# Patient Record
Sex: Female | Born: 1940 | Race: White | Hispanic: No
Health system: Southern US, Community
[De-identification: ages and names within clinical notes are randomized; demographics above are authoritative.]

## PROBLEM LIST (undated history)

## (undated) DIAGNOSIS — F329 Major depressive disorder, single episode, unspecified: Secondary | ICD-10-CM

## (undated) DIAGNOSIS — G2401 Drug induced subacute dyskinesia: Secondary | ICD-10-CM

## (undated) DIAGNOSIS — F603 Borderline personality disorder: Secondary | ICD-10-CM

## (undated) DIAGNOSIS — I2510 Atherosclerotic heart disease of native coronary artery without angina pectoris: Secondary | ICD-10-CM

## (undated) DIAGNOSIS — I2699 Other pulmonary embolism without acute cor pulmonale: Secondary | ICD-10-CM

## (undated) DIAGNOSIS — I82409 Acute embolism and thrombosis of unspecified deep veins of unspecified lower extremity: Secondary | ICD-10-CM

## (undated) DIAGNOSIS — R0602 Shortness of breath: Secondary | ICD-10-CM

## (undated) DIAGNOSIS — F419 Anxiety disorder, unspecified: Secondary | ICD-10-CM

## (undated) DIAGNOSIS — G629 Polyneuropathy, unspecified: Secondary | ICD-10-CM

## (undated) DIAGNOSIS — Z87448 Personal history of other diseases of urinary system: Secondary | ICD-10-CM

## (undated) DIAGNOSIS — D649 Anemia, unspecified: Secondary | ICD-10-CM

## (undated) DIAGNOSIS — M1990 Unspecified osteoarthritis, unspecified site: Secondary | ICD-10-CM

## (undated) DIAGNOSIS — I10 Essential (primary) hypertension: Secondary | ICD-10-CM

## (undated) DIAGNOSIS — J449 Chronic obstructive pulmonary disease, unspecified: Secondary | ICD-10-CM

## (undated) DIAGNOSIS — F32A Depression, unspecified: Secondary | ICD-10-CM

## (undated) DIAGNOSIS — K219 Gastro-esophageal reflux disease without esophagitis: Secondary | ICD-10-CM

## (undated) DIAGNOSIS — E669 Obesity, unspecified: Secondary | ICD-10-CM

## (undated) HISTORY — PX: TONSILLECTOMY: SUR1361

## (undated) HISTORY — PX: APPENDECTOMY: SHX54

## (undated) HISTORY — DX: Polyneuropathy, unspecified: G62.9

## (undated) HISTORY — DX: Other pulmonary embolism without acute cor pulmonale: I26.99

## (undated) HISTORY — DX: Acute embolism and thrombosis of unspecified deep veins of unspecified lower extremity: I82.409

## (undated) HISTORY — PX: ORIF ANKLE FRACTURE: SUR919

## (undated) HISTORY — PX: CERVICAL DISCECTOMY: SHX98

---

## 1998-01-18 ENCOUNTER — Encounter: Admission: RE | Admit: 1998-01-18 | Discharge: 1998-01-18 | Payer: Self-pay | Admitting: Family Medicine

## 1998-01-31 ENCOUNTER — Encounter: Admission: RE | Admit: 1998-01-31 | Discharge: 1998-01-31 | Payer: Self-pay | Admitting: Family Medicine

## 1998-02-09 ENCOUNTER — Encounter: Admission: RE | Admit: 1998-02-09 | Discharge: 1998-02-09 | Payer: Self-pay | Admitting: Sports Medicine

## 1998-02-28 ENCOUNTER — Encounter: Admission: RE | Admit: 1998-02-28 | Discharge: 1998-02-28 | Payer: Self-pay | Admitting: Family Medicine

## 1998-03-02 ENCOUNTER — Encounter: Admission: RE | Admit: 1998-03-02 | Discharge: 1998-03-02 | Payer: Self-pay | Admitting: Family Medicine

## 1998-03-27 ENCOUNTER — Encounter: Admission: RE | Admit: 1998-03-27 | Discharge: 1998-03-27 | Payer: Self-pay | Admitting: Family Medicine

## 1998-04-03 ENCOUNTER — Encounter: Admission: RE | Admit: 1998-04-03 | Discharge: 1998-04-03 | Payer: Self-pay | Admitting: Family Medicine

## 1998-04-24 ENCOUNTER — Encounter: Admission: RE | Admit: 1998-04-24 | Discharge: 1998-04-24 | Payer: Self-pay | Admitting: Family Medicine

## 1998-05-01 ENCOUNTER — Encounter: Admission: RE | Admit: 1998-05-01 | Discharge: 1998-05-01 | Payer: Self-pay | Admitting: Family Medicine

## 1998-05-22 ENCOUNTER — Encounter: Admission: RE | Admit: 1998-05-22 | Discharge: 1998-05-22 | Payer: Self-pay | Admitting: Family Medicine

## 1998-05-22 ENCOUNTER — Ambulatory Visit (HOSPITAL_COMMUNITY): Admission: RE | Admit: 1998-05-22 | Discharge: 1998-05-22 | Payer: Self-pay | Admitting: *Deleted

## 1998-05-26 ENCOUNTER — Encounter: Admission: RE | Admit: 1998-05-26 | Discharge: 1998-05-26 | Payer: Self-pay | Admitting: Family Medicine

## 1998-05-29 ENCOUNTER — Encounter: Admission: RE | Admit: 1998-05-29 | Discharge: 1998-05-29 | Payer: Self-pay | Admitting: Sports Medicine

## 1998-05-29 ENCOUNTER — Emergency Department (HOSPITAL_COMMUNITY): Admission: EM | Admit: 1998-05-29 | Discharge: 1998-05-29 | Payer: Self-pay | Admitting: *Deleted

## 1998-05-29 ENCOUNTER — Encounter: Payer: Self-pay | Admitting: *Deleted

## 1998-05-29 ENCOUNTER — Emergency Department (HOSPITAL_COMMUNITY): Admission: EM | Admit: 1998-05-29 | Discharge: 1998-05-29 | Payer: Self-pay | Admitting: Emergency Medicine

## 1998-05-30 ENCOUNTER — Inpatient Hospital Stay (HOSPITAL_COMMUNITY): Admission: EM | Admit: 1998-05-30 | Discharge: 1998-06-01 | Payer: Self-pay | Admitting: Emergency Medicine

## 1998-06-01 ENCOUNTER — Inpatient Hospital Stay (HOSPITAL_COMMUNITY): Admission: AD | Admit: 1998-06-01 | Discharge: 1998-06-09 | Payer: Self-pay | Admitting: *Deleted

## 1998-06-15 ENCOUNTER — Encounter: Admission: RE | Admit: 1998-06-15 | Discharge: 1998-06-15 | Payer: Self-pay | Admitting: Family Medicine

## 1998-08-03 ENCOUNTER — Encounter: Admission: RE | Admit: 1998-08-03 | Discharge: 1998-08-03 | Payer: Self-pay | Admitting: Family Medicine

## 1998-08-15 ENCOUNTER — Encounter: Admission: RE | Admit: 1998-08-15 | Discharge: 1998-08-15 | Payer: Self-pay | Admitting: Sports Medicine

## 1998-08-30 ENCOUNTER — Encounter: Admission: RE | Admit: 1998-08-30 | Discharge: 1998-08-30 | Payer: Self-pay | Admitting: Family Medicine

## 1998-09-21 ENCOUNTER — Encounter: Admission: RE | Admit: 1998-09-21 | Discharge: 1998-09-21 | Payer: Self-pay | Admitting: Family Medicine

## 1998-10-10 ENCOUNTER — Encounter: Admission: RE | Admit: 1998-10-10 | Discharge: 1998-10-10 | Payer: Self-pay | Admitting: Family Medicine

## 1998-11-01 ENCOUNTER — Encounter: Admission: RE | Admit: 1998-11-01 | Discharge: 1998-11-01 | Payer: Self-pay | Admitting: Family Medicine

## 1998-11-27 ENCOUNTER — Encounter: Admission: RE | Admit: 1998-11-27 | Discharge: 1998-11-27 | Payer: Self-pay | Admitting: Family Medicine

## 1998-12-11 ENCOUNTER — Encounter: Admission: RE | Admit: 1998-12-11 | Discharge: 1998-12-11 | Payer: Self-pay | Admitting: Family Medicine

## 1999-01-01 ENCOUNTER — Encounter: Admission: RE | Admit: 1999-01-01 | Discharge: 1999-01-01 | Payer: Self-pay | Admitting: Sports Medicine

## 1999-01-15 ENCOUNTER — Encounter: Admission: RE | Admit: 1999-01-15 | Discharge: 1999-01-15 | Payer: Self-pay | Admitting: Family Medicine

## 1999-02-06 ENCOUNTER — Encounter: Admission: RE | Admit: 1999-02-06 | Discharge: 1999-02-06 | Payer: Self-pay | Admitting: Family Medicine

## 1999-02-28 ENCOUNTER — Encounter: Admission: RE | Admit: 1999-02-28 | Discharge: 1999-02-28 | Payer: Self-pay | Admitting: Family Medicine

## 1999-03-23 ENCOUNTER — Encounter: Admission: RE | Admit: 1999-03-23 | Discharge: 1999-03-23 | Payer: Self-pay | Admitting: Family Medicine

## 1999-04-09 ENCOUNTER — Encounter: Admission: RE | Admit: 1999-04-09 | Discharge: 1999-04-09 | Payer: Self-pay | Admitting: Family Medicine

## 1999-04-30 ENCOUNTER — Encounter: Admission: RE | Admit: 1999-04-30 | Discharge: 1999-04-30 | Payer: Self-pay | Admitting: Family Medicine

## 1999-05-21 ENCOUNTER — Encounter: Admission: RE | Admit: 1999-05-21 | Discharge: 1999-05-21 | Payer: Self-pay | Admitting: Family Medicine

## 1999-06-11 ENCOUNTER — Encounter: Admission: RE | Admit: 1999-06-11 | Discharge: 1999-06-11 | Payer: Self-pay | Admitting: Family Medicine

## 1999-06-27 ENCOUNTER — Encounter: Admission: RE | Admit: 1999-06-27 | Discharge: 1999-06-27 | Payer: Self-pay | Admitting: Family Medicine

## 1999-07-11 ENCOUNTER — Encounter: Admission: RE | Admit: 1999-07-11 | Discharge: 1999-07-11 | Payer: Self-pay | Admitting: Family Medicine

## 1999-08-01 ENCOUNTER — Encounter: Admission: RE | Admit: 1999-08-01 | Discharge: 1999-08-01 | Payer: Self-pay | Admitting: Family Medicine

## 1999-08-24 ENCOUNTER — Encounter: Admission: RE | Admit: 1999-08-24 | Discharge: 1999-08-24 | Payer: Self-pay | Admitting: Family Medicine

## 1999-09-11 ENCOUNTER — Encounter: Admission: RE | Admit: 1999-09-11 | Discharge: 1999-09-11 | Payer: Self-pay | Admitting: Family Medicine

## 1999-09-19 ENCOUNTER — Encounter: Admission: RE | Admit: 1999-09-19 | Discharge: 1999-09-19 | Payer: Self-pay | Admitting: Family Medicine

## 1999-10-10 ENCOUNTER — Encounter: Admission: RE | Admit: 1999-10-10 | Discharge: 1999-10-10 | Payer: Self-pay | Admitting: Sports Medicine

## 1999-10-10 ENCOUNTER — Encounter: Payer: Self-pay | Admitting: Sports Medicine

## 1999-10-17 ENCOUNTER — Encounter: Admission: RE | Admit: 1999-10-17 | Discharge: 1999-10-17 | Payer: Self-pay | Admitting: Family Medicine

## 1999-10-23 ENCOUNTER — Encounter: Admission: RE | Admit: 1999-10-23 | Discharge: 1999-10-23 | Payer: Self-pay | Admitting: Sports Medicine

## 1999-10-29 ENCOUNTER — Encounter: Admission: RE | Admit: 1999-10-29 | Discharge: 1999-10-29 | Payer: Self-pay | Admitting: Family Medicine

## 1999-11-06 ENCOUNTER — Encounter: Admission: RE | Admit: 1999-11-06 | Discharge: 1999-11-06 | Payer: Self-pay | Admitting: Sports Medicine

## 1999-11-21 ENCOUNTER — Encounter: Admission: RE | Admit: 1999-11-21 | Discharge: 1999-11-21 | Payer: Self-pay | Admitting: Family Medicine

## 1999-11-27 ENCOUNTER — Encounter: Admission: RE | Admit: 1999-11-27 | Discharge: 1999-11-27 | Payer: Self-pay | Admitting: Sports Medicine

## 2000-01-17 ENCOUNTER — Encounter: Admission: RE | Admit: 2000-01-17 | Discharge: 2000-01-17 | Payer: Self-pay | Admitting: Family Medicine

## 2000-02-01 ENCOUNTER — Encounter: Admission: RE | Admit: 2000-02-01 | Discharge: 2000-02-01 | Payer: Self-pay | Admitting: Family Medicine

## 2000-02-21 ENCOUNTER — Encounter: Admission: RE | Admit: 2000-02-21 | Discharge: 2000-02-21 | Payer: Self-pay | Admitting: Family Medicine

## 2000-02-21 ENCOUNTER — Encounter: Admission: RE | Admit: 2000-02-21 | Discharge: 2000-02-21 | Payer: Self-pay | Admitting: Sports Medicine

## 2000-02-21 ENCOUNTER — Encounter: Payer: Self-pay | Admitting: Sports Medicine

## 2000-02-22 ENCOUNTER — Encounter: Admission: RE | Admit: 2000-02-22 | Discharge: 2000-02-22 | Payer: Self-pay | Admitting: Family Medicine

## 2000-03-14 ENCOUNTER — Encounter: Admission: RE | Admit: 2000-03-14 | Discharge: 2000-03-14 | Payer: Self-pay | Admitting: Family Medicine

## 2000-04-03 ENCOUNTER — Encounter: Admission: RE | Admit: 2000-04-03 | Discharge: 2000-04-03 | Payer: Self-pay | Admitting: Family Medicine

## 2000-04-24 ENCOUNTER — Encounter: Admission: RE | Admit: 2000-04-24 | Discharge: 2000-04-24 | Payer: Self-pay | Admitting: Family Medicine

## 2000-05-19 ENCOUNTER — Encounter: Admission: RE | Admit: 2000-05-19 | Discharge: 2000-05-19 | Payer: Self-pay | Admitting: Family Medicine

## 2000-06-09 ENCOUNTER — Encounter: Admission: RE | Admit: 2000-06-09 | Discharge: 2000-06-09 | Payer: Self-pay | Admitting: Sports Medicine

## 2000-06-14 ENCOUNTER — Encounter (INDEPENDENT_AMBULATORY_CARE_PROVIDER_SITE_OTHER): Payer: Self-pay | Admitting: *Deleted

## 2000-06-14 LAB — CONVERTED CEMR LAB

## 2000-06-24 ENCOUNTER — Encounter: Admission: RE | Admit: 2000-06-24 | Discharge: 2000-06-24 | Payer: Self-pay | Admitting: Family Medicine

## 2000-07-15 ENCOUNTER — Encounter: Admission: RE | Admit: 2000-07-15 | Discharge: 2000-07-15 | Payer: Self-pay | Admitting: *Deleted

## 2000-07-15 ENCOUNTER — Encounter: Payer: Self-pay | Admitting: *Deleted

## 2000-07-15 ENCOUNTER — Encounter: Admission: RE | Admit: 2000-07-15 | Discharge: 2000-07-15 | Payer: Self-pay | Admitting: Family Medicine

## 2000-07-22 ENCOUNTER — Encounter: Admission: RE | Admit: 2000-07-22 | Discharge: 2000-07-22 | Payer: Self-pay | Admitting: Family Medicine

## 2000-08-01 ENCOUNTER — Encounter: Admission: RE | Admit: 2000-08-01 | Discharge: 2000-08-01 | Payer: Self-pay | Admitting: Family Medicine

## 2000-08-14 ENCOUNTER — Encounter: Admission: RE | Admit: 2000-08-14 | Discharge: 2000-08-14 | Payer: Self-pay | Admitting: Family Medicine

## 2000-08-28 ENCOUNTER — Encounter: Admission: RE | Admit: 2000-08-28 | Discharge: 2000-08-28 | Payer: Self-pay | Admitting: Family Medicine

## 2000-09-11 ENCOUNTER — Encounter: Admission: RE | Admit: 2000-09-11 | Discharge: 2000-09-11 | Payer: Self-pay | Admitting: Family Medicine

## 2000-09-25 ENCOUNTER — Encounter: Admission: RE | Admit: 2000-09-25 | Discharge: 2000-09-25 | Payer: Self-pay | Admitting: Family Medicine

## 2000-10-10 ENCOUNTER — Encounter: Admission: RE | Admit: 2000-10-10 | Discharge: 2000-10-10 | Payer: Self-pay | Admitting: Family Medicine

## 2000-10-13 ENCOUNTER — Encounter: Admission: RE | Admit: 2000-10-13 | Discharge: 2000-10-13 | Payer: Self-pay | Admitting: *Deleted

## 2000-10-31 ENCOUNTER — Encounter: Admission: RE | Admit: 2000-10-31 | Discharge: 2000-10-31 | Payer: Self-pay | Admitting: Family Medicine

## 2000-10-31 ENCOUNTER — Ambulatory Visit (HOSPITAL_COMMUNITY): Admission: RE | Admit: 2000-10-31 | Discharge: 2000-10-31 | Payer: Self-pay | Admitting: Family Medicine

## 2000-11-12 ENCOUNTER — Encounter: Admission: RE | Admit: 2000-11-12 | Discharge: 2000-11-12 | Payer: Self-pay | Admitting: Family Medicine

## 2000-11-27 ENCOUNTER — Encounter: Admission: RE | Admit: 2000-11-27 | Discharge: 2000-11-27 | Payer: Self-pay | Admitting: Family Medicine

## 2000-12-08 ENCOUNTER — Encounter: Admission: RE | Admit: 2000-12-08 | Discharge: 2000-12-08 | Payer: Self-pay | Admitting: *Deleted

## 2000-12-08 ENCOUNTER — Encounter: Admission: RE | Admit: 2000-12-08 | Discharge: 2000-12-08 | Payer: Self-pay | Admitting: Family Medicine

## 2000-12-10 ENCOUNTER — Encounter: Admission: RE | Admit: 2000-12-10 | Discharge: 2000-12-10 | Payer: Self-pay | Admitting: Family Medicine

## 2000-12-19 ENCOUNTER — Encounter: Admission: RE | Admit: 2000-12-19 | Discharge: 2000-12-19 | Payer: Self-pay | Admitting: Family Medicine

## 2001-01-08 ENCOUNTER — Encounter: Admission: RE | Admit: 2001-01-08 | Discharge: 2001-01-08 | Payer: Self-pay | Admitting: Family Medicine

## 2001-01-13 ENCOUNTER — Encounter: Admission: RE | Admit: 2001-01-13 | Discharge: 2001-01-13 | Payer: Self-pay | Admitting: Family Medicine

## 2001-01-21 ENCOUNTER — Encounter: Admission: RE | Admit: 2001-01-21 | Discharge: 2001-01-21 | Payer: Self-pay | Admitting: Family Medicine

## 2001-02-03 ENCOUNTER — Encounter: Admission: RE | Admit: 2001-02-03 | Discharge: 2001-02-03 | Payer: Self-pay | Admitting: Family Medicine

## 2001-02-05 ENCOUNTER — Encounter: Admission: RE | Admit: 2001-02-05 | Discharge: 2001-02-05 | Payer: Self-pay | Admitting: Family Medicine

## 2001-02-24 ENCOUNTER — Encounter: Admission: RE | Admit: 2001-02-24 | Discharge: 2001-02-24 | Payer: Self-pay | Admitting: Family Medicine

## 2001-03-17 ENCOUNTER — Encounter: Admission: RE | Admit: 2001-03-17 | Discharge: 2001-03-17 | Payer: Self-pay | Admitting: Family Medicine

## 2001-03-18 ENCOUNTER — Encounter: Admission: RE | Admit: 2001-03-18 | Discharge: 2001-03-18 | Payer: Self-pay | Admitting: Family Medicine

## 2001-03-30 ENCOUNTER — Encounter: Admission: RE | Admit: 2001-03-30 | Discharge: 2001-03-30 | Payer: Self-pay | Admitting: Family Medicine

## 2001-04-01 ENCOUNTER — Encounter: Admission: RE | Admit: 2001-04-01 | Discharge: 2001-04-01 | Payer: Self-pay | Admitting: Family Medicine

## 2001-04-06 ENCOUNTER — Encounter: Admission: RE | Admit: 2001-04-06 | Discharge: 2001-04-06 | Payer: Self-pay | Admitting: Family Medicine

## 2001-04-07 ENCOUNTER — Encounter: Admission: RE | Admit: 2001-04-07 | Discharge: 2001-04-07 | Payer: Self-pay | Admitting: Family Medicine

## 2001-04-22 ENCOUNTER — Encounter: Admission: RE | Admit: 2001-04-22 | Discharge: 2001-04-22 | Payer: Self-pay | Admitting: Family Medicine

## 2001-04-23 ENCOUNTER — Encounter: Admission: RE | Admit: 2001-04-23 | Discharge: 2001-04-23 | Payer: Self-pay | Admitting: Family Medicine

## 2001-05-07 ENCOUNTER — Encounter: Admission: RE | Admit: 2001-05-07 | Discharge: 2001-05-07 | Payer: Self-pay | Admitting: Family Medicine

## 2001-05-12 ENCOUNTER — Encounter: Admission: RE | Admit: 2001-05-12 | Discharge: 2001-05-12 | Payer: Self-pay | Admitting: Sports Medicine

## 2001-05-26 ENCOUNTER — Encounter: Admission: RE | Admit: 2001-05-26 | Discharge: 2001-05-26 | Payer: Self-pay | Admitting: Family Medicine

## 2001-05-28 ENCOUNTER — Encounter: Admission: RE | Admit: 2001-05-28 | Discharge: 2001-05-28 | Payer: Self-pay | Admitting: Family Medicine

## 2001-06-09 ENCOUNTER — Encounter: Admission: RE | Admit: 2001-06-09 | Discharge: 2001-06-09 | Payer: Self-pay | Admitting: Family Medicine

## 2001-06-10 ENCOUNTER — Encounter: Admission: RE | Admit: 2001-06-10 | Discharge: 2001-06-10 | Payer: Self-pay | Admitting: Family Medicine

## 2001-06-25 ENCOUNTER — Encounter: Admission: RE | Admit: 2001-06-25 | Discharge: 2001-06-25 | Payer: Self-pay | Admitting: Family Medicine

## 2001-07-07 ENCOUNTER — Encounter: Admission: RE | Admit: 2001-07-07 | Discharge: 2001-07-07 | Payer: Self-pay | Admitting: Sports Medicine

## 2001-07-07 ENCOUNTER — Ambulatory Visit (HOSPITAL_COMMUNITY): Admission: RE | Admit: 2001-07-07 | Discharge: 2001-07-07 | Payer: Self-pay | Admitting: Gastroenterology

## 2001-07-14 ENCOUNTER — Encounter: Admission: RE | Admit: 2001-07-14 | Discharge: 2001-07-14 | Payer: Self-pay | Admitting: Family Medicine

## 2001-07-23 ENCOUNTER — Encounter: Admission: RE | Admit: 2001-07-23 | Discharge: 2001-07-23 | Payer: Self-pay | Admitting: Family Medicine

## 2001-07-30 ENCOUNTER — Encounter: Admission: RE | Admit: 2001-07-30 | Discharge: 2001-07-30 | Payer: Self-pay | Admitting: Family Medicine

## 2001-08-06 ENCOUNTER — Encounter: Admission: RE | Admit: 2001-08-06 | Discharge: 2001-08-06 | Payer: Self-pay | Admitting: Family Medicine

## 2001-08-11 ENCOUNTER — Encounter: Admission: RE | Admit: 2001-08-11 | Discharge: 2001-08-11 | Payer: Self-pay | Admitting: Family Medicine

## 2001-08-20 ENCOUNTER — Encounter: Admission: RE | Admit: 2001-08-20 | Discharge: 2001-08-20 | Payer: Self-pay | Admitting: Family Medicine

## 2001-08-27 ENCOUNTER — Encounter: Admission: RE | Admit: 2001-08-27 | Discharge: 2001-08-27 | Payer: Self-pay | Admitting: Family Medicine

## 2001-09-02 ENCOUNTER — Encounter: Admission: RE | Admit: 2001-09-02 | Discharge: 2001-09-02 | Payer: Self-pay | Admitting: Family Medicine

## 2001-09-08 ENCOUNTER — Encounter: Admission: RE | Admit: 2001-09-08 | Discharge: 2001-09-08 | Payer: Self-pay | Admitting: Sports Medicine

## 2001-09-24 ENCOUNTER — Encounter: Admission: RE | Admit: 2001-09-24 | Discharge: 2001-09-24 | Payer: Self-pay | Admitting: Family Medicine

## 2001-09-29 ENCOUNTER — Encounter: Admission: RE | Admit: 2001-09-29 | Discharge: 2001-09-29 | Payer: Self-pay | Admitting: Family Medicine

## 2001-10-08 ENCOUNTER — Encounter: Admission: RE | Admit: 2001-10-08 | Discharge: 2001-10-08 | Payer: Self-pay | Admitting: Family Medicine

## 2001-10-20 ENCOUNTER — Encounter: Admission: RE | Admit: 2001-10-20 | Discharge: 2001-10-20 | Payer: Self-pay | Admitting: Family Medicine

## 2001-10-22 ENCOUNTER — Encounter: Admission: RE | Admit: 2001-10-22 | Discharge: 2001-10-22 | Payer: Self-pay | Admitting: Family Medicine

## 2001-10-27 ENCOUNTER — Encounter: Admission: RE | Admit: 2001-10-27 | Discharge: 2001-10-27 | Payer: Self-pay | Admitting: Family Medicine

## 2001-11-04 ENCOUNTER — Encounter: Admission: RE | Admit: 2001-11-04 | Discharge: 2001-11-04 | Payer: Self-pay | Admitting: Family Medicine

## 2001-11-12 ENCOUNTER — Encounter: Admission: RE | Admit: 2001-11-12 | Discharge: 2001-11-12 | Payer: Self-pay | Admitting: Family Medicine

## 2001-11-19 ENCOUNTER — Encounter: Admission: RE | Admit: 2001-11-19 | Discharge: 2001-11-19 | Payer: Self-pay | Admitting: Family Medicine

## 2001-11-24 ENCOUNTER — Encounter: Admission: RE | Admit: 2001-11-24 | Discharge: 2001-11-24 | Payer: Self-pay | Admitting: Family Medicine

## 2001-12-08 ENCOUNTER — Encounter: Admission: RE | Admit: 2001-12-08 | Discharge: 2001-12-08 | Payer: Self-pay | Admitting: Family Medicine

## 2001-12-22 ENCOUNTER — Encounter: Admission: RE | Admit: 2001-12-22 | Discharge: 2001-12-22 | Payer: Self-pay | Admitting: Family Medicine

## 2001-12-23 ENCOUNTER — Encounter: Admission: RE | Admit: 2001-12-23 | Discharge: 2001-12-23 | Payer: Self-pay | Admitting: *Deleted

## 2001-12-23 ENCOUNTER — Encounter: Payer: Self-pay | Admitting: *Deleted

## 2001-12-24 ENCOUNTER — Encounter: Admission: RE | Admit: 2001-12-24 | Discharge: 2001-12-24 | Payer: Self-pay | Admitting: Family Medicine

## 2002-01-01 ENCOUNTER — Encounter: Admission: RE | Admit: 2002-01-01 | Discharge: 2002-01-01 | Payer: Self-pay | Admitting: Family Medicine

## 2002-01-05 ENCOUNTER — Encounter: Admission: RE | Admit: 2002-01-05 | Discharge: 2002-01-05 | Payer: Self-pay | Admitting: Family Medicine

## 2002-01-19 ENCOUNTER — Encounter: Admission: RE | Admit: 2002-01-19 | Discharge: 2002-01-19 | Payer: Self-pay | Admitting: Family Medicine

## 2002-02-01 ENCOUNTER — Encounter: Admission: RE | Admit: 2002-02-01 | Discharge: 2002-02-01 | Payer: Self-pay | Admitting: Family Medicine

## 2002-02-03 ENCOUNTER — Encounter: Admission: RE | Admit: 2002-02-03 | Discharge: 2002-02-03 | Payer: Self-pay | Admitting: Family Medicine

## 2002-02-04 ENCOUNTER — Encounter: Admission: RE | Admit: 2002-02-04 | Discharge: 2002-02-04 | Payer: Self-pay | Admitting: Sports Medicine

## 2002-02-10 ENCOUNTER — Encounter: Admission: RE | Admit: 2002-02-10 | Discharge: 2002-02-10 | Payer: Self-pay | Admitting: Family Medicine

## 2002-02-11 ENCOUNTER — Encounter: Payer: Self-pay | Admitting: Sports Medicine

## 2002-02-11 ENCOUNTER — Encounter: Admission: RE | Admit: 2002-02-11 | Discharge: 2002-02-11 | Payer: Self-pay | Admitting: Sports Medicine

## 2002-02-19 ENCOUNTER — Emergency Department (HOSPITAL_COMMUNITY): Admission: EM | Admit: 2002-02-19 | Discharge: 2002-02-19 | Payer: Self-pay | Admitting: Emergency Medicine

## 2002-02-19 ENCOUNTER — Encounter: Payer: Self-pay | Admitting: Emergency Medicine

## 2002-02-21 ENCOUNTER — Emergency Department (HOSPITAL_COMMUNITY): Admission: EM | Admit: 2002-02-21 | Discharge: 2002-02-21 | Payer: Self-pay | Admitting: Emergency Medicine

## 2002-03-04 ENCOUNTER — Encounter: Admission: RE | Admit: 2002-03-04 | Discharge: 2002-03-04 | Payer: Self-pay | Admitting: Family Medicine

## 2002-03-06 ENCOUNTER — Encounter: Payer: Self-pay | Admitting: Emergency Medicine

## 2002-03-06 ENCOUNTER — Inpatient Hospital Stay (HOSPITAL_COMMUNITY): Admission: EM | Admit: 2002-03-06 | Discharge: 2002-03-17 | Payer: Self-pay | Admitting: Emergency Medicine

## 2002-03-12 ENCOUNTER — Encounter: Payer: Self-pay | Admitting: Family Medicine

## 2002-03-17 ENCOUNTER — Inpatient Hospital Stay: Admission: AD | Admit: 2002-03-17 | Discharge: 2002-03-24 | Payer: Self-pay | Admitting: Family Medicine

## 2002-04-01 ENCOUNTER — Encounter: Admission: RE | Admit: 2002-04-01 | Discharge: 2002-04-01 | Payer: Self-pay | Admitting: Family Medicine

## 2002-04-15 ENCOUNTER — Encounter: Admission: RE | Admit: 2002-04-15 | Discharge: 2002-04-15 | Payer: Self-pay | Admitting: Family Medicine

## 2002-04-30 ENCOUNTER — Encounter: Admission: RE | Admit: 2002-04-30 | Discharge: 2002-04-30 | Payer: Self-pay | Admitting: Family Medicine

## 2002-05-03 ENCOUNTER — Encounter: Admission: RE | Admit: 2002-05-03 | Discharge: 2002-05-03 | Payer: Self-pay | Admitting: Family Medicine

## 2002-05-11 ENCOUNTER — Encounter: Admission: RE | Admit: 2002-05-11 | Discharge: 2002-05-11 | Payer: Self-pay | Admitting: Family Medicine

## 2002-05-20 ENCOUNTER — Encounter: Admission: RE | Admit: 2002-05-20 | Discharge: 2002-05-20 | Payer: Self-pay | Admitting: Family Medicine

## 2002-05-24 ENCOUNTER — Encounter: Admission: RE | Admit: 2002-05-24 | Discharge: 2002-05-24 | Payer: Self-pay | Admitting: Family Medicine

## 2002-06-02 ENCOUNTER — Encounter: Admission: RE | Admit: 2002-06-02 | Discharge: 2002-06-02 | Payer: Self-pay | Admitting: Family Medicine

## 2002-06-08 ENCOUNTER — Encounter: Admission: RE | Admit: 2002-06-08 | Discharge: 2002-06-08 | Payer: Self-pay | Admitting: Family Medicine

## 2002-06-23 ENCOUNTER — Encounter: Admission: RE | Admit: 2002-06-23 | Discharge: 2002-06-23 | Payer: Self-pay | Admitting: Family Medicine

## 2002-07-14 ENCOUNTER — Encounter: Admission: RE | Admit: 2002-07-14 | Discharge: 2002-07-14 | Payer: Self-pay | Admitting: Family Medicine

## 2002-07-22 ENCOUNTER — Encounter: Admission: RE | Admit: 2002-07-22 | Discharge: 2002-07-22 | Payer: Self-pay | Admitting: Family Medicine

## 2002-07-23 ENCOUNTER — Encounter: Admission: RE | Admit: 2002-07-23 | Discharge: 2002-07-23 | Payer: Self-pay | Admitting: Family Medicine

## 2002-08-02 ENCOUNTER — Encounter: Admission: RE | Admit: 2002-08-02 | Discharge: 2002-08-02 | Payer: Self-pay | Admitting: Family Medicine

## 2002-08-05 ENCOUNTER — Encounter: Admission: RE | Admit: 2002-08-05 | Discharge: 2002-08-05 | Payer: Self-pay | Admitting: Family Medicine

## 2002-08-19 ENCOUNTER — Encounter: Admission: RE | Admit: 2002-08-19 | Discharge: 2002-08-19 | Payer: Self-pay | Admitting: Family Medicine

## 2002-08-23 ENCOUNTER — Encounter: Admission: RE | Admit: 2002-08-23 | Discharge: 2002-08-23 | Payer: Self-pay | Admitting: Family Medicine

## 2002-09-02 ENCOUNTER — Encounter: Admission: RE | Admit: 2002-09-02 | Discharge: 2002-09-02 | Payer: Self-pay | Admitting: Family Medicine

## 2002-09-13 ENCOUNTER — Encounter: Admission: RE | Admit: 2002-09-13 | Discharge: 2002-09-13 | Payer: Self-pay | Admitting: Family Medicine

## 2002-09-16 ENCOUNTER — Encounter: Admission: RE | Admit: 2002-09-16 | Discharge: 2002-09-16 | Payer: Self-pay | Admitting: Family Medicine

## 2002-09-28 ENCOUNTER — Encounter: Admission: RE | Admit: 2002-09-28 | Discharge: 2002-09-28 | Payer: Self-pay | Admitting: Family Medicine

## 2002-09-30 ENCOUNTER — Encounter: Admission: RE | Admit: 2002-09-30 | Discharge: 2002-09-30 | Payer: Self-pay | Admitting: Sports Medicine

## 2002-10-04 ENCOUNTER — Encounter: Admission: RE | Admit: 2002-10-04 | Discharge: 2002-10-04 | Payer: Self-pay | Admitting: Family Medicine

## 2002-10-21 ENCOUNTER — Encounter: Admission: RE | Admit: 2002-10-21 | Discharge: 2002-10-21 | Payer: Self-pay | Admitting: Sports Medicine

## 2002-10-29 ENCOUNTER — Encounter: Admission: RE | Admit: 2002-10-29 | Discharge: 2002-10-29 | Payer: Self-pay | Admitting: Family Medicine

## 2002-11-04 ENCOUNTER — Encounter: Admission: RE | Admit: 2002-11-04 | Discharge: 2002-11-04 | Payer: Self-pay | Admitting: Sports Medicine

## 2002-11-24 ENCOUNTER — Encounter: Admission: RE | Admit: 2002-11-24 | Discharge: 2002-11-24 | Payer: Self-pay | Admitting: Sports Medicine

## 2002-12-15 ENCOUNTER — Encounter: Admission: RE | Admit: 2002-12-15 | Discharge: 2002-12-15 | Payer: Self-pay | Admitting: Sports Medicine

## 2002-12-16 ENCOUNTER — Encounter: Admission: RE | Admit: 2002-12-16 | Discharge: 2002-12-16 | Payer: Self-pay | Admitting: Family Medicine

## 2002-12-22 ENCOUNTER — Encounter: Admission: RE | Admit: 2002-12-22 | Discharge: 2002-12-22 | Payer: Self-pay | Admitting: Family Medicine

## 2003-01-05 ENCOUNTER — Encounter: Admission: RE | Admit: 2003-01-05 | Discharge: 2003-01-05 | Payer: Self-pay | Admitting: Sports Medicine

## 2003-01-12 ENCOUNTER — Encounter: Admission: RE | Admit: 2003-01-12 | Discharge: 2003-01-12 | Payer: Self-pay | Admitting: Family Medicine

## 2003-01-19 ENCOUNTER — Encounter: Admission: RE | Admit: 2003-01-19 | Discharge: 2003-01-19 | Payer: Self-pay | Admitting: Family Medicine

## 2003-01-20 ENCOUNTER — Encounter: Admission: RE | Admit: 2003-01-20 | Discharge: 2003-01-20 | Payer: Self-pay | Admitting: Sports Medicine

## 2003-01-25 ENCOUNTER — Encounter: Admission: RE | Admit: 2003-01-25 | Discharge: 2003-01-25 | Payer: Self-pay | Admitting: Sports Medicine

## 2003-02-07 ENCOUNTER — Encounter: Admission: RE | Admit: 2003-02-07 | Discharge: 2003-02-07 | Payer: Self-pay | Admitting: Family Medicine

## 2003-02-08 ENCOUNTER — Encounter: Admission: RE | Admit: 2003-02-08 | Discharge: 2003-02-08 | Payer: Self-pay | Admitting: Sports Medicine

## 2003-02-10 ENCOUNTER — Encounter: Admission: RE | Admit: 2003-02-10 | Discharge: 2003-02-10 | Payer: Self-pay | Admitting: Family Medicine

## 2003-02-16 ENCOUNTER — Encounter: Admission: RE | Admit: 2003-02-16 | Discharge: 2003-02-16 | Payer: Self-pay | Admitting: Sports Medicine

## 2003-02-24 ENCOUNTER — Encounter: Admission: RE | Admit: 2003-02-24 | Discharge: 2003-02-24 | Payer: Self-pay | Admitting: Neurology

## 2003-02-24 ENCOUNTER — Encounter: Payer: Self-pay | Admitting: Neurology

## 2003-02-28 ENCOUNTER — Encounter: Admission: RE | Admit: 2003-02-28 | Discharge: 2003-02-28 | Payer: Self-pay | Admitting: Family Medicine

## 2003-03-02 ENCOUNTER — Encounter: Admission: RE | Admit: 2003-03-02 | Discharge: 2003-03-02 | Payer: Self-pay | Admitting: Sports Medicine

## 2003-03-03 ENCOUNTER — Encounter: Admission: RE | Admit: 2003-03-03 | Discharge: 2003-03-03 | Payer: Self-pay | Admitting: Neurology

## 2003-03-03 ENCOUNTER — Encounter: Payer: Self-pay | Admitting: Neurology

## 2003-03-16 ENCOUNTER — Encounter: Admission: RE | Admit: 2003-03-16 | Discharge: 2003-03-16 | Payer: Self-pay | Admitting: Sports Medicine

## 2003-03-17 ENCOUNTER — Encounter: Admission: RE | Admit: 2003-03-17 | Discharge: 2003-03-17 | Payer: Self-pay | Admitting: Sports Medicine

## 2003-03-31 ENCOUNTER — Encounter: Admission: RE | Admit: 2003-03-31 | Discharge: 2003-03-31 | Payer: Self-pay | Admitting: Family Medicine

## 2003-04-04 ENCOUNTER — Encounter: Admission: RE | Admit: 2003-04-04 | Discharge: 2003-04-04 | Payer: Self-pay | Admitting: Family Medicine

## 2003-04-07 ENCOUNTER — Encounter: Payer: Self-pay | Admitting: Emergency Medicine

## 2003-04-07 ENCOUNTER — Encounter: Admission: RE | Admit: 2003-04-07 | Discharge: 2003-04-07 | Payer: Self-pay | Admitting: Family Medicine

## 2003-04-07 ENCOUNTER — Emergency Department (HOSPITAL_COMMUNITY): Admission: EM | Admit: 2003-04-07 | Discharge: 2003-04-07 | Payer: Self-pay | Admitting: Emergency Medicine

## 2003-04-12 ENCOUNTER — Encounter: Admission: RE | Admit: 2003-04-12 | Discharge: 2003-04-12 | Payer: Self-pay | Admitting: Sports Medicine

## 2003-04-21 ENCOUNTER — Encounter: Admission: RE | Admit: 2003-04-21 | Discharge: 2003-04-21 | Payer: Self-pay | Admitting: Family Medicine

## 2003-04-25 ENCOUNTER — Encounter: Payer: Self-pay | Admitting: Emergency Medicine

## 2003-04-25 ENCOUNTER — Inpatient Hospital Stay (HOSPITAL_COMMUNITY): Admission: EM | Admit: 2003-04-25 | Discharge: 2003-04-29 | Payer: Self-pay | Admitting: Emergency Medicine

## 2003-04-25 ENCOUNTER — Encounter: Payer: Self-pay | Admitting: Orthopedic Surgery

## 2003-04-26 ENCOUNTER — Encounter: Payer: Self-pay | Admitting: Orthopedic Surgery

## 2003-05-05 LAB — CONVERTED CEMR LAB: Pap Smear: NORMAL

## 2003-07-22 ENCOUNTER — Encounter: Payer: Self-pay | Admitting: Orthopedic Surgery

## 2003-07-26 ENCOUNTER — Ambulatory Visit (HOSPITAL_COMMUNITY): Admission: RE | Admit: 2003-07-26 | Discharge: 2003-07-26 | Payer: Self-pay | Admitting: Orthopedic Surgery

## 2003-08-12 ENCOUNTER — Encounter: Admission: RE | Admit: 2003-08-12 | Discharge: 2003-08-12 | Payer: Self-pay | Admitting: Family Medicine

## 2003-10-05 ENCOUNTER — Observation Stay (HOSPITAL_COMMUNITY): Admission: EM | Admit: 2003-10-05 | Discharge: 2003-10-06 | Payer: Self-pay | Admitting: Emergency Medicine

## 2003-11-27 ENCOUNTER — Emergency Department (HOSPITAL_COMMUNITY): Admission: EM | Admit: 2003-11-27 | Discharge: 2003-11-27 | Payer: Self-pay | Admitting: Emergency Medicine

## 2003-12-06 ENCOUNTER — Encounter: Admission: RE | Admit: 2003-12-06 | Discharge: 2003-12-06 | Payer: Self-pay | Admitting: Sports Medicine

## 2003-12-13 ENCOUNTER — Encounter: Admission: RE | Admit: 2003-12-13 | Discharge: 2003-12-13 | Payer: Self-pay | Admitting: Family Medicine

## 2004-01-03 ENCOUNTER — Encounter: Admission: RE | Admit: 2004-01-03 | Discharge: 2004-01-03 | Payer: Self-pay | Admitting: Family Medicine

## 2004-01-24 ENCOUNTER — Encounter: Admission: RE | Admit: 2004-01-24 | Discharge: 2004-01-24 | Payer: Self-pay | Admitting: Sports Medicine

## 2004-02-01 ENCOUNTER — Encounter: Admission: RE | Admit: 2004-02-01 | Discharge: 2004-02-01 | Payer: Self-pay | Admitting: Sports Medicine

## 2004-02-08 ENCOUNTER — Encounter: Admission: RE | Admit: 2004-02-08 | Discharge: 2004-02-08 | Payer: Self-pay | Admitting: Family Medicine

## 2004-03-15 ENCOUNTER — Ambulatory Visit (HOSPITAL_COMMUNITY): Admission: RE | Admit: 2004-03-15 | Discharge: 2004-03-15 | Payer: Self-pay | Admitting: Internal Medicine

## 2004-03-25 ENCOUNTER — Inpatient Hospital Stay (HOSPITAL_COMMUNITY): Admission: EM | Admit: 2004-03-25 | Discharge: 2004-03-28 | Payer: Self-pay

## 2004-03-26 ENCOUNTER — Encounter (INDEPENDENT_AMBULATORY_CARE_PROVIDER_SITE_OTHER): Payer: Self-pay | Admitting: Cardiology

## 2004-07-24 ENCOUNTER — Ambulatory Visit: Payer: Self-pay | Admitting: Sports Medicine

## 2004-07-30 ENCOUNTER — Ambulatory Visit: Payer: Self-pay | Admitting: Family Medicine

## 2004-08-07 ENCOUNTER — Ambulatory Visit: Payer: Self-pay | Admitting: Sports Medicine

## 2004-08-21 ENCOUNTER — Ambulatory Visit: Payer: Self-pay | Admitting: Sports Medicine

## 2004-08-29 ENCOUNTER — Ambulatory Visit: Payer: Self-pay | Admitting: Family Medicine

## 2004-09-04 ENCOUNTER — Ambulatory Visit: Payer: Self-pay | Admitting: Sports Medicine

## 2004-09-11 ENCOUNTER — Ambulatory Visit: Payer: Self-pay | Admitting: Sports Medicine

## 2004-09-18 ENCOUNTER — Ambulatory Visit: Payer: Self-pay | Admitting: Sports Medicine

## 2004-09-25 ENCOUNTER — Ambulatory Visit: Payer: Self-pay | Admitting: Sports Medicine

## 2004-10-02 ENCOUNTER — Ambulatory Visit: Payer: Self-pay | Admitting: Sports Medicine

## 2004-10-16 ENCOUNTER — Ambulatory Visit: Payer: Self-pay | Admitting: Sports Medicine

## 2004-10-24 ENCOUNTER — Ambulatory Visit: Payer: Self-pay | Admitting: Family Medicine

## 2004-10-31 ENCOUNTER — Emergency Department (HOSPITAL_COMMUNITY): Admission: EM | Admit: 2004-10-31 | Discharge: 2004-10-31 | Payer: Self-pay | Admitting: Emergency Medicine

## 2004-11-06 ENCOUNTER — Ambulatory Visit: Payer: Self-pay | Admitting: Sports Medicine

## 2004-11-13 ENCOUNTER — Ambulatory Visit: Payer: Self-pay | Admitting: Family Medicine

## 2004-11-20 ENCOUNTER — Ambulatory Visit: Payer: Self-pay | Admitting: Sports Medicine

## 2004-11-28 ENCOUNTER — Ambulatory Visit: Payer: Self-pay | Admitting: Family Medicine

## 2004-12-04 ENCOUNTER — Ambulatory Visit: Payer: Self-pay | Admitting: Sports Medicine

## 2004-12-11 ENCOUNTER — Ambulatory Visit: Payer: Self-pay | Admitting: Family Medicine

## 2004-12-18 ENCOUNTER — Ambulatory Visit: Payer: Self-pay | Admitting: Sports Medicine

## 2004-12-25 ENCOUNTER — Ambulatory Visit: Payer: Self-pay | Admitting: Family Medicine

## 2005-01-01 ENCOUNTER — Ambulatory Visit: Payer: Self-pay | Admitting: Sports Medicine

## 2005-01-07 ENCOUNTER — Ambulatory Visit (HOSPITAL_COMMUNITY): Admission: RE | Admit: 2005-01-07 | Discharge: 2005-01-07 | Payer: Self-pay | Admitting: Ophthalmology

## 2005-01-10 ENCOUNTER — Ambulatory Visit: Payer: Self-pay | Admitting: Family Medicine

## 2005-01-22 ENCOUNTER — Ambulatory Visit: Payer: Self-pay | Admitting: Sports Medicine

## 2005-02-04 ENCOUNTER — Ambulatory Visit (HOSPITAL_COMMUNITY): Admission: RE | Admit: 2005-02-04 | Discharge: 2005-02-05 | Payer: Self-pay | Admitting: Ophthalmology

## 2005-02-06 ENCOUNTER — Ambulatory Visit: Payer: Self-pay | Admitting: Sports Medicine

## 2005-02-15 ENCOUNTER — Ambulatory Visit: Payer: Self-pay | Admitting: Family Medicine

## 2005-02-20 ENCOUNTER — Ambulatory Visit: Payer: Self-pay | Admitting: Sports Medicine

## 2005-02-26 ENCOUNTER — Ambulatory Visit: Payer: Self-pay | Admitting: Family Medicine

## 2005-03-06 ENCOUNTER — Ambulatory Visit: Payer: Self-pay | Admitting: Sports Medicine

## 2005-03-14 ENCOUNTER — Ambulatory Visit: Payer: Self-pay | Admitting: Family Medicine

## 2005-03-19 ENCOUNTER — Ambulatory Visit: Payer: Self-pay | Admitting: Sports Medicine

## 2005-03-26 ENCOUNTER — Ambulatory Visit: Payer: Self-pay | Admitting: Sports Medicine

## 2005-04-02 ENCOUNTER — Ambulatory Visit: Payer: Self-pay | Admitting: Sports Medicine

## 2005-04-09 ENCOUNTER — Ambulatory Visit: Payer: Self-pay | Admitting: Family Medicine

## 2005-04-17 ENCOUNTER — Ambulatory Visit: Payer: Self-pay | Admitting: Sports Medicine

## 2005-04-23 ENCOUNTER — Ambulatory Visit: Payer: Self-pay | Admitting: Family Medicine

## 2005-04-24 ENCOUNTER — Ambulatory Visit (HOSPITAL_COMMUNITY): Admission: RE | Admit: 2005-04-24 | Discharge: 2005-04-24 | Payer: Self-pay | Admitting: Family Medicine

## 2005-05-01 ENCOUNTER — Ambulatory Visit: Payer: Self-pay | Admitting: Family Medicine

## 2005-05-06 ENCOUNTER — Ambulatory Visit: Payer: Self-pay | Admitting: Family Medicine

## 2005-05-14 ENCOUNTER — Ambulatory Visit: Payer: Self-pay | Admitting: Sports Medicine

## 2005-05-21 ENCOUNTER — Ambulatory Visit: Payer: Self-pay | Admitting: Family Medicine

## 2005-06-03 ENCOUNTER — Ambulatory Visit: Payer: Self-pay | Admitting: Family Medicine

## 2005-06-04 ENCOUNTER — Ambulatory Visit: Payer: Self-pay | Admitting: Sports Medicine

## 2005-06-18 ENCOUNTER — Ambulatory Visit: Payer: Self-pay | Admitting: Family Medicine

## 2005-07-02 ENCOUNTER — Ambulatory Visit: Payer: Self-pay | Admitting: Sports Medicine

## 2005-07-08 ENCOUNTER — Ambulatory Visit: Payer: Self-pay | Admitting: Family Medicine

## 2005-07-16 ENCOUNTER — Ambulatory Visit: Payer: Self-pay | Admitting: Sports Medicine

## 2005-07-23 ENCOUNTER — Encounter (INDEPENDENT_AMBULATORY_CARE_PROVIDER_SITE_OTHER): Payer: Self-pay | Admitting: Cardiology

## 2005-07-23 ENCOUNTER — Ambulatory Visit (HOSPITAL_COMMUNITY): Admission: RE | Admit: 2005-07-23 | Discharge: 2005-07-23 | Payer: Self-pay | Admitting: Sports Medicine

## 2005-07-23 ENCOUNTER — Ambulatory Visit: Payer: Self-pay | Admitting: Sports Medicine

## 2005-07-29 ENCOUNTER — Ambulatory Visit: Payer: Self-pay | Admitting: Family Medicine

## 2005-08-02 ENCOUNTER — Ambulatory Visit: Payer: Self-pay | Admitting: Sports Medicine

## 2005-08-05 ENCOUNTER — Ambulatory Visit: Payer: Self-pay | Admitting: Family Medicine

## 2005-08-13 ENCOUNTER — Ambulatory Visit: Payer: Self-pay | Admitting: Sports Medicine

## 2005-08-22 ENCOUNTER — Ambulatory Visit: Payer: Self-pay | Admitting: Family Medicine

## 2005-09-03 ENCOUNTER — Ambulatory Visit: Payer: Self-pay | Admitting: Sports Medicine

## 2005-09-09 ENCOUNTER — Ambulatory Visit: Payer: Self-pay | Admitting: Family Medicine

## 2005-09-10 ENCOUNTER — Ambulatory Visit: Payer: Self-pay | Admitting: Sports Medicine

## 2005-09-24 ENCOUNTER — Ambulatory Visit: Payer: Self-pay | Admitting: Family Medicine

## 2005-09-30 ENCOUNTER — Ambulatory Visit: Payer: Self-pay | Admitting: Family Medicine

## 2005-10-01 ENCOUNTER — Ambulatory Visit: Payer: Self-pay | Admitting: Sports Medicine

## 2005-10-09 ENCOUNTER — Ambulatory Visit: Payer: Self-pay | Admitting: Family Medicine

## 2005-10-15 ENCOUNTER — Ambulatory Visit: Payer: Self-pay | Admitting: Sports Medicine

## 2005-10-16 ENCOUNTER — Ambulatory Visit (HOSPITAL_COMMUNITY): Admission: RE | Admit: 2005-10-16 | Discharge: 2005-10-16 | Payer: Self-pay | Admitting: Sports Medicine

## 2005-10-17 ENCOUNTER — Ambulatory Visit: Payer: Self-pay | Admitting: Family Medicine

## 2005-10-22 ENCOUNTER — Ambulatory Visit: Payer: Self-pay | Admitting: Sports Medicine

## 2005-10-29 ENCOUNTER — Ambulatory Visit: Payer: Self-pay | Admitting: Family Medicine

## 2005-11-05 ENCOUNTER — Ambulatory Visit: Payer: Self-pay | Admitting: Sports Medicine

## 2005-11-05 ENCOUNTER — Ambulatory Visit (HOSPITAL_COMMUNITY): Admission: RE | Admit: 2005-11-05 | Discharge: 2005-11-05 | Payer: Self-pay | Admitting: Sports Medicine

## 2005-11-11 ENCOUNTER — Ambulatory Visit: Payer: Self-pay | Admitting: Family Medicine

## 2005-11-13 ENCOUNTER — Encounter: Admission: RE | Admit: 2005-11-13 | Discharge: 2005-11-13 | Payer: Self-pay | Admitting: Sports Medicine

## 2005-11-19 ENCOUNTER — Ambulatory Visit: Payer: Self-pay | Admitting: Sports Medicine

## 2005-11-25 ENCOUNTER — Ambulatory Visit: Payer: Self-pay | Admitting: Family Medicine

## 2005-12-03 ENCOUNTER — Ambulatory Visit: Payer: Self-pay | Admitting: Sports Medicine

## 2005-12-09 ENCOUNTER — Ambulatory Visit: Payer: Self-pay | Admitting: Family Medicine

## 2005-12-10 ENCOUNTER — Ambulatory Visit: Payer: Self-pay | Admitting: Sports Medicine

## 2005-12-23 ENCOUNTER — Ambulatory Visit: Payer: Self-pay | Admitting: Family Medicine

## 2005-12-27 ENCOUNTER — Ambulatory Visit: Payer: Self-pay | Admitting: Sports Medicine

## 2005-12-31 ENCOUNTER — Ambulatory Visit: Payer: Self-pay | Admitting: Sports Medicine

## 2006-01-06 ENCOUNTER — Ambulatory Visit: Payer: Self-pay | Admitting: Family Medicine

## 2006-01-07 ENCOUNTER — Ambulatory Visit: Payer: Self-pay | Admitting: Sports Medicine

## 2006-01-20 ENCOUNTER — Ambulatory Visit: Payer: Self-pay | Admitting: Family Medicine

## 2006-01-21 ENCOUNTER — Ambulatory Visit: Payer: Self-pay | Admitting: Sports Medicine

## 2006-01-24 ENCOUNTER — Emergency Department (HOSPITAL_COMMUNITY): Admission: EM | Admit: 2006-01-24 | Discharge: 2006-01-24 | Payer: Self-pay | Admitting: Emergency Medicine

## 2006-02-06 ENCOUNTER — Emergency Department (HOSPITAL_COMMUNITY): Admission: EM | Admit: 2006-02-06 | Discharge: 2006-02-06 | Payer: Self-pay | Admitting: Emergency Medicine

## 2006-03-11 ENCOUNTER — Ambulatory Visit: Payer: Self-pay | Admitting: Sports Medicine

## 2006-03-12 ENCOUNTER — Inpatient Hospital Stay (HOSPITAL_COMMUNITY): Admission: AD | Admit: 2006-03-12 | Discharge: 2006-03-20 | Payer: Self-pay | Admitting: Sports Medicine

## 2006-03-12 ENCOUNTER — Ambulatory Visit: Payer: Self-pay | Admitting: Sports Medicine

## 2006-04-04 ENCOUNTER — Ambulatory Visit: Payer: Self-pay | Admitting: Family Medicine

## 2006-04-04 ENCOUNTER — Inpatient Hospital Stay (HOSPITAL_COMMUNITY): Admission: EM | Admit: 2006-04-04 | Discharge: 2006-04-16 | Payer: Self-pay | Admitting: *Deleted

## 2006-04-14 ENCOUNTER — Encounter: Payer: Self-pay | Admitting: Vascular Surgery

## 2006-04-18 ENCOUNTER — Inpatient Hospital Stay (HOSPITAL_COMMUNITY): Admission: EM | Admit: 2006-04-18 | Discharge: 2006-05-02 | Payer: Self-pay | Admitting: Emergency Medicine

## 2006-04-18 ENCOUNTER — Ambulatory Visit: Payer: Self-pay | Admitting: Family Medicine

## 2006-04-25 ENCOUNTER — Ambulatory Visit: Payer: Self-pay | Admitting: Gastroenterology

## 2006-09-02 ENCOUNTER — Ambulatory Visit: Payer: Self-pay | Admitting: Family Medicine

## 2006-09-09 ENCOUNTER — Ambulatory Visit: Payer: Self-pay | Admitting: Sports Medicine

## 2006-09-23 ENCOUNTER — Ambulatory Visit: Payer: Self-pay | Admitting: Sports Medicine

## 2006-09-29 ENCOUNTER — Ambulatory Visit: Payer: Self-pay | Admitting: Family Medicine

## 2006-10-14 HISTORY — PX: US ECHOCARDIOGRAPHY: HXRAD669

## 2006-10-23 ENCOUNTER — Ambulatory Visit: Payer: Self-pay | Admitting: Family Medicine

## 2006-11-06 ENCOUNTER — Ambulatory Visit: Payer: Self-pay | Admitting: Family Medicine

## 2006-11-19 ENCOUNTER — Encounter: Admission: RE | Admit: 2006-11-19 | Discharge: 2006-12-30 | Payer: Self-pay | Admitting: Sports Medicine

## 2006-11-21 ENCOUNTER — Ambulatory Visit: Payer: Self-pay | Admitting: Family Medicine

## 2006-12-07 ENCOUNTER — Ambulatory Visit: Payer: Self-pay | Admitting: Family Medicine

## 2006-12-07 ENCOUNTER — Inpatient Hospital Stay (HOSPITAL_COMMUNITY): Admission: EM | Admit: 2006-12-07 | Discharge: 2006-12-12 | Payer: Self-pay | Admitting: *Deleted

## 2006-12-09 ENCOUNTER — Encounter (INDEPENDENT_AMBULATORY_CARE_PROVIDER_SITE_OTHER): Payer: Self-pay | Admitting: Cardiovascular Disease

## 2006-12-09 ENCOUNTER — Ambulatory Visit: Payer: Self-pay | Admitting: Vascular Surgery

## 2006-12-09 ENCOUNTER — Encounter: Payer: Self-pay | Admitting: Family Medicine

## 2006-12-11 DIAGNOSIS — G609 Hereditary and idiopathic neuropathy, unspecified: Secondary | ICD-10-CM | POA: Insufficient documentation

## 2006-12-11 DIAGNOSIS — E7800 Pure hypercholesterolemia, unspecified: Secondary | ICD-10-CM

## 2006-12-11 DIAGNOSIS — F329 Major depressive disorder, single episode, unspecified: Secondary | ICD-10-CM

## 2006-12-11 DIAGNOSIS — I80299 Phlebitis and thrombophlebitis of other deep vessels of unspecified lower extremity: Secondary | ICD-10-CM | POA: Insufficient documentation

## 2006-12-11 DIAGNOSIS — I10 Essential (primary) hypertension: Secondary | ICD-10-CM

## 2006-12-11 DIAGNOSIS — F603 Borderline personality disorder: Secondary | ICD-10-CM

## 2006-12-11 DIAGNOSIS — E1165 Type 2 diabetes mellitus with hyperglycemia: Secondary | ICD-10-CM | POA: Insufficient documentation

## 2006-12-11 DIAGNOSIS — E118 Type 2 diabetes mellitus with unspecified complications: Secondary | ICD-10-CM

## 2006-12-11 DIAGNOSIS — M959 Acquired deformity of musculoskeletal system, unspecified: Secondary | ICD-10-CM | POA: Insufficient documentation

## 2006-12-11 DIAGNOSIS — F3289 Other specified depressive episodes: Secondary | ICD-10-CM | POA: Insufficient documentation

## 2006-12-11 DIAGNOSIS — IMO0002 Reserved for concepts with insufficient information to code with codable children: Secondary | ICD-10-CM | POA: Insufficient documentation

## 2006-12-12 ENCOUNTER — Encounter (INDEPENDENT_AMBULATORY_CARE_PROVIDER_SITE_OTHER): Payer: Self-pay | Admitting: *Deleted

## 2006-12-16 ENCOUNTER — Telehealth: Payer: Self-pay | Admitting: *Deleted

## 2006-12-17 ENCOUNTER — Ambulatory Visit: Payer: Self-pay | Admitting: Family Medicine

## 2006-12-17 ENCOUNTER — Encounter (INDEPENDENT_AMBULATORY_CARE_PROVIDER_SITE_OTHER): Payer: Self-pay | Admitting: Sports Medicine

## 2006-12-17 LAB — CONVERTED CEMR LAB
CO2: 36 meq/L — ABNORMAL HIGH (ref 19–32)
Glucose, Bld: 214 mg/dL — ABNORMAL HIGH (ref 70–99)
Potassium: 3.6 meq/L (ref 3.5–5.3)
Sodium: 141 meq/L (ref 135–145)

## 2006-12-18 ENCOUNTER — Encounter (INDEPENDENT_AMBULATORY_CARE_PROVIDER_SITE_OTHER): Payer: Self-pay | Admitting: Sports Medicine

## 2006-12-18 ENCOUNTER — Telehealth (INDEPENDENT_AMBULATORY_CARE_PROVIDER_SITE_OTHER): Payer: Self-pay | Admitting: Sports Medicine

## 2006-12-19 ENCOUNTER — Telehealth: Payer: Self-pay | Admitting: *Deleted

## 2006-12-23 ENCOUNTER — Ambulatory Visit: Payer: Self-pay | Admitting: Sports Medicine

## 2006-12-23 ENCOUNTER — Encounter: Payer: Self-pay | Admitting: *Deleted

## 2006-12-26 ENCOUNTER — Telehealth (INDEPENDENT_AMBULATORY_CARE_PROVIDER_SITE_OTHER): Payer: Self-pay | Admitting: Sports Medicine

## 2006-12-29 ENCOUNTER — Encounter: Payer: Self-pay | Admitting: *Deleted

## 2007-01-02 ENCOUNTER — Telehealth: Payer: Self-pay | Admitting: *Deleted

## 2007-01-06 ENCOUNTER — Ambulatory Visit: Payer: Self-pay | Admitting: Sports Medicine

## 2007-01-06 LAB — CONVERTED CEMR LAB
AST: 19 units/L (ref 0–37)
Albumin: 3.3 g/dL — ABNORMAL LOW (ref 3.5–5.2)
Alkaline Phosphatase: 54 units/L (ref 39–117)
BUN: 15 mg/dL (ref 6–23)
Glucose, Bld: 196 mg/dL — ABNORMAL HIGH (ref 70–99)
HCT: 39.4 %
MCV: 82.4 fL
Potassium: 4.1 meq/L (ref 3.5–5.3)
RBC: 4.79 M/uL
Sodium: 138 meq/L (ref 135–145)
Total Bilirubin: 0.4 mg/dL (ref 0.3–1.2)
WBC: 9.6 10*3/uL

## 2007-01-09 ENCOUNTER — Telehealth: Payer: Self-pay | Admitting: *Deleted

## 2007-01-16 ENCOUNTER — Encounter: Admission: RE | Admit: 2007-01-16 | Discharge: 2007-01-16 | Payer: Self-pay | Admitting: Sports Medicine

## 2007-01-20 ENCOUNTER — Ambulatory Visit: Payer: Self-pay | Admitting: Sports Medicine

## 2007-02-03 ENCOUNTER — Ambulatory Visit: Payer: Self-pay | Admitting: Family Medicine

## 2007-02-03 LAB — CONVERTED CEMR LAB: Hgb A1c MFr Bld: 6.1 %

## 2007-02-10 ENCOUNTER — Telehealth: Payer: Self-pay | Admitting: *Deleted

## 2007-02-11 ENCOUNTER — Telehealth: Payer: Self-pay | Admitting: *Deleted

## 2007-02-12 ENCOUNTER — Telehealth: Payer: Self-pay | Admitting: *Deleted

## 2007-02-18 ENCOUNTER — Ambulatory Visit: Payer: Self-pay | Admitting: Family Medicine

## 2007-02-19 ENCOUNTER — Telehealth (INDEPENDENT_AMBULATORY_CARE_PROVIDER_SITE_OTHER): Payer: Self-pay | Admitting: *Deleted

## 2007-02-24 ENCOUNTER — Telehealth (INDEPENDENT_AMBULATORY_CARE_PROVIDER_SITE_OTHER): Payer: Self-pay | Admitting: *Deleted

## 2007-02-26 ENCOUNTER — Telehealth: Payer: Self-pay | Admitting: *Deleted

## 2007-03-02 ENCOUNTER — Ambulatory Visit: Payer: Self-pay | Admitting: Family Medicine

## 2007-03-03 ENCOUNTER — Ambulatory Visit: Payer: Self-pay | Admitting: Sports Medicine

## 2007-03-17 ENCOUNTER — Ambulatory Visit: Payer: Self-pay | Admitting: Sports Medicine

## 2007-03-23 ENCOUNTER — Telehealth (INDEPENDENT_AMBULATORY_CARE_PROVIDER_SITE_OTHER): Payer: Self-pay | Admitting: Sports Medicine

## 2007-03-24 ENCOUNTER — Encounter: Admission: RE | Admit: 2007-03-24 | Discharge: 2007-05-20 | Payer: Self-pay | Admitting: Sports Medicine

## 2007-03-24 ENCOUNTER — Encounter: Payer: Self-pay | Admitting: Family Medicine

## 2007-03-30 ENCOUNTER — Ambulatory Visit: Payer: Self-pay | Admitting: Family Medicine

## 2007-03-31 ENCOUNTER — Ambulatory Visit: Payer: Self-pay | Admitting: Sports Medicine

## 2007-03-31 LAB — CONVERTED CEMR LAB
ALT: 10 units/L (ref 0–35)
AST: 15 units/L (ref 0–37)
Albumin: 3.4 g/dL — ABNORMAL LOW (ref 3.5–5.2)
Alkaline Phosphatase: 67 units/L (ref 39–117)
BUN: 17 mg/dL (ref 6–23)
Basophils Absolute: 0.2 10*3/uL
CO2: 29 meq/L (ref 19–32)
Calcium: 8.9 mg/dL (ref 8.4–10.5)
Chloride: 98 meq/L (ref 96–112)
Creatinine, Ser: 0.95 mg/dL (ref 0.40–1.20)
Eosinophils Absolute: 0.7 10*3/uL
Glucose, Bld: 194 mg/dL — ABNORMAL HIGH (ref 70–99)
Granulocyte count absolute: 6 10*3/uL
HCT: 34.6 %
Hemoglobin: 11.5 g/dL
MCV: 84.9 fL
Monocytes Absolute: 0.2 10*3/uL
Monocytes Relative: 2.4 %
Potassium: 4.1 meq/L (ref 3.5–5.3)
RBC: 4.07 M/uL
Sodium: 138 meq/L (ref 135–145)
Total Bilirubin: 0.4 mg/dL (ref 0.3–1.2)
Total Protein: 6.9 g/dL (ref 6.0–8.3)
WBC: 8.3 10*3/uL

## 2007-04-01 ENCOUNTER — Telehealth (INDEPENDENT_AMBULATORY_CARE_PROVIDER_SITE_OTHER): Payer: Self-pay | Admitting: Sports Medicine

## 2007-04-01 ENCOUNTER — Encounter (INDEPENDENT_AMBULATORY_CARE_PROVIDER_SITE_OTHER): Payer: Self-pay | Admitting: Sports Medicine

## 2007-04-08 ENCOUNTER — Telehealth: Payer: Self-pay | Admitting: *Deleted

## 2007-04-21 ENCOUNTER — Telehealth: Payer: Self-pay | Admitting: *Deleted

## 2007-04-22 ENCOUNTER — Ambulatory Visit: Payer: Self-pay | Admitting: Family Medicine

## 2007-04-24 ENCOUNTER — Ambulatory Visit: Payer: Self-pay | Admitting: Family Medicine

## 2007-04-27 ENCOUNTER — Ambulatory Visit: Payer: Self-pay | Admitting: Family Medicine

## 2007-05-11 ENCOUNTER — Telehealth: Payer: Self-pay | Admitting: *Deleted

## 2007-05-12 ENCOUNTER — Telehealth: Payer: Self-pay | Admitting: *Deleted

## 2007-05-15 ENCOUNTER — Ambulatory Visit: Payer: Self-pay | Admitting: Family Medicine

## 2007-05-20 ENCOUNTER — Encounter: Payer: Self-pay | Admitting: Family Medicine

## 2007-05-22 ENCOUNTER — Encounter: Payer: Self-pay | Admitting: Family Medicine

## 2007-05-27 ENCOUNTER — Ambulatory Visit: Payer: Self-pay | Admitting: Family Medicine

## 2007-06-03 ENCOUNTER — Ambulatory Visit: Payer: Self-pay | Admitting: Family Medicine

## 2007-06-03 DIAGNOSIS — M549 Dorsalgia, unspecified: Secondary | ICD-10-CM | POA: Insufficient documentation

## 2007-06-03 LAB — CONVERTED CEMR LAB: Hgb A1c MFr Bld: 6.5 %

## 2007-06-08 ENCOUNTER — Encounter: Admission: RE | Admit: 2007-06-08 | Discharge: 2007-06-08 | Payer: Self-pay | Admitting: Sports Medicine

## 2007-06-11 ENCOUNTER — Encounter: Payer: Self-pay | Admitting: Family Medicine

## 2007-06-22 ENCOUNTER — Ambulatory Visit: Payer: Self-pay | Admitting: Family Medicine

## 2007-06-22 LAB — CONVERTED CEMR LAB
Ketones, urine, test strip: NEGATIVE
Nitrite: POSITIVE
Protein, U semiquant: NEGATIVE
Specific Gravity, Urine: 1.01

## 2007-06-23 ENCOUNTER — Ambulatory Visit: Payer: Self-pay | Admitting: Family Medicine

## 2007-07-06 ENCOUNTER — Ambulatory Visit: Payer: Self-pay | Admitting: Family Medicine

## 2007-07-13 ENCOUNTER — Telehealth: Payer: Self-pay | Admitting: *Deleted

## 2007-07-15 ENCOUNTER — Encounter: Payer: Self-pay | Admitting: Family Medicine

## 2007-07-20 ENCOUNTER — Ambulatory Visit: Payer: Self-pay | Admitting: Sports Medicine

## 2007-07-29 ENCOUNTER — Ambulatory Visit: Payer: Self-pay | Admitting: Family Medicine

## 2007-08-04 ENCOUNTER — Telehealth: Payer: Self-pay | Admitting: *Deleted

## 2007-08-17 ENCOUNTER — Ambulatory Visit: Payer: Self-pay | Admitting: Family Medicine

## 2007-08-19 ENCOUNTER — Ambulatory Visit: Payer: Self-pay | Admitting: Family Medicine

## 2007-08-21 ENCOUNTER — Encounter: Payer: Self-pay | Admitting: Family Medicine

## 2007-09-02 ENCOUNTER — Ambulatory Visit: Payer: Self-pay | Admitting: Family Medicine

## 2007-09-16 ENCOUNTER — Ambulatory Visit: Payer: Self-pay | Admitting: Family Medicine

## 2007-09-21 ENCOUNTER — Ambulatory Visit: Payer: Self-pay | Admitting: Family Medicine

## 2007-09-30 ENCOUNTER — Ambulatory Visit: Payer: Self-pay | Admitting: Family Medicine

## 2007-10-01 ENCOUNTER — Telehealth (INDEPENDENT_AMBULATORY_CARE_PROVIDER_SITE_OTHER): Payer: Self-pay | Admitting: *Deleted

## 2007-10-05 ENCOUNTER — Encounter: Payer: Self-pay | Admitting: Family Medicine

## 2007-10-26 ENCOUNTER — Ambulatory Visit: Payer: Self-pay | Admitting: Family Medicine

## 2007-10-28 ENCOUNTER — Ambulatory Visit: Payer: Self-pay | Admitting: Family Medicine

## 2007-11-18 ENCOUNTER — Ambulatory Visit: Payer: Self-pay | Admitting: Family Medicine

## 2007-11-23 ENCOUNTER — Ambulatory Visit: Payer: Self-pay | Admitting: Family Medicine

## 2007-11-27 ENCOUNTER — Telehealth: Payer: Self-pay | Admitting: Family Medicine

## 2007-12-09 ENCOUNTER — Ambulatory Visit: Payer: Self-pay | Admitting: Family Medicine

## 2007-12-21 ENCOUNTER — Ambulatory Visit: Payer: Self-pay | Admitting: Sports Medicine

## 2007-12-23 ENCOUNTER — Ambulatory Visit: Payer: Self-pay | Admitting: Family Medicine

## 2007-12-23 LAB — CONVERTED CEMR LAB
ALT: 30 units/L (ref 0–35)
Alkaline Phosphatase: 87 units/L (ref 39–117)
CO2: 32 meq/L (ref 19–32)
Creatinine, Ser: 0.95 mg/dL (ref 0.40–1.20)
Direct LDL: 64 mg/dL
Glucose, Bld: 188 mg/dL — ABNORMAL HIGH (ref 70–99)
Hgb A1c MFr Bld: 6.7 %
Total Bilirubin: 0.4 mg/dL (ref 0.3–1.2)

## 2007-12-24 ENCOUNTER — Encounter: Payer: Self-pay | Admitting: Family Medicine

## 2007-12-24 ENCOUNTER — Telehealth: Payer: Self-pay | Admitting: Family Medicine

## 2008-01-06 ENCOUNTER — Ambulatory Visit: Payer: Self-pay | Admitting: Family Medicine

## 2008-01-14 ENCOUNTER — Encounter: Payer: Self-pay | Admitting: Family Medicine

## 2008-01-18 ENCOUNTER — Ambulatory Visit: Payer: Self-pay | Admitting: Family Medicine

## 2008-01-20 ENCOUNTER — Ambulatory Visit: Payer: Self-pay | Admitting: Family Medicine

## 2008-01-20 LAB — CONVERTED CEMR LAB

## 2008-02-02 ENCOUNTER — Encounter: Payer: Self-pay | Admitting: Family Medicine

## 2008-02-02 DIAGNOSIS — K219 Gastro-esophageal reflux disease without esophagitis: Secondary | ICD-10-CM

## 2008-02-08 ENCOUNTER — Encounter: Payer: Self-pay | Admitting: Family Medicine

## 2008-02-10 ENCOUNTER — Ambulatory Visit: Payer: Self-pay | Admitting: Family Medicine

## 2008-02-12 ENCOUNTER — Encounter: Payer: Self-pay | Admitting: Family Medicine

## 2008-02-18 ENCOUNTER — Ambulatory Visit: Payer: Self-pay | Admitting: Family Medicine

## 2008-03-09 ENCOUNTER — Ambulatory Visit: Payer: Self-pay | Admitting: Family Medicine

## 2008-03-09 DIAGNOSIS — R5381 Other malaise: Secondary | ICD-10-CM | POA: Insufficient documentation

## 2008-03-09 DIAGNOSIS — R5383 Other fatigue: Secondary | ICD-10-CM

## 2008-03-09 LAB — CONVERTED CEMR LAB
ALT: 23 units/L (ref 0–35)
AST: 14 units/L (ref 0–37)
Albumin: 3.5 g/dL (ref 3.5–5.2)
CO2: 32 meq/L (ref 19–32)
Calcium: 8.8 mg/dL (ref 8.4–10.5)
Chloride: 107 meq/L (ref 96–112)
Potassium: 4.4 meq/L (ref 3.5–5.3)

## 2008-03-10 ENCOUNTER — Encounter: Payer: Self-pay | Admitting: Family Medicine

## 2008-03-11 ENCOUNTER — Telehealth: Payer: Self-pay | Admitting: *Deleted

## 2008-03-11 ENCOUNTER — Encounter: Admission: RE | Admit: 2008-03-11 | Discharge: 2008-03-11 | Payer: Self-pay | Admitting: Family Medicine

## 2008-03-18 ENCOUNTER — Emergency Department (HOSPITAL_COMMUNITY): Admission: EM | Admit: 2008-03-18 | Discharge: 2008-03-18 | Payer: Self-pay | Admitting: Emergency Medicine

## 2008-03-18 ENCOUNTER — Telehealth (INDEPENDENT_AMBULATORY_CARE_PROVIDER_SITE_OTHER): Payer: Self-pay | Admitting: *Deleted

## 2008-03-21 ENCOUNTER — Ambulatory Visit: Payer: Self-pay | Admitting: Family Medicine

## 2008-03-23 ENCOUNTER — Ambulatory Visit: Payer: Self-pay | Admitting: Family Medicine

## 2008-03-25 ENCOUNTER — Ambulatory Visit: Payer: Self-pay | Admitting: Family Medicine

## 2008-03-30 ENCOUNTER — Ambulatory Visit: Payer: Self-pay | Admitting: Family Medicine

## 2008-04-13 ENCOUNTER — Ambulatory Visit: Payer: Self-pay | Admitting: Family Medicine

## 2008-04-25 ENCOUNTER — Ambulatory Visit: Payer: Self-pay | Admitting: Sports Medicine

## 2008-04-27 ENCOUNTER — Ambulatory Visit: Payer: Self-pay | Admitting: Family Medicine

## 2008-04-29 ENCOUNTER — Telehealth: Payer: Self-pay | Admitting: Family Medicine

## 2008-05-16 ENCOUNTER — Encounter: Payer: Self-pay | Admitting: *Deleted

## 2008-05-18 ENCOUNTER — Ambulatory Visit: Payer: Self-pay | Admitting: Family Medicine

## 2008-05-23 ENCOUNTER — Ambulatory Visit: Payer: Self-pay | Admitting: Family Medicine

## 2008-05-26 ENCOUNTER — Encounter: Payer: Self-pay | Admitting: Family Medicine

## 2008-06-01 ENCOUNTER — Ambulatory Visit: Payer: Self-pay | Admitting: Family Medicine

## 2008-06-03 ENCOUNTER — Encounter: Payer: Self-pay | Admitting: *Deleted

## 2008-06-15 ENCOUNTER — Ambulatory Visit: Payer: Self-pay | Admitting: Family Medicine

## 2008-06-21 ENCOUNTER — Ambulatory Visit: Payer: Self-pay | Admitting: Family Medicine

## 2008-06-22 ENCOUNTER — Telehealth: Payer: Self-pay | Admitting: *Deleted

## 2008-07-06 ENCOUNTER — Ambulatory Visit: Payer: Self-pay | Admitting: Family Medicine

## 2008-07-18 ENCOUNTER — Telehealth: Payer: Self-pay | Admitting: Family Medicine

## 2008-07-20 ENCOUNTER — Ambulatory Visit: Payer: Self-pay | Admitting: Family Medicine

## 2008-07-26 ENCOUNTER — Ambulatory Visit: Payer: Self-pay | Admitting: Family Medicine

## 2008-08-17 ENCOUNTER — Ambulatory Visit: Payer: Self-pay | Admitting: Family Medicine

## 2008-08-17 LAB — CONVERTED CEMR LAB
Albumin: 3.5 g/dL (ref 3.5–5.2)
Alkaline Phosphatase: 67 units/L (ref 39–117)
CO2: 31 meq/L (ref 19–32)
Calcium: 9.1 mg/dL (ref 8.4–10.5)
Chloride: 97 meq/L (ref 96–112)
Glucose, Bld: 268 mg/dL — ABNORMAL HIGH (ref 70–99)
Potassium: 4.9 meq/L (ref 3.5–5.3)
Sodium: 137 meq/L (ref 135–145)
Total Protein: 7 g/dL (ref 6.0–8.3)

## 2008-08-18 ENCOUNTER — Telehealth: Payer: Self-pay | Admitting: *Deleted

## 2008-08-23 ENCOUNTER — Ambulatory Visit: Payer: Self-pay | Admitting: Family Medicine

## 2008-08-30 ENCOUNTER — Encounter: Payer: Self-pay | Admitting: Family Medicine

## 2008-08-31 ENCOUNTER — Ambulatory Visit: Payer: Self-pay | Admitting: Family Medicine

## 2008-09-14 ENCOUNTER — Ambulatory Visit: Payer: Self-pay | Admitting: Family Medicine

## 2008-09-20 ENCOUNTER — Ambulatory Visit: Payer: Self-pay | Admitting: Family Medicine

## 2008-09-27 ENCOUNTER — Encounter: Payer: Self-pay | Admitting: Family Medicine

## 2008-10-04 ENCOUNTER — Ambulatory Visit: Payer: Self-pay | Admitting: Family Medicine

## 2008-10-24 ENCOUNTER — Ambulatory Visit: Payer: Self-pay | Admitting: Family Medicine

## 2008-10-26 ENCOUNTER — Ambulatory Visit: Payer: Self-pay | Admitting: Family Medicine

## 2008-10-26 LAB — CONVERTED CEMR LAB: Hgb A1c MFr Bld: 7.7 %

## 2008-11-03 ENCOUNTER — Ambulatory Visit: Payer: Self-pay | Admitting: Family Medicine

## 2008-11-07 ENCOUNTER — Ambulatory Visit: Payer: Self-pay | Admitting: Family Medicine

## 2008-11-09 ENCOUNTER — Ambulatory Visit: Payer: Self-pay | Admitting: Family Medicine

## 2008-11-16 ENCOUNTER — Telehealth (INDEPENDENT_AMBULATORY_CARE_PROVIDER_SITE_OTHER): Payer: Self-pay | Admitting: *Deleted

## 2008-11-16 ENCOUNTER — Ambulatory Visit: Payer: Self-pay | Admitting: Family Medicine

## 2008-11-17 ENCOUNTER — Ambulatory Visit: Payer: Self-pay | Admitting: Family Medicine

## 2008-11-23 ENCOUNTER — Ambulatory Visit: Payer: Self-pay | Admitting: Family Medicine

## 2008-11-25 ENCOUNTER — Encounter: Payer: Self-pay | Admitting: Family Medicine

## 2008-11-26 ENCOUNTER — Telehealth: Payer: Self-pay | Admitting: Family Medicine

## 2008-11-28 ENCOUNTER — Encounter: Payer: Self-pay | Admitting: Family Medicine

## 2008-11-28 ENCOUNTER — Ambulatory Visit: Payer: Self-pay | Admitting: Family Medicine

## 2008-11-30 ENCOUNTER — Ambulatory Visit: Payer: Self-pay | Admitting: Family Medicine

## 2008-12-07 ENCOUNTER — Ambulatory Visit: Payer: Self-pay | Admitting: Family Medicine

## 2008-12-08 ENCOUNTER — Ambulatory Visit: Payer: Self-pay | Admitting: Family Medicine

## 2008-12-08 LAB — CONVERTED CEMR LAB

## 2008-12-14 ENCOUNTER — Ambulatory Visit: Payer: Self-pay | Admitting: Family Medicine

## 2008-12-21 ENCOUNTER — Ambulatory Visit: Payer: Self-pay | Admitting: Family Medicine

## 2008-12-23 ENCOUNTER — Telehealth: Payer: Self-pay | Admitting: *Deleted

## 2008-12-28 ENCOUNTER — Ambulatory Visit: Payer: Self-pay | Admitting: Family Medicine

## 2009-01-03 ENCOUNTER — Ambulatory Visit: Payer: Self-pay | Admitting: Family Medicine

## 2009-01-05 ENCOUNTER — Ambulatory Visit: Payer: Self-pay | Admitting: Family Medicine

## 2009-01-09 ENCOUNTER — Telehealth: Payer: Self-pay | Admitting: *Deleted

## 2009-01-10 ENCOUNTER — Ambulatory Visit: Payer: Self-pay | Admitting: Family Medicine

## 2009-01-17 ENCOUNTER — Encounter: Payer: Self-pay | Admitting: Family Medicine

## 2009-01-17 ENCOUNTER — Ambulatory Visit: Payer: Self-pay | Admitting: Family Medicine

## 2009-01-17 ENCOUNTER — Inpatient Hospital Stay (HOSPITAL_COMMUNITY): Admission: EM | Admit: 2009-01-17 | Discharge: 2009-01-23 | Payer: Self-pay | Admitting: Emergency Medicine

## 2009-01-17 DIAGNOSIS — B369 Superficial mycosis, unspecified: Secondary | ICD-10-CM | POA: Insufficient documentation

## 2009-01-18 ENCOUNTER — Encounter (INDEPENDENT_AMBULATORY_CARE_PROVIDER_SITE_OTHER): Payer: Self-pay | Admitting: Family Medicine

## 2009-01-19 ENCOUNTER — Encounter: Payer: Self-pay | Admitting: Family Medicine

## 2009-01-19 ENCOUNTER — Ambulatory Visit: Payer: Self-pay | Admitting: Surgery

## 2009-01-23 ENCOUNTER — Encounter: Payer: Self-pay | Admitting: Family Medicine

## 2009-01-26 ENCOUNTER — Ambulatory Visit: Payer: Self-pay | Admitting: Family Medicine

## 2009-01-31 ENCOUNTER — Telehealth: Payer: Self-pay | Admitting: Family Medicine

## 2009-02-02 ENCOUNTER — Ambulatory Visit: Payer: Self-pay | Admitting: Family Medicine

## 2009-02-03 ENCOUNTER — Ambulatory Visit: Payer: Self-pay | Admitting: Family Medicine

## 2009-02-03 DIAGNOSIS — I872 Venous insufficiency (chronic) (peripheral): Secondary | ICD-10-CM | POA: Insufficient documentation

## 2009-02-13 ENCOUNTER — Encounter: Payer: Self-pay | Admitting: Family Medicine

## 2009-02-13 ENCOUNTER — Ambulatory Visit: Payer: Self-pay | Admitting: Family Medicine

## 2009-02-13 DIAGNOSIS — F411 Generalized anxiety disorder: Secondary | ICD-10-CM | POA: Insufficient documentation

## 2009-02-13 LAB — CONVERTED CEMR LAB
BUN: 31 mg/dL — ABNORMAL HIGH (ref 6–23)
CO2: 33 meq/L — ABNORMAL HIGH (ref 19–32)
Calcium: 8.7 mg/dL (ref 8.4–10.5)
Glucose, Bld: 248 mg/dL — ABNORMAL HIGH (ref 70–99)
Sodium: 139 meq/L (ref 135–145)

## 2009-02-23 ENCOUNTER — Telehealth: Payer: Self-pay | Admitting: *Deleted

## 2009-02-24 ENCOUNTER — Telehealth: Payer: Self-pay | Admitting: *Deleted

## 2009-03-02 ENCOUNTER — Ambulatory Visit: Payer: Self-pay | Admitting: Family Medicine

## 2009-03-06 ENCOUNTER — Ambulatory Visit: Payer: Self-pay | Admitting: Family Medicine

## 2009-03-07 ENCOUNTER — Encounter: Payer: Self-pay | Admitting: Family Medicine

## 2009-03-08 ENCOUNTER — Telehealth: Payer: Self-pay | Admitting: Family Medicine

## 2009-03-09 ENCOUNTER — Encounter: Payer: Self-pay | Admitting: Family Medicine

## 2009-03-17 ENCOUNTER — Encounter: Payer: Self-pay | Admitting: Family Medicine

## 2009-03-17 ENCOUNTER — Ambulatory Visit: Payer: Self-pay | Admitting: Family Medicine

## 2009-03-17 LAB — CONVERTED CEMR LAB
BUN: 23 mg/dL (ref 6–23)
Chloride: 95 meq/L — ABNORMAL LOW (ref 96–112)
Creatinine, Ser: 1.07 mg/dL (ref 0.40–1.20)

## 2009-03-29 ENCOUNTER — Encounter: Payer: Self-pay | Admitting: Family Medicine

## 2009-03-31 ENCOUNTER — Ambulatory Visit: Payer: Self-pay | Admitting: Family Medicine

## 2009-04-04 ENCOUNTER — Encounter: Payer: Self-pay | Admitting: Family Medicine

## 2009-04-10 ENCOUNTER — Ambulatory Visit: Payer: Self-pay | Admitting: Family Medicine

## 2009-04-13 ENCOUNTER — Ambulatory Visit: Payer: Self-pay | Admitting: Family Medicine

## 2009-04-25 ENCOUNTER — Encounter: Payer: Self-pay | Admitting: Family Medicine

## 2009-04-25 ENCOUNTER — Ambulatory Visit: Payer: Self-pay | Admitting: Family Medicine

## 2009-04-25 LAB — CONVERTED CEMR LAB
AST: 12 units/L (ref 0–37)
Albumin: 3.5 g/dL (ref 3.5–5.2)
Alkaline Phosphatase: 62 units/L (ref 39–117)
BUN: 26 mg/dL — ABNORMAL HIGH (ref 6–23)
Creatinine, Ser: 0.97 mg/dL (ref 0.40–1.20)
HDL: 33 mg/dL — ABNORMAL LOW (ref >39)
LDL Cholesterol: 47 mg/dL (ref 0–99)
Potassium: 4.1 meq/L (ref 3.5–5.3)
Total CHOL/HDL Ratio: 3

## 2009-05-09 ENCOUNTER — Encounter: Payer: Self-pay | Admitting: *Deleted

## 2009-05-09 ENCOUNTER — Ambulatory Visit: Payer: Self-pay | Admitting: Family Medicine

## 2009-05-09 DIAGNOSIS — L89309 Pressure ulcer of unspecified buttock, unspecified stage: Secondary | ICD-10-CM | POA: Insufficient documentation

## 2009-05-10 ENCOUNTER — Telehealth: Payer: Self-pay | Admitting: *Deleted

## 2009-05-12 ENCOUNTER — Encounter: Payer: Self-pay | Admitting: Family Medicine

## 2009-05-15 ENCOUNTER — Ambulatory Visit: Payer: Self-pay | Admitting: Family Medicine

## 2009-05-19 ENCOUNTER — Ambulatory Visit: Payer: Self-pay | Admitting: Family Medicine

## 2009-05-19 ENCOUNTER — Encounter: Payer: Self-pay | Admitting: Family Medicine

## 2009-05-22 LAB — CONVERTED CEMR LAB
Platelets: 200 10*3/uL (ref 150–400)
WBC: 9.4 10*3/uL (ref 4.0–10.5)

## 2009-06-02 ENCOUNTER — Ambulatory Visit: Payer: Self-pay | Admitting: Family Medicine

## 2009-06-12 ENCOUNTER — Ambulatory Visit: Payer: Self-pay | Admitting: Family Medicine

## 2009-06-12 ENCOUNTER — Encounter: Payer: Self-pay | Admitting: Family Medicine

## 2009-06-15 ENCOUNTER — Telehealth: Payer: Self-pay | Admitting: Family Medicine

## 2009-06-23 ENCOUNTER — Ambulatory Visit (HOSPITAL_COMMUNITY): Admission: RE | Admit: 2009-06-23 | Discharge: 2009-06-23 | Payer: Self-pay | Admitting: Family Medicine

## 2009-06-26 ENCOUNTER — Ambulatory Visit: Payer: Self-pay | Admitting: Family Medicine

## 2009-07-03 ENCOUNTER — Ambulatory Visit: Payer: Self-pay | Admitting: Family Medicine

## 2009-07-11 ENCOUNTER — Telehealth: Payer: Self-pay | Admitting: *Deleted

## 2009-07-12 ENCOUNTER — Encounter: Payer: Self-pay | Admitting: *Deleted

## 2009-07-20 ENCOUNTER — Telehealth: Payer: Self-pay | Admitting: *Deleted

## 2009-07-21 ENCOUNTER — Ambulatory Visit: Payer: Self-pay | Admitting: Family Medicine

## 2009-07-21 DIAGNOSIS — J449 Chronic obstructive pulmonary disease, unspecified: Secondary | ICD-10-CM

## 2009-07-21 DIAGNOSIS — J4489 Other specified chronic obstructive pulmonary disease: Secondary | ICD-10-CM | POA: Insufficient documentation

## 2009-07-21 LAB — CONVERTED CEMR LAB: Hgb A1c MFr Bld: 6.2 %

## 2009-07-31 ENCOUNTER — Ambulatory Visit: Payer: Self-pay | Admitting: Family Medicine

## 2009-08-08 ENCOUNTER — Ambulatory Visit: Payer: Self-pay | Admitting: Family Medicine

## 2009-08-08 ENCOUNTER — Encounter: Payer: Self-pay | Admitting: Psychology

## 2009-08-08 DIAGNOSIS — R0789 Other chest pain: Secondary | ICD-10-CM

## 2009-08-16 ENCOUNTER — Ambulatory Visit: Payer: Self-pay | Admitting: Family Medicine

## 2009-08-31 ENCOUNTER — Telehealth: Payer: Self-pay | Admitting: Family Medicine

## 2009-09-04 ENCOUNTER — Encounter: Payer: Self-pay | Admitting: Family Medicine

## 2009-09-04 ENCOUNTER — Ambulatory Visit: Payer: Self-pay | Admitting: Family Medicine

## 2009-09-06 ENCOUNTER — Ambulatory Visit: Payer: Self-pay | Admitting: Family Medicine

## 2009-09-06 DIAGNOSIS — T7491XA Unspecified adult maltreatment, confirmed, initial encounter: Secondary | ICD-10-CM | POA: Insufficient documentation

## 2009-09-06 LAB — CONVERTED CEMR LAB
BUN: 23 mg/dL (ref 6–23)
Chloride: 95 meq/L — ABNORMAL LOW (ref 96–112)
Glucose, Bld: 152 mg/dL — ABNORMAL HIGH (ref 70–99)
Hemoglobin: 10.3 g/dL — ABNORMAL LOW (ref 12.0–15.0)
Potassium: 4.6 meq/L (ref 3.5–5.3)
RBC: 3.83 M/uL — ABNORMAL LOW (ref 3.87–5.11)
WBC: 8.4 10*3/uL (ref 4.0–10.5)

## 2009-09-11 ENCOUNTER — Telehealth: Payer: Self-pay | Admitting: Family Medicine

## 2009-09-12 ENCOUNTER — Ambulatory Visit: Payer: Self-pay | Admitting: Family Medicine

## 2009-09-12 DIAGNOSIS — G2401 Drug induced subacute dyskinesia: Secondary | ICD-10-CM | POA: Insufficient documentation

## 2009-09-21 ENCOUNTER — Ambulatory Visit: Payer: Self-pay | Admitting: Family Medicine

## 2009-09-21 DIAGNOSIS — D649 Anemia, unspecified: Secondary | ICD-10-CM | POA: Insufficient documentation

## 2009-10-12 ENCOUNTER — Encounter: Payer: Self-pay | Admitting: Family Medicine

## 2009-10-16 ENCOUNTER — Ambulatory Visit: Payer: Self-pay | Admitting: Family Medicine

## 2009-10-18 ENCOUNTER — Telehealth: Payer: Self-pay | Admitting: Family Medicine

## 2009-10-19 ENCOUNTER — Encounter: Payer: Self-pay | Admitting: Family Medicine

## 2009-10-20 ENCOUNTER — Ambulatory Visit: Payer: Self-pay | Admitting: Family Medicine

## 2009-10-20 LAB — CONVERTED CEMR LAB
Blood in Urine, dipstick: NEGATIVE
Glucose, Urine, Semiquant: 500
Nitrite: NEGATIVE
Protein, U semiquant: NEGATIVE
WBC Urine, dipstick: NEGATIVE
pH: 5.5

## 2009-10-30 ENCOUNTER — Telehealth: Payer: Self-pay | Admitting: Family Medicine

## 2009-10-30 ENCOUNTER — Encounter: Payer: Self-pay | Admitting: Family Medicine

## 2009-10-31 ENCOUNTER — Ambulatory Visit: Payer: Self-pay | Admitting: Family Medicine

## 2009-11-06 ENCOUNTER — Telehealth: Payer: Self-pay | Admitting: Family Medicine

## 2009-11-20 ENCOUNTER — Ambulatory Visit: Payer: Self-pay | Admitting: Family Medicine

## 2009-11-21 ENCOUNTER — Encounter: Payer: Self-pay | Admitting: Family Medicine

## 2009-11-21 ENCOUNTER — Telehealth: Payer: Self-pay | Admitting: Family Medicine

## 2009-11-22 ENCOUNTER — Telehealth: Payer: Self-pay | Admitting: Family Medicine

## 2009-11-23 ENCOUNTER — Ambulatory Visit: Payer: Self-pay | Admitting: Family Medicine

## 2009-11-23 DIAGNOSIS — K5900 Constipation, unspecified: Secondary | ICD-10-CM | POA: Insufficient documentation

## 2009-11-28 ENCOUNTER — Ambulatory Visit: Payer: Self-pay | Admitting: Family Medicine

## 2009-11-28 ENCOUNTER — Encounter: Payer: Self-pay | Admitting: Family Medicine

## 2009-11-28 LAB — CONVERTED CEMR LAB
ALT: 10 units/L (ref 0–35)
Albumin: 3.5 g/dL (ref 3.5–5.2)
Alkaline Phosphatase: 61 units/L (ref 39–117)
Chloride: 97 meq/L (ref 96–112)
HCT: 35.1 % — ABNORMAL LOW (ref 36.0–46.0)
Hemoglobin: 10.8 g/dL — ABNORMAL LOW (ref 12.0–15.0)
RBC: 3.96 M/uL (ref 3.87–5.11)
RDW: 14.8 % (ref 11.5–15.5)
Total Bilirubin: 0.3 mg/dL (ref 0.3–1.2)
Total Protein: 6.6 g/dL (ref 6.0–8.3)

## 2009-11-29 ENCOUNTER — Telehealth: Payer: Self-pay | Admitting: Family Medicine

## 2009-12-01 ENCOUNTER — Ambulatory Visit (HOSPITAL_COMMUNITY): Admission: RE | Admit: 2009-12-01 | Discharge: 2009-12-01 | Payer: Self-pay | Admitting: Family Medicine

## 2009-12-04 ENCOUNTER — Telehealth: Payer: Self-pay | Admitting: Family Medicine

## 2009-12-05 ENCOUNTER — Ambulatory Visit: Payer: Self-pay | Admitting: Family Medicine

## 2009-12-06 ENCOUNTER — Telehealth: Payer: Self-pay | Admitting: Family Medicine

## 2009-12-07 ENCOUNTER — Telehealth: Payer: Self-pay | Admitting: Family Medicine

## 2009-12-12 ENCOUNTER — Ambulatory Visit: Payer: Self-pay | Admitting: Family Medicine

## 2009-12-12 ENCOUNTER — Encounter: Payer: Self-pay | Admitting: Family Medicine

## 2009-12-15 ENCOUNTER — Ambulatory Visit (HOSPITAL_COMMUNITY): Admission: RE | Admit: 2009-12-15 | Discharge: 2009-12-15 | Payer: Self-pay | Admitting: Family Medicine

## 2009-12-18 ENCOUNTER — Ambulatory Visit: Payer: Self-pay | Admitting: Family Medicine

## 2009-12-20 ENCOUNTER — Telehealth: Payer: Self-pay | Admitting: Family Medicine

## 2009-12-21 ENCOUNTER — Encounter: Payer: Self-pay | Admitting: Family Medicine

## 2009-12-28 ENCOUNTER — Ambulatory Visit: Payer: Self-pay | Admitting: Family Medicine

## 2010-01-02 ENCOUNTER — Telehealth: Payer: Self-pay | Admitting: Family Medicine

## 2010-01-02 ENCOUNTER — Telehealth (INDEPENDENT_AMBULATORY_CARE_PROVIDER_SITE_OTHER): Payer: Self-pay | Admitting: Family Medicine

## 2010-01-09 ENCOUNTER — Encounter: Payer: Self-pay | Admitting: Family Medicine

## 2010-01-19 ENCOUNTER — Encounter: Admission: RE | Admit: 2010-01-19 | Discharge: 2010-01-19 | Payer: Self-pay | Admitting: Gastroenterology

## 2010-01-23 ENCOUNTER — Ambulatory Visit: Payer: Self-pay | Admitting: Family Medicine

## 2010-01-25 ENCOUNTER — Telehealth: Payer: Self-pay | Admitting: Family Medicine

## 2010-01-25 ENCOUNTER — Ambulatory Visit (HOSPITAL_COMMUNITY): Admission: RE | Admit: 2010-01-25 | Discharge: 2010-01-25 | Payer: Self-pay | Admitting: Family Medicine

## 2010-01-31 ENCOUNTER — Encounter: Payer: Self-pay | Admitting: Family Medicine

## 2010-02-05 ENCOUNTER — Ambulatory Visit: Payer: Self-pay | Admitting: Family Medicine

## 2010-02-05 ENCOUNTER — Telehealth: Payer: Self-pay | Admitting: Family Medicine

## 2010-02-06 ENCOUNTER — Telehealth: Payer: Self-pay | Admitting: Family Medicine

## 2010-02-08 ENCOUNTER — Ambulatory Visit: Payer: Self-pay | Admitting: Family Medicine

## 2010-02-16 ENCOUNTER — Ambulatory Visit (HOSPITAL_COMMUNITY): Admission: RE | Admit: 2010-02-16 | Discharge: 2010-02-16 | Payer: Self-pay | Admitting: Gastroenterology

## 2010-02-21 ENCOUNTER — Encounter: Payer: Self-pay | Admitting: *Deleted

## 2010-02-27 ENCOUNTER — Telehealth: Payer: Self-pay | Admitting: Family Medicine

## 2010-02-27 ENCOUNTER — Encounter: Payer: Self-pay | Admitting: Family Medicine

## 2010-02-28 ENCOUNTER — Encounter: Payer: Self-pay | Admitting: Family Medicine

## 2010-02-28 ENCOUNTER — Ambulatory Visit: Payer: Self-pay | Admitting: Family Medicine

## 2010-02-28 ENCOUNTER — Telehealth: Payer: Self-pay | Admitting: Family Medicine

## 2010-03-01 ENCOUNTER — Encounter: Payer: Self-pay | Admitting: Family Medicine

## 2010-03-01 ENCOUNTER — Telehealth: Payer: Self-pay | Admitting: Family Medicine

## 2010-03-08 ENCOUNTER — Ambulatory Visit: Payer: Self-pay | Admitting: Family Medicine

## 2010-03-14 ENCOUNTER — Encounter: Payer: Self-pay | Admitting: *Deleted

## 2010-03-15 ENCOUNTER — Encounter: Payer: Self-pay | Admitting: Family Medicine

## 2010-03-15 ENCOUNTER — Ambulatory Visit: Payer: Self-pay | Admitting: Family Medicine

## 2010-03-15 LAB — CONVERTED CEMR LAB
BUN: 24 mg/dL — ABNORMAL HIGH (ref 6–23)
Potassium: 4.8 meq/L (ref 3.5–5.3)
Sodium: 137 meq/L (ref 135–145)

## 2010-03-16 ENCOUNTER — Ambulatory Visit (HOSPITAL_COMMUNITY): Admission: RE | Admit: 2010-03-16 | Discharge: 2010-03-16 | Payer: Self-pay | Admitting: Family Medicine

## 2010-03-20 ENCOUNTER — Encounter: Admission: RE | Admit: 2010-03-20 | Discharge: 2010-04-03 | Payer: Self-pay | Admitting: Family Medicine

## 2010-03-28 ENCOUNTER — Ambulatory Visit: Payer: Self-pay | Admitting: Family Medicine

## 2010-04-02 ENCOUNTER — Ambulatory Visit: Payer: Self-pay | Admitting: Family Medicine

## 2010-04-04 ENCOUNTER — Encounter: Payer: Self-pay | Admitting: Family Medicine

## 2010-04-12 ENCOUNTER — Ambulatory Visit: Payer: Self-pay | Admitting: Family Medicine

## 2010-04-16 ENCOUNTER — Encounter: Payer: Self-pay | Admitting: Family Medicine

## 2010-04-16 LAB — CONVERTED CEMR LAB
MCV: 87.1 fL (ref 78.0–100.0)
RBC: 3.72 M/uL — ABNORMAL LOW (ref 3.87–5.11)
WBC: 7.8 10*3/uL (ref 4.0–10.5)

## 2010-04-18 ENCOUNTER — Telehealth: Payer: Self-pay | Admitting: Family Medicine

## 2010-04-26 ENCOUNTER — Encounter: Payer: Self-pay | Admitting: Family Medicine

## 2010-04-30 ENCOUNTER — Ambulatory Visit: Payer: Self-pay | Admitting: Family Medicine

## 2010-05-03 ENCOUNTER — Ambulatory Visit (HOSPITAL_COMMUNITY): Admission: RE | Admit: 2010-05-03 | Discharge: 2010-05-03 | Payer: Self-pay | Admitting: Gastroenterology

## 2010-05-03 ENCOUNTER — Ambulatory Visit (HOSPITAL_COMMUNITY): Admission: RE | Admit: 2010-05-03 | Discharge: 2010-05-03 | Payer: Self-pay | Admitting: Family Medicine

## 2010-05-07 ENCOUNTER — Ambulatory Visit: Payer: Self-pay | Admitting: Family Medicine

## 2010-05-17 ENCOUNTER — Ambulatory Visit: Payer: Self-pay | Admitting: Family Medicine

## 2010-05-17 LAB — CONVERTED CEMR LAB: Hgb A1c MFr Bld: 7.4 %

## 2010-05-18 ENCOUNTER — Ambulatory Visit (HOSPITAL_COMMUNITY): Admission: RE | Admit: 2010-05-18 | Discharge: 2010-05-18 | Payer: Self-pay | Admitting: Family Medicine

## 2010-06-06 ENCOUNTER — Ambulatory Visit: Payer: Self-pay | Admitting: Family Medicine

## 2010-06-11 ENCOUNTER — Encounter: Payer: Self-pay | Admitting: Family Medicine

## 2010-06-13 ENCOUNTER — Encounter: Payer: Self-pay | Admitting: Family Medicine

## 2010-06-13 ENCOUNTER — Ambulatory Visit: Payer: Self-pay | Admitting: Family Medicine

## 2010-06-13 ENCOUNTER — Inpatient Hospital Stay (HOSPITAL_COMMUNITY): Admission: EM | Admit: 2010-06-13 | Discharge: 2010-06-20 | Payer: Self-pay | Admitting: Emergency Medicine

## 2010-06-13 ENCOUNTER — Ambulatory Visit: Payer: Self-pay | Admitting: Internal Medicine

## 2010-06-14 ENCOUNTER — Ambulatory Visit: Payer: Self-pay | Admitting: Vascular Surgery

## 2010-06-14 ENCOUNTER — Encounter (INDEPENDENT_AMBULATORY_CARE_PROVIDER_SITE_OTHER): Payer: Self-pay | Admitting: Internal Medicine

## 2010-06-14 ENCOUNTER — Encounter: Payer: Self-pay | Admitting: Family Medicine

## 2010-06-14 HISTORY — PX: PTCA: SHX146

## 2010-06-14 HISTORY — PX: TRANSTHORACIC ECHOCARDIOGRAM: SHX275

## 2010-06-14 LAB — CONVERTED CEMR LAB
HCT: 31.7 % — ABNORMAL LOW (ref 36.0–46.0)
Hemoglobin: 9.9 g/dL — ABNORMAL LOW (ref 12.0–15.0)
RDW: 14 % (ref 11.5–15.5)

## 2010-06-25 ENCOUNTER — Telehealth: Payer: Self-pay | Admitting: Family Medicine

## 2010-06-25 ENCOUNTER — Ambulatory Visit: Payer: Self-pay | Admitting: Family Medicine

## 2010-06-25 DIAGNOSIS — I2510 Atherosclerotic heart disease of native coronary artery without angina pectoris: Secondary | ICD-10-CM | POA: Insufficient documentation

## 2010-06-25 DIAGNOSIS — N189 Chronic kidney disease, unspecified: Secondary | ICD-10-CM

## 2010-06-25 LAB — CONVERTED CEMR LAB
BUN: 24 mg/dL — ABNORMAL HIGH (ref 6–23)
CO2: 35 meq/L — ABNORMAL HIGH (ref 19–32)
Chloride: 97 meq/L (ref 96–112)
Glucose, Bld: 156 mg/dL — ABNORMAL HIGH (ref 70–99)
Potassium: 4.7 meq/L (ref 3.5–5.3)

## 2010-06-26 ENCOUNTER — Telehealth: Payer: Self-pay | Admitting: Family Medicine

## 2010-07-03 ENCOUNTER — Encounter: Payer: Self-pay | Admitting: Family Medicine

## 2010-07-03 ENCOUNTER — Ambulatory Visit: Payer: Self-pay | Admitting: Family Medicine

## 2010-07-03 ENCOUNTER — Inpatient Hospital Stay (HOSPITAL_COMMUNITY): Admission: EM | Admit: 2010-07-03 | Discharge: 2010-07-09 | Payer: Self-pay | Admitting: Emergency Medicine

## 2010-07-05 ENCOUNTER — Encounter: Payer: Self-pay | Admitting: Family Medicine

## 2010-07-10 ENCOUNTER — Telehealth: Payer: Self-pay | Admitting: Family Medicine

## 2010-07-16 ENCOUNTER — Telehealth: Payer: Self-pay | Admitting: Family Medicine

## 2010-07-17 ENCOUNTER — Encounter: Payer: Self-pay | Admitting: Family Medicine

## 2010-07-19 ENCOUNTER — Ambulatory Visit: Payer: Self-pay | Admitting: Family Medicine

## 2010-07-23 ENCOUNTER — Telehealth: Payer: Self-pay | Admitting: Family Medicine

## 2010-07-26 ENCOUNTER — Ambulatory Visit (HOSPITAL_COMMUNITY): Admission: RE | Admit: 2010-07-26 | Discharge: 2010-07-26 | Payer: Self-pay | Admitting: Family Medicine

## 2010-07-27 ENCOUNTER — Encounter: Payer: Self-pay | Admitting: Family Medicine

## 2010-07-28 ENCOUNTER — Emergency Department (HOSPITAL_COMMUNITY): Admission: EM | Admit: 2010-07-28 | Discharge: 2010-07-28 | Payer: Self-pay | Admitting: Emergency Medicine

## 2010-08-02 ENCOUNTER — Ambulatory Visit: Payer: Self-pay | Admitting: Family Medicine

## 2010-08-02 DIAGNOSIS — S8010XA Contusion of unspecified lower leg, initial encounter: Secondary | ICD-10-CM

## 2010-08-15 ENCOUNTER — Telehealth: Payer: Self-pay | Admitting: Family Medicine

## 2010-08-22 ENCOUNTER — Ambulatory Visit: Payer: Self-pay | Admitting: Family Medicine

## 2010-08-24 LAB — CONVERTED CEMR LAB
MCHC: 28.2 g/dL — ABNORMAL LOW (ref 30.0–36.0)
MCV: 86.9 fL (ref 78.0–100.0)
Platelets: 190 10*3/uL (ref 150–400)
RDW: 15.2 % (ref 11.5–15.5)
TSH: 1.718 microintl units/mL (ref 0.350–4.500)

## 2010-08-27 ENCOUNTER — Ambulatory Visit: Payer: Self-pay | Admitting: Family Medicine

## 2010-09-03 ENCOUNTER — Telehealth: Payer: Self-pay | Admitting: Family Medicine

## 2010-09-03 ENCOUNTER — Ambulatory Visit: Payer: Self-pay | Admitting: Family Medicine

## 2010-09-03 DIAGNOSIS — R1011 Right upper quadrant pain: Secondary | ICD-10-CM

## 2010-09-03 LAB — CONVERTED CEMR LAB
ALT: 13 units/L (ref 0–35)
AST: 13 units/L (ref 0–37)
Creatinine, Ser: 1.01 mg/dL (ref 0.40–1.20)
Total Bilirubin: 0.3 mg/dL (ref 0.3–1.2)

## 2010-09-10 ENCOUNTER — Ambulatory Visit (HOSPITAL_COMMUNITY): Admission: RE | Admit: 2010-09-10 | Discharge: 2010-09-10 | Payer: Self-pay | Admitting: Family Medicine

## 2010-09-14 ENCOUNTER — Telehealth: Payer: Self-pay | Admitting: Family Medicine

## 2010-09-14 ENCOUNTER — Encounter (INDEPENDENT_AMBULATORY_CARE_PROVIDER_SITE_OTHER): Payer: Self-pay | Admitting: *Deleted

## 2010-09-17 ENCOUNTER — Ambulatory Visit: Payer: Self-pay | Admitting: Family Medicine

## 2010-09-20 ENCOUNTER — Ambulatory Visit: Payer: Self-pay | Admitting: Family Medicine

## 2010-09-21 ENCOUNTER — Telehealth: Payer: Self-pay | Admitting: Family Medicine

## 2010-10-03 ENCOUNTER — Ambulatory Visit (HOSPITAL_COMMUNITY)
Admission: RE | Admit: 2010-10-03 | Discharge: 2010-10-03 | Payer: Self-pay | Source: Home / Self Care | Attending: Family Medicine | Admitting: Family Medicine

## 2010-10-04 ENCOUNTER — Ambulatory Visit: Payer: Self-pay | Admitting: Family Medicine

## 2010-10-09 ENCOUNTER — Telehealth: Payer: Self-pay | Admitting: Family Medicine

## 2010-10-16 ENCOUNTER — Telehealth: Payer: Self-pay | Admitting: Family Medicine

## 2010-10-22 ENCOUNTER — Ambulatory Visit: Admit: 2010-10-22 | Payer: Self-pay

## 2010-10-25 ENCOUNTER — Ambulatory Visit: Admission: RE | Admit: 2010-10-25 | Discharge: 2010-10-25 | Payer: Self-pay | Source: Home / Self Care

## 2010-10-29 ENCOUNTER — Telehealth: Payer: Self-pay | Admitting: Family Medicine

## 2010-10-31 ENCOUNTER — Telehealth: Payer: Self-pay | Admitting: *Deleted

## 2010-11-04 ENCOUNTER — Encounter (HOSPITAL_BASED_OUTPATIENT_CLINIC_OR_DEPARTMENT_OTHER): Payer: Self-pay | Admitting: Internal Medicine

## 2010-11-04 ENCOUNTER — Encounter: Payer: Self-pay | Admitting: Family Medicine

## 2010-11-08 ENCOUNTER — Ambulatory Visit: Admission: RE | Admit: 2010-11-08 | Discharge: 2010-11-08 | Payer: Self-pay | Source: Home / Self Care

## 2010-11-08 ENCOUNTER — Encounter: Payer: Self-pay | Admitting: Family Medicine

## 2010-11-15 NOTE — Miscellaneous (Signed)
Summary: diabetic supplies from [facility]  Clinical Lists Changes   Diabetic supplies ordered from [facility] Including control solution, lancets, lancing device, test strips battery replacement. Ellwood Steidle Swaziland MD  Feb 27, 2010 10:56 AM

## 2010-11-15 NOTE — Assessment & Plan Note (Signed)
Summary: F/U/KH   Vital Signs:  Patient profile:   70 year old female Weight:      290 pounds Temp:     98 degrees F oral Pulse rate:   71 / minute Pulse rhythm:   regular BP sitting:   139 / 68  (left arm) Cuff size:   large  Vitals Entered By: Loralee Pacas CMA (April 12, 2010 9:58 AM)  Primary Care Provider:  Sarah Swaziland MD   History of Present Illness: 70 yo female here to follow up chronic problems.  Still with abdominal pain.  Reports continued, constant pain.  Burning.  Still with R sided pain,  feels like it is just under the skin.  Seen by GI, told they cannot help her anymore, but gave her a new medicine.  Forgot the name of it.  It helps ease it a little bit.  Still taking nausea meds with some relief.  Sometimes pain is managable. Now reports her abd pain is worse than her back pain.    Sugars still elevated lots of 200s.  No lows.  Taking insulin regularly.    REports things are ok with [name].  Feels safe, but he gripes a lot.    Breathing not great.  Needs oxygen all the time.    Still with bedsore on back.  Thinks it stays about the same - sore all the time.    Feels tired. Not sleeping well.    Allergies: 1)  ! Amoxicillin 2)  ! Ibuprofen 3)  ! Haldol 4)  Naprosyn (Naproxen) 5)  Norvasc (Amlodipine Besylate)  Review of Systems       See HPI  Physical Exam  General:  Obese, in wheelchair, using O2.  No acute distress.  Cooperative with exam.  Vitals noted. Lungs:  Decreased breath sounds throughout.   chest expands symmetrically. +wheezes,  no crackles. Heart:  Normal rate and regular rhythm. S1 and S2 normal without gallop, murmur, click, rub or other extra sounds. Abdomen:  abdomen soft except mild firmness noted ant portion of pannus.  Tender to mild palpation RUQ and RLQ.  Bruising noted middle of pannus, consistent with insulin injections.  + BS.  No HSM noted, but exam severely limited by habitus. Skin:  2 cm stage 2 decubitus ulcer noted L  buttock.  + surrounding cream.  No covering, but pt states RN is coming today to attend to wound.    Impression & Recommendations:  Problem # 1:  ABDOMINAL PAIN, GENERALIZED (ICD-789.07)  Unclear etiology.  Has new med from GI for gallstones, can't remember what it is.  Firmness noted on abd exam, but CT has been negative, so this is likely adipose tissue.  Not a surgical candidate.  Pt able to tolerate the pain.   Her updated medication list for this problem includes:    Metoclopramide Hcl 5 Mg Tabs (Metoclopramide hcl) .Marland Kitchen... 1 by mouth before meals and qhs  Orders: [facility]- Est  Level 4 (04540)  Problem # 2:  ABNORMAL CHEST XRAY (ICD-793.1) CT done and shadowing was only plerual fat.  No further intervention needed.  Problem # 3:  FATIGUE (ICD-780.79)  Check CBC today  Orders: CBC-[facility] (98119) [facility]- Est  Level 4 (14782)  Problem # 4:  DECUBITUS ULCER, BUTTOCK (ICD-707.05)  No dressing in place today, but RN will do in a few hours.  Followed twice weekly by [facility] RN.    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  NO distress today.  Orders: [facility]- Est  Level 4 (16109)  Problem # 6:  UNSPECIFIED FAMILY CIRCUMSTANCE (ICD-V61.9)  Pt states she feels safe at home.  [name] threatens to leave, but she says he has no where to go, so she thinks he will stay and care for her.    Orders: [facility]- Est  Level 4 (60454)  Problem # 7:  HYPERTENSION, BENIGN SYSTEMIC (ICD-401.1)  Reasonable control.  No change in regimen Her updated medication list for this problem includes:    Torsemide 20 Mg Tabs (Torsemide) .Marland Kitchen... 1 by mouth two times a day for fluid    Lisinopril 5 Mg Tabs (Lisinopril) ..... 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  Pt limited in eating by what son prepares for her. Unable to draw up her own insulin  due to impaired vision, so unable to follow a sliding scale when she is alone during the day.  Continue current regimen. Her updated medication list for this problem includes:    Lantus 100 Unit/ml Soln (Insulin glargine) ..... Inject 55 units subcutaneously once a day disp 3 months    Relion R 100 Unit/ml Soln (Insulin regular human) ..... Inject 18  units subcutaneously three times a day before meals.  dispense quantity sufficient for one month supply.    Aspirin Adult Low Strength 81 Mg Tbec (Aspirin) .Marland Kitchen... 1 by mouth once daily    Lisinopril 5 Mg Tabs (Lisinopril) ..... One daily  Orders: [facility]- Est  Level 4 (99214)  Complete Medication List: 1)  Lantus 100 Unit/ml Soln (Insulin glargine) .... Inject 55 units subcutaneously once a day disp 3 months 2)  Relion R 100 Unit/ml Soln (Insulin regular human) .... Inject 18  units subcutaneously three times a day before meals.  dispense quantity sufficient for one month supply. 3)  Zocor 20 Mg Tabs (Simvastatin) .... Take 1 tablet by mouth every night 4)  Senior Multivitamin Plus Tabs (Multiple vitamins-minerals) .Marland Kitchen.. 1 by mouth qd 5)  Aspirin Adult Low Strength 81 Mg Tbec (Aspirin) .Marland Kitchen.. 1 by mouth once daily 6)  Lorazepam 0.5 Mg Tabs (Lorazepam) .... One two times a day as needed for anxiety 7)  Ms Contin 15 Mg Xr12h-tab (Morphine sulfate) .... Take 1 by mouth two times a day for back pain. 8)  Torsemide 20 Mg Tabs (Torsemide) .Marland Kitchen.. 1 by mouth two times a day for fluid 9)  Percocet 10-325 Mg Tabs (Oxycodone-acetaminophen) .... One tab three times a day 10)  Lisinopril 5 Mg Tabs (Lisinopril) .... One daily 11)  Nystatin 100000 Unit/gm Powd (Nystatin) .... Apply to affected area bid 12)  Iron 325 (65 Fe) Mg Tabs (Ferrous sulfate) .... Take 1 by mouth daily for low iron 13)  Promethazine Hcl 25 Mg Tabs (Promethazine hcl) .... Take 1 by mouth every 6 hours as needed for nausea 14)  Senokot 8.6 Mg Tabs (Sennosides) .... 3 tablets by mouth daily for  constipation 15)  Sorbitol 70 % Soln (Sorbitol) .... Take 2 tbsp every 2 hours until bm, then take 2 tbsp daily-qs 16)  Erythromycin Base 250 Mg Tabs (Erythromycin base) .... Take 1 by mouth 30 minutes before meals 17)  Pyridium 200 Mg Tabs (Phenazopyridine hcl) .... Take 1 by mouth 3 times a day for 2 days. 18)  Ciprofloxacin Hcl 250 Mg Tabs (Ciprofloxacin hcl) .Marland Kitchen.. 1 by  mouth two times a day for urine infection. 19)  Omeprazole 20 Mg Tbec (Omeprazole) .Marland Kitchen.. 1 by mouth two times a day for stomach pain. 20)  Metoclopramide Hcl 5 Mg Tabs (Metoclopramide hcl) .Marland Kitchen.. 1 by mouth before meals and qhs 21)  Fluconazole 150 Mg Tabs (Fluconazole) .Marland Kitchen.. 1 by mouth once 22)  Nystatin 100000 Unit/gm Powd (Nystatin) .... Apply to affected area bid 23)  Nystatin 100000 Unit/gm Crea (Nystatin) .... Apply to affected area bid Prescriptions: OMEPRAZOLE 20 MG TBEC (OMEPRAZOLE) 1 by mouth two times a day for stomach pain.  #120 x 3   Entered and Authorized by:   Sarah Swaziland MD   Signed by:   Sarah Swaziland MD on 04/12/2010   Method used:   Faxed to ...       [facility] (retail)       [address]       [place], [place]  [address]       Ph: [phone]       Fax: [phone]   RxID:   [id number]

## 2010-11-15 NOTE — Miscellaneous (Signed)
Summary: order for CT scan  Clinical Lists Changes  Problems: Added new problem of ABNORMAL CHEST XRAY (ICD-793.1) - Signed Orders: Added new Test order of CT with Contrast (CT w/ contrast) - Signed Added new Test order of Basic Met-[facility] [phone]) - Signed  Last Creatinine was 0.95 on 11/28/09

## 2010-11-15 NOTE — Assessment & Plan Note (Signed)
Summary: h/fup,tcb   Vital Signs:  Patient profile:   70 year old female Weight:      299.8 pounds Temp:     99 degrees F Pulse rate:   68 / minute BP sitting:   145 / 65  (left arm) Cuff size:   large  Vitals Entered By: Loralee Pacas CMA (June 25, 2010 11:02 AM)  Primary Care Provider:  Sarah Swaziland MD   History of Present Illness: 52 you female here for hospital follow up for NSTEMI with drug eluting stent placed.  REquesting pain meds and lorazepam.  Breathing doing a little better.    Feels jittery and nervous.  Sugar went down to 88 and to 75 - states she gets sweaty and jittery when this happen.Insulin increased in hospital to 65 units of lantus nightly, now glucose ranges 75-160 fasting and 123-142 post prandial.    Reports [name] has been giving her 2 percocets three times a day to help with her pain.  She reports that Dr. Maureen Ralphs used to give her 2 tablets.  Review of records reveals these were 5 mg tablets.  Advised Ms. Pflum that she should not take 2 of the percocets that she has now.          Allergies: 1)  ! Amoxicillin 2)  ! Ibuprofen 3)  ! Haldol 4)  Naprosyn (Naproxen) 5)  Norvasc (Amlodipine Besylate)  Review of Systems       see HPI  Physical Exam  General:  Obese, in wheelchair,in no acute distress; alert,appropriate and cooperative throughout examination   Impression & Recommendations:  Problem # 1:  RENAL INSUFFICIENCY, ACUTE (ICD-585.9) LIkely contrast induced from difficult cardiac cath.  Recheck renal fxn today Orders: Basic Met[facility] ([address]-60454) [facility]- Est Level  3 (09811)  Problem # 2:  CAD (ICD-414.00)  Stent placed during hospitalization.. Continue with clopidigrel and ASA Her updated medication list for this problem includes:    Aspirin Adult Low Strength 81 Mg Tbec (Aspirin) .Marland Kitchen... 1 by mouth once daily    Torsemide 20 Mg Tabs (Torsemide) .Marland Kitchen... 1 by mouth two times a day for fluid    Lisinopril 5 Mg Tabs (Lisinopril)  ..... One daily  Orders: [facility]- Est Level  3 (91478)  Problem # 3:  UNSPECIFIED FAMILY CIRCUMSTANCE (ICD-V61.9)  Discussed end of life care with pt. She is very clear that she would not like to be intubated or on "machines" or to have feeding tubes.  Discussed CPR and recussitation with patient, and she does not want that.  However, she is unwilling to go against her son's wishes.  He feels it would be better to have her on machines such as a ventilator if the situation arises so that her daughter in [place] would be able to say good-bye, and that he is able to withdraw support.  Pt's other daughter, Zella Ball, has told pt that she would not be able to "turn the machines off", but that she would make the son do it because she knows that would be the patient's wishes.  Son has healthcare POA. Pt would like her status to be full code at this time.  Orders: [facility]- Est Level  3 (29562)  Complete Medication List: 1)  Lantus 100 Unit/ml Soln (Insulin glargine) .... Inject 55 units subcutaneously once a day disp 3 months 2)  Relion R 100 Unit/ml Soln (Insulin regular human) .... Inject 18  units subcutaneously three times a day before meals.  dispense quantity sufficient for one month  supply. 3)  Zocor 20 Mg Tabs (Simvastatin) .... Take 1 tablet by mouth every night 4)  Senior Multivitamin Plus Tabs (Multiple vitamins-minerals) .Marland Kitchen.. 1 by mouth qd 5)  Aspirin Adult Low Strength 81 Mg Tbec (Aspirin) .Marland Kitchen.. 1 by mouth once daily 6)  Lorazepam 0.5 Mg Tabs (Lorazepam) .... One two times a day as needed for anxiety 7)  Ms Contin 15 Mg Xr12h-tab (Morphine sulfate) .... Take 1 by mouth two times a day for back pain. 8)  Torsemide 20 Mg Tabs (Torsemide) .Marland Kitchen.. 1 by mouth two times a day for fluid 9)  Percocet 10-325 Mg Tabs (Oxycodone-acetaminophen) .... One tab three times a day 10)  Lisinopril 5 Mg Tabs (Lisinopril) .... One daily 11)  Nystatin 100000 Unit/gm Powd (Nystatin) .... Apply to affected area bid 12)  Iron  325 (65 Fe) Mg Tabs (Ferrous sulfate) .... Take 1 by mouth daily for low iron 13)  Promethazine Hcl 25 Mg Tabs (Promethazine hcl) .... Take 1 by mouth every 6 hours as needed for nausea 14)  Senokot 8.6 Mg Tabs (Sennosides) .... 3 tablets by mouth daily for constipation 15)  Sorbitol 70 % Soln (Sorbitol) .... Take 2 tbsp every 2 hours until bm, then take 2 tbsp daily-qs 16)  Erythromycin Base 250 Mg Tabs (Erythromycin base) .... Take 1 by mouth 30 minutes before meals 17)  Pyridium 200 Mg Tabs (Phenazopyridine hcl) .... Take 1 by mouth 3 times a day for 2 days. 18)  Ciprofloxacin Hcl 250 Mg Tabs (Ciprofloxacin hcl) .Marland Kitchen.. 1 by mouth two times a day for urine infection. 19)  Omeprazole 20 Mg Tbec (Omeprazole) .Marland Kitchen.. 1 by mouth two times a day for stomach pain. 20)  Metoclopramide Hcl 5 Mg Tabs (Metoclopramide hcl) .Marland Kitchen.. 1 by mouth before meals and qhs 21)  Fluconazole 150 Mg Tabs (Fluconazole) .Marland Kitchen.. 1 by mouth once 22)  Nystatin 100000 Unit/gm Powd (Nystatin) .... Apply to affected area bid 23)  Nystatin 100000 Unit/gm Crea (Nystatin) .... Apply to affected area bid 24)  Carafate 1 Gm Tabs (Sucralfate) .Marland Kitchen.. 1 by mouth three times a day before meals 25)  Dexilant 60 Mg Cpdr (Dexlansoprazole) .Marland Kitchen.. 1 by mouth daily Prescriptions: LORAZEPAM 0.5 MG TABS (LORAZEPAM) one two times a day as needed for anxiety  #60 x 0   Entered and Authorized by:   Sarah Swaziland MD   Signed by:   Sarah Swaziland MD on 06/25/2010   Method used:   Print then Give to Patient   RxID:   [id number]

## 2010-11-15 NOTE — Assessment & Plan Note (Signed)
Summary: F/U per [name] / [facility]   Vital Signs:  Patient profile:   70 year old female Height:      66 inches  Primary Care Provider:  Sarah Swaziland MD   History of Present Illness: Assessment:  Saw pt for 30 minutes.  Shristi said her appetite is poor all the time now.  She is in constant pain.  Will follow up w/ GI for gallstones June 23.  24-hr recall suggests kcal intake of  ~1,000: B (AM)- 2 eggs in spray oil, 1 c oatmeal w/ 1 c fruit cocktail, 2 c fat-free milk, 1 c coffee w/ hazelnut creamer, Sweet 'n Low; L (12 PM)- part of oatmeal-raisin cookie, 1/4 chx salad sandwich, 2 fat-free milk, Crystal Light; D (9 PM)- 1 c broccoli, 1 bite canned pork, 1 tbsp rice, 1 tbsp corn, 1 tbsp navy beans, 2 c fat-free milk.  Record of blood glucose levels indicates a bit higher average than a month ago:  in the past 3 weeks, there were 11 FBG >200, and average FBG was 210.  Although FBG was >200 on 10  of past 12 days, Armando could not think of anything she is doing differently vs. the previous 9 days when she had only 2 >200.    Nutrition Diagnosis:  No changes in physical inactivity (NB-2.1) related to disability as evidenced by wheelchair requirement.  No progress on imbalance of nutrients (NI5.5) related to carbohydrates as evidenced by only 1 FBG <160 in past 3 wk, and increasing average FBG of 210 (vs 177 last time).     Intervention:  See Patient Instructions.   Monitoring/Eval: Dietary intake and exercise at F/U in July.      Allergies: 1)  ! Amoxicillin 2)  ! Ibuprofen 3)  ! Haldol 4)  Naprosyn (Naproxen) 5)  Norvasc (Amlodipine Besylate)   Complete Medication List: 1)  Lantus 100 Unit/ml Soln (Insulin glargine) .... Inject 55 units subcutaneously once a day disp 3 months 2)  Relion R 100 Unit/ml Soln (Insulin regular human) .... Inject 18  units subcutaneously three times a day before meals.  dispense quantity sufficient for one month supply. 3)  Zocor 20 Mg Tabs (Simvastatin) ....  Take 1 tablet by mouth every night 4)  Senior Multivitamin Plus Tabs (Multiple vitamins-minerals) .Marland Kitchen.. 1 by mouth qd 5)  Aspirin Adult Low Strength 81 Mg Tbec (Aspirin) .Marland Kitchen.. 1 by mouth once daily 6)  Lorazepam 0.5 Mg Tabs (Lorazepam) .... One two times a day as needed for anxiety 7)  Ms Contin 15 Mg Xr12h-tab (Morphine sulfate) .... Take 1 by mouth two times a day for back pain. 8)  Torsemide 20 Mg Tabs (Torsemide) .Marland Kitchen.. 1 by mouth two times a day for fluid 9)  Percocet 10-325 Mg Tabs (Oxycodone-acetaminophen) .... One tab three times a day 10)  Lisinopril 5 Mg Tabs (Lisinopril) .... One daily 11)  Nystatin 100000 Unit/gm Powd (Nystatin) .... Apply to affected area bid 12)  Iron 325 (65 Fe) Mg Tabs (Ferrous sulfate) .... Take 1 by mouth daily for low iron 13)  Promethazine Hcl 25 Mg Tabs (Promethazine hcl) .... Take 1 by mouth every 6 hours as needed for nausea 14)  Senokot 8.6 Mg Tabs (Sennosides) .... 3 tablets by mouth daily for constipation 15)  Sorbitol 70 % Soln (Sorbitol) .... Take 2 tbsp every 2 hours until bm, then take 2 tbsp daily-qs 16)  Erythromycin Base 250 Mg Tabs (Erythromycin base) .... Take 1 by mouth 30 minutes before meals  17)  Pyridium 200 Mg Tabs (Phenazopyridine hcl) .... Take 1 by mouth 3 times a day for 2 days. 18)  Ciprofloxacin Hcl 250 Mg Tabs (Ciprofloxacin hcl) .Marland Kitchen.. 1 by mouth two times a day for urine infection. 19)  Omeprazole 20 Mg Tbec (Omeprazole) .Marland Kitchen.. 1 by mouth two times a day for stomach pain. 20)  Metoclopramide Hcl 5 Mg Tabs (Metoclopramide hcl) .Marland Kitchen.. 1 by mouth before meals and qhs 21)  Fluconazole 150 Mg Tabs (Fluconazole) .Marland Kitchen.. 1 by mouth once 22)  Nystatin 100000 Unit/gm Powd (Nystatin) .... Apply to affected area bid  Other Orders: Reassessment Each 15 min unit- [facility] [phone])  Patient Instructions: 1)  YOUR WEIGHT IS DOWN 24 LB FROM JUNE 2010.  THAT'S GREAT! 2)  WHEN YOU DON'T FEEL LIKE EATING A MEAL, AT LEAST TRY TO GET YOUR VEG'S OR FRUIT AND SOME  PROTEIN.   3)  ASK YOUR AIDE TO CALL DR. [name] TO TELL ME THE NUTRITION FACTS ON THE HAZELNUT CREAMER:  SERVING SIZE, CALORIES, TOTAL CARBOHYDRATE, TOTAL FAT:  [phone]. 4)  LIMITING YOUR FAT INTAKE MAY HELP YOU FEEL BETTER (GALL BLADDER).   5)  CALL FOR A FOLLOW-UP NUTRITION APPOINTMENT WHEN WE HAVE OUR JULY SCHEDULE OUT:  [phone].     Vital Signs:  Patient Profile:   70 year old female Height:     66 inches (165.10 cm)

## 2010-11-15 NOTE — Miscellaneous (Signed)
Summary: phone call from [facility] nurse  Clinical Lists Changes Holly Kim, a nurse with [facility] called to report the pt had wet, crackly, wheezy lungs. she c/o abd pain. screamed when nurse started to palpate it. consitpated, ears hurt & nose congested. checked with Dr. Swaziland & advised pt to call 911 & go to ED. pt refused & said "I'll be alright" wants to wait for appt thursday. to pcp to see if she can convince her to go to ED.[facility] RN  November 21, 2009 1:54 PM Called pt and talked with her.  She states the home care nurse "got too excited".  Holly Kim is using her O2, but not feeling particularly short of breath.  She denies chest pain or pressure now, and says she just had a little bit earlier.  She feels sore all over after a fall last week and states that her pain in abdomen is not worse than usual.   I explained that I was concerned this could be a heart attack, pt understands that a heart attack can kill her.  She refuses evaluation because she is sure she will be fine and she "hates riding in ambulances".  She is planning on keeping her appt on Thursday with [name].  She agrees that if she feels the chest pain again or if she developes shortness of breath, she will call an ambulance and go to the ED. Sarah Swaziland MD  November 21, 2009 2:26 PM
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
## 2010-11-15 NOTE — Assessment & Plan Note (Signed)
Summary: nutr. appt/eo   Vital Signs:  Patient profile:   70 year old female Height:      66 inches  Vitals Entered By: Wyona Almas PHD (December 18, 2009 11:36 AM)  Primary Care Provider:  Sarah Swaziland MD   History of Present Illness: Assessment: Spent 30 minutes with pt.  Holly Kim said her appetite is poor d/t stomach pain.  She said she is eating about the same as before, and is managing to get veg's at least once a day, but is eating much less. She taking phenergan 1-2 X day.  She is having bowel movements at least every other day, and yesterday she started having severe pain upon urinating.  Lolitha said her pain is constant, and seems to be localized to her upper right quadrant.  Blood sugars in the past week look improved; no fasting over 199, with lowest of 51 at 2:30 PM on Mar 5, explained by only an orange for lunch that day.    Nutrition Diagnosis:  No changes in physical inactivity (NB-2.1) related to disability as evidenced by wheelchair requirement.  No progress on imbalance of nutrients (NI5.5) related to carbohydrates as evidenced by continued inclusion of  prune juice periodically and by occasional sweets such as cake and cookies evident in food record.    Intervention:  We reviewed prior food goals, but I did not make specific recommendations at this time b/c current chronic pain is preventing Thresia from eating normally.   Monitoring/Eval: Dietary intake and exercise at 5-wk F/U.    Allergies: 1)  ! Amoxicillin 2)  ! Ibuprofen 3)  ! Haldol 4)  Naprosyn (Naproxen) 5)  Norvasc (Amlodipine Besylate)   Complete Medication List: 1)  Lantus 100 Unit/ml Soln (Insulin glargine) .... Inject 55 units subcutaneously once a day disp 3 months 2)  Prilosec Otc 20 Mg Tbec (Omeprazole magnesium) .... Take 1 tablet by mouth every morning 3)  Relion R 100 Unit/ml Soln (Insulin regular human) .... Inject 18  units subcutaneously three times a day before meals.  dispense  quantity sufficient for one month supply. 4)  Zocor 20 Mg Tabs (Simvastatin) .... Take 1 tablet by mouth every night 5)  Senior Multivitamin Plus Tabs (Multiple vitamins-minerals) .Marland Kitchen.. 1 by mouth qd 6)  Aspirin Adult Low Strength 81 Mg Tbec (Aspirin) .Marland Kitchen.. 1 by mouth once daily 7)  Lorazepam 0.5 Mg Tabs (Lorazepam) .... One two times a day as needed for anxiety 8)  Ms Contin 15 Mg Xr12h-tab (Morphine sulfate) .... Take 1 by mouth two times a day for back pain. 9)  Torsemide 20 Mg Tabs (Torsemide) .Marland Kitchen.. 1 by mouth two times a day for fluid 10)  Percocet 10-325 Mg Tabs (Oxycodone-acetaminophen) .... One tab three times a day 11)  Lisinopril 5 Mg Tabs (Lisinopril) .... One daily 12)  Nystatin 100000 Unit/gm Powd (Nystatin) .... Apply to affected area bid 13)  Iron 325 (65 Fe) Mg Tabs (Ferrous sulfate) .... Take 1 by mouth daily for low iron 14)  Promethazine Hcl 25 Mg Tabs (Promethazine hcl) .... Take 1 by mouth every 6 hours as needed for nausea 15)  Senokot 8.6 Mg Tabs (Sennosides) .... 3 tablets by mouth daily for constipation 16)  Sorbitol 70 % Soln (Sorbitol) .... Take 2 tbsp every 2 hours until bm, then take 2 tbsp daily-qs  Other Orders: Reassessment Each 15 min unit- [facility] (62130)  Appended Document: nutr. appt, continued abd pain Pt still having abd pain, now with c/o urinary problems -  pain when she urinates and feeling of cold chill when she urinates.  No fevers.  Unable to eval urine today because pt is not safe for getting to exam table for cath ua.  Unlikely to obtain adequate specimen if clean catch is attempted, and pt not safe to transfer from chair to toilet and back without assistance.  Called [facility] to obtain cath UA/CX. Reviewed CT scan.  No acute findings to explain her pain.  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  Will try emycin for motility. Pt to follow up 3/17, sooner if needed. Sarah Swaziland MD  December 18, 2009 1:57 PM    Clinical Lists  Changes  Medications: Added new medication of ERYTHROMYCIN BASE 250 MG TABS (ERYTHROMYCIN BASE) Take 1 by mouth 30 minutes before meals - Signed Rx of ERYTHROMYCIN BASE 250 MG TABS (ERYTHROMYCIN BASE) Take 1 by mouth 30 minutes before meals;  #90 x 0;  Signed;  Entered by: Sarah Swaziland MD;  Authorized by: Sarah Swaziland MD;  Method used: Faxed to [facility], [address] [place], [place], [place]  [address], Ph: [phone], Fax: [phone] Rx of PROMETHAZINE HCL 25 MG TABS (PROMETHAZINE HCL) Take 1 by mouth every 6 hours as needed for nausea;  #45 x 0;  Signed;  Entered by: Sarah Swaziland MD;  Authorized by: Sarah Swaziland MD;  Method used: Faxed to [facility], [address] [place], [place], [place]  [address], Ph: [phone], Fax: [phone]    Prescriptions: PROMETHAZINE HCL 25 MG TABS (PROMETHAZINE HCL) Take 1 by mouth every 6 hours as needed for nausea  #45 x 0   Entered and Authorized by:   Sarah Swaziland MD   Signed by:   Sarah Swaziland MD on 12/18/2009   Method used:   Faxed to ...       [facility] (retail)       [address]       [place], [place]  [address]       Ph: [phone]       Fax: [phone]   RxID:   [id number] ERYTHROMYCIN BASE 250 MG TABS (ERYTHROMYCIN BASE) Take 1 by mouth 30 minutes before meals  #90 x 0   Entered and Authorized by:   Sarah Swaziland MD   Signed by:   Sarah Swaziland MD on 12/18/2009   Method used:   Faxed to ...       [facility] (retail)       [address]       [place], [place]  [address]       Ph: [phone]       Fax: [phone]   RxID:   [id number]

## 2010-11-15 NOTE — Progress Notes (Signed)
Summary: called with GI results  Phone Note Outgoing Call   Call placed by: Tempest Frankland Swaz[place]and MD,  January 25, 2010 1:33 PM Summary of Call: Spoke with Holly Kim.  He is unaware of Holly Kim having any follow up testing from GI.  We will confirm with the GI folks and consider an abdominal ultrasound if they have not already scheduled for her.     Appended Document: called with GI results pt returned call - pls call her back  Appended Document: called with GI results Spoke with Holly Kim.  She states she had the small bowel follow through and they have an appt scheduled for her May 3rd.  She is taking the higher doses of Prilosec.  Still with abdominal pain which is unchanged.  Will call in the higher doses of Prilosec.  {USER.REALNAME}  {DATETIMESTAMP()}

## 2010-11-15 NOTE — Progress Notes (Signed)
Summary: Rx Req  Phone Note Refill Request Call back at Home Phone [phone] Message from:  Patient  Refills Requested: Medication #1:  PRILOSEC OTC 20 MG TBEC Take 1 tablet by mouth every morning  Medication #2:  LORAZEPAM 0.5 MG TABS one two times a day as needed for anxiety Pt uses [facility]  Initial call taken by: Clydell Hakim,  November 29, 2009 10:20 AM Caller: Patient  Follow-up for Phone Call        Rx in computer.

## 2010-11-15 NOTE — Progress Notes (Signed)
Summary: triage- ear pain.  appt with [facility]  Phone Note Call from Patient   Summary of Call: wants to know what she can do about ear pain.  had to cxl appt w/ Sw[place] on Thurs b/c of another appt. Initial call taken by: De Nurse,  October 30, 2009 11:39 AM  Follow-up for Phone Call        told her she needs to see a doctor. her pcp is not here. she wants to see a woman md. will see Dr. Earnest Bailey tomorrow at 2. she is taking her pain meds & using warm cloths to affected ear Follow-up by: Golden Circle RN,  October 30, 2009 11:42 AM

## 2010-11-15 NOTE — Progress Notes (Signed)
Summary: pls call  Phone Note Call from Patient Call back at Home Phone [phone]   Caller: Patient Summary of Call: needs to talk to Dr Swaziland about her prescriptions Initial call taken by: De Nurse,  September 03, 2010 2:44 PM  Follow-up for Phone Call        pt called again Follow-up by: De Nurse,  September 04, 2010 9:58 AM  Additional Follow-up for Phone Call Additional follow up Details #1::        Returned pt's call.  She is requesting 120 oxycodone/apap a month. She was on 60 per month, but when she hurt her leg, we increased the number to treat the leg pain.  At our last appt, she told me that she has been taking them three times a day.  While we were talking on the phone today, her son was in the background reporting that she takes them 4 times a day.  He said that she really needs one at night time - that she was crying in pain at 3 am last night. Ms. Milkovich is requesting them for the pain she feels 'all over", including abdominal pain and leg pain. I reviewed the side effects and concern I have about these high doses of narcotics.  I emphasized that the increase in meds was just to treat her acute leg injury, and that we need to decrease her acute meds.  Will keep her at three times a day dosing and also try acid supression with an H2 blocker (omeprazole stopped due to pt being started on clopidigrel after stent placed.). Pt voiced understanding, will contact us if her pain needs further attention. Additional Follow-up by: Sarah Swaziland MD,  September 05, 2010 9:43 AM

## 2010-11-15 NOTE — Miscellaneous (Signed)
Summary: [facility] phone number  Clinical Lists [employer] Phone # [phone].

## 2010-11-15 NOTE — Progress Notes (Signed)
Summary: refill  Phone Note Refill Request Call back at Home Phone [phone] Message from:  Patient  Refills Requested: Medication #1:  NYSTATIN 100000 UNIT/GM POWD Apply to affected area BID  Medication #2:  LORAZEPAM 0.5 MG TABS one two times a day as needed for anxiety Initial call taken by: De Nurse,  Mar 01, 2010 2:07 PM  Follow-up for Phone Call        Attempted to print so I could have rx faxed to the pharmacy.  I see that in a subsequent notes indicate that this has been taken care of.   Follow-up by: Sarah Swaziland MD,  Mar 02, 2010 1:52 PM    Prescriptions: NYSTATIN 100000 UNIT/GM POWD (NYSTATIN) Apply to affected area BID  #1 bottle x 3   Entered and Authorized by:   Sarah Swaziland MD   Signed by:   Sarah Swaziland MD on 03/02/2010   Method used:   Printed then faxed to ...       [facility] (retail)       [address]       [place], [place]  [address]       Ph: [phone]       Fax: [phone]   RxID:   [id number] LORAZEPAM 0.5 MG TABS (LORAZEPAM) one two times a day as needed for anxiety  #60 x 0   Entered and Authorized by:   Sarah Swaziland MD   Signed by:   Sarah Swaziland MD on 03/02/2010   Method used:   Printed then faxed to ...       [facility] (retail)       [address]       [place], [place]  [address]       Ph: [phone]       Fax: [phone]   RxID:   [id number] NYSTATIN 100000 UNIT/GM POWD (NYSTATIN) Apply to affected area BID  #1 bottle x 1   Entered and Authorized by:   Sarah Swaziland MD   Signed by:   Sarah Swaziland MD on 03/02/2010   Method used:   Print then Give to Patient   RxID:   [id number] LORAZEPAM 0.5 MG TABS (LORAZEPAM) one two times a day as needed for anxiety  #60 x 0   Entered and Authorized by:   Sarah Swaziland MD   Signed by:   Sarah Swaziland MD on 03/02/2010   Method used:   Print then Give to Patient   RxID:   [id number]

## 2010-11-15 NOTE — Assessment & Plan Note (Signed)
Summary: F/U VISIT/[facility]   Vital Signs:  Patient profile:   70 year old female Height:      66 inches Weight:      285.6 pounds BMI:     46.26 Temp:     98.2 degrees F oral Pulse rate:   69 / minute BP sitting:   119 / 55  (left arm) Cuff size:   large  Vitals Entered By: Gladstone Pih (January 23, 2010 11:02 AM) CC: F/U DM Is Patient Diabetic? Yes Did you bring your meter with you today? No Pain Assessment Patient in pain? no        Primary Care Provider:  Shadia Larose [place] MD  CC:  F/U DM.  History of Present Illness: Still with abdominal pain.  States she went to the GI appt on Friday and they sent her back to [name].  Per pt, she was told she could not tolerate a colonoscopy.  Pt upset that we have not gotten report yet. Breathing problems.  On O2.  Unsure if worse.  May be the same.  No chest pain Sugars range from 50s to 300s.  Drinks chocolate milk when low.  Drinking lots of milk to try to help abd pain. Feels good about weight loss.  Habits & Providers  Alcohol-Tobacco-Diet     Tobacco Status: never  Allergies: 1)  ! Amoxicillin 2)  ! Ibuprofen 3)  ! Haldol 4)  Naprosyn (Naproxen) 5)  Norvasc (Amlodipine Besylate)  Review of Systems       see HPI  Physical Exam  General:  Obese. In wheelchair.  Alert and interactive.   Lungs:  Crackles B, more prominent on L. No wheeze.  Moderate air movement (similar to prior exams).  Heart:  Normal rate and regular rhythm. S1 and S2 normal without gallop, murmur, click, rub or other extra sounds.   Impression & Recommendations:  Problem # 1:  SHORTNESS OF BREATH (ICD-786.05) Unsure if pt worsening or if stable.  Crackles are more prominent that previous visits.  Check CXR.  Pt cannot do today - transport won't take her.  Will go tomorrow.  Reviewed precautions. Orders: CXR- 2view (CXR) [facility]- Est Level  3 ([address])  Problem # 2:  ABDOMINAL PAIN, GENERALIZED (ICD-789.07)  Will request records from GI.  Pt feels her pain is  ok for now; she will be ok for a few days until we get the records.  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  No change in meds.  Awaiting eye exam report. Her updated medication list for this problem includes:    Lantus 100 Unit/ml Soln (Insulin glargine) ..... Inject 55 units subcutaneously once a day disp 3 months    Relion R 100 Unit/ml Soln (Insulin regular human) ..... Inject 18  units subcutaneously three times a day before meals.  dispense quantity sufficient for one month supply.    Aspirin Adult Low Strength 81 Mg Tbec (Aspirin) .Marland Kitchen... 1 by mouth once daily    Lisinopril 5 Mg Tabs (Lisinopril) ..... One daily  Orders: A1C-[facility] (09811) [facility]- Est Level  3 (9[address]78)  Complete Medication List: 1)  Lantus 100 Unit/ml Soln (Insulin glargine) .... Inject 55 units subcutaneously once a day disp 3 months 2)  Prilosec Otc 20 Mg Tbec (Omeprazole magnesium) .... Take 1 tablet by mouth every morning 3)  Relion R 100 Unit/ml Soln (Insulin regular human) .Marland KitchenMarland KitchenMarland Kitchen  Inject 18  units subcutaneously three times a day before meals.  dispense quantity sufficient for one month supply. 4)  Zocor 20 Mg Tabs (Simvastatin) .... Take 1 tablet by mouth every night 5)  Senior Multivitamin Plus Tabs (Multiple vitamins-minerals) .Marland Kitchen.. 1 by mouth qd 6)  Aspirin Adult Low Strength 81 Mg Tbec (Aspirin) .Marland Kitchen.. 1 by mouth once daily 7)  Lorazepam 0.5 Mg Tabs (Lorazepam) .... One two times a day as needed for anxiety 8)  Ms Contin 15 Mg Xr12h-tab (Morphine sulfate) .... Take 1 by mouth two times a day for back pain. 9)  Torsemide 20 Mg Tabs (Torsemide) .Marland Kitchen.. 1 by mouth two times a day for fluid 10)  Percocet 10-325 Mg Tabs (Oxycodone-acetaminophen) .... One tab three times a day 11)  Lisinopril 5 Mg Tabs (Lisinopril) .... One daily 12)  Nystatin 100000 Unit/gm Powd (Nystatin) .... Apply to affected area bid 13)  Iron 325 (65  Fe) Mg Tabs (Ferrous sulfate) .... Take 1 by mouth daily for low iron 14)  Promethazine Hcl 25 Mg Tabs (Promethazine hcl) .... Take 1 by mouth every 6 hours as needed for nausea 15)  Senokot 8.6 Mg Tabs (Sennosides) .... 3 tablets by mouth daily for constipation 16)  Sorbitol 70 % Soln (Sorbitol) .... Take 2 tbsp every 2 hours until bm, then take 2 tbsp daily-qs 17)  Erythromycin Base 250 Mg Tabs (Erythromycin base) .... Take 1 by mouth 30 minutes before meals 18)  Pyridium 200 Mg Tabs (Phenazopyridine hcl) .... Take 1 by mouth 3 times a day for 2 days. 19)  Ciprofloxacin Hcl 250 Mg Tabs (Ciprofloxacin hcl) .Marland Kitchen.. 1 by mouth two times a day for urine infection.  Patient Instructions: 1)  I will call you when I get the GI report.   2)  Go to the radiology department and get a chest Xray.  Call us if you get any fevers. Prescriptions: PROMETHAZINE HCL 25 MG TABS (PROMETHAZINE HCL) Take 1 by mouth every 6 hours as needed for nausea  #60 x 0   Entered and Authorized by:   Anavey Coombes [place] MD   Signed by:   Avelynn Sellin [place] MD on 01/23/2010   Method used:   Faxed to ...       [facility] (retail)       [address]       [place], [place]  [address]       Ph: [phone]       Fax: [phone]   RxID:   [id number] PROMETHAZINE HCL 25 MG TABS (PROMETHAZINE HCL) Take 1 by mouth every 6 hours as needed for nausea  #60 x 0   Entered and Authorized by:   Yazid Pop [place] MD   Signed by:   Tyria Springer [place] MD on 01/23/2010   Method used:   Print then Give to Patient   RxID:   [id number] PERCOCET 10-325 MG TABS (OXYCODONE-ACETAMINOPHEN) one tab three times a day Brand medically necessary #90 x 0   Entered and Authorized by:   Kenden Brandt [place] MD   Signed by:   Mindee Robledo [place] MD on 01/23/2010   Method used:   Print then Give to Patient   RxID:   [id number] MS CONTIN 15 MG XR12H-TAB (MORPHINE SULFATE) Take 1 by mouth two times a day for back pain. Brand medically necessary  #60 x 0   Entered and Authorized by:   Zalayah Pizzuto [place] MD   Signed by:  Jaeleen Inzunza [place] MD on 01/23/2010   Method used:   Print then Give to Patient   RxID:   [id number]    Laboratory Results   Blood Tests   Date/Time Received: January 23, 2010 11:08 AM  Date/Time Reported: January 23, 2010 11:25 AM   HGBA1C: 6.4%   (Normal Range: Non-Diabetic - 3-6%   Control Diabetic - 6-8%)  Comments: ...............test performed by......Marland KitchenBonnie A. Swaziland, MLS (ASCP)cm

## 2010-11-15 NOTE — Progress Notes (Signed)
Summary: meds prob  Phone Note Refill Request Call back at Home Phone [phone] Message from:  Patient  Refills Requested: Medication #1:  PERCOCET 10-325 MG TABS one tab three times a day  Medication #2:  Holly CONTIN 15 MG XR12H-TAB Take 1 by mouth two times a day for back pain. pt states that pharm does not have any rx for these meds.  Initial call taken by: De Nurse,  June 25, 2010 5:02 PM  Follow-up for Phone Call        Holly Kim got her refills on 8/24 for these meds.  She was also in the hospital for several days, so she should not need more of them until last SEptember.  If she is out, then she is taking too many, as we discussedin clinic today. Follow-up by: Sarah [place] MD,  June 25, 2010 11:03 PM

## 2010-11-15 NOTE — Miscellaneous (Signed)
  Clinical Lists Changes  Orders: Added new Test order of [facility]- Est Level  3 (99213) - Signed 

## 2010-11-15 NOTE — Assessment & Plan Note (Signed)
Summary: continued abdominal pain.  Wants pain meds.  Nurse Visit   Vital Signs:  Patient profile:   70 year old female Pulse rate:   66 / minute BP sitting:   93 / 61  (left arm)  Vitals Entered By: Theresia Lo RN (September 17, 2010 10:36 AM)  Patient Instructions: 1)  We need to control your pain better.  In order to do this, we will increase your long acting pain meds and decrease your short acting medicines.  2)  Take 2 of the Morphine tablets twice a day.  When you run out, you can fill the prescription for the 30 mg tablets. 3)  Stop taking the 10 mg percocets.  If you have pain that is not controlled by the long acting morphine, you may take the 5 mg percocets to help with pain.  It is very important that you do not take the 10 mg percocets, becasue this will be [facility].  These medicines can have very serious side effects, including making you dizzy and more likely to fall, and also slowing down your breathing.     Impression & Recommendations:  Problem # 1:  ABDOMINAL PAIN, RIGHT UPPER QUADRANT (ICD-789.01)  Pt had similar pain in past that resolved during her hospitalization with a stent placement.  Unclear etiology of pain.  U/s reveals stones but no inflammation.  Pt with CAD, but pain not consistent with angina or GI ischemia as it is constant.  Pt known to have delayed gastric emptying and is on metoclopramide (after discussion of r/b of tardive dyskenisia).  Will order further w/u for cholelithiasis. Pt's social situation puts her at risk for med diversion.  When we discussed baseline pain management and break through pain management, I told her that 10 percocets 4 times a day indicated that we are not controlling her pain adequately, and that 120 percocets were too much for her to take.  She said that her daughter and son both got 120 every month.  We will increase her long acting meds and decrease the dose and frequency of her short acting meds (see problem list).  Follow  up in 3 days. I have obtained a UDS for Ms. Knisely recently.  I am unable to find it in the record, but it was positive for narcotics, so she is getting at least some of her meds.   Her updated medication list for this problem includes:    Metoclopramide Hcl 5 Mg Tabs (Metoclopramide hcl) .Marland Kitchen... 1 by mouth before meals and qhs  Orders: [facility]- Est Level  3 (01027) Nuclear Medicine (Nuclear Medicine)  Problem # 2:  OTHER SPECIFIED HOUSING/ECONOMIC CIRCUMSTANCES (ICD-V60.89)  Pt is being evicted.  I have offered to facilitate nursing home placement for her, which she has declined.  She is actively seeking a new place to live.   Orders: [facility]- Est Level  3 (25366)  Complete Medication List: 1)  Lantus 100 Unit/ml Soln (Insulin glargine) .... Inject 55 units subcutaneously once a day disp 3 months 2)  Relion R 100 Unit/ml Soln (Insulin regular human) .... Inject 18  units subcutaneously three times a day before meals.  dispense quantity sufficient for one month supply. 3)  Zocor 20 Mg Tabs (Simvastatin) .... Take 1 tablet by mouth every night 4)  Senior Multivitamin Plus Tabs (Multiple vitamins-minerals) .Marland Kitchen.. 1 by mouth qd 5)  Aspirin Adult Low Strength 81 Mg Tbec (Aspirin) .Marland Kitchen.. 1 by mouth once daily 6)  Lorazepam 0.5 Mg Tabs (  Lorazepam) .... One two times a day as needed for anxiety 7)  Torsemide 20 Mg Tabs (Torsemide) .Marland Kitchen.. 1 by mouth two times a day for fluid 8)  Lisinopril 5 Mg Tabs (Lisinopril) .... One daily 9)  Nystatin 100000 Unit/gm Powd (Nystatin) .... Apply to affected area bid 10)  Iron 325 (65 Fe) Mg Tabs (Ferrous sulfate) .... Take 1 by mouth daily for low iron 11)  Promethazine Hcl 25 Mg Tabs (Promethazine hcl) .... Take 1 by mouth every 6 hours as needed for nausea 12)  Senokot 8.6 Mg Tabs (Sennosides) .... 3 tablets by mouth daily for constipation 13)  Sorbitol 70 % Soln (Sorbitol) .... Take 2 tbsp every 2 hours until bm, then take 2 tbsp daily-qs 14)  Erythromycin Base 250 Mg  Tabs (Erythromycin base) .... Take 1 by mouth 30 minutes before meals 15)  Metoclopramide Hcl 5 Mg Tabs (Metoclopramide hcl) .Marland Kitchen.. 1 by mouth before meals and qhs 16)  Fluconazole 150 Mg Tabs (Fluconazole) .Marland Kitchen.. 1 by mouth once 17)  Nystatin 100000 Unit/gm Powd (Nystatin) .... Apply to affected area bid 18)  Nystatin 100000 Unit/gm Crea (Nystatin) .... Apply to affected area bid 19)  Carafate 1 Gm Tabs (Sucralfate) .Marland Kitchen.. 1 by mouth three times a day before meals 20)  Dexilant 60 Mg Cpdr (Dexlansoprazole) .Marland Kitchen.. 1 by mouth daily 21)  Plavix 75 Mg Tabs (Clopidogrel bisulfate) .Marland Kitchen.. 1 by mouth daily 22)  Crestor 40 Mg Tabs (Rosuvastatin calcium) .Marland Kitchen.. 1 by mouth q hs for cholesterol 23)  Ranitidine Hcl 150 Mg Caps (Ranitidine hcl) .Marland Kitchen.. 1 by mouth two times a day for stomach. 24)  Ms Contin 30 Mg Xr12h-tab (Morphine sulfate) .... Take 1 tablet twice daily.  start these after you run out of the other morphine tablets. 25)  Percocet 5-325 Mg Tabs (Oxycodone-acetaminophen) .... Take 1 two times a day if needed for severe pain   Physical Exam  General:  Obese, somewhat discheveled appearing.  Wheelchair bound.  Vitals noted.  No acute distress.  Humming softly during exam.  Holding hands over her RUQ. Lungs:  Moderate air movement throughout fields B.  No wheezes or crackles.   Heart:  Very distant sounds.  RRR no m/g/r appreciated. Abdomen:  Exam severely limited by body habitus.  1 small (0.5 cm) ulcer noted under R breast.  Inframammary skin moist with some erythema. + BS. Very TTP with light palpation and with auscultation RUQ.  No mass or HSM  appreciated, but my exam was limited by subcutaneous tissue.  No rebound or guarding. Psych:  Alert and oriented.  Slightly unkempt.  Became a little agitated when we discussed decreasing the percocets.  Does not appear depressed.  Appropriate when discussing upcoming eviction.      Primary Care Provider:  Elese Rane [place] MD  CC:  pain  right abdomen.  History  of Present Illness: 70 yo female with multiple medical problems here as a work in for burning abdominal pain.  RUQ, describes it as burning pain, like a knife stabbing through her.  Is taking her long acting morphine, and has increased her percocets to QID.  No N/V/D.  + Stable constipation with daily bowel movements.  No bleeding, no melena.  No fevers or chills.  No worsening with food.  Pain meds (percocet) relieve pain completely for a short while (2-3 hours) and then pain returns and gradually worsens to past 10/10.  Otherwise, pain is constant.  Movement does not exacerbate pain.  Nothing relieves pain except  percocets.    Pt had h/o similar pain in RLQ months ago, which improved after hospitalization with cardiac stent placement.  Current pain started few weeks ago when 2 lesions popped up on skin.  Lesions are improved, but pain continues.  No chest pain or trouble breathing.    Pt is being evicted from her apartment for not complying with the rules of the apartment complex.  She has been offered nursing home placement numerous times, and always refuses.  She does not know where she will go, but is to hear today from a possible placement.  She has to vacate her apartment this week, and [name] stay with her at her new place.    I asked pt about the safety of her meds, specifically, if anyone was selling them.  She states that no one is selling her meds "my meds are something we do not touch".    CC: pain  right abdomen Is Patient Diabetic? Yes Pain Assessment Patient in pain? yes     Location: right side Intensity: 8 Type: stabbing , burning   Habits & Providers  Alcohol-Tobacco-Diet     Tobacco Status: never  Allergies (verified): 1)  ! Amoxicillin 2)  ! Ibuprofen 3)  ! Haldol 4)  Naprosyn (Naproxen) 5)  Norvasc (Amlodipine Besylate)  Orders Added: 1)  [facility]- Est Level  3 [16109] 2)  Nuclear Medicine [Nuclear  Medicine] Prescriptions: PERCOCET 5-325 MG TABS (OXYCODONE-ACETAMINOPHEN) Take 1 two times a day if needed for severe pain  #15 x 0   Entered and Authorized by:   Quatavious Rossa [place] MD   Signed by:   Monisha Siebel [place] MD on 09/17/2010   Method used:   Print then Give to Patient   RxID:   [id number] MS CONTIN 30 MG XR12H-TAB (MORPHINE SULFATE) Take 1 tablet twice daily.  Start these after you run out of the other morphine tablets.  #60 x 0   Entered and Authorized by:   Eura Radabaugh [place] MD   Signed by:   Dondra Rhett [place] MD on 09/17/2010   Method used:   Print then Give to Patient   RxID:   [id number]
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
## 2010-11-15 NOTE — Progress Notes (Signed)
Summary: continue care verbal order to [facility]  Phone Note Other Incoming Call back at [phone]   Caller: [facility] Summary of Call: Needs orders to continue her care recert for 60 days. Initial call taken by: Clydell Hakim,  October 30, 2009 1:40 PM  Follow-up for Phone Call        forward to PCP Follow-up by: Gladstone Pih,  October 30, 2009 1:44 PM  Additional Follow-up for Phone Call Additional follow up Details #1::        Spoke with Elnita Maxwell and gave a verbal order to continue care.  They will send form to [name] to fill out but will continue care now. Additional Follow-up by: Lark Langenfeld Swaziland MD,  October 31, 2009 9:21 AM

## 2010-11-15 NOTE — Progress Notes (Signed)
Summary: Rx Prob  Phone Note Call from Patient Call back at Home Phone [phone]   Caller: Patient Summary of Call: Pt states that the pharmacy has been trying to reach [name] for an rx that she was perscribed while she was in the hospital. Initial call taken by: Clydell Hakim,  July 10, 2010 2:01 PM  Follow-up for Phone Call        [facility].  They state that the Avalox is not covered by her insurance.  Dr. Fara Boros dictated the hospital d/c summary so will route to her for possible substitution.   Follow-up by: Dennison Nancy RN,  July 10, 2010 2:40 PM  Additional Follow-up for Phone Call Additional follow up Details #1::        Forwarded to MD Additional Follow-up by: Garen Grams LPN,  July 10, 2010 3:06 PM    New/Updated Medications: LEVOFLOXACIN 750 MG TABS (LEVOFLOXACIN) 1 by mouth daily for 8 days for pneumonia Prescriptions: LEVOFLOXACIN 750 MG TABS (LEVOFLOXACIN) 1 by mouth daily for 8 days for pneumonia  #8 x 0   Entered by:   Sarah Swaziland MD   Authorized by:   Demetria Pore MD   Signed by:   Sarah Swaziland MD on 07/10/2010   Method used:   Faxed to ...       [facility] (retail)       [address]       [place], [place]  [address]       Ph: [phone]       Fax: [phone]   RxID:   [id number]

## 2010-11-15 NOTE — Miscellaneous (Signed)
Summary: orders for [facility]  Clinical Lists Changes Amy Burchette, an RN with [facility] asked for verbal ok to continue the pill box fill & insulin syringe fills. the 485 will be sent but wants a verbal so there is no delay. they also are treating a friction shear area on her sacrum with hydrocolloid twice a week.  wants order to continue this. plz call her at [phone].[address]en Circle RN  Mar 01, 2010 10:53 AM  Called and left a message with verbal order. Khaliya Golinski Swaziland MD  Mar 02, 2010 1:48 PM

## 2010-11-15 NOTE — Assessment & Plan Note (Signed)
Summary: F/U per [name] / [facility]   Vital Signs:  Patient profile:   69 year old female Height:      66 inches  Vitals Entered By: Wyona Almas PHD (February 05, 2010 11:29 AM)  Primary Care Provider:  Sarah Swaziland MD   History of Present Illness: Assessment:  Spent 30 minutes with pt.  Difficult to get a kcal estimate from 24-hr food recall b/c Holly Kim was unsure of quantities, but said she ate very little of what her son served.  Amounts listed are estimated intake: B (9 AM)- 1 c cooked oatmeal, 1/2 c diet fruit cocktail, 1 c skim milk, 1 c coffee w/ flavoring; Snk- Crystal Light; L (12:30 PM)- 1 bite banana, 2 bites raisin bread, 1 c sk milk, 12 oz Crystal Light; Snk- Crystal Light; D (8:30 PM)- chx leg w/ skin, 2 bites mashed potatoes, 1 1/2 c broccoli, 1 tbsp pinto beans, 1 c sk milk, Crystal Light. Other lunches might be tomato or chx noodle soup / egg salad sandwich / salad / chx sandwich.  In the past 3 wk, FBG has averaged 175 (108-249), with 7 >200.  Annison still has significant abdominal pain, which is why her appetite is poor.    Nutrition Diagnosis:  No changes in physical inactivity (NB-2.1) related to disability as evidenced by wheelchair requirement.  Some progress on imbalance of nutrients (NI5.5) related to carbohydrates as evidenced by 7 FBG <150 in the past 3 wk (likely mostly related to reduced overall intake).    Intervention:  See Patient Instructions.   Monitoring/Eval: Dietary intake and exercise at 4-wk F/U.    Allergies: 1)  ! Amoxicillin 2)  ! Ibuprofen 3)  ! Haldol 4)  Naprosyn (Naproxen) 5)  Norvasc (Amlodipine Besylate)   Complete Medication List: 1)  Lantus 100 Unit/ml Soln (Insulin glargine) .... Inject 55 units subcutaneously once a day disp 3 months 2)  Relion R 100 Unit/ml Soln (Insulin regular human) .... Inject 18  units subcutaneously three times a day before meals.  dispense quantity sufficient for one month supply. 3)  Zocor 20 Mg Tabs  (Simvastatin) .... Take 1 tablet by mouth every night 4)  Senior Multivitamin Plus Tabs (Multiple vitamins-minerals) .Marland Kitchen.. 1 by mouth qd 5)  Aspirin Adult Low Strength 81 Mg Tbec (Aspirin) .Marland Kitchen.. 1 by mouth once daily 6)  Lorazepam 0.5 Mg Tabs (Lorazepam) .... One two times a day as needed for anxiety 7)  Ms Contin 15 Mg Xr12h-tab (Morphine sulfate) .... Take 1 by mouth two times a day for back pain. 8)  Torsemide 20 Mg Tabs (Torsemide) .Marland Kitchen.. 1 by mouth two times a day for fluid 9)  Percocet 10-325 Mg Tabs (Oxycodone-acetaminophen) .... One tab three times a day 10)  Lisinopril 5 Mg Tabs (Lisinopril) .... One daily 11)  Nystatin 100000 Unit/gm Powd (Nystatin) .... Apply to affected area bid 12)  Iron 325 (65 Fe) Mg Tabs (Ferrous sulfate) .... Take 1 by mouth daily for low iron 13)  Promethazine Hcl 25 Mg Tabs (Promethazine hcl) .... Take 1 by mouth every 6 hours as needed for nausea 14)  Senokot 8.6 Mg Tabs (Sennosides) .... 3 tablets by mouth daily for constipation 15)  Sorbitol 70 % Soln (Sorbitol) .... Take 2 tbsp every 2 hours until bm, then take 2 tbsp daily-qs 16)  Erythromycin Base 250 Mg Tabs (Erythromycin base) .... Take 1 by mouth 30 minutes before meals 17)  Pyridium 200 Mg Tabs (Phenazopyridine hcl) .... Take 1 by mouth  3 times a day for 2 days. 18)  Ciprofloxacin Hcl 250 Mg Tabs (Ciprofloxacin hcl) .Marland Kitchen.. 1 by mouth two times a day for urine infection. 19)  Prilosec 40 Mg Cpdr (Omeprazole) .Marland Kitchen.. 1 by mouth daily for stomach pain  Other Orders: Reassessment Each 15 min unit- [facility] (86578)  Patient Instructions: 1)  ASK YOUR AIDE TO CALL ME TO READ THE INGREDIENTS ON YOUR HAZELNUT COFFEE CREAMER:  [phone].   2)  KEEP DRINKING SKIM MILK AT [facility] MEAL, AND DO YOUR BEST TO GET BOTH A SOURCE OF PROTEIN AND SOME VEGETABLES AT LUNCH AND DINNER.   3)  PROTEIN SOURCES INCLUDE PINTO BEANS, DAIRY FOODS, EGGS, CHEESE, MEAT, CHICKEN, FISH.   4)  YOUR NEXT NUTRITION APPOINTMENT IS THURSDAY, MAY 26 AT  NOON.

## 2010-11-15 NOTE — Progress Notes (Signed)
Summary: RX for pain meds at front desk.  Phone Note Call from Patient   Caller: Patient Call For: [phone] Summary of Call: Pat need written rx for Percocet and Morphine.  Will have someone pick up today when ready.  Thought she still had enough left, but discovered after she got home from yesterday's appt she didn't.  Please call pat when rx is ready for pickup. Initial call taken by: Britta Mccreedy mcgregor  Follow-up for Phone Call        Spoke with Ms . Espejo.  She is not out of medicine, but was feeling anxious because the pharmacy did not have a prescription for next month's meds.  She has someone who can pick them up today.  Will write rx for do not fill until 07/07/10.   Follow-up by: Sarah Swaziland MD,  June 26, 2010 1:40 PM    New/Updated Medications: MS CONTIN 15 MG XR12H-TAB (MORPHINE SULFATE) Take 1 by mouth two times a day for back pain.  DO NOT FILL UNTIL 07/07/10 PERCOCET 10-325 MG TABS (OXYCODONE-ACETAMINOPHEN) one tab three times a day as needed pain.  DO NOT FILL UNTIL 07/07/10. Prescriptions: MS CONTIN 15 MG XR12H-TAB (MORPHINE SULFATE) Take 1 by mouth two times a day for back pain.  DO NOT FILL UNTIL 07/07/10  #60 x 0   Entered and Authorized by:   Sarah Swaziland MD   Signed by:   Sarah Swaziland MD on 06/26/2010   Method used:   Handwritten   RxID:   [id number] PERCOCET 10-325 MG TABS (OXYCODONE-ACETAMINOPHEN) one tab three times a day as needed pain.  DO NOT FILL UNTIL 07/07/10.  #90 x 0   Entered and Authorized by:   Sarah Swaziland MD   Signed by:   Sarah Swaziland MD on 06/26/2010   Method used:   Handwritten   RxID:   [id number]

## 2010-11-15 NOTE — Progress Notes (Signed)
Summary: phn msg  Phone Note Call from Patient Call back at Home Phone [phone]   Caller: Patient Summary of Call: side still hurting and Dr Swaziland was going to refer her to GI doc but hasn't heard anything yet. Initial call taken by: De Nurse,  January 02, 2010 10:20 AM  Follow-up for Phone Call        do not see order for GI,  To PCP Follow-up by: Gladstone Pih,  January 02, 2010 10:25 AM  Additional Follow-up for Phone Call Additional follow up Details #1::        Dr Swaziland put GI order in, sent to lynn to do referral Additional Follow-up by: Gladstone Pih,  January 03, 2010 12:04 PM

## 2010-11-15 NOTE — Assessment & Plan Note (Signed)
Summary: F/U/KH   Vital Signs:  Patient profile:   70 year old female Weight:      288 pounds Temp:     98 degrees F oral Pulse rate:   82 / minute Pulse rhythm:   regular BP sitting:   123 / 57  (left arm) Cuff size:   large  Vitals Entered By: Loralee Pacas CMA (March 28, 2010 11:21 AM)  Primary Care Provider:  Corbet Hanley [place] MD   History of Present Illness: Sugars "not good" Running over 200.  States it's not becasue of what she is eating.    Wants to stop outpatient rehab becasue they make her lie flat..  Wants the company other than [facility] because they can bring a machine.  Also had to pay to ride [facility] becsue they couldn't make her appts during the free times.  Hospital bed and air mattress are broken.  [facility] requires $75 each to fix them.  Pt wants me to call [facility] to see if they can do any thing else because she cannot afford this.  Has had trouble with concentrators with [facility].    Reports her pain is the same.  Taking 2 nausea pills a day.  Taking Reglan.  Not taking prilosec.    Has form to fill our for [employer].    Refused to let the man nurse look at her bedsore.    Gets out of the house daily to go to mailbox, goes to get bread or vegetables.    Allergies: 1)  ! Amoxicillin 2)  ! Ibuprofen 3)  ! Haldol 4)  Naprosyn (Naproxen) 5)  Norvasc (Amlodipine Besylate)  Review of Systems       see HPI  Physical Exam  General:  Wheelchair bound , obese,in no acute distress; alert,appropriate and cooperative throughout examination.  Vitals noted Lungs:  Normal respiratory effort, chest expands symmetrically. Lungs are clear to auscultation except for minimal rales at bases Heart:  Normal rate and regular rhythm. S1 and S2 normal without gallop, murmur, click, rub or other extra sounds. Abdomen:  Severely Limited by body habitus.  +BS.  TTP RUQ, but no mass appreciated.  No HSM appreciated, but body habitus makes it very difficult to rule out organomegaly.     Extremities:  No edema   Impression & Recommendations:  Problem # 1:  ABNORMAL CHEST XRAY (ICD-793.1)  CT reviewed and was negative.  Shadow seem previously correlates with adipose tissue.  Orders: [facility]- Est Level  2 (74259)  Problem # 2:  ABDOMINAL PAIN, GENERALIZED (ICD-789.07)  Unclear etiology.  Pt with gallstones, but poor surgical candidate.  Unclear if gallstones are source of pain as pt c/o constant pain, unrelieved by anything.  CT abdomen neg.  Continue to follow with GI.   Her updated medication list for this problem includes:    Metoclopramide Hcl 5 Mg Tabs (Metoclopramide hcl) .Marland Kitchen... 1 by mouth before meals and qhs  Orders: [facility]- Est Level  2 (56387)  Problem # 3:  SHORTNESS OF BREATH (ICD-786.05) O2 dependent.  Forms filled out for [employer].  Complete Medication List: 1)  Lantus 100 Unit/ml Soln (Insulin glargine) .... Inject 55 units subcutaneously once a day disp 3 months 2)  Relion R 100 Unit/ml Soln (Insulin regular human) .... Inject 18  units subcutaneously three times a day before meals.  dispense quantity sufficient for one month supply. 3)  Zocor 20 Mg Tabs (Simvastatin) .... Take 1 tablet by mouth every night 4)  Senior  Multivitamin Plus Tabs (Multiple vitamins-minerals) .Marland Kitchen.. 1 by mouth qd 5)  Aspirin Adult Low Strength 81 Mg Tbec (Aspirin) .Marland Kitchen.. 1 by mouth once daily 6)  Lorazepam 0.5 Mg Tabs (Lorazepam) .... One two times a day as needed for anxiety 7)  Ms Contin 15 Mg Xr12h-tab (Morphine sulfate) .... Take 1 by mouth two times a day for back pain. 8)  Torsemide 20 Mg Tabs (Torsemide) .Marland Kitchen.. 1 by mouth two times a day for fluid 9)  Percocet 10-325 Mg Tabs (Oxycodone-acetaminophen) .... One tab three times a day 10)  Lisinopril 5 Mg Tabs (Lisinopril) .... One daily 11)  Nystatin 100000 Unit/gm Powd (Nystatin) .... Apply to affected area bid 12)  Iron 325 (65 Fe) Mg Tabs (Ferrous sulfate) .... Take 1 by mouth daily for low iron 13)  Promethazine Hcl 25 Mg  Tabs (Promethazine hcl) .... Take 1 by mouth every 6 hours as needed for nausea 14)  Senokot 8.6 Mg Tabs (Sennosides) .... 3 tablets by mouth daily for constipation 15)  Sorbitol 70 % Soln (Sorbitol) .... Take 2 tbsp every 2 hours until bm, then take 2 tbsp daily-qs 16)  Erythromycin Base 250 Mg Tabs (Erythromycin base) .... Take 1 by mouth 30 minutes before meals 17)  Pyridium 200 Mg Tabs (Phenazopyridine hcl) .... Take 1 by mouth 3 times a day for 2 days. 18)  Ciprofloxacin Hcl 250 Mg Tabs (Ciprofloxacin hcl) .Marland Kitchen.. 1 by mouth two times a day for urine infection. 19)  Omeprazole 20 Mg Tbec (Omeprazole) .Marland Kitchen.. 1 by mouth two times a day for stomach pain. 20)  Metoclopramide Hcl 5 Mg Tabs (Metoclopramide hcl) .Marland Kitchen.. 1 by mouth before meals and qhs 21)  Fluconazole 150 Mg Tabs (Fluconazole) .Marland Kitchen.. 1 by mouth once 22)  Nystatin 100000 Unit/gm Powd (Nystatin) .... Apply to affected area bid 23)  Nystatin 100000 Unit/gm Crea (Nystatin) .... Apply to affected area bid Prescriptions: PERCOCET 10-325 MG TABS (OXYCODONE-ACETAMINOPHEN) one tab three times a day  #90 x 0   Entered and Authorized by:   Regana Kemple [place] MD   Signed by:   Maki Hege [place] MD on 03/28/2010   Method used:   Print then Give to Patient   RxID:   [id number] MS CONTIN 15 MG XR12H-TAB (MORPHINE SULFATE) Take 1 by mouth two times a day for back pain.  #60 x 0   Entered and Authorized by:   Ajna Moors [place] MD   Signed by:   Taijuan Serviss [place] MD on 03/28/2010   Method used:   Print then Give to Patient   RxID:   [id number]

## 2010-11-15 NOTE — Assessment & Plan Note (Signed)
Summary: f/u,df   Vital Signs:  Patient profile:   70 year old female Height:      66 inches Temp:     98.2 degrees F oral Pulse rate:   58 / minute BP sitting:   123 / 53  (left arm)  Vitals Entered By: Tessie Fass CMA (September 03, 2010 11:31 AM) CC: F/U Is Patient Diabetic? Yes Pain Assessment Patient in pain? yes     Location: abdomen Intensity: 10   Primary Care Provider:  Sarah [place] MD  CC:  F/U.  History of Present Illness: PT reports that she has had more pain in abdomen, RUQ for 4 days.  Worse after she ate a tomato sandwich. Thinks it was the acid.  Also had mayonaise and salt and pepper.  Started suddenly, feels like a knife is slicing through her back, constant.  Feels better if a cold rag is on it.  Still eating.  + nauseaus.  No vomiting.  Still constipated.  Pain meds make the pain better.  No dysuria.    still at baseline SOB.  Using O2 constantly.  No chest pain.    Pt still with bedsore.  States it comes and goes, and it's there now.  RN is to come tomorrow to address.  Thinks she has a place to move.  Still waiting to hear from them to see if she can move there.  Has to be out December 2nd/3rd.    Has knot on abdomen where the abdominal pain is.    Allergies: 1)  ! Amoxicillin 2)  ! Ibuprofen 3)  ! Haldol 4)  Naprosyn (Naproxen) 5)  Norvasc (Amlodipine Besylate)  Review of Systems       see hpi  Physical Exam  General:  Obese, wheelchair bound, uncomfortable but no acute distress; alert,appropriate and cooperative throughout examination Abdomen:  Exam limited by body habitus.  Very large pannus, but no mass or HSM appreciated.  Very tender RUQ with pos murphy's with minimal palpation.   Also with 0.5 cm bulla with surrounding erythema noted in area of RUQ.  Skin:  Stage 2 decubitus ulcer noted on buttocks.    L great toe hematoma stable with continued demarcated lesion.  No surrounding erythema.  No streaking.     Impression &  Recommendations:  Problem # 1:  ABDOMINAL PAIN, RIGHT UPPER QUADRANT (ICD-789.01) Unclear etiology.  Pt has had extensive w/u for abdominal pain this year, but pain was different.  We did stop her PPI due to interaction with plavix.  Can consider another choice for acid supression, but clinical picture not consistent with GERD.  Will try to check RUQ ultrasound again.  Exam likely limited due to pt's body habitus.  Pt poor candidite for surgery, but it an ultrasound can help [name] with a diagnosis.  Pt aware that in order to optimize study, ultrasound tech will need to press on abdomen with probe and she agrees to the test.  Will also check LFTs and LIpase today. Her updated medication list for this problem includes:    Metoclopramide Hcl 5 Mg Tabs (Metoclopramide hcl) .Marland Kitchen... 1 by mouth before meals and qhs  Orders: Comp Met-[facility] ([address]-60454) Lipase-[facility] (09811-91478) Ultrasound (Ultrasound) [facility]- Est Level  3 (29562)  Problem # 2:  DECUBITUS ULCER, BUTTOCK (ICD-707.05)  Stable.  Continue hydrocolloid dressing  Orders: [facility]- Est Level  3 (99213)  Problem # 3:  CONTUSION OF MULTIPLE SITES OF LOWER LIMB (ICD-924.4)  Stable hematoma.  Concerning for high risk  area given her peripheral neuropathy and poor circulation.  Wound clinic pending.    Orders: [facility]- Est Level  3 (99213)  Problem # 4:  OTHER SPECIFIED HOUSING/ECONOMIC CIRCUMSTANCES (ICD-V60.89) Pt has application in for new apt.   Orders: [facility]- Est Level  3 (16109)  Complete Medication List: 1)  Lantus 100 Unit/ml Soln (Insulin glargine) .... Inject 55 units subcutaneously once a day disp 3 months 2)  Relion R 100 Unit/ml Soln (Insulin regular human) .... Inject 18  units subcutaneously three times a day before meals.  dispense quantity sufficient for one month supply. 3)  Zocor 20 Mg Tabs (Simvastatin) .... Take 1 tablet by mouth every night 4)  Senior Multivitamin Plus Tabs (Multiple vitamins-minerals) .Marland Kitchen.. 1 by mouth qd 5)  Aspirin  Adult Low Strength 81 Mg Tbec (Aspirin) .Marland Kitchen.. 1 by mouth once daily 6)  Lorazepam 0.5 Mg Tabs (Lorazepam) .... One two times a day as needed for anxiety 7)  Ms Contin 15 Mg Xr12h-tab (Morphine sulfate) .... Take 1 by mouth two times a day for back pain.  do not fill until 09/05/10 8)  Torsemide 20 Mg Tabs (Torsemide) .Marland Kitchen.. 1 by mouth two times a day for fluid 9)  Percocet 10-325 Mg Tabs (Oxycodone-acetaminophen) .... One tab every 6 hours as needed pain.  do not fill until 09/05/10. 10)  Lisinopril 5 Mg Tabs (Lisinopril) .... One daily 11)  Nystatin 100000 Unit/gm Powd (Nystatin) .... Apply to affected area bid 12)  Iron 325 (65 Fe) Mg Tabs (Ferrous sulfate) .... Take 1 by mouth daily for low iron 13)  Promethazine Hcl 25 Mg Tabs (Promethazine hcl) .... Take 1 by mouth every 6 hours as needed for nausea 14)  Senokot 8.6 Mg Tabs (Sennosides) .... 3 tablets by mouth daily for constipation 15)  Sorbitol 70 % Soln (Sorbitol) .... Take 2 tbsp every 2 hours until bm, then take 2 tbsp daily-qs 16)  Erythromycin Base 250 Mg Tabs (Erythromycin base) .... Take 1 by mouth 30 minutes before meals 17)  Metoclopramide Hcl 5 Mg Tabs (Metoclopramide hcl) .Marland Kitchen.. 1 by mouth before meals and qhs 18)  Fluconazole 150 Mg Tabs (Fluconazole) .Marland Kitchen.. 1 by mouth once 19)  Nystatin 100000 Unit/gm Powd (Nystatin) .... Apply to affected area bid 20)  Nystatin 100000 Unit/gm Crea (Nystatin) .... Apply to affected area bid 21)  Carafate 1 Gm Tabs (Sucralfate) .Marland Kitchen.. 1 by mouth three times a day before meals 22)  Dexilant 60 Mg Cpdr (Dexlansoprazole) .Marland Kitchen.. 1 by mouth daily 23)  Plavix 75 Mg Tabs (Clopidogrel bisulfate) .Marland Kitchen.. 1 by mouth daily 24)  Crestor 40 Mg Tabs (Rosuvastatin calcium) .Marland Kitchen.. 1 by mouth q hs for cholesterol 25)  Ranitidine Hcl 150 Mg Caps (Ranitidine hcl) .Marland Kitchen.. 1 by mouth two times a day for stomach. 26)  Doxycycline Hyclate 100 Mg Caps (Doxycycline hyclate) .Marland Kitchen.. 1 by mouth two times a day for 14  days Prescriptions: DOXYCYCLINE HYCLATE 100 MG CAPS (DOXYCYCLINE HYCLATE) 1 by mouth two times a day for 14 days  #28 x 0   Entered and Authorized by:   Sarah [place] MD   Signed by:   Sarah [place] MD on 09/03/2010   Method used:   Print then Give to Patient   RxID:   [id number] PERCOCET 10-325 MG TABS (OXYCODONE-ACETAMINOPHEN) one tab every 6 hours as needed pain.  DO NOT FILL UNTIL 09/05/10.  #90 x 0   Entered and Authorized by:   Sarah [place] MD   Signed by:  Sarah [place] MD on 09/03/2010   Method used:   Print then Give to Patient   RxID:   [id number] MS CONTIN 15 MG XR12H-TAB (MORPHINE SULFATE) Take 1 by mouth two times a day for back pain.  DO NOT FILL UNTIL 09/05/10  #60 x 0   Entered and Authorized by:   Sarah [place] MD   Signed by:   Sarah [place] MD on 09/03/2010   Method used:   Print then Give to Patient   RxID:   [id number]    Orders Added: 1)  Comp Met-[facility] [[id number]] 2)  Lipase-[facility] [[id number]] 3)  Ultrasound [Ultrasound] 4)  [facility]- Est Level  3 [27253]

## 2010-11-15 NOTE — Consult Note (Signed)
Summary: [facility]  [facility]   Imported By: De Nurse 11/17/2009 17:05:29  _____________________________________________________________________  External Attachment:    Type:   Image     Comment:   External Document  Appended Document: [facility]- stable diabetic retinopathy Diabetic retinopathy - stable   Clinical Lists Changes

## 2010-11-15 NOTE — Miscellaneous (Signed)
Summary: diabetic shoes declined  Clinical Lists Changes rec'd request from [facility] services asking for approval for her shoes. to pcp.[place] RN  Feb 28, 2010 11:24 AM  t does not wear shoes.  Shoes not approved.  Sarah [place] MD  Feb 28, 2010 10:24 PM'

## 2010-11-15 NOTE — Miscellaneous (Signed)
Summary: crestor rx  Clinical Lists Changes  Medications: Added new medication of CRESTOR 40 MG TABS (ROSUVASTATIN CALCIUM) 1 by mouth q hs for cholesterol

## 2010-11-15 NOTE — Assessment & Plan Note (Signed)
Summary: Seeing Sykes @ noon / [facility]   Vital Signs:  Patient profile:   70 year old female Height:      66 inches Weight:      299. pounds BMI:     48.43  Vitals Entered By: Wyona Almas PHD (June 25, 2010 12:04 PM)  Primary Care Provider:  Sarah [place] MD   History of Present Illness: Assessment:  Spent 30 min with pt.  24-hr recall suggests intake of  ~1000 kcal: B (9:30 AM)- 1/2 scrmbled egg, 12 oz fat-free milk, coffee with Swt 'n Low; L (12 PM)- Jell-O, 2 small slices hamburger pizza, 12 oz fat-free milk; D (12 AM)- 1 oz steak, 4 grilled shrimp, Jell-O, 12 oz fat-free milk, diet Sprite.  Donice said she just has no appetite since before hospitalization.  In addition, her son is preparing dinner as late as midnight, since he is working on building a patio in the back yard.  Garnette said her abdominal and chest pain are gone, but she feels generally lousy, and she was very sleepy at today's appt.  Since getting home from the hospital, her FBG have been 160, 75, 118, 148, 88.  She feels hypoglycemic with BGs below 90.  Terrilynn said there is not a lot of food in the house.  Her son has bought fat-free milk, but there are currently no foods Jalexus can fix for herself other than Jell-O (if it's already made) and unsweetened apple sauce.    Nutrition Diagnosis:  No changes in physical inactivity (NB-2.1) related to disability as evidenced by wheelchair requirement.  Definite progress on imbalance of nutrients (NI5.5) related to carbohydrates as evidenced by no high CHO intake recently, and in fact, total energy consumption is low.    Intervention:  See Patient Instructions.   Monitoring/Eval: Dietary intake and exercise at F/U in 4 wk.     Allergies: 1)  ! Amoxicillin 2)  ! Ibuprofen 3)  ! Haldol 4)  Naprosyn (Naproxen) 5)  Norvasc (Amlodipine Besylate)   Complete Medication List: 1)  Lantus 100 Unit/ml Soln (Insulin glargine) .... Inject 55 units subcutaneously once a  day disp 3 months 2)  Relion R 100 Unit/ml Soln (Insulin regular human) .... Inject 18  units subcutaneously three times a day before meals.  dispense quantity sufficient for one month supply. 3)  Zocor 20 Mg Tabs (Simvastatin) .... Take 1 tablet by mouth every night 4)  Senior Multivitamin Plus Tabs (Multiple vitamins-minerals) .Marland Kitchen.. 1 by mouth qd 5)  Aspirin Adult Low Strength 81 Mg Tbec (Aspirin) .Marland Kitchen.. 1 by mouth once daily 6)  Lorazepam 0.5 Mg Tabs (Lorazepam) .... One two times a day as needed for anxiety 7)  Ms Contin 15 Mg Xr12h-tab (Morphine sulfate) .... Take 1 by mouth two times a day for back pain. 8)  Torsemide 20 Mg Tabs (Torsemide) .Marland Kitchen.. 1 by mouth two times a day for fluid 9)  Percocet 10-325 Mg Tabs (Oxycodone-acetaminophen) .... One tab three times a day 10)  Lisinopril 5 Mg Tabs (Lisinopril) .... One daily 11)  Nystatin 100000 Unit/gm Powd (Nystatin) .... Apply to affected area bid 12)  Iron 325 (65 Fe) Mg Tabs (Ferrous sulfate) .... Take 1 by mouth daily for low iron 13)  Promethazine Hcl 25 Mg Tabs (Promethazine hcl) .... Take 1 by mouth every 6 hours as needed for nausea 14)  Senokot 8.6 Mg Tabs (Sennosides) .... 3 tablets by mouth daily for constipation 15)  Sorbitol 70 % Soln (Sorbitol) .... Take  2 tbsp every 2 hours until bm, then take 2 tbsp daily-qs 16)  Erythromycin Base 250 Mg Tabs (Erythromycin base) .... Take 1 by mouth 30 minutes before meals 17)  Pyridium 200 Mg Tabs (Phenazopyridine hcl) .... Take 1 by mouth 3 times a day for 2 days. 18)  Ciprofloxacin Hcl 250 Mg Tabs (Ciprofloxacin hcl) .Marland Kitchen.. 1 by mouth two times a day for urine infection. 19)  Omeprazole 20 Mg Tbec (Omeprazole) .Marland Kitchen.. 1 by mouth two times a day for stomach pain. 20)  Metoclopramide Hcl 5 Mg Tabs (Metoclopramide hcl) .Marland Kitchen.. 1 by mouth before meals and qhs 21)  Fluconazole 150 Mg Tabs (Fluconazole) .Marland Kitchen.. 1 by mouth once 22)  Nystatin 100000 Unit/gm Powd (Nystatin) .... Apply to affected area bid 23)   Nystatin 100000 Unit/gm Crea (Nystatin) .... Apply to affected area bid 24)  Carafate 1 Gm Tabs (Sucralfate) .Marland Kitchen.. 1 by mouth three times a day before meals 25)  Dexilant 60 Mg Cpdr (Dexlansoprazole) .Marland Kitchen.. 1 by mouth daily  [facility]  Patient Instructions: 1)  You need to eat enough to keep your blood sugar stable: at least 3 meals and one snack per day.  Go no more than 5 hours between eating, for example, breakfast at 9 AM, lunch at noon, snack at 3 or 4 PM, and 7 or 8 PM.   2)  Lunch and dinner need to include at least one serving of vegetables (or fruit, if no vegetables available).   3)  Groceries to keep on hand: 4)  Cottage cheese, yogurt, fat-free milk, string cheese, fresh fruit, unsweetened apple sauce, fresh/frozen vegetables, salad greens. 5)  Having these foods on hand will allow you to make a small lunch or get a snack, as needed.   6)  Follow-up appt in 4 weeks.  Remember to schedule with Dr. [place] for 2 weeks from today.

## 2010-11-15 NOTE — Assessment & Plan Note (Signed)
Summary: F/U PER PT RQST/RH   Vital Signs:  Patient profile:   70 year old female Weight:      281 pounds Temp:     98.2 degrees F oral Pulse rate:   62 / minute BP sitting:   149 / 69  (left arm) Cuff size:   large  Vitals Entered By: Loralee Pacas CMA (October 25, 2010 1:45 PM) CC: follow-up visit Is Patient Diabetic? Yes Did you bring your meter with you today? No   Primary Care Provider:  Sweden Lesure [place] MD  CC:  follow-up visit.  History of Present Illness: Pt reports that her pain is much better, but that she has been taking 3 morphine tablets on most days (one at breakfast, one in evening, and one if she is really hurting in the middle of the night).  Is also taking 3 percocets daily.  States that [name] could not return with her extra Morphine tablets because of transportation and the clinic being closed, and then she just started taking them.  States she is out of morphine now.  Also wants more percocets. Still with pain, but much improved.   She missed her appt for the HIDA scan.    She is pleased with her glucose.  Had one of 37, drank orange juice and felt better.  She reports being symptomatic when she is low, drinks orange juice and is fine.  Still taking 16 or 17 units Humulin three times a day and 55 Lantus every night.  Other sugars range from 100-200s.  Does not want to change her regimen.   Waiting on an eviction notice.  Once officially evicted, Legal Aid can help.  Was told she would get an eviction notice by last Monday, but has not yet.  Having trouble because [name] has a record.  States that [place] is [place] and come take care of her in the evenings.  She refuses nursing home placement.  No other leads on apartments.  [name] still drinks alcohol, but she is ok with that.  She reports he does not use any other substances.  Pt reports her sore on her buttocks is getting worse.  Rn Changes duoderm on buttocks.  + pain in area.  Habits &  Providers  Alcohol-Tobacco-Diet     Alcohol drinks/day: 0     Tobacco Status: never     Tobacco Counseling: not indicated; no tobacco use     Diet Counseling: to improve diet; diet is suboptimal  Allergies: 1)  ! Amoxicillin 2)  ! Ibuprofen 3)  ! Haldol 4)  Naprosyn (Naproxen) 5)  Norvasc (Amlodipine Besylate)  Review of Systems       No SOB or chest pain.  Still nausea.  No vomiting.  +constipation.  (Daily BM, but hard balls).    Physical Exam  General:  Wheel chair bound.  Obese.  No distress.  O2 in place. Vitals noted.   Abdomen:  Soft.  NT.  No mass or HSM appreciated, but exam limited by body habitus. + ecchymoses consistent with insulin injections Extremities:  L Great toe with scab in place.  DPs not appreciated B. Trace edema B Neurologic:  +Tardive dyskenesia Skin:  Pt wtih stage 2 decubitus ulcers on both buttocks.  Duoderm applied today. Psych:  Appears happier today than in the past.  Cheerful affect.  Nonlabile.  Still insists on getting percocets.  No FOI or LOA.  Normal thought content and process.   Impression &  Recommendations:  Problem # 1:  ABDOMINAL PAIN, GENERALIZED (ICD-789.07) Improved, but her pain meds are not making sense.  Reviewed long acting vs. Short acting meds.  If pt is truly taking 3 morphine tabs a day, she should be out tomorrow. Rx given for more, but at 15 mg dose.  Pt should also have 34 percocets left.  Will give rx for 60 percocets, which should last until the beginning of February.  She missed her appt for her HIDA scan, but with improvement in symptoms, I do not feel we need to persue at this time. I am not comfortable with pt's pain medicines.  She has been escalating her doses, and I feel this sudden escalation is concerning.  At a minimum, the high doses of narcotics are contributing to her constipation.  I feel there is a high risk for diversion of meds, but pt promises this is not happening.  I feel her pain management is beyond my  scope of practice, so I am referring Ms. Tecson to a pain center for advice about her regiemen. Her updated medication list for this problem includes:    Metoclopramide Hcl 5 Mg Tabs (Metoclopramide hcl) .Marland Kitchen... 1 by mouth before meals and qhs  Problem # 2:  DECUBITUS ULCER, BUTTOCK (ICD-707.05)  Duoderm applied today.  Follow up with home health.  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  Pt really wanted to stay on current regimen.  Feels she can manage symptoms fine.  Will reassess in 2 weeks when she follows up. Her updated medication list for this problem includes:    Lantus 100 Unit/ml Soln (Insulin glargine) ..... Inject 55 units subcutaneously once a day disp 3 months    Relion R 100 Unit/ml Soln (Insulin regular human) ..... Inject 18  units subcutaneously three times a day before meals.  dispense quantity sufficient for one month supply.    Aspirin Adult Low Strength 81 Mg Tbec (Aspirin) .Marland Kitchen... 1 by mouth once daily    Lisinopril 5 Mg Tabs (Lisinopril) ..... One daily  Orders: A1C-[facility] (95638) [facility]- Est Level  3 (75643)  Complete Medication List: 1)  Lantus 100 Unit/ml Soln (Insulin glargine) .... Inject 55 units subcutaneously once a day disp 3 months 2)  Relion R 100 Unit/ml Soln (Insulin regular human) .... Inject 18  units subcutaneously three times a day before meals.  dispense quantity sufficient for one month supply. 3)  Zocor 20 Mg Tabs (Simvastatin) .... Take 1 tablet by mouth every night 4)  Senior Multivitamin Plus Tabs (Multiple vitamins-minerals) .Marland Kitchen.. 1 by mouth qd 5)  Aspirin Adult Low Strength 81 Mg Tbec (Aspirin) .Marland Kitchen.. 1 by mouth once daily 6)  Lorazepam 0.5 Mg Tabs (Lorazepam) .... One two times a day as needed for anxiety 7)  Torsemide 20 Mg Tabs (Torsemide) .Marland Kitchen.. 1 by mouth two times a day for fluid 8)  Lisinopril 5 Mg Tabs (Lisinopril) .... One daily 9)  Nystatin 100000  Unit/gm Powd (Nystatin) .... Apply to affected area bid 10)  Iron 325 (65 Fe) Mg Tabs (Ferrous sulfate) .... Take 1 by mouth daily for low iron 11)  Promethazine Hcl 25 Mg Tabs (Promethazine hcl) .... Take 1 by mouth every 6 hours as needed for nausea 12)  Senokot 8.6 Mg Tabs (Sennosides) .... 3 tablets by mouth daily for constipation 13)  Sorbitol 70 % Soln (Sorbitol) .... Take 2 tbsp every 2 hours until bm,  then take 2 tbsp daily-qs 14)  Erythromycin Base 250 Mg Tabs (Erythromycin base) .... Take 1 by mouth 30 minutes before meals 15)  Metoclopramide Hcl 5 Mg Tabs (Metoclopramide hcl) .Marland Kitchen.. 1 by mouth before meals and qhs 16)  Fluconazole 150 Mg Tabs (Fluconazole) .Marland Kitchen.. 1 by mouth once 17)  Nystatin 100000 Unit/gm Powd (Nystatin) .... Apply to affected area bid 18)  Nystatin 100000 Unit/gm Crea (Nystatin) .... Apply to affected area bid 19)  Carafate 1 Gm Tabs (Sucralfate) .Marland Kitchen.. 1 by mouth three times a day before meals 20)  Dexilant 60 Mg Cpdr (Dexlansoprazole) .Marland Kitchen.. 1 by mouth daily 21)  Plavix 75 Mg Tabs (Clopidogrel bisulfate) .Marland Kitchen.. 1 by mouth daily 22)  Crestor 40 Mg Tabs (Rosuvastatin calcium) .Marland Kitchen.. 1 by mouth q hs for cholesterol 23)  Ranitidine Hcl 150 Mg Caps (Ranitidine hcl) .Marland Kitchen.. 1 by mouth two times a day for stomach. 24)  Ms Contin 30 Mg Xr12h-tab (Morphine sulfate) .... Take 1 tablet twice daily.  start these after you run out of the other morphine tablets. 25)  Metoprolol Tartrate 25 Mg Tabs (Metoprolol tartrate) .Marland Kitchen.. 1 by mouth two times a day 26)  Percocet 10-325 Mg Tabs (Oxycodone-acetaminophen) .... Take up to every 6 hours for severe pain. this should last you for 30 days with the other percocets that you still have at home. 27)  Ms Contin 15 Mg Xr12h-tab (Morphine sulfate) .... Take 1 tablet twice a day.  Other Orders: Hemoglobin-[facility] (16109) Pain Clinic Referral (Pain)  Patient Instructions: 1)  You need to stop taking so much Morphine.  We will put you back on the 15 mg  tablets and have you take them twice a day.  I will also write a prescription for the percocet.  You may take one of those up to three times a day. I will write the prescription today for 60 tablets of Percocet because you should still have 34 tablets at home.  Today's prescription should last until February 1st. 2)  We will refer you to the pain management clinic. 3)  Good job with your sugars! Prescriptions: PERCOCET 10-325 MG TABS (OXYCODONE-ACETAMINOPHEN) Take up to every 6 hours for severe pain. This should last you for 30 days with the other percocets that you still have at home.  #60 x 0   Entered and Authorized by:   Jyra Lagares [place] MD   Signed by:   Kaden Dunkel [place] MD on 10/25/2010   Method used:   Print then Give to Patient   RxID:   [id number] MS CONTIN 15 MG XR12H-TAB (MORPHINE SULFATE) Take 1 tablet twice a day.  #60 x 0   Entered and Authorized by:   Jens Siems [place] MD   Signed by:   Lexy Meininger [place] MD on 10/25/2010   Method used:   Print then Give to Patient   RxID:   [id number]    Orders Added: 1)  A1C-[facility] [83036] 2)  Hemoglobin-[facility] [85018] 3)  Pain Clinic Referral [Pain] 4)  [facility]- Est Level  3 [99213]    Laboratory Results   Blood Tests   Date/Time Received: October 25, 2010 1:46 PM  Date/Time Reported: October 25, 2010 2:26 PM   HGBA1C: 5.6%   (Normal Range: Non-Diabetic - 3-6%   Control Diabetic - 6-8%)   CBC   HGB:  8.4 g/dL   (Normal Range: 29.5-28.4 in Males, 12.0-15.0 in Females) Comments: capillary sample ...............test performed by......Marland KitchenBonnie A. [place], MLS (ASCP)cm

## 2010-11-15 NOTE — Assessment & Plan Note (Signed)
Summary: pain in side/[place]   Vital Signs:  Patient profile:   70 year old female Height:      66 inches O2 Sat:      99 % on 2 L/min Temp:     98.3 degrees F oral Pulse rate:   111 / minute BP sitting:   141 / 81  (left arm) Cuff size:   large  Vitals Entered By: Tessie Fass CMA (December 05, 2009 11:08 AM)  O2 Flow:  2 L/min CC: right side pain Is Patient Diabetic? Yes Pain Assessment Patient in pain? yes     Location: right side Intensity: 10   Primary Care Provider:  Sarah [place] MD  CC:  right side pain.  History of Present Illness: patient c/o constipation, last BM two days ago, small amount hard stool.  Reports frequent episode of sitting on toilet but unable to pass stool, reports abdominal fullness with intermittent abdominal cramping.  Also c/o nausea and decreased by mouth intake.  Currently drinks plenty of fluids, takes Senokot and prune juice for constipation prevention, on narcotic for pain control.  \par  Also c/o of continued skin irritation in skinfolds for which she has been using nystatin cream.  Feels she needs additional medication since there is some improvement but not yet healed.  Habits & Providers  Alcohol-Tobacco-Diet     Tobacco Status: never  Current Medications (verified): 1)  Lantus 100 Unit/ml Soln (Insulin Glargine) .... Inject 55 Units Subcutaneously Once A Day Disp 3 Months 2)  Prilosec Otc 20 Mg Tbec (Omeprazole Magnesium) .... Take 1 Tablet By Mouth Every Morning 3)  Relion R 100 Unit/ml Soln (Insulin Regular Human) .... Inject 17 Units Subcutaneously Three Times A Day Before Meals 4)  Zocor 20 Mg Tabs (Simvastatin) .... Take 1 Tablet By Mouth Every Night 5)  Senior Multivitamin Plus  Tabs (Multiple Vitamins-Minerals) .Marland Kitchen.. 1 By Mouth Qd 6)  Aspirin Adult Low Strength 81 Mg Tbec (Aspirin) .Marland Kitchen.. 1 By Mouth Once Daily 7)  Lorazepam 0.5 Mg Tabs (Lorazepam) .... One Two Times A Day As Needed For Anxiety 8)  Ms Contin 15 Mg Xr12h-Tab  (Morphine Sulfate) .... Take 1 By Mouth Two Times A Day For Back Pain. 9)  Torsemide 20 Mg Tabs (Torsemide) .Marland Kitchen.. 1 By Mouth Two Times A Day For Fluid 10)  Percocet 10-325 Mg Tabs (Oxycodone-Acetaminophen) .... One Tab Three Times A Day 11)  Lisinopril 5 Mg Tabs (Lisinopril) .... One Daily 12)  Nystatin 100000 Unit/gm Powd (Nystatin) .... Apply To Affected Area Bid 13)  Iron 325 (65 Fe) Mg Tabs (Ferrous Sulfate) .... Take 1 By Mouth Daily For Low Iron 14)  Fluconazole 150 Mg Tabs (Fluconazole) .... One Tab Every Other Day Until Complete. 15)  Promethazine Hcl 25 Mg Tabs (Promethazine Hcl) .... Take 1 By Mouth Every 6 Hours As Needed For Nausea 16)  Senokot 8.6 Mg Tabs (Sennosides) .... 3 Tablets By Mouth Daily For Constipation 17)  Sorbitol 70 % Soln (Sorbitol) .... Take 2 Tbsp Every 2 Hours Until Bm, Then Take 2 Tbsp Daily-Qs 18)  Nystatin 100000 Unit/gm Powd (Nystatin) .... Apply Liberally To Affected Areas Two Times A Day Until Healed  Allergies (verified): 1)  ! Amoxicillin 2)  ! Ibuprofen 3)  ! Haldol 4)  Naprosyn (Naproxen) 5)  Norvasc (Amlodipine Besylate)  Review of Systems General:  Denies chills, fatigue, fever, malaise, and sweats. CV:  Denies chest pain or discomfort, lightheadness, and palpitations. Resp:  Complains of shortness of breath; denies  chest discomfort, cough, and sputum productive. GI:  Complains of abdominal pain, change in bowel habits, constipation, diarrhea, gas, loss of appetite, and nausea; denies bloody stools, dark tarry stools, indigestion, and vomiting. GU:  Denies dysuria, incontinence, and urinary frequency.  Physical Exam  General:  Overweight appearing, in no acute distress; alert,appropriate and cooperative throughout examination Mouth:  Mucus membranes pink and moist.   Lungs:  Normal respiratory effort, chest expands symmetrically. Lungs are clear to auscultation except left base rub, no crackles or wheezes. Heart:  Normal rate and regular  rhythm. S1 and S2 normal without gallop, murmur, click, rub or other extra sounds. Abdomen:  Bowel sounds positive,abdomen soft with generalized tenderness Skin:  Intact, mild irritation and erythema to intertrigunous folds, yellow cream substance noted, no swelling or inflammation Psych:  Cognition and judgment appear intact. Alert and cooperative with normal attention span and concentration. No apparent delusions, illusions, hallucinations   Impression & Recommendations:  Problem # 1:  CONSTIPATION (ICD-564.00)  Will add Sorbitol for additional constipation prevention.  Patient counseled on appropriate diet and nutrition. Her updated medication list for this problem includes:    Senokot 8.6 Mg Tabs (Sennosides) .Marland KitchenMarland KitchenMarland KitchenMarland Kitchen 3 tablets by mouth daily for constipation    Sorbitol 70 % Soln (Sorbitol) .Marland Kitchen... Take 2 tbsp every 2 hours until bm, then take 2 tbsp daily-qs  Orders: [facility]- Est  Level 4 (16109)  Problem # 2:  FUNGAL DERMATITIS (ICD-111.9) Refill of medication to be applied to intertrigunous areas until healed. Her updated medication list for this problem includes:    Nystatin 100000 Unit/gm Powd (Nystatin) .Marland Kitchen... Apply to affected area bid    Fluconazole 150 Mg Tabs (Fluconazole) ..... One tab every other day until complete.    Nystatin 100000 Unit/gm Powd (Nystatin) .Marland Kitchen... Apply liberally to affected areas two times a day until healed  Problem # 3:  SHORTNESS OF BREATH (ICD-786.05) Wearing O2, sat at 100%, Abdominal Xray reviewed and does have small left plural effusion, unsure of significance. Orders: Pulse Oximetry- [facility] (94760) [facility]- Est  Level 4 (99214)  Complete Medication List: 1)  Lantus 100 Unit/ml Soln (Insulin glargine) .... Inject 55 units subcutaneously once a day disp 3 months 2)  Prilosec Otc 20 Mg Tbec (Omeprazole magnesium) .... Take 1 tablet by mouth every morning 3)  Relion R 100 Unit/ml Soln (Insulin regular human) .... Inject 17 units subcutaneously three times a day  before meals 4)  Zocor 20 Mg Tabs (Simvastatin) .... Take 1 tablet by mouth every night 5)  Senior Multivitamin Plus Tabs (Multiple vitamins-minerals) .Marland Kitchen.. 1 by mouth qd 6)  Aspirin Adult Low Strength 81 Mg Tbec (Aspirin) .Marland Kitchen.. 1 by mouth once daily 7)  Lorazepam 0.5 Mg Tabs (Lorazepam) .... One two times a day as needed for anxiety 8)  Ms Contin 15 Mg Xr12h-tab (Morphine sulfate) .... Take 1 by mouth two times a day for back pain. 9)  Torsemide 20 Mg Tabs (Torsemide) .Marland Kitchen.. 1 by mouth two times a day for fluid 10)  Percocet 10-325 Mg Tabs (Oxycodone-acetaminophen) .... One tab three times a day 11)  Lisinopril 5 Mg Tabs (Lisinopril) .... One daily 12)  Nystatin 100000 Unit/gm Powd (Nystatin) .... Apply to affected area bid 13)  Iron 325 (65 Fe) Mg Tabs (Ferrous sulfate) .... Take 1 by mouth daily for low iron 14)  Fluconazole 150 Mg Tabs (Fluconazole) .... One tab every other day until complete. 15)  Promethazine Hcl 25 Mg Tabs (Promethazine hcl) .... Take 1 by mouth  every 6 hours as needed for nausea 16)  Senokot 8.6 Mg Tabs (Sennosides) .... 3 tablets by mouth daily for constipation 17)  Sorbitol 70 % Soln (Sorbitol) .... Take 2 tbsp every 2 hours until bm, then take 2 tbsp daily-qs 18)  Nystatin 100000 Unit/gm Powd (Nystatin) .... Apply liberally to affected areas two times a day until healed  Patient Instructions: 1)  Lactulose as directed Prescriptions: NYSTATIN 100000 UNIT/GM POWD (NYSTATIN) apply liberally to affected areas two times a day until healed  #1 x 2   Entered and Authorized by:   Luretha Murphy NP   Signed by:   Luretha Murphy NP on 12/05/2009   Method used:   Print then Give to Patient   RxID:   [id number] SORBITOL 70 % SOLN (SORBITOL) take 2 tbsp every 2 hours until BM, then take 2 tbsp daily-QS  #1 x 3   Entered and Authorized by:   Luretha Murphy NP   Signed by:   Luretha Murphy NP on 12/05/2009   Method used:   Print then Give to Patient   RxID:    [id number] POLYETHYLENE GLYCOL 3350  POWD (POLYETHYLENE GLYCOL 3350) one capful in 4 oz of liquid 4 times today, then two times a day tomorrow then daily., QS Brand medically necessary #1 x 6   Entered and Authorized by:   Luretha Murphy NP   Signed by:   Luretha Murphy NP on 12/05/2009   Method used:   Print then Give to Patient   RxID:   [id number]

## 2010-11-15 NOTE — Assessment & Plan Note (Signed)
Summary: Holly Kim   Vital Signs:  Patient profile:   70 year old female Temp:     98.3 degrees F Pulse rate:   92 / minute BP sitting:   112 / 68 Cuff size:   large   Primary Care Provider:  Legend Pecore [place] MD   History of Present Illness: Pt sitll with side pain.  Has BM daily but feels constipated.  Maybe blood in stool once per aid - pt has not nticed.  Still with nausea.  No improvement with erythrmycin.  Glucose 120s to 200 fasting.  51 - 300s non fasting.  Pt knows what to do if hypoglycemic.    Pt reports having colonoscopy and EGD, but unsure when.    Requests refills of pain meds for back pain.  Allergies: 1)  ! Amoxicillin 2)  ! Ibuprofen 3)  ! Haldol 4)  Naprosyn (Naproxen) 5)  Norvasc (Amlodipine Besylate)  Review of Systems       see HPI  Physical Exam  General:  Obese.  In wheelchair.  Exam limted by body habitus and pt's inability to safely transfer to table.  Vitals noted. Abdomen:  + BS, Soft.  TTP RUQ, RLQ.  No rebound, guarding.  Exam severely limited by body habitus, moblity limitations   Impression & Recommendations:  Problem # 1:  ABDOMINAL PAIN, GENERALIZED (ICD-789.07)  Plain films and CT neg.  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  No relief with EES.  Unable to eval for weight loss as we are unable to safely weigh pt in clinic.  Will review last colonoscopy and refer to GI if incidated to consder repeat scope.    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  Pt reports compliance with meds.   Her updated medication list for this problem includes:    Lantus 100 Unit/ml Soln (Insulin glargine) ..... Inject 55 units subcutaneously once a day disp 3 months    Relion R 100 Unit/ml Soln (Insulin regular human) ..... Inject 18  units subcutaneously three times a day before meals.  dispense quantity  sufficient for one month supply.    Aspirin Adult Low Strength 81 Mg Tbec (Aspirin) .Marland Kitchen... 1 by mouth once daily    Lisinopril 5 Mg Tabs (Lisinopril) ..... One daily  Orders: [facility]- Est Level  3 (60454)  Complete Medication List: 1)  Lantus 100 Unit/ml Soln (Insulin glargine) .... Inject 55 units subcutaneously once a day disp 3 months 2)  Prilosec Otc 20 Mg Tbec (Omeprazole magnesium) .... Take 1 tablet by mouth every morning 3)  Relion R 100 Unit/ml Soln (Insulin regular human) .... Inject 18  units subcutaneously three times a day before meals.  dispense quantity sufficient for one month supply. 4)  Zocor 20 Mg Tabs (Simvastatin) .... Take 1 tablet by mouth every night 5)  Senior Multivitamin Plus Tabs (Multiple vitamins-minerals) .Marland Kitchen.. 1 by mouth qd 6)  Aspirin Adult Low Strength 81 Mg Tbec (Aspirin) .Marland Kitchen.. 1 by mouth once daily 7)  Lorazepam 0.5 Mg Tabs (Lorazepam) .... One two times a day as needed for anxiety 8)  Ms Contin 15 Mg Xr12h-tab (Morphine sulfate) .... Take 1 by mouth two times a day for back pain. 9)  Torsemide 20 Mg Tabs (Torsemide) .Marland Kitchen.. 1 by mouth two times a day for fluid 10)  Percocet 10-325 Mg  Tabs (Oxycodone-acetaminophen) .... One tab three times a day 11)  Lisinopril 5 Mg Tabs (Lisinopril) .... One daily 12)  Nystatin 100000 Unit/gm Powd (Nystatin) .... Apply to affected area bid 13)  Iron 325 (65 Fe) Mg Tabs (Ferrous sulfate) .... Take 1 by mouth daily for low iron 14)  Promethazine Hcl 25 Mg Tabs (Promethazine hcl) .... Take 1 by mouth every 6 hours as needed for nausea 15)  Senokot 8.6 Mg Tabs (Sennosides) .... 3 tablets by mouth daily for constipation 16)  Sorbitol 70 % Soln (Sorbitol) .... Take 2 tbsp every 2 hours until bm, then take 2 tbsp daily-qs 17)  Erythromycin Base 250 Mg Tabs (Erythromycin base) .... Take 1 by mouth 30 minutes before meals 18)  Pyridium 200 Mg Tabs (Phenazopyridine hcl) .... Take 1 by mouth 3 times a day for 2 days. 19)  Ciprofloxacin Hcl  250 Mg Tabs (Ciprofloxacin hcl) .Marland Kitchen.. 1 by mouth two times a day for urine infection. Prescriptions: PERCOCET 10-325 MG TABS (OXYCODONE-ACETAMINOPHEN) one tab three times a day Brand medically necessary #90 x 0   Entered and Authorized by:   Zuri Lascala [place] MD   Signed by:   Lilliahna Schubring [place] MD on 12/28/2009   Method used:   Print then Give to Patient   RxID:   [id number] MS CONTIN 15 MG XR12H-TAB (MORPHINE SULFATE) Take 1 by mouth two times a day for back pain. Brand medically necessary #60 x 0   Entered and Authorized by:   Habeeb Puertas [place] MD   Signed by:   Joelyn Lover [place] MD on 12/28/2009   Method used:   Print then Give to Patient   RxID:   [id number]

## 2010-11-15 NOTE — Progress Notes (Signed)
Summary: nausea  Phone Note Call from Patient Call back at Home Phone [phone]   Caller: Patient Summary of Call: Pt needs her nausea medicine called into [facility].  She does not know the name of it. Initial call taken by: Clydell Hakim,  November 21, 2009 9:23 AM  Follow-up for Phone Call        states she is " so sick" asked that her anti nausea med be called in asap. to pcp Follow-up by: Golden Circle RN,  November 21, 2009 10:52 AM  Additional Follow-up for Phone Call Additional follow up Details #1::        I called in ondansetron (different from her metoclopramide, which she had taken in past) because side effects profile is better for her.  If she is having persistent vomiting, or unable to keep down any fluids, she could contact us.  Otherwise, I will plan to see her Thursday in clinic. Additional Follow-up by: Xylia Scherger [place] MD,  November 21, 2009 1:45 PM    Additional Follow-up for Phone Call Additional follow up Details #2::    told pt it had been called in Follow-up by: Golden Circle RN,  November 21, 2009 4:06 PM  New/Updated Medications: ONDANSETRON HCL 8 MG TABS (ONDANSETRON HCL) Take every 8 hours as needed for nausea. Prescriptions: ONDANSETRON HCL 8 MG TABS (ONDANSETRON HCL) Take every 8 hours as needed for nausea.  #15 x 0   Entered and Authorized by:   Jennise Both [place] MD   Signed by:   Kamela Blansett [place] MD on 11/21/2009   Method used:   Faxed to ...       [facility] (retail)       [address]       [place], [place]  [address]       Ph: [phone]       Fax: [phone]   RxID:   [id number]

## 2010-11-15 NOTE — Progress Notes (Signed)
Summary: refill  Phone Note Refill Request Call back at Home Phone [phone] Message from:  Patient  Refills Requested: Medication #1:  PRILOSEC 40 MG CPDR 1 by mouth Daily for stomach pain.   Notes: needs 2 per day per Dr Madilyn Fireman insurance will not pay for 40mg  - needs 20mg  2xdaily  Initial call taken by: De Nurse,  February 06, 2010 9:13 AM  Follow-up for Phone Call        Done Follow-up by: Beyza Bellino [place] MD,  February 06, 2010 9:38 AM    New/Updated Medications: OMEPRAZOLE 20 MG TBEC (OMEPRAZOLE) 1 by mouth two times a day for stomach pain. Prescriptions: OMEPRAZOLE 20 MG TBEC (OMEPRAZOLE) 1 by mouth two times a day for stomach pain.  #120 x 3   Entered and Authorized by:   Haidyn Kilburg [place] MD   Signed by:   Josehua Hammar [place] MD on 02/06/2010   Method used:   Faxed to ...       [facility] (retail)       [address]       [place], [place]  [address]       Ph: [phone]       Fax: [phone]   RxID:   [id number]

## 2010-11-15 NOTE — Progress Notes (Signed)
Summary: meds prob  Phone Note Call from Patient Call back at Home Phone [phone]   Caller: Patient Summary of Call: wants to talk to Kennon Rounds about meds - ins won't pay for nausea meds - needs phenagran Initial call taken by: De Nurse,  November 22, 2009 9:29 AM  Follow-up for Phone Call        to pcp Follow-up by: Golden Circle RN,  November 22, 2009 9:41 AM    New/Updated Medications: PROMETHAZINE HCL 25 MG TABS (PROMETHAZINE HCL) Take 1 by mouth every 6 hours as needed for nausea Prescriptions: PROMETHAZINE HCL 25 MG TABS (PROMETHAZINE HCL) Take 1 by mouth every 6 hours as needed for nausea  #20 x 0   Entered and Authorized by:   Sarah [place] MD   Signed by:   Sarah [place] MD on 11/22/2009   Method used:   Faxed to ...       [facility] (retail)       [address]       [place], [place]  [address]       Ph: [phone]       Fax: [phone]   RxID:   [id number]

## 2010-11-15 NOTE — Assessment & Plan Note (Signed)
Summary: Abdominal Pain  / Diabetes - PK   Vital Signs:  Patient profile:   70 year old female O2 Sat:      97 % on 2 L/min Pulse rate:   92 / minute BP sitting:   117 / 72  (left arm) Cuff size:   large  Vitals Entered By: Arlyss Repress CMA, (December 12, 2009 10:41 AM)  O2 Flow:  2 L/min  Primary Care Provider:  Zymeir Salminen [place] MD   History of Present Illness: patient arrives today with complaint of continued right sided abdominal pain.  Pain is constant.  Using her laxatives, having regular BM but still has feeling of constipation and some diarrhea. No rectal bleeding.  Unable to say what relieves pain.  + nausea, taking promethazine daily, sometimes in evenings as well.  No vomiting.  No fevers or chills.    she states she is NOT taking sorbitol as she has been having BMs consistently.  She continues to take senna.    Her blood sugars are improved but appear to be running slightly higher.  He only has one low blood sugar of 59 during the last month.        Current Medications (verified): 1)  Lantus 100 Unit/ml Soln (Insulin Glargine) .... Inject 55 Units Subcutaneously Once A Day Disp 3 Months 2)  Prilosec Otc 20 Mg Tbec (Omeprazole Magnesium) .... Take 1 Tablet By Mouth Every Morning 3)  Relion R 100 Unit/ml Soln (Insulin Regular Human) .... Inject 18  Units Subcutaneously Three Times A Day Before Meals.  Dispense Quantity Sufficient For One Month Supply. 4)  Zocor 20 Mg Tabs (Simvastatin) .... Take 1 Tablet By Mouth Every Night 5)  Senior Multivitamin Plus  Tabs (Multiple Vitamins-Minerals) .Marland Kitchen.. 1 By Mouth Qd 6)  Aspirin Adult Low Strength 81 Mg Tbec (Aspirin) .Marland Kitchen.. 1 By Mouth Once Daily 7)  Lorazepam 0.5 Mg Tabs (Lorazepam) .... One Two Times A Day As Needed For Anxiety 8)  Ms Contin 15 Mg Xr12h-Tab (Morphine Sulfate) .... Take 1 By Mouth Two Times A Day For Back Pain. 9)  Torsemide 20 Mg Tabs (Torsemide) .Marland Kitchen.. 1 By Mouth Two Times A Day For Fluid 10)  Percocet 10-325 Mg Tabs  (Oxycodone-Acetaminophen) .... One Tab Three Times A Day 11)  Lisinopril 5 Mg Tabs (Lisinopril) .... One Daily 12)  Nystatin 100000 Unit/gm Powd (Nystatin) .... Apply To Affected Area Bid 13)  Iron 325 (65 Fe) Mg Tabs (Ferrous Sulfate) .... Take 1 By Mouth Daily For Low Iron 14)  Promethazine Hcl 25 Mg Tabs (Promethazine Hcl) .... Take 1 By Mouth Every 6 Hours As Needed For Nausea 15)  Senokot 8.6 Mg Tabs (Sennosides) .... 3 Tablets By Mouth Daily For Constipation 16)  Sorbitol 70 % Soln (Sorbitol) .... Take 2 Tbsp Every 2 Hours Until Bm, Then Take 2 Tbsp Daily-Qs  Allergies (verified): 1)  ! Amoxicillin 2)  ! Ibuprofen 3)  ! Haldol 4)  Naprosyn (Naproxen) 5)  Norvasc (Amlodipine Besylate)  Review of Systems       see HPI  Physical Exam  General:  Obese.  Wheelchair bound.  No acute distress, but slightly tearful when discussing pain. Abdomen:  Very difficult to examine to due body habitus.  Unable to safely transfer pt to exam table for exam. TTP R side with some voluntary guarding.  No rebound.  No mass appreciated, but again, exam is limited.    Impression & Recommendations:  Problem # 1:  ABDOMINAL PAIN, GENERALIZED (  ICD-789.07)  Pt's abdominal films did not reveal source of pain.  She is continuing to have pain, so further eval is warranted.  Will check a CT of abdomen.  Creatine reviewed and is normal.  Pt without allergies to dye.  Will order CT with and without contrast to eval.    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   The lower dose of lantus and higher dose of regular appears to have improved erratic and low blood sugars.  Increase regular from 17 to 18 units prior to each meal (three times a day).   TTFFC:  25 minutes.    Seen today with Karolee Stamps, PharmD Candidate.  Her updated  medication list for this problem includes:    Lantus 100 Unit/ml Soln (Insulin glargine) ..... Inject 55 units subcutaneously once a day disp 3 months    Relion R 100 Unit/ml Soln (Insulin regular human) ..... Inject 18  units subcutaneously three times a day before meals.  dispense quantity sufficient for one month supply.    Aspirin Adult Low Strength 81 Mg Tbec (Aspirin) .Marland Kitchen... 1 by mouth once daily    Lisinopril 5 Mg Tabs (Lisinopril) ..... One daily  Orders: [facility]- Est Level  3 ([id number])  Complete Medication List: 1)  Lantus 100 Unit/ml Soln (Insulin glargine) .... Inject 55 units subcutaneously once a day disp 3 months 2)  Prilosec Otc 20 Mg Tbec (Omeprazole magnesium) .... Take 1 tablet by mouth every morning 3)  Relion R 100 Unit/ml Soln (Insulin regular human) .... Inject 18  units subcutaneously three times a day before meals.  dispense quantity sufficient for one month supply. 4)  Zocor 20 Mg Tabs (Simvastatin) .... Take 1 tablet by mouth every night 5)  Senior Multivitamin Plus Tabs (Multiple vitamins-minerals) .Marland Kitchen.. 1 by mouth qd 6)  Aspirin Adult Low Strength 81 Mg Tbec (Aspirin) .Marland Kitchen.. 1 by mouth once daily 7)  Lorazepam 0.5 Mg Tabs (Lorazepam) .... One two times a day as needed for anxiety 8)  Ms Contin 15 Mg Xr12h-tab (Morphine sulfate) .... Take 1 by mouth two times a day for back pain. 9)  Torsemide 20 Mg Tabs (Torsemide) .Marland Kitchen.. 1 by mouth two times a day for fluid 10)  Percocet 10-325 Mg Tabs (Oxycodone-acetaminophen) .... One tab three times a day 11)  Lisinopril 5 Mg Tabs (Lisinopril) .... One daily 12)  Nystatin 100000 Unit/gm Powd (Nystatin) .... Apply to affected area bid 13)  Iron 325 (65 Fe) Mg Tabs (Ferrous sulfate) .... Take 1 by mouth daily for low iron 14)  Promethazine Hcl 25 Mg Tabs (Promethazine hcl) .... Take 1 by mouth every 6 hours as needed for nausea 15)  Senokot 8.6 Mg Tabs (Sennosides) .... 3 tablets by mouth daily for constipation 16)  Sorbitol 70 % Soln  (Sorbitol) .... Take 2 tbsp every 2 hours until bm, then take 2 tbsp daily-qs  Patient Instructions: 1)  Change Regular insulin to 18 units prior to each meal.  2)  Continue taking lantus 55 units each at in the evening.  3)  No other changes today.  4)  I hope you feel better.  Prescriptions: RELION R 100 UNIT/ML SOLN (INSULIN REGULAR HUMAN) Inject 18  units subcutaneously three times a day before meals.  Dispense quantity sufficient for one month supply.  #1  x 5   Entered by:   Christian Mate D   Authorized by:   Atalya Dano [place] MD   Signed by:   Madelon Lips Pharm D on 12/12/2009   Method used:   Faxed to ...       [facility] ([facility])       [address]       [place], [place]  [address]       Ph: [phone]       Fax: [phone]   RxID:   [id number]

## 2010-11-15 NOTE — Progress Notes (Signed)
Summary: phn msg  Phone Note Call from Patient Call back at Home Phone [phone]   Caller: Patient Summary of Call: pt is asking to speak w/ Dr [place] Initial call taken by: De Nurse,  September 14, 2010 9:41 AM  Follow-up for Phone Call        Pt reports she is in really bad abdominal pain.  Constant pain, unchanged with eating.  Feels like a knife going through R side of abdomen to back.  Taking 4 percocets a day on top of her scheduled morphine.  Reports percocets and hot rags help ease the pain for a few hours, but then it returns. She is also being evicted from apt and must move by next WEdnesday, Dec 7th.  She does not know where she will go, but is going to hear on Monday from one possiblity. Discussed the results of the ultrasound with Ms. Scovell.  I am unclear why she is in such severe pain.  I feel further work up is needed.  She states she could go to the ER tomorrow, but not tonight.  I also told her that I could see her Monday morning if necessary to follow up from the ER.   Ms. Leavens is a very ill woman with several severe medical problems, so I am concerned about this pain.  She is also in a very difficulty social situation with limited financial resources and caretakers who have substance use problems, so controlled substances certain have risks in her household, and I am aware that there could be secondary gain for her obtaining narcotics.   Pt agrees to try to go to ED this weekend unless she is feeling better, in which case she will wait until she can see me. Follow-up by: Sarah [place] MD,  September 14, 2010 4:13 PM

## 2010-11-15 NOTE — Progress Notes (Signed)
Summary: rx for omeprazole  Phone Note Refill Request Call back at Home Phone [phone]   pt is about to run out of prilosec since it was increased to twice daily  Initial call taken by: Knox Royalty,  February 05, 2010 11:19 AM  Follow-up for Phone Call        forward to pcp Follow-up by: Loralee Pacas CMA,  February 05, 2010 12:09 PM  Additional Follow-up for Phone Call Additional follow up Details #1::        Faxed to pharmacy.  Pt can take 1 pill daily because it is twice the dose of her current pill. Additional Follow-up by: Sarah [place] MD,  February 06, 2010 8:52 AM    Prescriptions: PRILOSEC 40 MG CPDR (OMEPRAZOLE) 1 by mouth Daily for stomach pain  #30 x 2   Entered and Authorized by:   Sarah [place] MD   Signed by:   Sarah [place] MD on 02/06/2010   Method used:   Faxed to ...       [facility] (retail)       [address]       [place], [place]  [address]       Ph: [phone]       Fax: [phone]   RxID:   [id number]   Appended Document: rx for omeprazole pt called about rx and can pick up at her convience

## 2010-11-15 NOTE — Consult Note (Signed)
Summary: [facility]   Imported By: Clydell Hakim 04/18/2010 14:55:59  _____________________________________________________________________  External Attachment:    Type:   Image     Comment:   External Document

## 2010-11-15 NOTE — Miscellaneous (Signed)
Summary: call from [facility] re: CP  Clinical Lists Changes Josh, a nurse with [facility] called & LM for the pcp. states 2 days ago she has R sided Cpressure, SOB, N&V. she is ok now. I called & spoke with him. she has an appt on 06/21/10. I then spoke with the pt. c/o sob when she is without the O2. she has the O2 on now. used meds she had for the n &v. says the doctor was going to give her a transfusion. pcp not here. will check on this & call pt back.Marland KitchenMarland KitchenGolden Circle RN  June 11, 2010 2:01 PM  reviewed by Dr. Mauricio Po. informed pt that pcp will address in am. does not look like she needs a transfusion at this time per Dr. Mauricio Po. I or md will call her tomorrow.  if these symptoms occur again, call 911 & go to ED. she agreed with plan.Golden Circle RN  June 11, 2010 2:41 PM  Pt does not need a transfusion at this time.  I had asked her if she would be interested in a transfusion if her labs and symptoms indicated.  She is currently in hospital being evaluated for chest pain. Sarah [place] MD  June 14, 2010 3:09 PM

## 2010-11-15 NOTE — Progress Notes (Signed)
Summary: Nuc Med appt  Phone Note From Other Clinic   Caller: Nuclear med dept Summary of Call: wanted dr Sw[place]iland to know pt noshowed her appt due to transportation problems, if MD wants pt to be rescheduled the process must start over.  Initial call taken by: Knox Royalty,  October 16, 2010 2:22 PM

## 2010-11-15 NOTE — Miscellaneous (Signed)
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 they do pill box refills & monitoring of her skin breakdown. pt is still refusing duederm on buttocks. told nurse that it makes it feel worse. she will continue to monitor & advise of status. they are sending the forms here for pcp to sign.Golden Circle RN  April 26, 2010 11:16 AM  I will be in office on 04/30/10 and will look for forms to sign Sarah [place] MD  April 27, 2010 3:49 PM

## 2010-11-15 NOTE — Miscellaneous (Signed)
Summary: GI records  Clinical Lists Changes Reviewed records from [facility].  Abd pain of unclear etiology.  Pt does have gallstones, but unclear that this is cause of her pain.  STarted on carafate, which is likely med pt mentioned to me in clinic.  Appended Document: GI records    Clinical Lists Changes  Medications: Added new medication of CARAFATE 1 GM TABS (SUCRALFATE) 1 by mouth three times a day before meals Added new medication of DEXILANT 60 MG CPDR (DEXLANSOPRAZOLE) 1 by mouth daily

## 2010-11-15 NOTE — Assessment & Plan Note (Signed)
Summary: F/U per [name] / [facility]   Vital Signs:  Patient profile:   70 year old female Height:      66 inches  Vitals Entered By: Wyona Almas PHD (October 16, 2009 11:58 AM)  Primary Care Provider:  Sarah [place] MD   History of Present Illness: Assessment:  Saw patient for 30 minutes.  Blood glucose levels are erratic.  in the past couple of weeks, Vaneta had three fasting BG near or at 100; others are over 200.  Otherwise lunch consists of a yogurt, banana or orange or pear, skim milk, and Crystal Light.  Desaree said she does not eat fruit with her lunch on days when her BG is >200 before lunch, although she does not look back at her food record to try understand why BGs are high or low.  A nurse is consistently drawing up her insulin now, so dosages should be correct.    Nutrition Diagnosis:  No changes in physical inactivity (NB-2.1) related to disability as evidenced by wheelchair requirement.  Cannot evaluate progress on imbalance of nutrients (NI5.5) related to carbohydrates in absence of food records, although inconsistent blood sugars suggest poor dietary control.    Monitoring/Eval: Dietary intake and exercise at 5-wk F/U.    Allergies: 1)  ! Amoxicillin 2)  ! Ibuprofen 3)  ! Haldol 4)  Naprosyn (Naproxen) 5)  Norvasc (Amlodipine Besylate)   Complete Medication List: 1)  Lantus 100 Unit/ml Soln (Insulin glargine) .... Inject 60 units subcutaneously once a day disp 3 months 2)  Prilosec Otc 20 Mg Tbec (Omeprazole magnesium) .... Take 1 tablet by mouth every morning 3)  Relion R 100 Unit/ml Soln (Insulin regular human) .... Inject 15 unit subcutaneously three times a day 4)  Zocor 20 Mg Tabs (Simvastatin) .... Take 1 tablet by mouth every night 5)  Senior Multivitamin Plus Tabs (Multiple vitamins-minerals) .Marland Kitchen.. 1 by mouth qd 6)  Aspirin Adult Low Strength 81 Mg Tbec (Aspirin) .Marland Kitchen.. 1 by mouth once daily 7)  Lorazepam 0.5 Mg Tabs (Lorazepam) .... One two times a day as  needed for anxiety 8)  Ms Contin 15 Mg Xr12h-tab (Morphine sulfate) .... Take 1 by mouth two times a day for back pain. 9)  Torsemide 20 Mg Tabs (Torsemide) .Marland Kitchen.. 1 by mouth two times a day for fluid 10)  Percocet 10-325 Mg Tabs (Oxycodone-acetaminophen) .... One tab three times a day 11)  Lisinopril 5 Mg Tabs (Lisinopril) .... One daily 12)  Metformin Hcl 500 Mg Xr24h-tab (Metformin hcl) .... One daily 13)  Nystatin 100000 Unit/gm Powd (Nystatin) .... Apply to affected area bid 14)  Meclizine Hcl 25 Mg Tabs (Meclizine hcl) .... 1/2 -one tab three times a day as needed for vertigo 15)  Iron 325 (65 Fe) Mg Tabs (Ferrous sulfate) .... Take 1 by mouth daily for low iron  Other Orders: Reassessment Each 15 min unit- [facility] [phone])  Patient Instructions: 1)  WHEN YOUR BLOOD SUGAR IS VERY EITHER HIGH OR VERY LOW, CHECK YOUR FOOD RECORD TO SEE WHAT YOUR LAST FOOD CHOICES WERE, SO YOU WILL KNOW IF IT'S FOOD-RELATED OR BECAUSE OF MISSING INSULIN OR SOME OTHER REASON.   2)  IF YOU SEE A FOOD-RELATED BLOOD SUGAR READING, MAKE A COMMENT ON YOUR FOOD RECORD ABOUT IT.   3)  I WOULD STILL LIKE TO SEE YOU GET A VEGETABLE SERVING WITH LUNCH. 4)  THE BEST FRUITS FOR YOU AT THIS TIME OF YEAR:  PEARS, APPLE SAUCE OR BAKED APPLES, ANY  BERRIES (FROZEN OR FRESH) AND PRUNES (3 OR 4). 5)  NEXT NUTRITION APPT IS FEB 7 AT NOON.

## 2010-11-15 NOTE — Assessment & Plan Note (Signed)
Summary: F/U per [name] / [facility]   Vital Signs:  Patient profile:   70 year old female Height:      66 inches  Vitals Entered By: Wyona Almas PHD (November 20, 2009 12:01 PM)  Primary Care Provider:  Sarah [place] MD   History of Present Illness: Assessment: Spent 30 min with pt.  Shambhavi said she has felt sick for about 3 weeks; chronic nausea and feeling like she is constipated whether or not she has had a bowel movement.  She also has had severe itchiness around face and neck for the last 2 weeks, and she said she is not sleeping well at all.  She has been eating three meals a day, but they are small, as appetite is reduced with nausea.  She has continued to keep BG logs as well as a food record, which her aide is now recording for her (and which is clearly incomplete, i.e., no mention of the 12 oz prune juice and  ~6 prunes she has been having daily).  Lunch consists of milk or yogurt with 2 pieces of fruit on most days.  In the past couple weeks, FBG have included 4 >200, 8 150-200, and her lowest was 137.    Nutrition Diagnosis:  No changes in physical inactivity (NB-2.1) related to disability as evidenced by wheelchair requirement.  Regression on imbalance of nutrients (NI5.5) related to carbohydrates as evidenced by inclusion of 12 oz prune juice daily.    Intervention:  See Patient Instructions.   Monitoring/Eval: Dietary intake and exercise at 5-wk F/U.   Allergies: 1)  ! Amoxicillin 2)  ! Ibuprofen 3)  ! Haldol 4)  Naprosyn (Naproxen) 5)  Norvasc (Amlodipine Besylate)   Complete Medication List: 1)  Lantus 100 Unit/ml Soln (Insulin glargine) .... Inject 60 units subcutaneously once a day disp 3 months 2)  Prilosec Otc 20 Mg Tbec (Omeprazole magnesium) .... Take 1 tablet by mouth every morning 3)  Relion R 100 Unit/ml Soln (Insulin regular human) .... Inject 15 unit subcutaneously three times a day 4)  Zocor 20 Mg Tabs (Simvastatin) .... Take 1 tablet by mouth every  night 5)  Senior Multivitamin Plus Tabs (Multiple vitamins-minerals) .Marland Kitchen.. 1 by mouth qd 6)  Aspirin Adult Low Strength 81 Mg Tbec (Aspirin) .Marland Kitchen.. 1 by mouth once daily 7)  Lorazepam 0.5 Mg Tabs (Lorazepam) .... One two times a day as needed for anxiety 8)  Ms Contin 15 Mg Xr12h-tab (Morphine sulfate) .... Take 1 by mouth two times a day for back pain. 9)  Torsemide 20 Mg Tabs (Torsemide) .Marland Kitchen.. 1 by mouth two times a day for fluid 10)  Percocet 10-325 Mg Tabs (Oxycodone-acetaminophen) .... One tab three times a day 11)  Lisinopril 5 Mg Tabs (Lisinopril) .... One daily 12)  Metformin Hcl 500 Mg Xr24h-tab (Metformin hcl) .... One daily 13)  Nystatin 100000 Unit/gm Powd (Nystatin) .... Apply to affected area bid 14)  Iron 325 (65 Fe) Mg Tabs (Ferrous sulfate) .... Take 1 by mouth daily for low iron 15)  Fluconazole 150 Mg Tabs (Fluconazole) .... One tab every other day until complete.  Other Orders: Reassessment Each 15 min unit- [facility] [phone])  Patient Instructions: 1)  PLEASE ASK YOUR AIDE TO WRITE DOWN HOW MUCH YOU EAT OF THE FOODS [facility] RECORDS EACH DAY.  IT IS ALSO IMPORTANT TO BE SURE TO INCLUDE ALL FOOD AND DRINKS, SUCH AS PRUNES AND PRUNE JUICE! 2)  TALK TO DR Swaziland ON THURSDAY ABOUT CONSTIPATION.  IT WOULD BE BETTER TO USE SOMETHING OTHER THAN PRUNE JUICE.   3)  YOU CAN USE UP THE PRUNE JUICE YOU HAVE AT HOME BY USING NO MORE THAN 4 OZ PER DAY.  4)  YOUR FOLLOW-UP NUTRITION APPT IS ON MAR 7 AT NOON.   5)  CALL DR. [name] IF ANY QUESTIONS:  [phone].

## 2010-11-15 NOTE — Assessment & Plan Note (Signed)
Summary: Wants percocets   Vital Signs:  Patient profile:   70 year old female Weight:      282 pounds Temp:     98.7 degrees F oral Pulse rate:   66 / minute Pulse rhythm:   regular BP sitting:   138 / 71  (left arm) Cuff size:   regular  Vitals Entered By: Loralee Pacas CMA (October 04, 2010 1:25 PM)  Primary Care Provider:  Sarah Swaziland MD   History of Present Illness: 70 yo here to follow up.  She is having continued abdominal pain.  Reports she has still been taking percocets 10 mg TIDto QID because the morphine is not working (is taking MS Contin 30 mg BID).  Still with some constipation,but had large BM last night.  Pain is same, stabbing, radiates to back.  Pain relieved with percocets for a short time.  Feels percocets help much more than morphine.  States that morpine helps some, but very adamant that she needs percocets. She went for HIDA scan, but had taken her morphine that morning and they could not do the scan.  She is reschedlued for tomorrow.   Son, [name], is here with her today. He insists that she needs the percocets,and states that he will bring the other 30 mg tablets of morphine back to clinic so we can decrease her dose of morphine.  He has lots of paperwork with him about their pending eviction.  He talked during the visit of their troubles with finances and with the upcoming eviction.  + smell of ETOH, and he acknowledges that he has been drinking more with the stress of finding housing.  Must be out of apt  by January 8th.  Has Legal Aid involved.  Son states they can find something and can borrow money to pay for a place if needed.  She declines nursing home placement.   [name] is very insistent that Ms. Agena get percocets instead of the morphine.  He reports that she is hurting so badly that sometimes she is crying in the night, and he has to give her a 4th pill.  He states that he is not selling her meds, and that we test her blood every time and we know  exactly what she is taking.  Had one low glucose of 40, drank orange juice and was fine.        Allergies: 1)  ! Amoxicillin 2)  ! Ibuprofen 3)  ! Haldol 4)  Naprosyn (Naproxen) 5)  Norvasc (Amlodipine Besylate)  Review of Systems       see HPI  Physical Exam  General:  Obese, in no acute distress; alert,appropriate and cooperative throughout examination Eyes:  Pinpoint pupils.  No conjunctival discharge or injection. Abdomen:  Skin without lesion or erythema. Small hematomas noted, c/w insulin injections.  No mass or HSM, but exam severely limited by body habitus.  No tenderness to light palpation.  +BS   Impression & Recommendations:  Problem # 1:  ABDOMINAL PAIN, RIGHT UPPER QUADRANT (ICD-789.01)  Work up still pending for possible gall bladder etiology.  If pain is caused by cholecystitis, it is possible that morphine is not the best med to treat her pain.  However, the picture is very concerning for med diversion given the pt and her son's insistence that they need percocets.  Will put pt back on her earlier regimen of 15 mg MS Contin BID  and 10 mg percocets, to be taken three times a day with  occasional QID if needed.  Will prescribe 85 tablets as son reports they still have 15 of the percocets left at home.  This should provide enough percocets to last for 30 days.  Pt was instructed to stop the 30 mg MS Contin pills.  [name] states that he will return to clinic today with those meds, so that we can change the rx to 15 mg.  Consider random urine drug screen through [facility].   Her updated medication list for this problem includes:    Metoclopramide Hcl 5 Mg Tabs (Metoclopramide hcl) .Marland Kitchen... 1 by mouth before meals and qhs  Orders: [facility]- Est Level  3 (41324)  Problem # 2:  OTHER SPECIFIED HOUSING/ECONOMIC CIRCUMSTANCES (ICD-V60.89)  Pt working with Legal Aid and trying to find a new place.  The pending eviction should not be a surprise to pt or her family, as they  have received several notices about this.  I offered placement in a nursing home, but Ms. Texeira and her son refuse this.    Orders: [facility]- Est Level  3 (40102)  Complete Medication List: 1)  Lantus 100 Unit/ml Soln (Insulin glargine) .... Inject 55 units subcutaneously once a day disp 3 months 2)  Relion R 100 Unit/ml Soln (Insulin regular human) .... Inject 18  units subcutaneously three times a day before meals.  dispense quantity sufficient for one month supply. 3)  Zocor 20 Mg Tabs (Simvastatin) .... Take 1 tablet by mouth every night 4)  Senior Multivitamin Plus Tabs (Multiple vitamins-minerals) .Marland Kitchen.. 1 by mouth qd 5)  Aspirin Adult Low Strength 81 Mg Tbec (Aspirin) .Marland Kitchen.. 1 by mouth once daily 6)  Lorazepam 0.5 Mg Tabs (Lorazepam) .... One two times a day as needed for anxiety 7)  Torsemide 20 Mg Tabs (Torsemide) .Marland Kitchen.. 1 by mouth two times a day for fluid 8)  Lisinopril 5 Mg Tabs (Lisinopril) .... One daily 9)  Nystatin 100000 Unit/gm Powd (Nystatin) .... Apply to affected area bid 10)  Iron 325 (65 Fe) Mg Tabs (Ferrous sulfate) .... Take 1 by mouth daily for low iron 11)  Promethazine Hcl 25 Mg Tabs (Promethazine hcl) .... Take 1 by mouth every 6 hours as needed for nausea 12)  Senokot 8.6 Mg Tabs (Sennosides) .... 3 tablets by mouth daily for constipation 13)  Sorbitol 70 % Soln (Sorbitol) .... Take 2 tbsp every 2 hours until bm, then take 2 tbsp daily-qs 14)  Erythromycin Base 250 Mg Tabs (Erythromycin base) .... Take 1 by mouth 30 minutes before meals 15)  Metoclopramide Hcl 5 Mg Tabs (Metoclopramide hcl) .Marland Kitchen.. 1 by mouth before meals and qhs 16)  Fluconazole 150 Mg Tabs (Fluconazole) .Marland Kitchen.. 1 by mouth once 17)  Nystatin 100000 Unit/gm Powd (Nystatin) .... Apply to affected area bid 18)  Nystatin 100000 Unit/gm Crea (Nystatin) .... Apply to affected area bid 19)  Carafate 1 Gm Tabs (Sucralfate) .Marland Kitchen.. 1 by mouth three times a day before meals 20)  Dexilant 60 Mg Cpdr (Dexlansoprazole)  .Marland Kitchen.. 1 by mouth daily 21)  Plavix 75 Mg Tabs (Clopidogrel bisulfate) .Marland Kitchen.. 1 by mouth daily 22)  Crestor 40 Mg Tabs (Rosuvastatin calcium) .Marland Kitchen.. 1 by mouth q hs for cholesterol 23)  Ranitidine Hcl 150 Mg Caps (Ranitidine hcl) .Marland Kitchen.. 1 by mouth two times a day for stomach. 24)  Ms Contin 30 Mg Xr12h-tab (Morphine sulfate) .... Take 1 tablet twice daily.  start these after you run out of the other morphine tablets. 25)  Metoprolol Tartrate 25 Mg  Tabs (Metoprolol tartrate) .Marland Kitchen.. 1 by mouth two times a day 26)  Percocet 10-325 Mg Tabs (Oxycodone-acetaminophen) .... Take up to every 6 hours for severe pain. this should last you for 30 days with the other percocets that you still have at home.  Patient Instructions: 1)  Please bring your morpine pills back today.  We will decrease the morphine to 15 mg twice a day.  Stop the 30 mg of morphine every day.   2)  We will give you a prescription for the 85 percocets.   3)  Come back in 2 weeks to see me. 4)  Good luck with Legal Aid. 5)  You gave me back the prescription for the 15 percocets that I wrote on 09/17/10, and I tore it up while you were here for our visit. Prescriptions: PERCOCET 10-325 MG TABS (OXYCODONE-ACETAMINOPHEN) Take up to every 6 hours for severe pain. This should last you for 30 days with the other percocets that you still have at home.  #85 x 0   Entered and Authorized by:   Sarah Swaziland MD   Signed by:   Sarah Swaziland MD on 10/04/2010   Method used:   Print then Give to Patient   RxID:   [id number] PERCOCET 10-325 MG TABS (OXYCODONE-ACETAMINOPHEN) Take up to every 6 hours for severe pain. This should last you for 30 days with the other percocets that you still have at home.  #80 x 0   Entered and Authorized by:   Sarah Swaziland MD   Signed by:   Sarah Swaziland MD on 10/04/2010   Method used:   Print then Give to Patient   RxID:   [id number] PERCOCET 10-325 MG TABS (OXYCODONE-ACETAMINOPHEN)   #80 x 0   Entered and Authorized  by:   Sarah Swaziland MD   Signed by:   Sarah Swaziland MD on 10/04/2010   Method used:   Print then Give to Patient   RxID:   [id number]    Orders Added: 1)  [facility]- Est Level  3 [01027]

## 2010-11-15 NOTE — Assessment & Plan Note (Signed)
Summary: FU/tcb   Vital Signs:  Patient profile:   70 year old female Height:      66 inches Temp:     98.1 degrees F oral Pulse rate:   68 / minute BP sitting:   120 / 58  (right arm) Cuff size:   large  Vitals Entered By: Tessie Fass CMA (October 20, 2009 11:19 AM) CC: dysuria, urgency Is Patient Diabetic? Yes Pain Assessment Patient in pain? yes     Location: vaginal Intensity: 10   Primary Care Provider:  Sarah Swaziland MD  CC:  dysuria and urgency.  History of Present Illness: Here for every other week visit, needs refills on her narcotics.  Discussed importance of one provider providing these, and that she should make apt with primary MD every month.    Describes dysuria in her own words, and urinary frequency.  Her blood sugar ws 378 this morning as she forgot to take her Lantus last evening.  She is not sure why.  She remains on high dose dieuretics.  Her mobility is too poor to move onto exam table for proper GYN exam.    Habits & Providers  Alcohol-Tobacco-Diet     Tobacco Status: never  Current Medications (verified): 1)  Lantus 100 Unit/ml Soln (Insulin Glargine) .... Inject 60 Units Subcutaneously Once A Day Disp 3 Months 2)  Prilosec Otc 20 Mg Tbec (Omeprazole Magnesium) .... Take 1 Tablet By Mouth Every Morning 3)  Relion R 100 Unit/ml Soln (Insulin Regular Human) .... Inject 15 Unit Subcutaneously Three Times A Day 4)  Zocor 20 Mg Tabs (Simvastatin) .... Take 1 Tablet By Mouth Every Night 5)  Senior Multivitamin Plus  Tabs (Multiple Vitamins-Minerals) .Marland Kitchen.. 1 By Mouth Qd 6)  Aspirin Adult Low Strength 81 Mg Tbec (Aspirin) .Marland Kitchen.. 1 By Mouth Once Daily 7)  Lorazepam 0.5 Mg Tabs (Lorazepam) .... One Two Times A Day As Needed For Anxiety 8)  Ms Contin 15 Mg Xr12h-Tab (Morphine Sulfate) .... Take 1 By Mouth Two Times A Day For Back Pain. 9)  Torsemide 20 Mg Tabs (Torsemide) .Marland Kitchen.. 1 By Mouth Two Times A Day For Fluid 10)  Percocet 10-325 Mg Tabs  (Oxycodone-Acetaminophen) .... One Tab Three Times A Day 11)  Lisinopril 5 Mg Tabs (Lisinopril) .... One Daily 12)  Metformin Hcl 500 Mg Xr24h-Tab (Metformin Hcl) .... One Daily 13)  Nystatin 100000 Unit/gm Powd (Nystatin) .... Apply To Affected Area Bid 14)  Iron 325 (65 Fe) Mg Tabs (Ferrous Sulfate) .... Take 1 By Mouth Daily For Low Iron 15)  Fluconazole 150 Mg Tabs (Fluconazole) .... One Tab Times One  Allergies (verified): 1)  ! Amoxicillin 2)  ! Ibuprofen 3)  ! Haldol 4)  Naprosyn (Naproxen) 5)  Norvasc (Amlodipine Besylate)  Physical Exam  General:  Ususal, long facial hair today.  Increased tartive dyskinesia of mouth. Mouth:  dry membranes Lungs:  normal respiratory effort and normal breath sounds., wearing O2   Heart:  normal rate and regular rhythm.     Impression & Recommendations:  Problem # 1:  URINARY FREQUENCY (ICD-788.41) Likely multifactorial with high blood sugars, over 300 4 times this week and high dose torsemide.  Urine very dilute today, teaching regading the association of high blood sugars and high urinary output  Problem # 2:  MORBID OBESITY (ICD-278.01) Major problem with mobility, we were unable to transfer her onto a table for exam.  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  Beleive that she does intermittenly not take inuslin as directed. Her updated medication list for this problem includes:    Lantus 100 Unit/ml Soln (Insulin glargine) ..... Inject 60 units subcutaneously once a day disp 3 months    Relion R 100 Unit/ml Soln (Insulin regular human) ..... Inject 15 unit subcutaneously three times a day    Aspirin Adult Low Strength 81 Mg Tbec (Aspirin) .Marland Kitchen... 1 by mouth once daily    Lisinopril 5 Mg Tabs (Lisinopril) ..... One daily    Metformin Hcl 500 Mg Xr24h-tab (Metformin hcl) ..... One daily  Complete  Medication List: 1)  Lantus 100 Unit/ml Soln (Insulin glargine) .... Inject 60 units subcutaneously once a day disp 3 months 2)  Prilosec Otc 20 Mg Tbec (Omeprazole magnesium) .... Take 1 tablet by mouth every morning 3)  Relion R 100 Unit/ml Soln (Insulin regular human) .... Inject 15 unit subcutaneously three times a day 4)  Zocor 20 Mg Tabs (Simvastatin) .... Take 1 tablet by mouth every night 5)  Senior Multivitamin Plus Tabs (Multiple vitamins-minerals) .Marland Kitchen.. 1 by mouth qd 6)  Aspirin Adult Low Strength 81 Mg Tbec (Aspirin) .Marland Kitchen.. 1 by mouth once daily 7)  Lorazepam 0.5 Mg Tabs (Lorazepam) .... One two times a day as needed for anxiety 8)  Ms Contin 15 Mg Xr12h-tab (Morphine sulfate) .... Take 1 by mouth two times a day for back pain. 9)  Torsemide 20 Mg Tabs (Torsemide) .Marland Kitchen.. 1 by mouth two times a day for fluid 10)  Percocet 10-325 Mg Tabs (Oxycodone-acetaminophen) .... One tab three times a day 11)  Lisinopril 5 Mg Tabs (Lisinopril) .... One daily 12)  Metformin Hcl 500 Mg Xr24h-tab (Metformin hcl) .... One daily 13)  Nystatin 100000 Unit/gm Powd (Nystatin) .... Apply to affected area bid 14)  Iron 325 (65 Fe) Mg Tabs (Ferrous sulfate) .... Take 1 by mouth daily for low iron 15)  Fluconazole 150 Mg Tabs (Fluconazole) .... One tab times one  Other Orders: Urinalysis-[facility] (00000)  Patient Instructions: 1)  Primary MD visit Prescriptions: FLUCONAZOLE 150 MG TABS (FLUCONAZOLE) one tab times one Brand medically necessary #1 x 0   Entered and Authorized by:   Luretha Murphy NP   Signed by:   Luretha Murphy NP on 10/20/2009   Method used:   Print then Give to Patient   RxID:   [id number] PERCOCET 10-325 MG TABS (OXYCODONE-ACETAMINOPHEN) one tab three times a day Brand medically necessary #90 x 0   Entered and Authorized by:   Luretha Murphy NP   Signed by:   Luretha Murphy NP on 10/20/2009   Method used:   Print then Give to Patient   RxID:   [id number] MS CONTIN 15 MG XR12H-TAB  (MORPHINE SULFATE) Take 1 by mouth two times a day for back pain. Brand medically necessary #60 x 0   Entered and Authorized by:   Luretha Murphy NP   Signed by:   Luretha Murphy NP on 10/20/2009   Method used:   Print then Give to Patient   RxID:   [id number]   Laboratory Results   Urine Tests  Date/Time Received: October 20, 2009 12:00 PM  Date/Time Reported: October 20, 2009 12:02 PM   Routine Urinalysis   Color: yellow Appearance: Clear Glucose: 500   (Normal Range: Negative) Bilirubin: negative   (Normal Range: Negative) Ketone: negative   (Normal Range: Negative) Spec.  Gravity: <1.005   (Normal Range: 1.003-1.035) Blood: negative   (Normal Range: Negative) pH: 5.5   (Normal Range: 5.0-8.0) Protein: negative   (Normal Range: Negative) Urobilinogen: 0.2   (Normal Range: 0-1) Nitrite: negative   (Normal Range: Negative) Leukocyte Esterace: negative   (Normal Range: Negative)    Comments: ...........test performed by...........Marland KitchenTerese Door, CMA

## 2010-11-15 NOTE — Miscellaneous (Signed)
Summary: swollen red L great toe this am  Clinical Lists Changes states she woke up this am with her L great toes swollen & red/purple. states she has no way to get here or to UC. we have no appts here today. I will send this to pcp so she can call the pt this afternoon.Golden Circle RN  July 27, 2010 10:51 AM  Called pt and spoke with pt and son, [name].  Pt reports she woke up this am with a swollen, purple toe.  Does not remember any injury to it.  Taking Plavix as prescribed.  No pain, but has no feeling in that foot (chronic).  No change in toe appearance during day today.   Pt informed this could be a clot and that she could lose her toe, or it may be bruised from an unknown injury.  We cannot adequately evaluate over the phone.  She can only be transported by ambulance if she needs to be seen, so ED is only option. She and son state that if it worsens, they will call ambulance for transport to ED,but otherwise they prefer to remain at home.  They understand that I cannot adequately diagnose or treat her toe over the phone. Sarah Swaziland MD  July 27, 2010 5:58 PM

## 2010-11-15 NOTE — Progress Notes (Signed)
Summary: Pt declined PT home services  Phone Note From Other Clinic Call back at [phone]   Caller: [facility] Summary of Call: need orders for PT, nurse eval w/ diabetic management, & pt is also requesting a Rollater she also needs demographics of pts hx and meds list fax to [phone] Initial call taken by: De Nurse,  Feb 27, 2010 1:35 PM  Follow-up for Phone Call        Pt already with [facility].  She has appt today.  I will ask her today if she is switching. Follow-up by: Sarah Swaziland MD,  Feb 28, 2010 8:43 AM  Additional Follow-up for Phone Call Additional follow up Details #1::        Pt happy with [facility]. Does not plan to switch.  Would like to to try to go to [facility] on [address] Additional Follow-up by: Sarah Swaziland MD,  Feb 28, 2010 9:37 PM

## 2010-11-15 NOTE — Progress Notes (Signed)
Summary: Orders Needed for recert for [facility]  Phone Note Other Incoming Call back at [phone]   Caller: Vinnie Langton [facility] Summary of Call: Needs orders to recert Ms. Matranga Initial call taken by: Clydell Hakim,  January 02, 2010 12:02 PM  Follow-up for Phone Call        Spoke with Vinnie Langton.  I agree with recertifying Ms. Ketner. He will send paperwork via fax. Follow-up by: Kamarie Veno Swaziland MD,  January 03, 2010 10:27 AM

## 2010-11-15 NOTE — Progress Notes (Signed)
Summary: Call from [address] [facility] with [facility]  Phone Note Other Incoming   Caller: Nehemiah Settle with [facility] [phone] Summary of Call: Nehemiah Settle was calling to let us know she went out to see patient today to fill pill box and check on Ms. Nagorski.  She states that Kevona has another pressure ulcer on her right buttocks.  She also hears crackles in her left mid and lower base.  Pt c/o slight chest heaviness.  Pt. has appt. scheduled next week but Brook  just wanted to make [name] aware.  In the meantime she has advised Ms. Chaires to relieve pressure to her right buttocks and apply desitin thicker to the area.  Also states pt. brought up to her that she had 30 days to find a new place to live.  Nehemiah Settle thought patient would benefit from an assisted living facility. Initial call taken by: Terese Door,  August 15, 2010 5:10 PM  Follow-up for Phone Call        Agree that Ms. Kowal could benefit in some ways from assisted living.  Have discussed many times with pt, she refuses.  Will look forward to seeing Ms. Lampe at her appointment, but she can certainly come in sooner as a work in if needed.  Left message for [facility].   Follow-up by: Sarah Swaziland MD,  August 20, 2010 9:50 AM
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
## 2010-11-15 NOTE — Progress Notes (Signed)
Summary: triage  Phone Note Call from Patient Call back at Home Phone [phone]   Caller: Patient Summary of Call: Pt side is really hurting her.  And still having a hard time having a bowel movement. Initial call taken by: Clydell Hakim,  December 04, 2009 9:23 AM  Follow-up for Phone Call        states she has been seen for pain in side. had xrays. last bm "one little ball" yesterday. drank prune juice  & takes senocot daily. had diarrhea prior to that that lasted 3 days. she wants to be seen tomorrow at 11am with a female provider. will see Saxon at 11:15 Follow-up by: Golden Circle RN,  December 04, 2009 9:43 AM

## 2010-11-15 NOTE — Progress Notes (Signed)
Summary: UTI  Phone Note Call from Patient Call back at Home Phone [phone]   Caller: Patient Summary of Call: Can hardly urinate and hurts really bad. Initial call taken by: Clydell Hakim,  December 20, 2009 10:03 AM  Follow-up for Phone Call        states Elnita Maxwell from [facility] came & took a speciman for testing. she thinks she has a UTI. to pcp . no results on screen yet Follow-up by: Golden Circle RN,  December 20, 2009 10:09 AM  Additional Follow-up for Phone Call Additional follow up Details #1::        Cath UA reveiwed from [facility].  Trace protein, 100 blood, neg nit and mod LE with 3-6 WBC and many bacteria.  Pt denies unusual back pain, fevers, chills, vomiting. Will treat the UTI.  Pt will contact us if she develops any of the above symptoms.  Has follow up next week with me. Additional Follow-up by: Arianne Klinge [place] MD,  December 20, 2009 12:20 PM    New/Updated Medications: PYRIDIUM 200 MG TABS (PHENAZOPYRIDINE HCL) Take 1 by mouth 3 times a day for 2 days. CIPROFLOXACIN HCL 250 MG TABS (CIPROFLOXACIN HCL) 1 by mouth two times a day for urine infection. Prescriptions: CIPROFLOXACIN HCL 250 MG TABS (CIPROFLOXACIN HCL) 1 by mouth two times a day for urine infection.  #6 x 0   Entered and Authorized by:   Jaymison Luber [place] MD   Signed by:   Xayla Puzio [place] MD on 12/20/2009   Method used:   Faxed to ...       [facility] (retail)       [address]       [place], [place]  [address]       Ph: [phone]       Fax: [phone]   RxID:   [id number] PYRIDIUM 200 MG TABS (PHENAZOPYRIDINE HCL) Take 1 by mouth 3 times a day for 2 days.  #6 x 0   Entered and Authorized by:   Vincenza Dail [place] MD   Signed by:   Kaye Mitro [place] MD on 12/20/2009   Method used:   Faxed to ...       [facility] (retail)       [address]       [place], [place]  [address]       Ph: [phone]       Fax: [phone]   RxID:   [id number] CIPROFLOXACIN HCL  250 MG TABS (CIPROFLOXACIN HCL) 1 by mouth two times a day for urine infection.  #6 x 0   Entered and Authorized by:   Tamula Morrical [place] MD   Signed by:   Mat Stuard [place] MD on 12/20/2009   Method used:   Print then Give to Patient   RxID:   [id number] PYRIDIUM 200 MG TABS (PHENAZOPYRIDINE HCL) Take 1 by mouth 3 times a day for 2 days.  #6 x 0   Entered and Authorized by:   Kaleya Douse [place] MD   Signed by:   Yitzel Shasteen [place] MD on 12/20/2009   Method used:   Print then Give to Patient   RxID:   [id number]
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
## 2010-11-15 NOTE — Progress Notes (Signed)
  Phone Note Call from Patient   Caller: Patient Call For: [phone] Summary of Call: Ms. Galano will wait for your phone call to inform rx was faxed back to pharmacy Initial call taken by: Abundio Miu,  October 31, 2010 2:50 PM  Follow-up for Phone Call        patient notified that RX has been sent to pharmacy. Follow-up by: Theresia Lo RN,  October 31, 2010 3:19 PM
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
## 2010-11-15 NOTE — Letter (Signed)
Summary: Generic on Letterhead Paper  [facility]     [place], [place]    Phone:   Fax:      [id number] Date:  July 05, 2010  Re:  Holly Kim   TO [facility] IT MAY CONCERN:  I have taken care of Holly Kim during her past two hospitalizations at [facility], with the most recent hospitalization occuring on 07/04/2010. In my opinion, it would be strongly beneficial and absolutely recommended that Holly Kim have someone living with her as a caregiver. Please allow Holly Kim's son and grandson to live with her in order to be certain that she receives the medical therapy that she needs, as she is unable to provide this care for herself.    I greatly appreciate your cooperation and understanding in this matter.  Please call our office [phone]) and ask for myself, Dr. Fara Boros, or the patient's primary care Brandee Markin, Dr. Sw[place], with any questions or concerns.       Sincerely,      Holly Pore MD
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
## 2010-11-15 NOTE — Miscellaneous (Signed)
  Clinical Lists Changes Medical Records Request Received medical records request Records went to [facility], [place], [place] Faxed on 08/22/2009 [facility] Pysher  September 14, 2010 11:40 AM
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
## 2010-11-15 NOTE — Miscellaneous (Signed)
Summary: DM supplies  Clinical Lists Changes rec'd request for DM supplies from [employer]. called pt. she uses [employer] & is not changing companies. I sent the forms back with "denied,not changing".Golden Circle RN  Feb 21, 2010 11:23 AM
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
## 2010-11-15 NOTE — Progress Notes (Signed)
Summary: refill  Phone Note Refill Request Call back at Home Phone [phone] Message from:  Patient  Refills Requested: Medication #1:  RELION R 100 UNIT/ML SOLN Inject 18  units subcutaneously three times a day before meals.  Dispense quantity sufficient for one month supply. needs today  Initial call taken by: De Nurse,  September 21, 2010 2:43 PM    Prescriptions: RELION R 100 UNIT/ML SOLN (INSULIN REGULAR HUMAN) Inject 18  units subcutaneously three times a day before meals.  Dispense quantity sufficient for one month supply.  #1 x 5   Entered and Authorized by:   Sarah Swaziland MD   Signed by:   Sarah Swaziland MD on 09/24/2010   Method used:   Faxed to ...       [facility] (retail)       [address]       [place], [place]  [address]       Ph: [phone]       Fax: [phone]   RxID:   [id number]

## 2010-11-15 NOTE — Progress Notes (Signed)
Summary: refill  Phone Note Refill Request Message from:  Patient  reglan & phenagran  Initial call taken by: De Nurse,  April 18, 2010 11:32 AM    Prescriptions: METOCLOPRAMIDE HCL 5 MG TABS (METOCLOPRAMIDE HCL) 1 by mouth before meals and qhs  #120 x 0   Entered and Authorized by:   Sarah Swaziland MD   Signed by:   Sarah Swaziland MD on 04/18/2010   Method used:   Faxed to ...       [facility] (retail)       [address]       [place], [place]  [address]       Ph: [phone]       Fax: [phone]   RxID:   [id number] PROMETHAZINE HCL 25 MG TABS (PROMETHAZINE HCL) Take 1 by mouth every 6 hours as needed for nausea  #120 x 0   Entered and Authorized by:   Sarah Swaziland MD   Signed by:   Sarah Swaziland MD on 04/18/2010   Method used:   Faxed to ...       [facility] (retail)       [address]       [place], [place]  [address]       Ph: [phone]       Fax: [phone]   RxID:   [id number]

## 2010-11-15 NOTE — Progress Notes (Signed)
Summary: Pt does not want to go to pain clinic  Phone Note Call from Patient Call back at Home Phone [phone]   Reason for Call: Talk to Doctor Summary of Call: wants to discuss going to pain clinic, pt does not want to go Initial call taken by: Knox Royalty,  October 29, 2010 12:11 PM  Follow-up for Phone Call        fwd to pcp. I did not put in referral just yet, I wanted to know if i still should being that she doesn't want to go Follow-up by: Jimmy Footman, CMA,  October 30, 2010 8:43 AM  Additional Follow-up for Phone Call Additional follow up Details #1::        Called pt.  At first, she says she is "still hurting", but then she reports that she no longer needs the high doses of narcotics.  She does not want to go to the pain clinic because, according to Ms. Shenefield, her home health nurse told her it was not a good place, that they would not give her pain meds and that she would have to sign a paper stating that she would not see other doctors for her pain.  I expained that her pain regimen was way beyond my training and that I needed her to see a pain expert for help with management of her pain.  She then states that her pain is well controlled on her previous regimen, and that she does not need anything more than the MSContin twice a day and the percocets three times a day.   She has not been given an eviction notice.   She says she will not get this until February.  Her brother offered her a house with no stove or refridgerator and with no ramps for $400.00 per month.  She thinks she will not take that. Ms. Ferran pain regimen has been very concerning with escalation in meds requested and now that I am sending her to the pain clinic, she wants to go back to her original regimen and not go to the pain clinic.  She is to go to the pain clinic at least for a consult, and then we can reassess.  She agrees to this plan. Additional Follow-up by: Logyn Kendrick [place] MD,  October 31, 2010 9:18  AM

## 2010-11-15 NOTE — Assessment & Plan Note (Signed)
Summary: F/U VISIT/[facility]   Vital Signs:  Patient profile:   70 year old female Temp:     98.4 degrees F oral Pulse rate:   69 / minute Pulse rhythm:   regular BP sitting:   97 / 46  (left arm) Cuff size:   large  Vitals Entered By: Loralee Pacas CMA (February 08, 2010 11:46 AM) CC: follow-up visit Pain Assessment Patient in pain? yes     Location: abdomen Intensity: 10 Comments right sided flank pain x 4 months   Primary Care Provider:  Asherah Lavoy [place] MD  CC:  follow-up visit.  History of Present Illness: 70 yo female with multiple medical problems here for follow up. Still with contstant, severe abdominal pain.  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  Pt trying higher dose of Prilosec without relief.  + nausea, normal BM, no vomiting.  Taking phenergan with relief.  Pt has also noted bedsores on her buttocks.  She is out of duoderm, but her aide has been applying Desitin to area. Glucose150s-170s fasting.   Thinks she has a yeast infection under her breasts and in groin.  Her aide applies nystatin cream to those areas daily.    Allergies: 1)  ! Amoxicillin 2)  ! Ibuprofen 3)  ! Haldol 4)  Naprosyn (Naproxen) 5)  Norvasc (Amlodipine Besylate)  Review of Systems       seeHPI  Physical Exam  General:  Obese in wheelchair. Abdomen:  + bowel sounds.  No mass  or HSM appreciated, but exam severely limited by body habitus.  TTP R side.  + bruising from insulin shots on abdomen.   Skin:  Stage 2 decubitus ulcers on buttocks.  Attempted to clean off cream and apply tegaderm to area.  Pt unable to stand for long periods of time, so process was difficult, and she was unable to get on exam table. +erythemal under breasts B.  Attempted to see inguinal flods, but unable to examine in seated postion.     Impression & Recommendations:  Problem # 1:  ABDOMINAL PAIN, GENERALIZED (ICD-789.07)  Pt with delayed motility, but unclear if  this is cause of abdominal pain.  E'mycin without relief.  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 She would like to try it again.  Pt has follow up with GI Her updated medication list for this problem includes:    Metoclopramide Hcl 5 Mg Tabs (Metoclopramide hcl) .Marland Kitchen... 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 To be followed by home health when not having clinic appts.  Orders: [facility]- Est Level  3 (52841)  Problem # 3:  FUNGAL DERMATITIS (ICD-111.9)  Fluconazole x 1.  Switch to nystatin powder.  Discussed keeping area dry.   Her updated medication list for this problem includes:    Nystatin 100000 Unit/gm Powd (Nystatin) .Marland Kitchen... Apply to affected area bid    Fluconazole 150 Mg Tabs (Fluconazole) .Marland Kitchen... 1 by mouth once  Orders: [facility]- Est Level  3 (32440)  Complete Medication List: 1)  Lantus 100 Unit/ml Soln (Insulin glargine) .... Inject 55 units subcutaneously once a day disp 3 months 2)  Relion R 100 Unit/ml Soln (Insulin regular human) .... Inject 18  units subcutaneously three times a day before  meals.  dispense quantity sufficient for one month supply. 3)  Zocor 20 Mg Tabs (Simvastatin) .... Take 1 tablet by mouth every night 4)  Senior Multivitamin Plus Tabs (Multiple vitamins-minerals) .Marland Kitchen.. 1 by mouth qd 5)  Aspirin Adult Low Strength 81 Mg Tbec (Aspirin) .Marland Kitchen.. 1 by mouth once daily 6)  Lorazepam 0.5 Mg Tabs (Lorazepam) .... One two times a day as needed for anxiety 7)  Ms Contin 15 Mg Xr12h-tab (Morphine sulfate) .... Take 1 by mouth two times a day for back pain. 8)  Torsemide 20 Mg Tabs (Torsemide) .Marland Kitchen.. 1 by mouth two times a day for fluid 9)  Percocet 10-325 Mg Tabs (Oxycodone-acetaminophen) .... One tab three times a day 10)  Lisinopril 5 Mg Tabs (Lisinopril) .... One daily 11)   Nystatin 100000 Unit/gm Powd (Nystatin) .... Apply to affected area bid 12)  Iron 325 (65 Fe) Mg Tabs (Ferrous sulfate) .... Take 1 by mouth daily for low iron 13)  Promethazine Hcl 25 Mg Tabs (Promethazine hcl) .... Take 1 by mouth every 6 hours as needed for nausea 14)  Senokot 8.6 Mg Tabs (Sennosides) .... 3 tablets by mouth daily for constipation 15)  Sorbitol 70 % Soln (Sorbitol) .... Take 2 tbsp every 2 hours until bm, then take 2 tbsp daily-qs 16)  Erythromycin Base 250 Mg Tabs (Erythromycin base) .... Take 1 by mouth 30 minutes before meals 17)  Pyridium 200 Mg Tabs (Phenazopyridine hcl) .... Take 1 by mouth 3 times a day for 2 days. 18)  Ciprofloxacin Hcl 250 Mg Tabs (Ciprofloxacin hcl) .Marland Kitchen.. 1 by mouth two times a day for urine infection. 19)  Omeprazole 20 Mg Tbec (Omeprazole) .Marland Kitchen.. 1 by mouth two times a day for stomach pain. 20)  Metoclopramide Hcl 5 Mg Tabs (Metoclopramide hcl) .Marland Kitchen.. 1 by mouth before meals and qhs 21)  Fluconazole 150 Mg Tabs (Fluconazole) .Marland Kitchen.. 1 by mouth once 22)  Nystatin 100000 Unit/gm Powd (Nystatin) .... Apply to affected area bid  Patient Instructions: 1)  Keep the area affected by the yeast dry.  We will change to Nystatin powder. Prescriptions: NYSTATIN 100000 UNIT/GM POWD (NYSTATIN) Apply to affected area BID  #1 bottle x 3   Entered and Authorized by:   Tan Clopper [place] MD   Signed by:   Janyah Singleterry [place] MD on 02/08/2010   Method used:   Print then Give to Patient   RxID:   [id number] FLUCONAZOLE 150 MG TABS (FLUCONAZOLE) 1 by mouth once  #1 x 0   Entered and Authorized by:   Jasier Calabretta [place] MD   Signed by:   Khamiyah Grefe [place] MD on 02/08/2010   Method used:   Print then Give to Patient   RxID:   [id number] METOCLOPRAMIDE HCL 5 MG TABS (METOCLOPRAMIDE HCL) 1 by mouth before meals and qhs  #120 x 0   Entered and Authorized by:   Jonnette Nuon [place] MD   Signed by:   Mable Dara [place] MD on 02/08/2010   Method used:   Print then Give to Patient   RxID:    [id number] PROMETHAZINE HCL 25 MG TABS (PROMETHAZINE HCL) Take 1 by mouth every 6 hours as needed for nausea  #60 x 0   Entered and Authorized by:   Loretta Doutt [place] MD   Signed by:   Raymundo Rout [place] MD on 02/08/2010   Method used:   Print then Give to Patient   RxID:   [id number] PERCOCET 10-325 MG TABS (OXYCODONE-ACETAMINOPHEN) one tab three times a day  #90 x  0   Entered and Authorized by:   Detavious Rinn [place] MD   Signed by:   Zyair Russi [place] MD on 02/08/2010   Method used:   Print then Give to Patient   RxID:   [id number] MS CONTIN 15 MG XR12H-TAB (MORPHINE SULFATE) Take 1 by mouth two times a day for back pain.  #60 x 0   Entered and Authorized by:   Jamilette Suchocki [place] MD   Signed by:   Tatiyanna Lashley [place] MD on 02/08/2010   Method used:   Print then Give to Patient   RxID:   [id number]

## 2010-11-15 NOTE — Progress Notes (Signed)
Summary: promethazine refill  Phone Note Call from Patient Call back at Home Phone [phone]   Caller: Patient Summary of Call: wants to talk to nurse Initial call taken by: De Nurse,  December 07, 2009 10:11 AM  Follow-up for Phone Call        states drug store still does not have the phenergan. I called them & told them I had sent it 3 times. they never rec'd it. I gave the order verbally  pcp do you want to make this a larger amount since she takes every am? Follow-up by: Golden Circle RN,  December 07, 2009 10:13 AM  Additional Follow-up for Phone Call Additional follow up Details #1::        Will change refills to #30.  Done in comupter, so next time she needs refills, she can get 30 at a time Additional Follow-up by: Salvatore Shear [place] MD,  December 07, 2009 3:32 PM    Prescriptions: PROMETHAZINE HCL 25 MG TABS (PROMETHAZINE HCL) Take 1 by mouth every 6 hours as needed for nausea  #30 x 0   Entered and Authorized by:   Madelynn Malson [place] MD   Signed by:   Milicent Acheampong [place] MD on 12/07/2009   Method used:   Historical   RxID:   [id number]

## 2010-11-15 NOTE — Progress Notes (Signed)
Summary: torsemide refill  Phone Note Call from Patient Call back at Home Phone [phone]   Caller: Patient Summary of Call: states the drug store has tried to call to get refill on her fluid pills and now she is out. [facility] Initial call taken by: De Nurse,  October 18, 2009 11:55 AM  Follow-up for Phone Call        to pcp Follow-up by: Golden Circle RN,  October 18, 2009 11:58 AM    Prescriptions: TORSEMIDE 20 MG TABS (TORSEMIDE) 1 by mouth two times a day for fluid  #60 x 3   Entered and Authorized by:   Sarah [place] MD   Signed by:   Sarah [place] MD on 10/18/2009   Method used:   Faxed to ...       [facility] (retail)       [address]       [place], [place]  [address]       Ph: [phone]       Fax: [phone]   RxID:   [id number]
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
## 2010-11-15 NOTE — Miscellaneous (Signed)
Summary: pt d/c PT  Clinical Lists Changes rec'd fax that pt has stopped her PT.Golden Circle RN  April 04, 2010 4:33 PM
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
## 2010-11-15 NOTE — Assessment & Plan Note (Signed)
Summary: ear pain, dysuria/[place]   Vital Signs:  Patient profile:   70 year old female Temp:     98.3 degrees F oral Pulse rate:   72 / minute BP sitting:   118 / 62  (right arm) Cuff size:   large  Vitals Entered By: Loralee Pacas CMA (October 31, 2009 1:52 PM) Comments ear has been hurting since dx with vertigo, took meds but they didn't help. pt complaining that she has yeast infection they gave her a pill for it and she's still having some problems, feels like a relief when she urinates   Primary Care Provider:  Sarah Swaziland MD   History of Present Illness: 70 yo patient of Dr. Swaziland here for continued right ear pain and dysuria.  right ear pain:  patient says it has been hurting ever since she was diagnosed with vertigo.  Medications she prescribed did not help and she discontinued it.  Notices to change in hearing, no tinnitus, no worse with chewing or swallowing.  NO fever.  dysuria:  patient was seen 10/20/09 for dysuria and continues to have pain at baseline as well as with urination.  Pain is not located in abdomen or pelvis, but I think perhaps her vaginal/perineal area per patient.  Patient had normal urinalysis at the time and was prescribed 1 day of fluconazole.  No improvement in symptoms.  Unable to perform vaginal exam due to being wheelchair bound and we do not have the equipment to move her onto an exam table. Unable to see affected areas due to skin folds/ pannus.  Allergies: 1)  ! Amoxicillin 2)  ! Ibuprofen 3)  ! Haldol 4)  Naprosyn (Naproxen) 5)  Norvasc (Amlodipine Besylate) PMH-FH-SH reviewed for relevance  Review of Systems      See HPI General:  Denies chills, fever, and weakness. ENT:  Complains of earache; denies decreased hearing, difficulty swallowing, ear discharge, nasal congestion, postnasal drainage, ringing in ears, sinus pressure, and sore throat. GI:  Denies abdominal pain and change in bowel habits. GU:  Complains of dysuria; denies  abnormal vaginal bleeding, discharge, genital sores, urinary frequency, and urinary hesitancy.  Physical Exam  General:  Obese, WF sitting in wheelchair.  tardive dyskinesia and long chin facial hair noted. Head:  palpation of right side of lower face mildy tender to palpation from mastoid to chin.  No particular area of worse pain, no swelling, no erythema.  This may be what she is describing as ear pain rather than referrred pain.   Ears:  External ear exam shows no significant lesions or deformities.  Otoscopic examination reveals clear canals, tympanic membranes are intact bilaterally without bulging, retraction, inflammation or discharge. Hearing is grossly normal bilaterally. Mouth:  edentulous.  No oral lesions.  No popping, dislocation, or pain with jaw movements. Abdomen:  pelvis soft, nontender to palpation. Genitalia:  unable to see area that patient describes as painful due to bofy habitus and limitatiosn in mobility.   Impression & Recommendations:  Problem # 1:  EAR PAIN, RIGHT (ICD-388.70)  no evidence of otitis, mastoiditis, parotitis,  oral pain, or referred jaw pain.  Unclear to me if this is related to vertigo.  Given vague description and duration of only 1-2 weeks, discussed with patient monitoring ears for further symptoms.  Patient agreeable to making return visit with PCP for further workup if persists.  Orders: [facility]- Est Level  3 (04540)  Problem # 2:  DYSURIA (ICD-788.1)  Had recent U/A showing no evidence  of infection.  given patient's body habitus amd history of DM, most likely etiology of pain is candida.  Was given 1 day tx with fluconazole.  Will treat empirically with longer course today.  Precepted with Dr. Jennette Kettle who is familiar with this patient.  Orders: [facility]- Est Level  3 (13086)  Complete Medication List: 1)  Lantus 100 Unit/ml Soln (Insulin glargine) .... Inject 60 units subcutaneously once a day disp 3 months 2)  Prilosec Otc 20 Mg Tbec (Omeprazole  magnesium) .... Take 1 tablet by mouth every morning 3)  Relion R 100 Unit/ml Soln (Insulin regular human) .... Inject 15 unit subcutaneously three times a day 4)  Zocor 20 Mg Tabs (Simvastatin) .... Take 1 tablet by mouth every night 5)  Senior Multivitamin Plus Tabs (Multiple vitamins-minerals) .Marland Kitchen.. 1 by mouth qd 6)  Aspirin Adult Low Strength 81 Mg Tbec (Aspirin) .Marland Kitchen.. 1 by mouth once daily 7)  Lorazepam 0.5 Mg Tabs (Lorazepam) .... One two times a day as needed for anxiety 8)  Ms Contin 15 Mg Xr12h-tab (Morphine sulfate) .... Take 1 by mouth two times a day for back pain. 9)  Torsemide 20 Mg Tabs (Torsemide) .Marland Kitchen.. 1 by mouth two times a day for fluid 10)  Percocet 10-325 Mg Tabs (Oxycodone-acetaminophen) .... One tab three times a day 11)  Lisinopril 5 Mg Tabs (Lisinopril) .... One daily 12)  Metformin Hcl 500 Mg Xr24h-tab (Metformin hcl) .... One daily 13)  Nystatin 100000 Unit/gm Powd (Nystatin) .... Apply to affected area bid 14)  Iron 325 (65 Fe) Mg Tabs (Ferrous sulfate) .... Take 1 by mouth daily for low iron 15)  Fluconazole 150 Mg Tabs (Fluconazole) .... One tab every other day until complete. Prescriptions: FLUCONAZOLE 150 MG TABS (FLUCONAZOLE) one tab every other day until complete. Brand medically necessary #3 x 0   Entered and Authorized by:   Delbert Harness MD   Signed by:   Delbert Harness MD on 10/31/2009   Method used:   Faxed to ...       [facility] (retail)       [address]       [place], [place]  [address]       Ph: [phone]       Fax: [phone]   RxID:   [id number]

## 2010-11-15 NOTE — Progress Notes (Signed)
Summary: cxl  Phone Note Call from Patient   Caller: Patient Summary of Call: called to cxl appt w/ [name] today Initial call taken by: De Nurse,  July 23, 2010 9:19 AM

## 2010-11-15 NOTE — Miscellaneous (Signed)
Summary: increased prilosec  Clinical Lists Changes  Medications: Added new medication of PRILOSEC 40 MG CPDR (OMEPRAZOLE) 1 by mouth Daily for stomach pain Removed medication of PRILOSEC OTC 20 MG TBEC (OMEPRAZOLE MAGNESIUM) Take 1 tablet by mouth every morning  Appended Document: increased prilosec pt called to ask if Prilosec was called in for 2xdaily?

## 2010-11-15 NOTE — Assessment & Plan Note (Signed)
Summary: Diabetes - Med Review - Rx Clinic   Vital Signs:  Patient profile:   70 year old female Pulse rate:   70 / minute BP sitting:   104 / 52  (left arm)  Primary Care Provider:  Sarah Swaziland MD   History of Present Illness: Holly Kim is a 70 yo morbidly obese female with a long-standing hx of T2DM with gastroparesis and other complications presenting today for evaluation of DM management/control.  She has been 100% compliant with insulin regimen since seeing Dr. Swaziland 11/23/09.  She now has her insulin shots and meter left beside her during the day after her nurse leaves in the morning so that she can use her insulin properly.  She has not experienced any lows.  Her fasting glucose is mostly in the 100's, pre-lunch in the 200's, and pre-dinner in the 100-200's.    Chief complaint today is RLQ abdominal pain that is not relieved by BM (or anything else).  Food does not increase the pain, however her intake has been reduced due to pain.  Increased use of promethazine the last week due to nausea without vomiting.  She has been using MOM daily after stopping Senokot.  Miralax made her "too sick" when she tried this.  If she doesn't "go more than once a day", her stools are "too hard to pass".     Current Medications (verified): 1)  Lantus 100 Unit/ml Soln (Insulin Glargine) .... Inject 55 Units Subcutaneously Once A Day Disp 3 Months 2)  Prilosec Otc 20 Mg Tbec (Omeprazole Magnesium) .... Take 1 Tablet By Mouth Every Morning 3)  Relion R 100 Unit/ml Soln (Insulin Regular Human) .... Inject 17 Units Subcutaneously Three Times A Day Before Meals 4)  Zocor 20 Mg Tabs (Simvastatin) .... Take 1 Tablet By Mouth Every Night 5)  Senior Multivitamin Plus  Tabs (Multiple Vitamins-Minerals) .Marland Kitchen.. 1 By Mouth Qd 6)  Aspirin Adult Low Strength 81 Mg Tbec (Aspirin) .Marland Kitchen.. 1 By Mouth Once Daily 7)  Lorazepam 0.5 Mg Tabs (Lorazepam) .... One Two Times A Day As Needed For Anxiety 8)  Ms Contin 15 Mg  Xr12h-Tab (Morphine Sulfate) .... Take 1 By Mouth Two Times A Day For Back Pain. 9)  Torsemide 20 Mg Tabs (Torsemide) .Marland Kitchen.. 1 By Mouth Two Times A Day For Fluid 10)  Percocet 10-325 Mg Tabs (Oxycodone-Acetaminophen) .... One Tab Three Times A Day 11)  Lisinopril 5 Mg Tabs (Lisinopril) .... One Daily 12)  Nystatin 100000 Unit/gm Powd (Nystatin) .... Apply To Affected Area Bid 13)  Iron 325 (65 Fe) Mg Tabs (Ferrous Sulfate) .... Take 1 By Mouth Daily For Low Iron 14)  Fluconazole 150 Mg Tabs (Fluconazole) .... One Tab Every Other Day Until Complete. 15)  Promethazine Hcl 25 Mg Tabs (Promethazine Hcl) .... Take 1 By Mouth Every 6 Hours As Needed For Nausea 16)  Senokot 8.6 Mg Tabs (Sennosides) .... 3 Tablets By Mouth Daily For Constipation  Allergies (verified): 1)  ! Amoxicillin 2)  ! Ibuprofen 3)  ! Haldol 4)  Naprosyn (Naproxen) 5)  Norvasc (Amlodipine Besylate)   Impression & Recommendations:  Problem # 1:  DIABETES MELLITUS, II, COMPLICATIONS (ICD-250.92) Assessment Unchanged  DM control has improved by consistently taking her lunch Relion R dose.  Last A1c in 10/10 was 6.2% (Goal <7%).  Goal is to achieve glucose readings in the 100's consistently throughout the day.  DC metformin since it is unlikely low dose is benefiting glucose control and GI complications limit dose  titration.  Since pre-lunch and pre-dinner glucose readings remain mostly in the 200's, increase Relion R to 17 units three times a day before meals and decrease Lantus to 55 units at bedtime.  This will achieve approximately a 50/50 meal-time/basal insulin ratio.  Relion R must be titrated to same dose at all meals because nurse draws up insulin for her, so patient can't tell the difference in doses due to visual impairment.  Continue glucose monitoring 3-4x daily.  TTFC:  50 minutesFU with Rx Clinic in 2 weeks.  Patient seen with:  Holly Kim, PharmD  The following medications were removed from the medication list     Metformin Hcl 500 Mg Xr24h-tab (Metformin hcl) ..... One daily Her updated medication list for this problem includes:    Lantus 100 Unit/ml Soln (Insulin glargine) ..... Inject 55 units subcutaneously once a day disp 3 months    Relion R 100 Unit/ml Soln (Insulin regular human) ..... Inject 17 units subcutaneously three times a day before meals    Aspirin Adult Low Strength 81 Mg Tbec (Aspirin) .Marland Kitchen... 1 by mouth once daily    Lisinopril 5 Mg Tabs (Lisinopril) ..... One daily  Orders: Reassessment Each 15 min unit- [facility] (04540)  Problem # 2:  CONSTIPATION (ICD-564.00) Assessment: Deteriorated Abdominal pain, constipation, and nausea likely due to gastroparesis.  Dr. Swaziland briefly examined patient and ordered abdominal x-ray and then possibly a CT scan to rule out any unknown acute or chronic problems.  MOM is not a favorable option for daily use and will be DC.    Patient states Senokot worked well for her, so restart this medication at previous dose for now.  If pain worsens, will FU with Dr. Swaziland.  FU with Dr. Swaziland after abdominal x-ray.  Patient seen with:  Holly Kim PharmD, Dr. Sarah Swaziland.  Her updated medication list for this problem includes:    Senokot 8.6 Mg Tabs (Sennosides) .Marland KitchenMarland KitchenMarland KitchenMarland Kitchen 3 tablets by mouth daily for constipation  Complete Medication List: 1)  Lantus 100 Unit/ml Soln (Insulin glargine) .... Inject 55 units subcutaneously once a day disp 3 months 2)  Prilosec Otc 20 Mg Tbec (Omeprazole magnesium) .... Take 1 tablet by mouth every morning 3)  Relion R 100 Unit/ml Soln (Insulin regular human) .... Inject 17 units subcutaneously three times a day before meals 4)  Zocor 20 Mg Tabs (Simvastatin) .... Take 1 tablet by mouth every night 5)  Senior Multivitamin Plus Tabs (Multiple vitamins-minerals) .Marland Kitchen.. 1 by mouth qd 6)  Aspirin Adult Low Strength 81 Mg Tbec (Aspirin) .Marland Kitchen.. 1 by mouth once daily 7)  Lorazepam 0.5 Mg Tabs (Lorazepam) .... One two times a day as needed for  anxiety 8)  Ms Contin 15 Mg Xr12h-tab (Morphine sulfate) .... Take 1 by mouth two times a day for back pain. 9)  Torsemide 20 Mg Tabs (Torsemide) .Marland Kitchen.. 1 by mouth two times a day for fluid 10)  Percocet 10-325 Mg Tabs (Oxycodone-acetaminophen) .... One tab three times a day 11)  Lisinopril 5 Mg Tabs (Lisinopril) .... One daily 12)  Nystatin 100000 Unit/gm Powd (Nystatin) .... Apply to affected area bid 13)  Iron 325 (65 Fe) Mg Tabs (Ferrous sulfate) .... Take 1 by mouth daily for low iron 14)  Fluconazole 150 Mg Tabs (Fluconazole) .... One tab every other day until complete. 15)  Promethazine Hcl 25 Mg Tabs (Promethazine hcl) .... Take 1 by mouth every 6 hours as needed for nausea 16)  Senokot 8.6 Mg Tabs (Sennosides) .Marland KitchenMarland KitchenMarland Kitchen  3 tablets by mouth daily for constipation  Other Orders: Comp Met-[facility] ([address]40-98119) CBC-[facility] (14782) Radiology other (Radiology Other)  Patient Instructions: 1)  Stop taking Metformin. 2)  Decrease Lantus to 55 units at bedtime. 3)  Increase Relion R insulin to 17 units before breakfast, lunch, and dinner. 4)  Stop Milk of Magnesia.  5)  Start Senokot 3 tablets daily. 6)  Continue checking blood sugar 3 times daily. 7)  Bring glucose logs to each visit. 8)  Make appointment to see Pharmacy Clinic in 2 weeks. 9)  Abdominal x-ray per Dr. Elvis Coil order. 10)  Try reading or listening to books for fun! Prescriptions: SENOKOT 8.6 MG TABS (SENNOSIDES) 3 tablets by mouth daily for constipation  #1 x 0   Entered and Authorized by:   Christian Mate D   Signed by:   Madelon Lips Pharm D on 11/28/2009   Method used:   Print then Give to Patient   RxID:   [id number] RELION R 100 UNIT/ML SOLN (INSULIN REGULAR HUMAN) Inject 17 units subcutaneously three times a day before meals  #1 x 0   Entered and Authorized by:   Christian Mate D   Signed by:   Madelon Lips Pharm D on 11/28/2009   Method used:   Print then Give to Patient   RxID:   [id number] LANTUS 100  UNIT/ML SOLN (INSULIN GLARGINE) Inject 55 units subcutaneously once a day disp 3 months  #1 x 0   Entered and Authorized by:   Christian Mate D   Signed by:   Madelon Lips Pharm D on 11/28/2009   Method used:   Print then Give to Patient   RxID:   [id number]   Prevention & Chronic Care Immunizations   Influenza vaccine: Fluvax MCR  (07/21/2009)   Influenza vaccine due: 07/06/2009    Tetanus booster: 08/17/2008: given   Tetanus booster due: 08/17/2018    Pneumococcal vaccine: Pneumovax  (07/29/2007)   Pneumococcal vaccine due: None    H. zoster vaccine: 11/03/2008: Zostavax  Colorectal Screening   Hemoccult: refused  (03/09/2008)   Hemoccult due: 06/02/2010    Colonoscopy: refused  (01/20/2008)   Colonoscopy due: 01/19/2018  Other Screening   Pap smear: not indicated  (03/02/2009)   Pap smear due: Not Indicated    Mammogram: refused  (03/02/2009)   Mammogram due: 03/02/2010    DXA bone density scan: Not documented   Smoking status: never  (10/20/2009)  Diabetes Mellitus   HgbA1C: 6.2   (07/21/2009)   Hemoglobin A1C due: 10/20/2008    Eye exam: dilated eye exam [facility] next due 6 m s/p cataract extraction OD 06/2007  (01/26/2008)   Eye exam due: 01/25/2009    Foot exam: yes  (08/08/2009)   Foot exam action/deferral: Do today   High risk foot: Yes  (08/08/2009)   Foot care education: Done  (08/08/2009)   Foot exam due: 11/10/2009    Urine microalbumin/creatinine ratio: Not documented    Diabetes flowsheet reviewed?: Yes   Progress toward A1C goal: At goal  Lipids   Total Cholesterol: 100  (04/25/2009)   LDL: 47  (04/25/2009)   LDL Direct: 64  (12/23/2007)   HDL: 33  (04/25/2009)   Triglycerides: 101  (04/25/2009)    SGOT (AST): 12  (04/25/2009)   SGPT (ALT): 12  (04/25/2009) CMP ordered    Alkaline phosphatase: 62  (04/25/2009)   Total bilirubin: 0.4  (04/25/2009)    Lipid flowsheet reviewed?: Yes  Progress toward LDL goal: At  goal  Hypertension   Last Blood Pressure: 104 / 52  (11/28/2009)   Serum creatinine: 0.96  (09/06/2009)   Serum potassium 4.6  (09/06/2009) CMP ordered     Hypertension flowsheet reviewed?: Yes   Progress toward BP goal: At goal  Self-Management Support :   Personal Goals (by the next clinic visit) :     Personal A1C goal: 7  (07/03/2009)     Personal blood pressure goal: 130/80  (07/03/2009)     Personal LDL goal: 70  (07/03/2009)    Diabetes self-management support: Not documented    Diabetes self-management support not done because: Good outcomes  (11/28/2009)    Hypertension self-management support: Not documented    Hypertension self-management support not done because: Good outcomes  (11/28/2009)    Lipid self-management support: Not documented     Lipid self-management support not done because: Good outcomes  (11/28/2009)   Appended Document: Diabetes - Med Review - Rx Clinic I examined and talked with Ms Staszewski as well.  She complains of R sided abdominal pain.  No relief with the bowel movements she is having.  She c/o diarrhea last night after taking MOM.  + nausea, no vomiting. Exam very limited due to body habitus, but she is TTP upper and lower quadrants on R.  No mass appreciated, but again, body habitus severely limits exam.  Exam done with pt in wheelchair as we do not have facilities to get pt to exam table.  + BS.   Will check abdominal series to eval for continued constipation. If no cause of pain revealed on films, will consider CT scan. Recheck CBC today and recheck creatinine.  Pt to contact us if she worsens.

## 2010-11-15 NOTE — Progress Notes (Signed)
Summary: pls call  Phone Note Call from Patient Call back at Home Phone [phone]   Caller: Patient Call For: q Summary of Call: needs to talk to [facility] Initial call taken by: De Nurse,  December 06, 2009 9:59 AM  Follow-up for Phone Call        out of phenergan Follow-up by: Golden Circle RN,  December 06, 2009 10:02 AM    Prescriptions: PROMETHAZINE HCL 25 MG TABS (PROMETHAZINE HCL) Take 1 by mouth every 6 hours as needed for nausea  #20 x 0   Entered by:   Golden Circle RN   Authorized by:   Zenora Karpel Swaziland MD   Signed by:   Golden Circle RN on 12/06/2009   Method used:   Printed then faxed to ...       [facility] (retail)       [address]       [place], [place]  [address]       Ph: [phone]       Fax: [phone]   RxID:   [id number]  states she takes one every am since she is very nauseous when she wakes up. I refilled #20 & will send to pcp to see if she wants to order a larger amount based on once daily use by pt. pt states the pharmacy will not deliver the rest of her meds until they get this one refilled.Golden Circle RN  December 06, 2009 10:05 AM

## 2010-11-15 NOTE — Assessment & Plan Note (Signed)
Summary: to see sykes at 12/kh   Vital Signs:  Patient profile:   70 year old female Height:      66 inches  Vitals Entered By: Holly Almas PHD (August 27, 2010 11:56 AM)  Primary Care Provider:  Sarah Swaziland MD   History of Present Illness: Assessment:  Spent 30 min with pt.  For breakfast Holly Kim is usually having oatmeal and fruit or bagel with cream cheese or eggs and grits; lunch is usually a roll w/ cream cheese & 2 tangerines or Jell-O; dinner is usually a meat, starch, and veg.  Her son Holly Kim has been cooking consistently recently, so she has been getting veg's daily, and she said her appetite has been less, so she's eating less.  This is reflected in her BG values.  FBG in the past 16 days ranged from 92-279, with 10 being under 200, an improvement since I saw her last.  [name] said she had stopped doing her daily chair exercises b/c she just felt too bad, but she feels ready to restart.  Current concern is that she is required to move Dec 5 b/c, according to Holly Kim, the landlady does not like her son Holly Kim.  She is not yet sure where they will go.    Nutrition Diagnosis:  Regression in physical inactivity (NB-2.1) related to disability as evidenced by discontinuation of wheelchair exercises.  Continued progress on imbalance of nutrients (NI5.5) related to carbohydrates as evidenced by recent weight loss and better glycemic control.    Intervention:  See Patient Instructions.   Monitoring/Eval:  Dietary intake and exercise at F/U in January.     Allergies: 1)  ! Amoxicillin 2)  ! Ibuprofen 3)  ! Haldol 4)  Naprosyn (Naproxen) 5)  Norvasc (Amlodipine Besylate)   Complete Medication List: 1)  Lantus 100 Unit/ml Soln (Insulin glargine) .... Inject 55 units subcutaneously once a day disp 3 months 2)  Relion R 100 Unit/ml Soln (Insulin regular human) .... Inject 18  units subcutaneously three times a day before meals.  dispense quantity sufficient for one month  supply. 3)  Zocor 20 Mg Tabs (Simvastatin) .... Take 1 tablet by mouth every night 4)  Senior Multivitamin Plus Tabs (Multiple vitamins-minerals) .Marland Kitchen.. 1 by mouth qd 5)  Aspirin Adult Low Strength 81 Mg Tbec (Aspirin) .Marland Kitchen.. 1 by mouth once daily 6)  Lorazepam 0.5 Mg Tabs (Lorazepam) .... One two times a day as needed for anxiety 7)  Ms Contin 15 Mg Xr12h-tab (Morphine sulfate) .... Take 1 by mouth two times a day for back pain.  do not fill until 08/05/10 8)  Torsemide 20 Mg Tabs (Torsemide) .Marland Kitchen.. 1 by mouth two times a day for fluid 9)  Percocet 10-325 Mg Tabs (Oxycodone-acetaminophen) .... One tab every 6 hours as needed pain.  do not fill until 08/05/10. 10)  Lisinopril 5 Mg Tabs (Lisinopril) .... One daily 11)  Nystatin 100000 Unit/gm Powd (Nystatin) .... Apply to affected area bid 12)  Iron 325 (65 Fe) Mg Tabs (Ferrous sulfate) .... Take 1 by mouth daily for low iron 13)  Promethazine Hcl 25 Mg Tabs (Promethazine hcl) .... Take 1 by mouth every 6 hours as needed for nausea 14)  Senokot 8.6 Mg Tabs (Sennosides) .... 3 tablets by mouth daily for constipation 15)  Sorbitol 70 % Soln (Sorbitol) .... Take 2 tbsp every 2 hours until bm, then take 2 tbsp daily-qs 16)  Erythromycin Base 250 Mg Tabs (Erythromycin base) .... Take 1 by mouth 30 minutes  before meals 17)  Metoclopramide Hcl 5 Mg Tabs (Metoclopramide hcl) .Marland Kitchen.. 1 by mouth before meals and qhs 18)  Fluconazole 150 Mg Tabs (Fluconazole) .Marland Kitchen.. 1 by mouth once 19)  Nystatin 100000 Unit/gm Powd (Nystatin) .... Apply to affected area bid 20)  Nystatin 100000 Unit/gm Crea (Nystatin) .... Apply to affected area bid 21)  Carafate 1 Gm Tabs (Sucralfate) .Marland Kitchen.. 1 by mouth three times a day before meals 22)  Dexilant 60 Mg Cpdr (Dexlansoprazole) .Marland Kitchen.. 1 by mouth daily 23)  Plavix 75 Mg Tabs (Clopidogrel bisulfate) .Marland Kitchen.. 1 by mouth daily 24)  Crestor 40 Mg Tabs (Rosuvastatin calcium) .Marland Kitchen.. 1 by mouth q hs for cholesterol 25)  Ranitidine Hcl 150 Mg Caps  (Ranitidine hcl) .Marland Kitchen.. 1 by mouth two times a day for stomach.  [facility]  Patient Instructions: 1)  Continue to veg vegetables at least once a day.  2)  Continue to limit sweets.  3)  Chair exercises beginning today, and every day!   4)  Follow-up nutrition appt Monday, January 9 at noon.     Orders Added: 1)  Reassessment Each 15 min unit- [facility] [[id number]]

## 2010-11-15 NOTE — Assessment & Plan Note (Signed)
Summary: continued abd pain.  requesting more percocets   Primary Care Provider:  Sarah Swaziland MD   History of Present Illness: Pt reports she is not controlled on current pain regimen.  Taking 2 morphines every morning and every evening, but still needs extra percocets.  States she is taking 1 percocet at night, some nights she has antoher one in the middle of the night.  Pt reports she has very few of the 10 mg percocets left, and that I wrote her prescription for the 5 mg percocets incorrectly because I only gave her 15.  She states she doesn't understand why I won't give her more because her other family members get 120 percocets a month.   Still no vomiting.  No fevers or chills.  We added up the number of percocets that she should have left.  Even if she is taking 4 a day, she should still have 14 pills left.  I showed her these numbers.    Social situation still difficult.  Has not found another place.  Is appealing her conviction legally.  Will have 30 days at least in her apt.      Cardiovascular Risk History:      Positive major cardiovascular risk factors include female age 64 years old or older, diabetes, hyperlipidemia, and hypertension.  Negative major cardiovascular risk factors include non-tobacco-user status.        Positive history for target organ damage include ASHD (either angina; prior MI; prior CABG).    Allergies: 1)  ! Amoxicillin 2)  ! Ibuprofen 3)  ! Haldol 4)  Naprosyn (Naproxen) 5)  Norvasc (Amlodipine Besylate)  Review of Systems       see HPI  Physical Exam  General:  Obese, in wheelchair.  No acute distress.  Cooperative with exam, but  irritated that I will not prescribe extra percocets.  Vitals noted. Abdomen:  Exam severely limited by body habitus.  + continued erythema and small ulcer under R breast.  Very TTP, even very light palpation RUQ.  No mass or HSM appreciated, but exam is very limited. Psych:  Appropriate grooming and dress.  No FOI or  LOA.  Thought process normal, but content focused on obtaining more pain medicines, specifically more percocets.  Slightly depressed appearing at times, but no more so than previous visits.     Impression & Recommendations:  Problem # 1:  ABDOMINAL PAIN, RIGHT UPPER QUADRANT (ICD-789.01)  Unclear etiology of her pain.  Consider gall bladder (nuc med scan ordered) or nerve entrapment.  Also concerning is Ms. Churchman's focus on the percoects.  I have converted her short acting meds into her long acting meds, so her total dose of narcotic should be the same as when she was taking 40 mg of percocets a day.  However, she reports that she is still needing more percocets, and she is running out before I would expect that, even if she is taking 4 day.  We reviewed the problems with taking such high doses of narcotics.   I am very concerned that Ms. Lemus's meds are being diverted.  She is in a very difficult social situation, and it sounds like she knows exactly how many percocets her family members get, and she is running out early.  Her son is a substance abuser (known alcohol).  She is struggling financially.   In order to control her pain, I will continue the long acting meds (morphine) at current dose.  I will not give any additional  short acting meds.  She should have plenty of these left, and she still has the rx for the 15 of the 5/325 that I gave her 2 days ago.   Pt to follow up in 2 weeks, or sooner as needed. Her updated medication list for this problem includes:    Metoclopramide Hcl 5 Mg Tabs (Metoclopramide hcl) .Marland Kitchen... 1 by mouth before meals and qhs  Orders: [facility]- Est Level  3 (40981)  Complete Medication List: 1)  Lantus 100 Unit/ml Soln (Insulin glargine) .... Inject 55 units subcutaneously once a day disp 3 months 2)  Relion R 100 Unit/ml Soln (Insulin regular human) .... Inject 18  units subcutaneously three times a day before meals.  dispense quantity sufficient for one month  supply. 3)  Zocor 20 Mg Tabs (Simvastatin) .... Take 1 tablet by mouth every night 4)  Senior Multivitamin Plus Tabs (Multiple vitamins-minerals) .Marland Kitchen.. 1 by mouth qd 5)  Aspirin Adult Low Strength 81 Mg Tbec (Aspirin) .Marland Kitchen.. 1 by mouth once daily 6)  Lorazepam 0.5 Mg Tabs (Lorazepam) .... One two times a day as needed for anxiety 7)  Torsemide 20 Mg Tabs (Torsemide) .Marland Kitchen.. 1 by mouth two times a day for fluid 8)  Lisinopril 5 Mg Tabs (Lisinopril) .... One daily 9)  Nystatin 100000 Unit/gm Powd (Nystatin) .... Apply to affected area bid 10)  Iron 325 (65 Fe) Mg Tabs (Ferrous sulfate) .... Take 1 by mouth daily for low iron 11)  Promethazine Hcl 25 Mg Tabs (Promethazine hcl) .... Take 1 by mouth every 6 hours as needed for nausea 12)  Senokot 8.6 Mg Tabs (Sennosides) .... 3 tablets by mouth daily for constipation 13)  Sorbitol 70 % Soln (Sorbitol) .... Take 2 tbsp every 2 hours until bm, then take 2 tbsp daily-qs 14)  Erythromycin Base 250 Mg Tabs (Erythromycin base) .... Take 1 by mouth 30 minutes before meals 15)  Metoclopramide Hcl 5 Mg Tabs (Metoclopramide hcl) .Marland Kitchen.. 1 by mouth before meals and qhs 16)  Fluconazole 150 Mg Tabs (Fluconazole) .Marland Kitchen.. 1 by mouth once 17)  Nystatin 100000 Unit/gm Powd (Nystatin) .... Apply to affected area bid 18)  Nystatin 100000 Unit/gm Crea (Nystatin) .... Apply to affected area bid 19)  Carafate 1 Gm Tabs (Sucralfate) .Marland Kitchen.. 1 by mouth three times a day before meals 20)  Dexilant 60 Mg Cpdr (Dexlansoprazole) .Marland Kitchen.. 1 by mouth daily 21)  Plavix 75 Mg Tabs (Clopidogrel bisulfate) .Marland Kitchen.. 1 by mouth daily 22)  Crestor 40 Mg Tabs (Rosuvastatin calcium) .Marland Kitchen.. 1 by mouth q hs for cholesterol 23)  Ranitidine Hcl 150 Mg Caps (Ranitidine hcl) .Marland Kitchen.. 1 by mouth two times a day for stomach. 24)  Ms Contin 30 Mg Xr12h-tab (Morphine sulfate) .... Take 1 tablet twice daily.  start these after you run out of the other morphine tablets. 25)  Percocet 5-325 Mg Tabs (Oxycodone-acetaminophen)  .... Take 1 two times a day if needed for severe pain  Cardiovascular Risk Assessment/Plan:      The patient's hypertensive risk group is category C: Target organ damage and/or diabetes.     Orders Added: 1)  [facility]- Est Level  3 [19147]
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
## 2010-11-15 NOTE — Progress Notes (Signed)
Summary: [facility]- Nuclear Scan  Phone Note Other Incoming   Caller: Duke Salvia at [facility] [phone] Summary of Call: Calling to inform Dr. Swaziland that pt did not keep her appt scheduled on 10/05/10 for  her Nuclear Med. Scan. Initial call taken by: Terese Door,  October 09, 2010 2:09 PM

## 2010-11-15 NOTE — Miscellaneous (Signed)
Summary: Ct order  Clinical Lists Changes  Orders: Added new Test order of CT with & without Contrast (CT w&w/o Contrast) - Signed

## 2010-11-15 NOTE — Assessment & Plan Note (Signed)
Summary: F/U VISIT/[facility]   Vital Signs:  Patient profile:   70 year old female Height:      66 inches Weight:      290.3 pounds BMI:     47.03 Temp:     98.5 degrees F oral Pulse rate:   86 / minute BP sitting:   98 / 65  (left arm) Cuff size:   large  Vitals Entered By: Garen Grams LPN (June 06, 2010 11:31 AM) CC: f/u Is Patient Diabetic? Yes Did you bring your meter with you today? No Pain Assessment Patient in pain? yes     Location: right side Intensity: 9   Primary Care Provider:  Sarah Swaziland MD  CC:  f/u.  History of Present Illness: 70 yo female here for follow up. Reorts her glucose is out of control. Taking meds appropriately per pt..  Glucose is 190s-300s.    Hit her leg on her bed the other day.  Was swollen like a grapefruit.  Now better, but still sore.  Pt reports that she is having trouble breathing without her oxygen when she transfers to potty chair. No chest pain.    For fun, listens to books on tape.    Nurse is taking care of decubitus.    Denies melena.    Habits & Providers  Alcohol-Tobacco-Diet     Tobacco Status: never  Allergies: 1)  ! Amoxicillin 2)  ! Ibuprofen 3)  ! Haldol 4)  Naprosyn (Naproxen) 5)  Norvasc (Amlodipine Besylate)  Review of Systems       see HPI  Physical Exam  General:  Obese, wheelchair bound,in no acute distress; alert,appropriate and cooperative throughout examination.  Intermittently appears short of breath. Lungs:  Decreased respiratory effort, chest expands symmetrically. Lungs with mild basilar crackles. Heart:  Distant sounds.  Normal rate and regular rhythm. S1 and S2 normal without gallop, murmur, click, rub or other extra sounds. Extremities:  Mild swelling and moderate TTP RLE ant shin.  No abrasions.   Impression & Recommendations:  Problem # 1:  DECUBITUS ULCER, BUTTOCK (ICD-707.05) Followed by home health RN  Problem # 2:  ABDOMINAL PAIN, GENERALIZED (ICD-789.07) No further  interventions at this point.  Continue to follow Her updated medication list for this problem includes:    Metoclopramide Hcl 5 Mg Tabs (Metoclopramide hcl) .Marland Kitchen... 1 by mouth before meals and qhs  Problem # 3:  SHORTNESS OF BREATH (ICD-786.05) Chest X-ray recently with same symptoms without concerning findings.  Check labs.  Anemia may be contributing factor. 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  Continue to follow.  May be from muffins she is eating now. Her updated medication list for this problem includes:    Lantus 100 Unit/ml Soln (Insulin glargine) ..... Inject 55 units subcutaneously once a day disp 3 months    Relion R 100 Unit/ml Soln (Insulin regular human) ..... Inject 18  units subcutaneously three times a day before meals.  dispense quantity sufficient for one month supply.    Aspirin Adult Low Strength 81 Mg Tbec (Aspirin) .Marland Kitchen... 1 by mouth once daily    Lisinopril 5 Mg Tabs (Lisinopril) ..... One daily  Orders: [facility]- Est Level  3 ([id number])  Problem # 5:  BACK PAIN, CHRONIC (ICD-724.5) Refill meds today. Her updated medication list for this problem includes:    Aspirin Adult Low Strength 81 Mg Tbec (Aspirin) .Marland KitchenMarland KitchenMarland KitchenMarland Kitchen  1 by mouth once daily    Ms Contin 15 Mg Xr12h-tab (Morphine sulfate) .Marland Kitchen... Take 1 by mouth two times a day for back pain.    Percocet 10-325 Mg Tabs (Oxycodone-acetaminophen) ..... One tab three times a day  Orders: [facility]- Est Level  3 ([id number])  Complete Medication List: 1)  Lantus 100 Unit/ml Soln (Insulin glargine) .... Inject 55 units subcutaneously once a day disp 3 months 2)  Relion R 100 Unit/ml Soln (Insulin regular human) .... Inject 18  units subcutaneously three times a day before meals.  dispense quantity sufficient for one month supply. 3)  Zocor 20 Mg Tabs (Simvastatin) .... Take 1 tablet by mouth every night 4)  Senior  Multivitamin Plus Tabs (Multiple vitamins-minerals) .Marland Kitchen.. 1 by mouth qd 5)  Aspirin Adult Low Strength 81 Mg Tbec (Aspirin) .Marland Kitchen.. 1 by mouth once daily 6)  Lorazepam 0.5 Mg Tabs (Lorazepam) .... One two times a day as needed for anxiety 7)  Ms Contin 15 Mg Xr12h-tab (Morphine sulfate) .... Take 1 by mouth two times a day for back pain. 8)  Torsemide 20 Mg Tabs (Torsemide) .Marland Kitchen.. 1 by mouth two times a day for fluid 9)  Percocet 10-325 Mg Tabs (Oxycodone-acetaminophen) .... One tab three times a day 10)  Lisinopril 5 Mg Tabs (Lisinopril) .... One daily 11)  Nystatin 100000 Unit/gm Powd (Nystatin) .... Apply to affected area bid 12)  Iron 325 (65 Fe) Mg Tabs (Ferrous sulfate) .... Take 1 by mouth daily for low iron 13)  Promethazine Hcl 25 Mg Tabs (Promethazine hcl) .... Take 1 by mouth every 6 hours as needed for nausea 14)  Senokot 8.6 Mg Tabs (Sennosides) .... 3 tablets by mouth daily for constipation 15)  Sorbitol 70 % Soln (Sorbitol) .... Take 2 tbsp every 2 hours until bm, then take 2 tbsp daily-qs 16)  Erythromycin Base 250 Mg Tabs (Erythromycin base) .... Take 1 by mouth 30 minutes before meals 17)  Pyridium 200 Mg Tabs (Phenazopyridine hcl) .... Take 1 by mouth 3 times a day for 2 days. 18)  Ciprofloxacin Hcl 250 Mg Tabs (Ciprofloxacin hcl) .Marland Kitchen.. 1 by mouth two times a day for urine infection. 19)  Omeprazole 20 Mg Tbec (Omeprazole) .Marland Kitchen.. 1 by mouth two times a day for stomach pain. 20)  Metoclopramide Hcl 5 Mg Tabs (Metoclopramide hcl) .Marland Kitchen.. 1 by mouth before meals and qhs 21)  Fluconazole 150 Mg Tabs (Fluconazole) .Marland Kitchen.. 1 by mouth once 22)  Nystatin 100000 Unit/gm Powd (Nystatin) .... Apply to affected area bid 23)  Nystatin 100000 Unit/gm Crea (Nystatin) .... Apply to affected area bid 24)  Carafate 1 Gm Tabs (Sucralfate) .Marland Kitchen.. 1 by mouth three times a day before meals 25)  Dexilant 60 Mg Cpdr (Dexlansoprazole) .Marland Kitchen.. 1 by mouth daily  Patient Instructions: 1)  You can try over the counter  miconazole or clotrimazole for yeast infections.   Prescriptions: MS CONTIN 15 MG XR12H-TAB (MORPHINE SULFATE) Take 1 by mouth two times a day for back pain.  #60 x 0   Entered and Authorized by:   Sarah Swaziland MD   Signed by:   Sarah Swaziland MD on 06/06/2010   Method used:   Print then Give to Patient   RxID:   [id number] PERCOCET 10-325 MG TABS (OXYCODONE-ACETAMINOPHEN) one tab three times a day  #90 x 0   Entered and Authorized by:   Sarah Swaziland MD   Signed by:   Sarah Swaziland MD on 06/06/2010   Method used:  Print then Give to Patient   RxID:   [id number]

## 2010-11-15 NOTE — Assessment & Plan Note (Signed)
Summary: F/U/KH   Vital Signs:  Patient profile:   70 year old female Height:      66 inches  Vitals Entered By: Wyona Almas PHD (April 02, 2010 11:53 AM)  Primary Care Provider:  Sarah Swaziland MD   History of Present Illness: Assessment:  Spent 30 minutes with pt.  Candiss again said she is having a lot of abdominal pain.  She said pain was especially severe a couple days ago when she ate 3 slices of tomato.  (Her daughter Zella Ball told her tomatoes would be problematic if she has gall bladder problems.)  Fasting blood glucose in the past 30 days has been 125-274, with half below 200 and half above 200.  This morning's glucose was 251, and I pointed out to Presquille that last night's dinner provided 4-5 starches between 1 c black-eyed peas, 1 c hamburger helper, and 1/2 c sweet potato.  She said she just eats what her son [name] provides.  By comparison to Skila's last visit, it seems her appetite has increased.  We also talked about lunches, which recently are usually fruit and little else.  She is not fond of sandwiches, and is tired of yogurt, and her son won't usually buy cottage cheese, which is a protein food she likes.    Nutrition Diagnosis:  No changes in physical inactivity (NB-2.1) related to disability as evidenced by wheelchair requirement.  No progress on imbalance of nutrients (NI5.5) related to carbohydrates as evidenced by dinner containing 4-5 starches vs. recommended 2.       Intervention:  See Patient Instructions.   Monitoring/Eval: Dietary intake and exercise at F/U in 4 wk.    Allergies: 1)  ! Amoxicillin 2)  ! Ibuprofen 3)  ! Haldol 4)  Naprosyn (Naproxen) 5)  Norvasc (Amlodipine Besylate)   Complete Medication List: 1)  Lantus 100 Unit/ml Soln (Insulin glargine) .... Inject 55 units subcutaneously once a day disp 3 months 2)  Relion R 100 Unit/ml Soln (Insulin regular human) .... Inject 18  units subcutaneously three times a day before meals.  dispense  quantity sufficient for one month supply. 3)  Zocor 20 Mg Tabs (Simvastatin) .... Take 1 tablet by mouth every night 4)  Senior Multivitamin Plus Tabs (Multiple vitamins-minerals) .Marland Kitchen.. 1 by mouth qd 5)  Aspirin Adult Low Strength 81 Mg Tbec (Aspirin) .Marland Kitchen.. 1 by mouth once daily 6)  Lorazepam 0.5 Mg Tabs (Lorazepam) .... One two times a day as needed for anxiety 7)  Ms Contin 15 Mg Xr12h-tab (Morphine sulfate) .... Take 1 by mouth two times a day for back pain. 8)  Torsemide 20 Mg Tabs (Torsemide) .Marland Kitchen.. 1 by mouth two times a day for fluid 9)  Percocet 10-325 Mg Tabs (Oxycodone-acetaminophen) .... One tab three times a day 10)  Lisinopril 5 Mg Tabs (Lisinopril) .... One daily 11)  Nystatin 100000 Unit/gm Powd (Nystatin) .... Apply to affected area bid 12)  Iron 325 (65 Fe) Mg Tabs (Ferrous sulfate) .... Take 1 by mouth daily for low iron 13)  Promethazine Hcl 25 Mg Tabs (Promethazine hcl) .... Take 1 by mouth every 6 hours as needed for nausea 14)  Senokot 8.6 Mg Tabs (Sennosides) .... 3 tablets by mouth daily for constipation 15)  Sorbitol 70 % Soln (Sorbitol) .... Take 2 tbsp every 2 hours until bm, then take 2 tbsp daily-qs 16)  Erythromycin Base 250 Mg Tabs (Erythromycin base) .... Take 1 by mouth 30 minutes before meals 17)  Pyridium 200 Mg Tabs (  Phenazopyridine hcl) .... Take 1 by mouth 3 times a day for 2 days. 18)  Ciprofloxacin Hcl 250 Mg Tabs (Ciprofloxacin hcl) .Marland Kitchen.. 1 by mouth two times a day for urine infection. 19)  Omeprazole 20 Mg Tbec (Omeprazole) .Marland Kitchen.. 1 by mouth two times a day for stomach pain. 20)  Metoclopramide Hcl 5 Mg Tabs (Metoclopramide hcl) .Marland Kitchen.. 1 by mouth before meals and qhs 21)  Fluconazole 150 Mg Tabs (Fluconazole) .Marland Kitchen.. 1 by mouth once 22)  Nystatin 100000 Unit/gm Powd (Nystatin) .... Apply to affected area bid 23)  Nystatin 100000 Unit/gm Crea (Nystatin) .... Apply to affected area bid  Other Orders: Reassessment Each 15 min unit- [facility] ([id number])  Patient  Instructions: 1)  LAST NIGHT YOU ATE 1 CUP OF BLACK-EYED PEAS (2 STARCHES), A CUP OF HAMBURGER HELPER (ANOTHER STARCH), AND 1/2 CUP SWEET POTATO (ANOTHER STARCH).  THIS IS PROBABLY WHY YOUR BLOOD SUGAR WAS HIGH THIS MORNING - TOO MUCH STARCH! 2)  WATERMELON, GRAPES, AND BANANAS ARE ALL FRUIT THAT SHOULD BE EATEN IN ONLY SMALL OR MEDIUM PORTION SIZES BECAUSE THEY WILL INCREASE YOUR BLOOD SUGAR.  THESE ARE FINE TO EAT, BUT ONLY HAVE 1/2 BANANA AT A TIME, OR A HANDFUL OF GRAPES, OR 1 CUP OF CUT-UP WATERMELON.  IF YOU WANT MORE THAN THAT ONE PORTION, HAVE IT AS PART OF ANOTHER MEAL, OR AS A SNACK.  YOU DON'T WANT ALL THAT CARBOHYDRATE AT ONE TIME! 3)  A BETTER LUNCH FOR YOU INSTEAD OF ALL FRUIT WOULD BE 1 SERVING OF FRUIT WITH COTTAGE CHEESE OR A SANDWICH.  BETTER YET, INCLUDE SOME VEG'S AT LUNCH AS WELL AS AT DINNER! 4)  BETTER FRUIT FOR YOU WOULD BE UNSWEETENED APPLE SAUCE, PEACHES, PEARS, BERRIES, AND CANTALOUPE.   5)  Schedule a follow-up appt for nutrition on July 25 at noon (on nurse schedule).

## 2010-11-15 NOTE — Progress Notes (Signed)
Summary: Pt declined home PT  Phone Note Other Incoming Call back at [phone]   Caller: [facility] Summary of Call: Recieved notes, but no orders for what pt needs.  Fax Number [phone] Initial call taken by: Clydell Hakim,  Feb 28, 2010 3:17 PM  Follow-up for Phone Call        Pt declined the services of this agency. Follow-up by: Cristoval Teall Swaziland MD,  Feb 28, 2010 10:23 PM

## 2010-11-15 NOTE — Miscellaneous (Signed)
Summary: d/c omeprazole, on dexlansoprazole  Clinical Lists Changes  Medications: Added new medication of PLAVIX 75 MG TABS (CLOPIDOGREL BISULFATE) 1 by mouth daily Removed medication of OMEPRAZOLE 20 MG TBEC (OMEPRAZOLE) 1 by mouth two times a day for stomach pain.

## 2010-11-15 NOTE — Progress Notes (Signed)
Summary: phn msg  Phone Note Other Incoming Call back at [phone]   Caller: [facility] Summary of Call: Fallen yesterday from wheelchair to potty chair.  No injury just sore.   Initial call taken by: Clydell Hakim,  November 06, 2009 3:21 PM  Follow-up for Phone Call        to PCP Follow-up by: Gladstone Pih,  November 06, 2009 4:22 PM  Additional Follow-up for Phone Call Additional follow up Details #1::        Spoke with Elnita Maxwell from [facility], who states pt fell while transferring.  No injury, but does feel sore.  EMS had to be called to get pt off floor. Spoke with Ms. Bungert, who states she was transferring from toilet to wheelchair and chair moved.  Denies injury, but feels sore all over.  Pt also c/o constipation, yeast infection and itching in eyes and ears.  Has appt on 2/10. Pt agreeable to waiting until appt, but will call if gets worse.  For constipation, pt taking Senekot, drinking prune and apple juice, eating prunes and greens.  Has daily BM, but would like to go more.  Discussed side effects of the narcotics.  Pt declines trial of reduced dose of them.  Pt reports she is not sitting in wet incontinence pads all day but is able to use toilet. Per pt, grandson admitted with meningitis to [facility] and is possibly being transferred to [facility].  Pt states she has not been exposed to her grandson. Additional Follow-up by: Laryn Venning Swaziland MD,  November 07, 2009 8:58 AM

## 2010-11-22 ENCOUNTER — Encounter: Payer: Self-pay | Admitting: Family Medicine

## 2010-11-22 ENCOUNTER — Ambulatory Visit (INDEPENDENT_AMBULATORY_CARE_PROVIDER_SITE_OTHER): Payer: Medicare Other | Admitting: Family Medicine

## 2010-11-22 DIAGNOSIS — IMO0002 Reserved for concepts with insufficient information to code with codable children: Secondary | ICD-10-CM

## 2010-11-22 DIAGNOSIS — T07XXXA Unspecified multiple injuries, initial encounter: Secondary | ICD-10-CM

## 2010-11-22 DIAGNOSIS — E119 Type 2 diabetes mellitus without complications: Secondary | ICD-10-CM

## 2010-11-22 MED ORDER — MORPHINE SULFATE CR 15 MG PO TB12: 15.0000 mg | ORAL_TABLET | Freq: Two times a day (BID) | ORAL | Status: DC

## 2010-11-22 MED ORDER — OXYCODONE-ACETAMINOPHEN 10-325 MG PO TABS: 1.0000 | ORAL_TABLET | Freq: Four times a day (QID) | ORAL | Status: DC | PRN

## 2010-11-22 MED ORDER — INSULIN REGULAR HUMAN 100 UNIT/ML IJ SOLN: 18.0000 [IU] | Freq: Three times a day (TID) | INTRAMUSCULAR | Status: DC

## 2010-11-22 NOTE — Patient Instructions (Signed)
It was good to see you today. If you have increased pain, redness, swelling in your leg, or if you develop a fever, please go to the ER.   You should have the bandages changed daily on your leg.
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
## 2010-11-26 MED ORDER — NYSTATIN 100000 UNIT/GM EX POWD: Freq: Two times a day (BID) | CUTANEOUS | Status: DC

## 2010-12-04 ENCOUNTER — Telehealth: Payer: Self-pay | Admitting: Family Medicine

## 2010-12-05 NOTE — Telephone Encounter (Signed)
I will send the lorazepam in.  We stopped the omeprazole because it can interact with the Plavix.  I believe she has an appt tomorrow and I am looking forward to seeing her then.

## 2010-12-06 ENCOUNTER — Ambulatory Visit (INDEPENDENT_AMBULATORY_CARE_PROVIDER_SITE_OTHER): Payer: Medicare Other | Admitting: Family Medicine

## 2010-12-06 ENCOUNTER — Encounter: Payer: Self-pay | Admitting: Family Medicine

## 2010-12-06 DIAGNOSIS — F411 Generalized anxiety disorder: Secondary | ICD-10-CM

## 2010-12-06 DIAGNOSIS — I10 Essential (primary) hypertension: Secondary | ICD-10-CM

## 2010-12-06 DIAGNOSIS — E1165 Type 2 diabetes mellitus with hyperglycemia: Secondary | ICD-10-CM

## 2010-12-06 DIAGNOSIS — S8010XA Contusion of unspecified lower leg, initial encounter: Secondary | ICD-10-CM

## 2010-12-06 DIAGNOSIS — E118 Type 2 diabetes mellitus with unspecified complications: Secondary | ICD-10-CM

## 2010-12-06 DIAGNOSIS — M549 Dorsalgia, unspecified: Secondary | ICD-10-CM

## 2010-12-06 MED ORDER — MORPHINE SULFATE CR 15 MG PO TB12: 15.0000 mg | ORAL_TABLET | Freq: Two times a day (BID) | ORAL | Status: DC

## 2010-12-06 MED ORDER — OXYCODONE-ACETAMINOPHEN 10-325 MG PO TABS: 1.0000 | ORAL_TABLET | Freq: Four times a day (QID) | ORAL | Status: DC | PRN

## 2010-12-06 MED ORDER — LORAZEPAM 0.5 MG PO TABS: 0.5000 mg | ORAL_TABLET | Freq: Two times a day (BID) | ORAL | Status: DC | PRN

## 2010-12-06 NOTE — Patient Instructions (Signed)
We will go ahead an write the prescriptions for the morphine and percocet, but the pharmacy won't fill them until March 10. Let me know if the proventil inhaler does not help. Let us know if you need help with getting the Vaseline gauze for your legs.  I will see you March 15th, but let us know if you need anything from [name] before then.

## 2010-12-07 NOTE — Assessment & Plan Note (Signed)
Generally runs low on current regimen, but on these meds for other indications as well.  Repeat BP today 130/50. No change today in meds

## 2010-12-07 NOTE — Assessment & Plan Note (Signed)
Meds refilled today with do not fill orders until March.  No appts available with me until after meds are due.

## 2010-12-07 NOTE — Assessment & Plan Note (Signed)
Doing well on lorazepam, almost BID.  Continue current regimen.  Refills today

## 2010-12-07 NOTE — Assessment & Plan Note (Signed)
Improved.  C/w vaseline gauze dressing.

## 2010-12-07 NOTE — Assessment & Plan Note (Addendum)
Reasonable control for this pt who has limited options for healthy food and is so deconditioned that she is wheelchair bound.  Continue current regimen.

## 2010-12-07 NOTE — Progress Notes (Signed)
-   Subjective:    Patient ID: Holly Kim, female    DOB: [birth year], 70 y.o.   MRN: [id number]  HPI 70 yo female for f/u several issues: DM2: Taking meds as prescribed.  Glucose ranges 69-235.  No symptomatic hypoglycemia.  Feels good about sugars.  S/p fall 2 weeks ago.  Still with pain in legs.  Getting wound care a few times a week [facility] and then other sources on other days.  She declines nursing home placement for wound care.  Using Vaseline guaze, but ran out.  Bed sore is healed per pt.  She declines exam today.  Has a new cream for it.  States she would like her inhalers refilled.  She has been on albuterol inhaler in the past, but not on med list now.    Denies abdominal pain, chest pain or shortness of breath.  Plans to move into new home this weekend.   A friend is building her a ramp for free, and her brother offered to buy a stove and fridge for it.  Reports her son, [name], has been doing lots of repairs on house to get it ready.  Very happy about this situation.    Review of Systems See HPI     Objective:   Physical Exam    Gen: Obese, wheelchair bound.  Vitals noted and BP repeated.  No distress. CV:  RRR with distant heart sounds.  No m/g/r noted. Lungs:  Poor air movement.  No wheeze, rales noted. Psych: Typical grooming and dress.  Bright affect, not depressed appearing.  Non labile.  Nl TC/TP without LOA or FOI.   Skin:  Abrasions on BLE healing with scab in place.  No surrounding erythema or swelling or warmth.    Assessment & Plan:

## 2010-12-09 ENCOUNTER — Inpatient Hospital Stay (HOSPITAL_COMMUNITY)
Admission: EM | Admit: 2010-12-09 | Discharge: 2010-12-13 | DRG: 313 | Disposition: A | Discharge status: Skilled Nursing Facility | Payer: Medicare Other | Attending: Family Medicine | Admitting: Family Medicine

## 2010-12-09 ENCOUNTER — Emergency Department (HOSPITAL_COMMUNITY): Payer: Medicare Other

## 2010-12-09 ENCOUNTER — Encounter: Payer: Self-pay | Admitting: Family Medicine

## 2010-12-09 DIAGNOSIS — Z8673 Personal history of transient ischemic attack (TIA), and cerebral infarction without residual deficits: Secondary | ICD-10-CM

## 2010-12-09 DIAGNOSIS — E119 Type 2 diabetes mellitus without complications: Secondary | ICD-10-CM | POA: Diagnosis present

## 2010-12-09 DIAGNOSIS — G609 Hereditary and idiopathic neuropathy, unspecified: Secondary | ICD-10-CM | POA: Diagnosis present

## 2010-12-09 DIAGNOSIS — I2510 Atherosclerotic heart disease of native coronary artery without angina pectoris: Secondary | ICD-10-CM | POA: Diagnosis present

## 2010-12-09 DIAGNOSIS — G8929 Other chronic pain: Secondary | ICD-10-CM | POA: Diagnosis present

## 2010-12-09 DIAGNOSIS — K5909 Other constipation: Secondary | ICD-10-CM | POA: Diagnosis present

## 2010-12-09 DIAGNOSIS — I252 Old myocardial infarction: Secondary | ICD-10-CM

## 2010-12-09 DIAGNOSIS — M549 Dorsalgia, unspecified: Secondary | ICD-10-CM | POA: Diagnosis present

## 2010-12-09 DIAGNOSIS — Z9861 Coronary angioplasty status: Secondary | ICD-10-CM

## 2010-12-09 DIAGNOSIS — J4489 Other specified chronic obstructive pulmonary disease: Secondary | ICD-10-CM | POA: Diagnosis present

## 2010-12-09 DIAGNOSIS — Z79899 Other long term (current) drug therapy: Secondary | ICD-10-CM

## 2010-12-09 DIAGNOSIS — Z7902 Long term (current) use of antithrombotics/antiplatelets: Secondary | ICD-10-CM

## 2010-12-09 DIAGNOSIS — Z86711 Personal history of pulmonary embolism: Secondary | ICD-10-CM

## 2010-12-09 DIAGNOSIS — I872 Venous insufficiency (chronic) (peripheral): Secondary | ICD-10-CM | POA: Diagnosis present

## 2010-12-09 DIAGNOSIS — I10 Essential (primary) hypertension: Secondary | ICD-10-CM | POA: Diagnosis present

## 2010-12-09 DIAGNOSIS — Z794 Long term (current) use of insulin: Secondary | ICD-10-CM

## 2010-12-09 DIAGNOSIS — R0789 Other chest pain: Principal | ICD-10-CM | POA: Diagnosis present

## 2010-12-09 DIAGNOSIS — B9689 Other specified bacterial agents as the cause of diseases classified elsewhere: Secondary | ICD-10-CM | POA: Diagnosis present

## 2010-12-09 DIAGNOSIS — J449 Chronic obstructive pulmonary disease, unspecified: Secondary | ICD-10-CM | POA: Diagnosis present

## 2010-12-09 DIAGNOSIS — Z86718 Personal history of other venous thrombosis and embolism: Secondary | ICD-10-CM

## 2010-12-09 DIAGNOSIS — K219 Gastro-esophageal reflux disease without esophagitis: Secondary | ICD-10-CM | POA: Diagnosis present

## 2010-12-09 DIAGNOSIS — D649 Anemia, unspecified: Secondary | ICD-10-CM | POA: Diagnosis present

## 2010-12-09 DIAGNOSIS — F341 Dysthymic disorder: Secondary | ICD-10-CM | POA: Diagnosis present

## 2010-12-09 DIAGNOSIS — Z7982 Long term (current) use of aspirin: Secondary | ICD-10-CM

## 2010-12-09 DIAGNOSIS — N390 Urinary tract infection, site not specified: Secondary | ICD-10-CM | POA: Diagnosis present

## 2010-12-09 DIAGNOSIS — F603 Borderline personality disorder: Secondary | ICD-10-CM | POA: Diagnosis present

## 2010-12-09 DIAGNOSIS — E785 Hyperlipidemia, unspecified: Secondary | ICD-10-CM | POA: Diagnosis present

## 2010-12-09 LAB — COMPREHENSIVE METABOLIC PANEL
ALT: 15 U/L (ref 0–35)
Alkaline Phosphatase: 51 U/L (ref 39–117)
CO2: 37 mEq/L — ABNORMAL HIGH (ref 19–32)
Chloride: 92 mEq/L — ABNORMAL LOW (ref 96–112)
GFR calc non Af Amer: 50 mL/min — ABNORMAL LOW (ref >60)
Glucose, Bld: 85 mg/dL (ref 70–99)
Potassium: 4 mEq/L (ref 3.5–5.1)
Sodium: 138 mEq/L (ref 135–145)
Total Bilirubin: <0.1 mg/dL — ABNORMAL LOW (ref 0.3–1.2)

## 2010-12-09 LAB — DIFFERENTIAL
Lymphocytes Relative: 31 % (ref 12–46)
Lymphs Abs: 2.9 10*3/uL (ref 0.7–4.0)
Monocytes Relative: 5 % (ref 3–12)
Neutrophils Relative %: 64 % (ref 43–77)

## 2010-12-09 LAB — CBC
HCT: 27.7 % — ABNORMAL LOW (ref 36.0–46.0)
MCV: 83.4 fL (ref 78.0–100.0)
RBC: 3.32 MIL/uL — ABNORMAL LOW (ref 3.87–5.11)
WBC: 9.5 10*3/uL (ref 4.0–10.5)

## 2010-12-09 LAB — URINALYSIS, ROUTINE W REFLEX MICROSCOPIC
Bilirubin Urine: NEGATIVE
Nitrite: NEGATIVE
Protein, ur: NEGATIVE mg/dL
Specific Gravity, Urine: 1.006 (ref 1.005–1.030)
Urobilinogen, UA: 0.2 mg/dL (ref 0.0–1.0)

## 2010-12-09 LAB — CK TOTAL AND CKMB (NOT AT ARMC): Relative Index: INVALID (ref 0.0–2.5)

## 2010-12-09 LAB — URINE MICROSCOPIC-ADD ON
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
## 2010-12-10 ENCOUNTER — Telehealth (INDEPENDENT_AMBULATORY_CARE_PROVIDER_SITE_OTHER): Payer: Self-pay | Admitting: *Deleted

## 2010-12-10 DIAGNOSIS — R109 Unspecified abdominal pain: Secondary | ICD-10-CM

## 2010-12-10 DIAGNOSIS — R0789 Other chest pain: Secondary | ICD-10-CM

## 2010-12-10 DIAGNOSIS — M79609 Pain in unspecified limb: Secondary | ICD-10-CM

## 2010-12-10 DIAGNOSIS — J441 Chronic obstructive pulmonary disease with (acute) exacerbation: Secondary | ICD-10-CM

## 2010-12-10 LAB — LIPID PANEL
LDL Cholesterol: 52 mg/dL (ref 0–99)
Triglycerides: 78 mg/dL (ref <150)
VLDL: 16 mg/dL (ref 0–40)

## 2010-12-10 LAB — TSH: TSH: 1.302 u[IU]/mL (ref 0.350–4.500)

## 2010-12-10 LAB — GLUCOSE, CAPILLARY
Glucose-Capillary: 157 mg/dL — ABNORMAL HIGH (ref 70–99)
Glucose-Capillary: 243 mg/dL — ABNORMAL HIGH (ref 70–99)

## 2010-12-10 LAB — LIPASE, BLOOD: Lipase: 12 U/L (ref 11–59)

## 2010-12-10 LAB — BASIC METABOLIC PANEL
BUN: 18 mg/dL (ref 6–23)
Chloride: 95 mEq/L — ABNORMAL LOW (ref 96–112)
Creatinine, Ser: 0.98 mg/dL (ref 0.4–1.2)

## 2010-12-10 LAB — CBC
HCT: 28.1 % — ABNORMAL LOW (ref 36.0–46.0)
Hemoglobin: 8.2 g/dL — ABNORMAL LOW (ref 12.0–15.0)
RDW: 15.5 % (ref 11.5–15.5)
WBC: 8.2 10*3/uL (ref 4.0–10.5)

## 2010-12-10 LAB — CK TOTAL AND CKMB (NOT AT ARMC): Relative Index: INVALID (ref 0.0–2.5)

## 2010-12-10 LAB — TROPONIN I: Troponin I: 0.02 ng/mL (ref 0.00–0.06)

## 2010-12-10 LAB — CARDIAC PANEL(CRET KIN+CKTOT+MB+TROPI): Troponin I: 0.01 ng/mL (ref 0.00–0.06)

## 2010-12-11 LAB — URINE CULTURE
Colony Count: >=100000
Culture  Setup Time: 201202271032

## 2010-12-11 LAB — CBC
HCT: 25.5 % — ABNORMAL LOW (ref 36.0–46.0)
Hemoglobin: 7.5 g/dL — ABNORMAL LOW (ref 12.0–15.0)
RBC: 3.11 MIL/uL — ABNORMAL LOW (ref 3.87–5.11)
WBC: 7 10*3/uL (ref 4.0–10.5)

## 2010-12-11 LAB — GLUCOSE, CAPILLARY
Glucose-Capillary: 173 mg/dL — ABNORMAL HIGH (ref 70–99)
Glucose-Capillary: 218 mg/dL — ABNORMAL HIGH (ref 70–99)

## 2010-12-11 LAB — BASIC METABOLIC PANEL
CO2: 37 mEq/L — ABNORMAL HIGH (ref 19–32)
Glucose, Bld: 315 mg/dL — ABNORMAL HIGH (ref 70–99)
Potassium: 4.6 mEq/L (ref 3.5–5.1)
Sodium: 139 mEq/L (ref 135–145)
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
## 2010-12-12 LAB — VITAMIN B12: Vitamin B-12: 368 pg/mL (ref 211–911)

## 2010-12-12 LAB — HEPATIC FUNCTION PANEL
ALT: 16 U/L (ref 0–35)
AST: 18 U/L (ref 0–37)
Albumin: 2.9 g/dL — ABNORMAL LOW (ref 3.5–5.2)
Bilirubin, Direct: <0.1 mg/dL (ref 0.0–0.3)
Total Protein: 6.6 g/dL (ref 6.0–8.3)

## 2010-12-12 LAB — FERRITIN: Ferritin: 397 ng/mL — ABNORMAL HIGH (ref 10–291)

## 2010-12-12 LAB — GLUCOSE, CAPILLARY
Glucose-Capillary: 131 mg/dL — ABNORMAL HIGH (ref 70–99)
Glucose-Capillary: 110 mg/dL — ABNORMAL HIGH (ref 70–99)

## 2010-12-13 DIAGNOSIS — J441 Chronic obstructive pulmonary disease with (acute) exacerbation: Secondary | ICD-10-CM

## 2010-12-13 DIAGNOSIS — R0789 Other chest pain: Secondary | ICD-10-CM

## 2010-12-13 DIAGNOSIS — R109 Unspecified abdominal pain: Secondary | ICD-10-CM

## 2010-12-13 LAB — GLUCOSE, CAPILLARY

## 2010-12-17 NOTE — Discharge Summary (Signed)
Holly Kim, Holly Kim NO.:  [id number]  MEDICAL RECORD NO.:  [id number]           PATIENT TYPE:  I  LOCATION:  2004                         FACILITY:  [facility]  PHYSICIAN:  Pearlean Brownie, M.D.DATE OF BIRTH:  11/07/1940  DATE OF ADMISSION:  12/09/2010 DATE OF DISCHARGE:  12/11/2010                              DISCHARGE SUMMARY   PRIMARY CARE PROVIDER:  Sarah Swaziland, MD at [facility].  DISCHARGE DIAGNOSES: 1. Atypical chest pain. 2. Urinary tract infection. 3. Chronic back and abdominal pain. 4. Diabetes. 5. Hyperlipidemia. 6. Morbid obesity. 7. Normocytic anemia with baseline hemoglobin around 8. 8. Anxiety. 9. Depression. 10.Borderline personality. 11.Tardive dyskinesia. 12.Peripheral neuropathy. 13.Hypertension. 14.Coronary artery disease status post stenting in 2011. 15.History of deep vein thrombosis. 16.Chronic venous insufficiency. 17.Chronic obstructive pulmonary disease. 18.Gastroesophageal reflux disease. 19.Fatigue. 20.History of pulmonary embolism.  DISCHARGE MEDICATIONS: 1. Aspirin 81 mg by mouth daily. 2. Plavix 75 mg by mouth daily. 3. Dexilant 60 mg by mouth daily. 4. Erythromycin 250 mg by mouth 30 minutes before meals. 5. Ferrous sulfate 325 mg by mouth daily. 6. Lantus 55 units subcutaneously daily. 7. Novolin 18 units subcutaneously three times daily with meals. 8. Lisinopril 5 mg by mouth daily. 9. Ativan 0.5 mg by mouth two times daily as needed for anxiety. 10.Reglan 5 mg by mouth four times daily. 11.Metoprolol 25 mg by mouth b.i.d. 12.MS Contin sustained release 15 mg by mouth two times daily. 13.Multivitamin. 14.Nystatin topical cream. 15.Nystatin powder. 16.Percocet 10/325 one tablet by mouth every 8 hours for severe pain. 17.Phenergan 25 mg by mouth every 6 hours as needed. 18.Zantac 150 mg by mouth two times a day. 19.Crestor 40 mg by mouth at bedtime. 20.Senna 8.6 mg tablets, three tablets by  mouth daily. 21.Sorbitol 70% solution two tablespoons every 2 hours when     constipated until a bowel movement, then 2 tablespoons daily. 22.Carafate 1 g by mouth three times daily before meals. 23.Demadex 20 mg by mouth two times daily. 24.Prednisone 50 mg by mouth daily x3 days. 25.Doxycycline 100 mg by mouth twice daily for 8 days. 26.Keflex 500 mg by mouth three times daily.  LABS AND STUDIES:  Labs on the date of admission notable for UA with small leukocytes, microscopic demonstrated 3-6 whites and many bacteria. Urine culture grew Gram-negative rods greater than 100,000, had not speciated at the time of discharge.  CBC demonstrated white count 9.5, hemoglobin 8.2, platelets 182.  BNP was 58.  Troponins of the time of admission were 0.01.  Complete metabolic panel was notable only for chloride of 37 and a creatinine of 1.07.  Repeat troponins were negative.  Fasting lipid panel demonstrated HDL 35, LDL 52, triglycerides 78, total cholesterol 103.  On the date of discharge, the patient's hemoglobin was found to be 7.5, platelets of 172, white count of 7.  Basic metabolic was relatively unremarkable at this time.  IMAGING:  Chest x-ray performed on December 09, 2010, demonstrated persistent left basilar airspace opacification with left pleural effusion versus stable atelectasis, but could not rule out pneumonia and cardiomegaly.  CONSULTS:  None.  BRIEF HOSPITAL COURSE:  This is a 70 year old female,  who is well known to the [facility] who presented with chest pain, shortness of breath, and dysuria. 1. Chest pain.  The patient has multiple risk factors for acute     coronary syndrome, although she has had multiple presentations with     atypical chest pain.  The patient was admitted and ruled out for     chest pain.  There were no changes on her EKG from her baseline.     Troponins were cycled and were negative.  The patient was     transitioned back to her home  pain medicines and continued on her     aspirin and plavix for her stents. 2. Dysuria:  The patient was found to have bacteria on urinalysis.     The patient was began on Keflex for this problem and experienced     relief from her symptoms.  Urine culture grew Gram-negative rods,     although sensitivities had not been determined at the time of     discharge. 3. Shortness of breath.  The patient presented with complaints of     subjective shortness of breath.  She has home O2 requirement of 3-4     liters of oxygen via nasal cannula and did not have any increased     O2 requirement.  Lower extremity Doppler were performed and were     negative.  The patient was continued on her home O2, was given     treatment for possible COPD exacerbation with doxycycline,     prednisone, and Xopenex nebs.  Her symptoms improved significantly     throughout her time in the hospital.  She was discharged on her     home O2. 4. Chronic pain.  The patient came to the hospital complaining of     increase in her chronic pain.  After consultation with the     patient's primary physician and clinic record, it was determined     that these complaints have actually been chronic and the patient is     in the process of transferring the care of her pain to an actual     pain clinic.  It was noted that the initial referral for the pain     clinic was made for abdominal pain, which was denied.  The patient     has multiple pain complaints and has been attempting to escalate     usage of narcotics.  The patient was continued on her home narcotic     regimen while in the hospital and the patient's pain regimen will     not be escalated by our clinic until she has been seen and     evaluated by pain management. 5. Anemia.  The patient is noted to have a hemoglobin of approximately     8.  The patient's primary care provider was well aware of this     problem and has had discussions with the patient about this  before.     The patient has declined screening on multiple occasions in the     past and when evaluated by GI, has been deemed to be high of a risk     for either colonoscopy for upper endoscopy.  The patient's     hemoglobin was stable and at her baseline throughout her time in     the hospital. 6. Social.  It was found that while the patient was in the hospital,     she  was unable to return home due to various renovations in her     home.  Accordingly, Social Work and Physical Therapy were     consulted, as it was found that the patient was eligible for     skilled nursing facility placement and this was arranged.  DISCHARGE INSTRUCTIONS:  The patient was discharged to skilled nursing facility with instructions to have activity as tolerated, eat a low- calorie diabetic friendly diet, and to follow up with her primary care provider, Dr. Swaziland at [facility] on December 27, 2010, at 1:45 in the afternoon.  ISSUES FOR FOLLOWUP: 1. Pain clinic appointment.  It would appear that the patient's     initial referral to the pain clinic was made for abdominal pain,     which was denied.  This referral will need to be re-made with a     list of pain issues including back and whole body pain. 2. Anemia.  Should continue to have discussions with the patient about     the need for screening to determine if the patient has underlying     malignancy.  Continued monitoring of the patient's hemoglobin is     recommended.    ______________________________ Majel Homer, MD   ______________________________ Pearlean Brownie, M.D.    ER/MEDQ  D:  12/11/2010  T:  12/11/2010  Job:  [id number]  cc:   Sarah Swaziland, MD  Electronically Signed by Manuela Neptune MD on 12/14/2010 07:04:30 AM Electronically Signed by Pearlean Brownie M.D. on 12/17/2010 11:32:02 AM

## 2010-12-20 ENCOUNTER — Ambulatory Visit: Payer: Medicare Other | Admitting: Family Medicine

## 2010-12-20 NOTE — Progress Notes (Signed)
  Request for Records received from [facility] Dept of Health & Human Services sent to [facility]  December 10, 2010 4:09 PM

## 2010-12-26 LAB — BASIC METABOLIC PANEL
BUN: 22 mg/dL (ref 6–23)
CO2: 33 mEq/L — ABNORMAL HIGH (ref 19–32)
Calcium: 8.3 mg/dL — ABNORMAL LOW (ref 8.4–10.5)
GFR calc non Af Amer: 57 mL/min — ABNORMAL LOW (ref >60)
Glucose, Bld: 251 mg/dL — ABNORMAL HIGH (ref 70–99)
Sodium: 134 mEq/L — ABNORMAL LOW (ref 135–145)

## 2010-12-26 LAB — CBC
HCT: 29 % — ABNORMAL LOW (ref 36.0–46.0)
Hemoglobin: 8.5 g/dL — ABNORMAL LOW (ref 12.0–15.0)
MCH: 25.2 pg — ABNORMAL LOW (ref 26.0–34.0)
MCHC: 29.3 g/dL — ABNORMAL LOW (ref 30.0–36.0)
RDW: 14.4 % (ref 11.5–15.5)

## 2010-12-26 LAB — DIFFERENTIAL
Basophils Absolute: 0 10*3/uL (ref 0.0–0.1)
Basophils Relative: 0 % (ref 0–1)
Eosinophils Absolute: 0.1 10*3/uL (ref 0.0–0.7)
Monocytes Absolute: 0.4 10*3/uL (ref 0.1–1.0)
Monocytes Relative: 5 % (ref 3–12)
Neutrophils Relative %: 79 % — ABNORMAL HIGH (ref 43–77)

## 2010-12-27 ENCOUNTER — Ambulatory Visit: Payer: Medicare Other | Admitting: Family Medicine

## 2010-12-27 LAB — GLUCOSE, CAPILLARY
Glucose-Capillary: 115 mg/dL — ABNORMAL HIGH (ref 70–99)
Glucose-Capillary: 129 mg/dL — ABNORMAL HIGH (ref 70–99)
Glucose-Capillary: 137 mg/dL — ABNORMAL HIGH (ref 70–99)
Glucose-Capillary: 164 mg/dL — ABNORMAL HIGH (ref 70–99)
Glucose-Capillary: 178 mg/dL — ABNORMAL HIGH (ref 70–99)
Glucose-Capillary: 189 mg/dL — ABNORMAL HIGH (ref 70–99)
Glucose-Capillary: 190 mg/dL — ABNORMAL HIGH (ref 70–99)
Glucose-Capillary: 193 mg/dL — ABNORMAL HIGH (ref 70–99)
Glucose-Capillary: 200 mg/dL — ABNORMAL HIGH (ref 70–99)
Glucose-Capillary: 210 mg/dL — ABNORMAL HIGH (ref 70–99)
Glucose-Capillary: 226 mg/dL — ABNORMAL HIGH (ref 70–99)
Glucose-Capillary: 228 mg/dL — ABNORMAL HIGH (ref 70–99)
Glucose-Capillary: 251 mg/dL — ABNORMAL HIGH (ref 70–99)
Glucose-Capillary: 251 mg/dL — ABNORMAL HIGH (ref 70–99)
Glucose-Capillary: 257 mg/dL — ABNORMAL HIGH (ref 70–99)
Glucose-Capillary: 313 mg/dL — ABNORMAL HIGH (ref 70–99)
Glucose-Capillary: 65 mg/dL — ABNORMAL LOW (ref 70–99)
Glucose-Capillary: 68 mg/dL — ABNORMAL LOW (ref 70–99)
Glucose-Capillary: 78 mg/dL (ref 70–99)
Glucose-Capillary: 110 mg/dL — ABNORMAL HIGH (ref 70–99)
Glucose-Capillary: 112 mg/dL — ABNORMAL HIGH (ref 70–99)
Glucose-Capillary: 115 mg/dL — ABNORMAL HIGH (ref 70–99)
Glucose-Capillary: 122 mg/dL — ABNORMAL HIGH (ref 70–99)
Glucose-Capillary: 150 mg/dL — ABNORMAL HIGH (ref 70–99)
Glucose-Capillary: 178 mg/dL — ABNORMAL HIGH (ref 70–99)
Glucose-Capillary: 181 mg/dL — ABNORMAL HIGH (ref 70–99)
Glucose-Capillary: 199 mg/dL — ABNORMAL HIGH (ref 70–99)
Glucose-Capillary: 200 mg/dL — ABNORMAL HIGH (ref 70–99)
Glucose-Capillary: 207 mg/dL — ABNORMAL HIGH (ref 70–99)
Glucose-Capillary: 212 mg/dL — ABNORMAL HIGH (ref 70–99)
Glucose-Capillary: 218 mg/dL — ABNORMAL HIGH (ref 70–99)
Glucose-Capillary: 225 mg/dL — ABNORMAL HIGH (ref 70–99)
Glucose-Capillary: 226 mg/dL — ABNORMAL HIGH (ref 70–99)
Glucose-Capillary: 231 mg/dL — ABNORMAL HIGH (ref 70–99)
Glucose-Capillary: 239 mg/dL — ABNORMAL HIGH (ref 70–99)
Glucose-Capillary: 243 mg/dL — ABNORMAL HIGH (ref 70–99)
Glucose-Capillary: 266 mg/dL — ABNORMAL HIGH (ref 70–99)
Glucose-Capillary: 270 mg/dL — ABNORMAL HIGH (ref 70–99)
Glucose-Capillary: 282 mg/dL — ABNORMAL HIGH (ref 70–99)
Glucose-Capillary: 64 mg/dL — ABNORMAL LOW (ref 70–99)
Glucose-Capillary: 93 mg/dL (ref 70–99)

## 2010-12-27 LAB — URINALYSIS, MICROSCOPIC ONLY
Bilirubin Urine: NEGATIVE
Glucose, UA: NEGATIVE mg/dL
Hgb urine dipstick: NEGATIVE
Ketones, ur: NEGATIVE mg/dL
Nitrite: NEGATIVE
Specific Gravity, Urine: 1.011 (ref 1.005–1.030)
pH: 5.5 (ref 5.0–8.0)

## 2010-12-27 LAB — BASIC METABOLIC PANEL
BUN: 14 mg/dL (ref 6–23)
BUN: 15 mg/dL (ref 6–23)
BUN: 17 mg/dL (ref 6–23)
BUN: 21 mg/dL (ref 6–23)
BUN: 23 mg/dL (ref 6–23)
BUN: 25 mg/dL — ABNORMAL HIGH (ref 6–23)
CO2: 37 mEq/L — ABNORMAL HIGH (ref 19–32)
CO2: 40 mEq/L — ABNORMAL HIGH (ref 19–32)
CO2: 33 mEq/L — ABNORMAL HIGH (ref 19–32)
CO2: 34 mEq/L — ABNORMAL HIGH (ref 19–32)
CO2: 36 mEq/L — ABNORMAL HIGH (ref 19–32)
Calcium: 8.3 mg/dL — ABNORMAL LOW (ref 8.4–10.5)
Calcium: 8.5 mg/dL (ref 8.4–10.5)
Calcium: 8.8 mg/dL (ref 8.4–10.5)
Calcium: 8 mg/dL — ABNORMAL LOW (ref 8.4–10.5)
Calcium: 8.1 mg/dL — ABNORMAL LOW (ref 8.4–10.5)
Calcium: 8.2 mg/dL — ABNORMAL LOW (ref 8.4–10.5)
Calcium: 8.3 mg/dL — ABNORMAL LOW (ref 8.4–10.5)
Chloride: 93 mEq/L — ABNORMAL LOW (ref 96–112)
Chloride: 94 mEq/L — ABNORMAL LOW (ref 96–112)
Chloride: 94 mEq/L — ABNORMAL LOW (ref 96–112)
Chloride: 95 mEq/L — ABNORMAL LOW (ref 96–112)
Chloride: 94 mEq/L — ABNORMAL LOW (ref 96–112)
Creatinine, Ser: 0.93 mg/dL (ref 0.4–1.2)
Creatinine, Ser: 1 mg/dL (ref 0.4–1.2)
Creatinine, Ser: 1.17 mg/dL (ref 0.4–1.2)
Creatinine, Ser: 1.11 mg/dL (ref 0.4–1.2)
GFR calc Af Amer: 54 mL/min — ABNORMAL LOW (ref >60)
GFR calc Af Amer: 55 mL/min — ABNORMAL LOW (ref >60)
GFR calc Af Amer: 22 mL/min — ABNORMAL LOW (ref >60)
GFR calc Af Amer: 27 mL/min — ABNORMAL LOW (ref >60)
GFR calc Af Amer: 48 mL/min — ABNORMAL LOW (ref >60)
GFR calc Af Amer: 59 mL/min — ABNORMAL LOW (ref >60)
GFR calc non Af Amer: 46 mL/min — ABNORMAL LOW (ref >60)
GFR calc non Af Amer: 53 mL/min — ABNORMAL LOW (ref >60)
GFR calc non Af Amer: 19 mL/min — ABNORMAL LOW (ref >60)
GFR calc non Af Amer: 22 mL/min — ABNORMAL LOW (ref >60)
GFR calc non Af Amer: 23 mL/min — ABNORMAL LOW (ref >60)
GFR calc non Af Amer: 40 mL/min — ABNORMAL LOW (ref >60)
Glucose, Bld: 179 mg/dL — ABNORMAL HIGH (ref 70–99)
Glucose, Bld: 200 mg/dL — ABNORMAL HIGH (ref 70–99)
Glucose, Bld: 220 mg/dL — ABNORMAL HIGH (ref 70–99)
Glucose, Bld: 145 mg/dL — ABNORMAL HIGH (ref 70–99)
Glucose, Bld: 194 mg/dL — ABNORMAL HIGH (ref 70–99)
Glucose, Bld: 321 mg/dL — ABNORMAL HIGH (ref 70–99)
Glucose, Bld: 85 mg/dL (ref 70–99)
Potassium: 4.5 mEq/L (ref 3.5–5.1)
Potassium: 3.9 mEq/L (ref 3.5–5.1)
Potassium: 4.3 mEq/L (ref 3.5–5.1)
Potassium: 4.3 mEq/L (ref 3.5–5.1)
Sodium: 142 mEq/L (ref 135–145)
Sodium: 143 mEq/L (ref 135–145)
Sodium: 136 mEq/L (ref 135–145)
Sodium: 136 mEq/L (ref 135–145)
Sodium: 137 mEq/L (ref 135–145)
Sodium: 138 mEq/L (ref 135–145)
Sodium: 138 mEq/L (ref 135–145)
Sodium: 138 mEq/L (ref 135–145)

## 2010-12-27 LAB — CBC
HCT: 26.1 % — ABNORMAL LOW (ref 36.0–46.0)
HCT: 26.9 % — ABNORMAL LOW (ref 36.0–46.0)
HCT: 27.1 % — ABNORMAL LOW (ref 36.0–46.0)
HCT: 27.7 % — ABNORMAL LOW (ref 36.0–46.0)
HCT: 26.8 % — ABNORMAL LOW (ref 36.0–46.0)
HCT: 27.4 % — ABNORMAL LOW (ref 36.0–46.0)
HCT: 28.8 % — ABNORMAL LOW (ref 36.0–46.0)
HCT: 29.8 % — ABNORMAL LOW (ref 36.0–46.0)
HCT: 32.6 % — ABNORMAL LOW (ref 36.0–46.0)
Hemoglobin: 7.9 g/dL — ABNORMAL LOW (ref 12.0–15.0)
Hemoglobin: 8 g/dL — ABNORMAL LOW (ref 12.0–15.0)
Hemoglobin: 8.3 g/dL — ABNORMAL LOW (ref 12.0–15.0)
Hemoglobin: 8.3 g/dL — ABNORMAL LOW (ref 12.0–15.0)
Hemoglobin: 8.3 g/dL — ABNORMAL LOW (ref 12.0–15.0)
Hemoglobin: 8.5 g/dL — ABNORMAL LOW (ref 12.0–15.0)
Hemoglobin: 8.7 g/dL — ABNORMAL LOW (ref 12.0–15.0)
Hemoglobin: 9.5 g/dL — ABNORMAL LOW (ref 12.0–15.0)
MCH: 25.2 pg — ABNORMAL LOW (ref 26.0–34.0)
MCH: 25.6 pg — ABNORMAL LOW (ref 26.0–34.0)
MCH: 25.7 pg — ABNORMAL LOW (ref 26.0–34.0)
MCH: 25.8 pg — ABNORMAL LOW (ref 26.0–34.0)
MCH: 26.2 pg (ref 26.0–34.0)
MCH: 26.2 pg (ref 26.0–34.0)
MCH: 26.5 pg (ref 26.0–34.0)
MCH: 26.4 pg (ref 26.0–34.0)
MCH: 26.8 pg (ref 26.0–34.0)
MCH: 27.1 pg (ref 26.0–34.0)
MCH: 27 pg (ref 26.0–34.0)
MCHC: 29.7 g/dL — ABNORMAL LOW (ref 30.0–36.0)
MCHC: 29.7 g/dL — ABNORMAL LOW (ref 30.0–36.0)
MCHC: 30.3 g/dL (ref 30.0–36.0)
MCHC: 30.5 g/dL (ref 30.0–36.0)
MCHC: 30.6 g/dL (ref 30.0–36.0)
MCHC: 31 g/dL (ref 30.0–36.0)
MCHC: 31 g/dL (ref 30.0–36.0)
MCHC: 31.7 g/dL (ref 30.0–36.0)
MCHC: 31.8 g/dL (ref 30.0–36.0)
MCHC: 31.9 g/dL (ref 30.0–36.0)
MCHC: 32.2 g/dL (ref 30.0–36.0)
MCV: 82.7 fL (ref 78.0–100.0)
MCV: 84.7 fL (ref 78.0–100.0)
MCV: 84.9 fL (ref 78.0–100.0)
MCV: 85.4 fL (ref 78.0–100.0)
MCV: 85.5 fL (ref 78.0–100.0)
MCV: 85.8 fL (ref 78.0–100.0)
MCV: 86.6 fL (ref 78.0–100.0)
MCV: 83.3 fL (ref 78.0–100.0)
MCV: 84.5 fL (ref 78.0–100.0)
MCV: 84.9 fL (ref 78.0–100.0)
MCV: 85.5 fL (ref 78.0–100.0)
MCV: 83.8 fL (ref 78.0–100.0)
Platelets: 166 10*3/uL (ref 150–400)
Platelets: 170 K/uL (ref 150–400)
Platelets: 174 K/uL (ref 150–400)
Platelets: 178 K/uL (ref 150–400)
Platelets: 182 10*3/uL (ref 150–400)
Platelets: 194 10*3/uL (ref 150–400)
Platelets: 160 10*3/uL (ref 150–400)
Platelets: 173 K/uL (ref 150–400)
Platelets: 176 10*3/uL (ref 150–400)
Platelets: 185 K/uL (ref 150–400)
RBC: 3.08 MIL/uL — ABNORMAL LOW (ref 3.87–5.11)
RBC: 3.17 MIL/uL — ABNORMAL LOW (ref 3.87–5.11)
RBC: 3.17 MIL/uL — ABNORMAL LOW (ref 3.87–5.11)
RBC: 3.22 MIL/uL — ABNORMAL LOW (ref 3.87–5.11)
RBC: 3.17 MIL/uL — ABNORMAL LOW (ref 3.87–5.11)
RBC: 3.29 MIL/uL — ABNORMAL LOW (ref 3.87–5.11)
RBC: 3.37 MIL/uL — ABNORMAL LOW (ref 3.87–5.11)
RBC: 3.51 MIL/uL — ABNORMAL LOW (ref 3.87–5.11)
RDW: 13.5 % (ref 11.5–15.5)
RDW: 13.6 % (ref 11.5–15.5)
RDW: 13.7 % (ref 11.5–15.5)
RDW: 13.7 % (ref 11.5–15.5)
RDW: 13.7 % (ref 11.5–15.5)
RDW: 13.5 % (ref 11.5–15.5)
RDW: 13.6 % (ref 11.5–15.5)
RDW: 13.7 % (ref 11.5–15.5)
RDW: 13.4 % (ref 11.5–15.5)
WBC: 6.6 K/uL (ref 4.0–10.5)
WBC: 6.8 K/uL (ref 4.0–10.5)
WBC: 7.1 K/uL (ref 4.0–10.5)
WBC: 7.4 K/uL (ref 4.0–10.5)
WBC: 7.8 10*3/uL (ref 4.0–10.5)
WBC: 9.5 10*3/uL (ref 4.0–10.5)
WBC: 9.5 K/uL (ref 4.0–10.5)

## 2010-12-27 LAB — COMPREHENSIVE METABOLIC PANEL WITH GFR
ALT: 13 U/L (ref 0–35)
AST: 16 U/L (ref 0–37)
Albumin: 2.6 g/dL — ABNORMAL LOW (ref 3.5–5.2)
Alkaline Phosphatase: 52 U/L (ref 39–117)
BUN: 14 mg/dL (ref 6–23)
CO2: 37 meq/L — ABNORMAL HIGH (ref 19–32)
Calcium: 8.6 mg/dL (ref 8.4–10.5)
Chloride: 95 meq/L — ABNORMAL LOW (ref 96–112)
Creatinine, Ser: 1.13 mg/dL (ref 0.4–1.2)
GFR calc non Af Amer: 47 mL/min — ABNORMAL LOW
Glucose, Bld: 152 mg/dL — ABNORMAL HIGH (ref 70–99)
Potassium: 3.8 meq/L (ref 3.5–5.1)
Sodium: 139 meq/L (ref 135–145)
Total Bilirubin: 0.4 mg/dL (ref 0.3–1.2)
Total Protein: 6.6 g/dL (ref 6.0–8.3)

## 2010-12-27 LAB — BLOOD GAS, ARTERIAL
Acid-Base Excess: 9.6 mmol/L — ABNORMAL HIGH (ref 0.0–2.0)
Bicarbonate: 34.2 mEq/L — ABNORMAL HIGH (ref 20.0–24.0)
O2 Saturation: 97.6 %
pO2, Arterial: 115 mmHg — ABNORMAL HIGH (ref 80.0–100.0)

## 2010-12-27 LAB — HEPARIN LEVEL (UNFRACTIONATED)

## 2010-12-27 LAB — LACTATE DEHYDROGENASE, PLEURAL OR PERITONEAL FLUID: LD, Fluid: 233 U/L — ABNORMAL HIGH (ref 3–23)

## 2010-12-27 LAB — POCT I-STAT, CHEM 8
BUN: 24 mg/dL — ABNORMAL HIGH (ref 6–23)
BUN: 28 mg/dL — ABNORMAL HIGH (ref 6–23)
Calcium, Ion: 0.95 mmol/L — ABNORMAL LOW (ref 1.12–1.32)
Calcium, Ion: 1.11 mmol/L — ABNORMAL LOW (ref 1.12–1.32)
Chloride: 95 mEq/L — ABNORMAL LOW (ref 96–112)
Creatinine, Ser: 1 mg/dL (ref 0.4–1.2)
Creatinine, Ser: 1.1 mg/dL (ref 0.4–1.2)
Glucose, Bld: 279 mg/dL — ABNORMAL HIGH (ref 70–99)
Sodium: 135 mEq/L (ref 135–145)
TCO2: 35 mmol/L (ref 0–100)

## 2010-12-27 LAB — CARDIAC PANEL(CRET KIN+CKTOT+MB+TROPI)
CK, MB: 1 ng/mL (ref 0.3–4.0)
CK, MB: 9.3 ng/mL (ref 0.3–4.0)
Relative Index: INVALID (ref 0.0–2.5)
Total CK: 20 U/L (ref 7–177)
Total CK: 83 U/L (ref 7–177)

## 2010-12-27 LAB — POCT CARDIAC MARKERS
Myoglobin, poc: 139 ng/mL (ref 12–200)
Troponin i, poc: <0.05 ng/mL (ref 0.00–0.09)

## 2010-12-27 LAB — BASIC METABOLIC PANEL WITH GFR
BUN: 16 mg/dL (ref 6–23)
BUN: 16 mg/dL (ref 6–23)
CO2: 38 meq/L — ABNORMAL HIGH (ref 19–32)
CO2: 39 meq/L — ABNORMAL HIGH (ref 19–32)
Calcium: 8.7 mg/dL (ref 8.4–10.5)
Calcium: 8.7 mg/dL (ref 8.4–10.5)
Chloride: 95 meq/L — ABNORMAL LOW (ref 96–112)
Chloride: 97 meq/L (ref 96–112)
Creatinine, Ser: 0.96 mg/dL (ref 0.4–1.2)
Creatinine, Ser: 1.22 mg/dL — ABNORMAL HIGH (ref 0.4–1.2)
GFR calc non Af Amer: 43 mL/min — ABNORMAL LOW
GFR calc non Af Amer: 57 mL/min — ABNORMAL LOW
Glucose, Bld: 136 mg/dL — ABNORMAL HIGH (ref 70–99)
Glucose, Bld: 157 mg/dL — ABNORMAL HIGH (ref 70–99)
Potassium: 4 meq/L (ref 3.5–5.1)
Potassium: 4.3 meq/L (ref 3.5–5.1)
Sodium: 139 meq/L (ref 135–145)
Sodium: 141 meq/L (ref 135–145)

## 2010-12-27 LAB — BRAIN NATRIURETIC PEPTIDE: Pro B Natriuretic peptide (BNP): 43 pg/mL (ref 0.0–100.0)

## 2010-12-27 LAB — URINALYSIS, ROUTINE W REFLEX MICROSCOPIC
Ketones, ur: NEGATIVE mg/dL
Nitrite: NEGATIVE
Protein, ur: NEGATIVE mg/dL
pH: 7 (ref 5.0–8.0)

## 2010-12-27 LAB — HEMOGLOBIN A1C
Hgb A1c MFr Bld: 8.5 % — ABNORMAL HIGH
Mean Plasma Glucose: 197 mg/dL — ABNORMAL HIGH

## 2010-12-27 LAB — CK TOTAL AND CKMB (NOT AT ARMC)
Relative Index: INVALID (ref 0.0–2.5)
Relative Index: INVALID (ref 0.0–2.5)
Total CK: 66 U/L (ref 7–177)

## 2010-12-27 LAB — BODY FLUID CELL COUNT WITH DIFFERENTIAL
Neutrophil Count, Fluid: 65 % — ABNORMAL HIGH (ref 0–25)
Total Nucleated Cell Count, Fluid: 3800 cu mm — ABNORMAL HIGH (ref 0–1000)

## 2010-12-27 LAB — DIFFERENTIAL
Basophils Absolute: 0 10*3/uL (ref 0.0–0.1)
Basophils Absolute: 0 10*3/uL (ref 0.0–0.1)
Basophils Relative: 0 % (ref 0–1)
Basophils Relative: 0 % (ref 0–1)
Eosinophils Absolute: 0 10*3/uL (ref 0.0–0.7)
Eosinophils Absolute: 0 10*3/uL (ref 0.0–0.7)
Eosinophils Relative: 0 % (ref 0–5)
Eosinophils Relative: 0 % (ref 0–5)
Lymphocytes Relative: 14 % (ref 12–46)
Monocytes Absolute: 0.6 10*3/uL (ref 0.1–1.0)
Monocytes Absolute: 0.4 10*3/uL (ref 0.1–1.0)

## 2010-12-27 LAB — GLUCOSE, SEROUS FLUID

## 2010-12-27 LAB — PH, BODY FLUID: pH, Fluid: 7.5

## 2010-12-27 LAB — PROTEIN, BODY FLUID: Total protein, fluid: 3.8 g/dL

## 2010-12-27 LAB — BODY FLUID CULTURE: Culture: NO GROWTH

## 2010-12-27 LAB — TROPONIN I: Troponin I: 0.02 ng/mL (ref 0.00–0.06)

## 2010-12-27 LAB — D-DIMER, QUANTITATIVE

## 2010-12-27 LAB — URINE MICROSCOPIC-ADD ON

## 2010-12-27 LAB — CREATININE, URINE, RANDOM: Creatinine, Urine: 69.8 mg/dL

## 2010-12-27 LAB — MRSA PCR SCREENING: MRSA by PCR: NEGATIVE

## 2010-12-27 LAB — SODIUM, URINE, RANDOM: Sodium, Ur: <10 mEq/L

## 2011-01-23 LAB — BASIC METABOLIC PANEL
BUN: 14 mg/dL (ref 6–23)
BUN: 15 mg/dL (ref 6–23)
BUN: 17 mg/dL (ref 6–23)
BUN: 17 mg/dL (ref 6–23)
CO2: 36 mEq/L — ABNORMAL HIGH (ref 19–32)
CO2: 39 mEq/L — ABNORMAL HIGH (ref 19–32)
CO2: 40 mEq/L — ABNORMAL HIGH (ref 19–32)
Calcium: 8.2 mg/dL — ABNORMAL LOW (ref 8.4–10.5)
Calcium: 8.6 mg/dL (ref 8.4–10.5)
Chloride: 91 mEq/L — ABNORMAL LOW (ref 96–112)
Chloride: 92 mEq/L — ABNORMAL LOW (ref 96–112)
Chloride: 92 mEq/L — ABNORMAL LOW (ref 96–112)
Chloride: 93 mEq/L — ABNORMAL LOW (ref 96–112)
Chloride: 96 mEq/L (ref 96–112)
Creatinine, Ser: 0.77 mg/dL (ref 0.4–1.2)
Creatinine, Ser: 0.94 mg/dL (ref 0.4–1.2)
Creatinine, Ser: 0.97 mg/dL (ref 0.4–1.2)
GFR calc Af Amer: 59 mL/min — ABNORMAL LOW (ref >60)
GFR calc Af Amer: >60 mL/min (ref >60)
GFR calc Af Amer: >60 mL/min (ref >60)
GFR calc non Af Amer: 49 mL/min — ABNORMAL LOW (ref >60)
GFR calc non Af Amer: 52 mL/min — ABNORMAL LOW (ref >60)
GFR calc non Af Amer: 57 mL/min — ABNORMAL LOW (ref >60)
Glucose, Bld: 105 mg/dL — ABNORMAL HIGH (ref 70–99)
Glucose, Bld: 171 mg/dL — ABNORMAL HIGH (ref 70–99)
Glucose, Bld: 202 mg/dL — ABNORMAL HIGH (ref 70–99)
Glucose, Bld: 95 mg/dL (ref 70–99)
Potassium: 4.1 mEq/L (ref 3.5–5.1)
Potassium: 4.4 mEq/L (ref 3.5–5.1)
Potassium: 4.7 mEq/L (ref 3.5–5.1)
Sodium: 136 mEq/L (ref 135–145)
Sodium: 139 mEq/L (ref 135–145)

## 2011-01-23 LAB — CARDIAC PANEL(CRET KIN+CKTOT+MB+TROPI)
CK, MB: 1 ng/mL (ref 0.3–4.0)
CK, MB: 1 ng/mL (ref 0.3–4.0)
Relative Index: INVALID (ref 0.0–2.5)
Total CK: 30 U/L (ref 7–177)
Troponin I: <0.01 ng/mL (ref 0.00–0.06)
Troponin I: <0.01 ng/mL (ref 0.00–0.06)

## 2011-01-23 LAB — CBC
HCT: 35.6 % — ABNORMAL LOW (ref 36.0–46.0)
MCV: 80.2 fL (ref 78.0–100.0)
MCV: 80.7 fL (ref 78.0–100.0)
Platelets: 159 10*3/uL (ref 150–400)
Platelets: 174 10*3/uL (ref 150–400)
RDW: 18.4 % — ABNORMAL HIGH (ref 11.5–15.5)
RDW: 18.5 % — ABNORMAL HIGH (ref 11.5–15.5)
WBC: 8 10*3/uL (ref 4.0–10.5)

## 2011-01-23 LAB — DIFFERENTIAL
Basophils Absolute: 0 10*3/uL (ref 0.0–0.1)
Lymphocytes Relative: 12 % (ref 12–46)
Neutro Abs: 7.6 10*3/uL (ref 1.7–7.7)

## 2011-01-23 LAB — COMPREHENSIVE METABOLIC PANEL
BUN: 16 mg/dL (ref 6–23)
CO2: 36 mEq/L — ABNORMAL HIGH (ref 19–32)
Chloride: 97 mEq/L (ref 96–112)
Creatinine, Ser: 0.94 mg/dL (ref 0.4–1.2)
GFR calc non Af Amer: 59 mL/min — ABNORMAL LOW (ref >60)
Total Bilirubin: 0.5 mg/dL (ref 0.3–1.2)

## 2011-01-23 LAB — URINALYSIS, ROUTINE W REFLEX MICROSCOPIC
Bilirubin Urine: NEGATIVE
Nitrite: NEGATIVE
Specific Gravity, Urine: 1.01 (ref 1.005–1.030)
Urobilinogen, UA: 0.2 mg/dL (ref 0.0–1.0)

## 2011-01-23 LAB — GLUCOSE, CAPILLARY
Glucose-Capillary: 112 mg/dL — ABNORMAL HIGH (ref 70–99)
Glucose-Capillary: 131 mg/dL — ABNORMAL HIGH (ref 70–99)
Glucose-Capillary: 137 mg/dL — ABNORMAL HIGH (ref 70–99)
Glucose-Capillary: 166 mg/dL — ABNORMAL HIGH (ref 70–99)
Glucose-Capillary: 170 mg/dL — ABNORMAL HIGH (ref 70–99)
Glucose-Capillary: 195 mg/dL — ABNORMAL HIGH (ref 70–99)
Glucose-Capillary: 196 mg/dL — ABNORMAL HIGH (ref 70–99)

## 2011-01-23 LAB — TROPONIN I: Troponin I: 0.03 ng/mL (ref 0.00–0.06)

## 2011-01-23 LAB — BRAIN NATRIURETIC PEPTIDE: Pro B Natriuretic peptide (BNP): 143 pg/mL — ABNORMAL HIGH (ref 0.0–100.0)

## 2011-01-24 ENCOUNTER — Ambulatory Visit: Payer: Medicare Other | Admitting: Family Medicine

## 2011-02-05 ENCOUNTER — Telehealth: Payer: Self-pay | Admitting: Family Medicine

## 2011-02-05 NOTE — Telephone Encounter (Signed)
I think I did these.  If they did not receive them, could they re-send?

## 2011-02-05 NOTE — Telephone Encounter (Signed)
Checking status of CMN orders faxed to [name] about a month ago. The Initial date was for 01/23/09 & the recert was for  01/23/10.

## 2011-02-05 NOTE — Telephone Encounter (Signed)
Spoke with Selena.  They will re-send.

## 2011-02-07 ENCOUNTER — Ambulatory Visit: Payer: Medicare Other | Admitting: Family Medicine

## 2011-02-19 ENCOUNTER — Ambulatory Visit (INDEPENDENT_AMBULATORY_CARE_PROVIDER_SITE_OTHER): Payer: Medicare Other | Admitting: Family Medicine

## 2011-02-19 VITALS — BP 91/51 | HR 69

## 2011-02-19 DIAGNOSIS — M549 Dorsalgia, unspecified: Secondary | ICD-10-CM

## 2011-02-19 DIAGNOSIS — R109 Unspecified abdominal pain: Secondary | ICD-10-CM

## 2011-02-19 DIAGNOSIS — G2401 Drug induced subacute dyskinesia: Secondary | ICD-10-CM

## 2011-02-19 DIAGNOSIS — R300 Dysuria: Secondary | ICD-10-CM

## 2011-02-19 DIAGNOSIS — Z79899 Other long term (current) drug therapy: Secondary | ICD-10-CM

## 2011-02-19 LAB — POCT URINALYSIS DIPSTICK
Bilirubin, UA: NEGATIVE
Blood, UA: NEGATIVE
Nitrite, UA: NEGATIVE
Spec Grav, UA: <=1.005
pH, UA: 5.5

## 2011-02-19 LAB — POCT UA - MICROSCOPIC ONLY

## 2011-02-19 MED ORDER — ERYTHROMYCIN BASE 250 MG PO TABS: 250.0000 mg | ORAL_TABLET | Freq: Three times a day (TID) | ORAL | Status: DC

## 2011-02-19 MED ORDER — FERROUS SULFATE 325 (65 FE) MG PO TABS: 325.0000 mg | ORAL_TABLET | Freq: Every day | ORAL | Status: DC

## 2011-02-19 MED ORDER — PROMETHAZINE HCL 25 MG PO TABS: 25.0000 mg | ORAL_TABLET | Freq: Four times a day (QID) | ORAL | Status: DC | PRN

## 2011-02-19 MED ORDER — FLUCONAZOLE 150 MG PO TABS: 150.0000 mg | ORAL_TABLET | Freq: Once | ORAL | Status: AC

## 2011-02-19 MED ORDER — METOCLOPRAMIDE HCL 5 MG PO TABS: 5.0000 mg | ORAL_TABLET | Freq: Four times a day (QID) | ORAL | Status: DC

## 2011-02-19 NOTE — Patient Instructions (Signed)
It was great to see you today!  I am glad you are going to your new house. You should have 56 ativans at home, 32 morphine tablets and 34 percocet tablets.  If you do not, we really need to figure out where these medicines have gone.   I will call you if the urine cx shows any problems.  In the meantime, the fluconazole pill should help.

## 2011-02-20 ENCOUNTER — Telehealth: Payer: Self-pay | Admitting: Family Medicine

## 2011-02-20 ENCOUNTER — Encounter: Payer: Self-pay | Admitting: Family Medicine

## 2011-02-20 DIAGNOSIS — R109 Unspecified abdominal pain: Secondary | ICD-10-CM | POA: Insufficient documentation

## 2011-02-20 DIAGNOSIS — Z79899 Other long term (current) drug therapy: Secondary | ICD-10-CM | POA: Insufficient documentation

## 2011-02-20 NOTE — Progress Notes (Addendum)
  Subjective:    Patient ID: Holly Kim, female    DOB: [birth year], 70 y.o.   MRN: [id number]  HPI 70 yo released today from skilled nursing facility and has af/u with me on her way home. Was seen in [place] and admitted to [facility] for atypical chest pain.  She was then transferred to SNF as she has multiple needs.  Today she is moving into her new rental house.    Today, she c/o abdominal pain, consistent with her earlier pain for which she has had an extensive w/u.  She feels this is due to gallbladder.  No fevers or chills.  +chronic constipation with daily BM but feeling of incomplete evacuation. +daily nausea - which improves with her daily phenergan.  No vomiting.    Pt also with urinary frequency, external burning, vaginal burning and throbbing, feeling of incomplete evacuation of urine.  She denies having a urinary catheter during hospitalization.    Reports her dyspnea is at baseline.  Denies chest pain currently.   Review of Systems SeeHPI     Objective:   Physical Exam Obese female in wheelchair. +pallor CV: Distant heart sounds, but RRR no m/g/r appreciated Lungs: CTAB with poor inspiratory effort. Ext: Trace edema BLE Psych: Pt in her normal state of grooming and dress.  Thought process and content normal.  Eager to get to her house that [name] has fixed up for her.   Abd:  Soft.  TTP in RLQ, RUQ.  No mass or HSM appreciated, but exam severely limited by body habitus.  + BS       Assessment & Plan:  Pt with >100K CFU enterococcus.  WIll rx with cipro as pt allergic to amox.

## 2011-02-20 NOTE — Assessment & Plan Note (Signed)
There have been concerns about possible diversion of Holly Kim's meds, which include lorazepam, Holly Contin and percocet.  Controlled substance database reveals the following: Percocet 10/325 60 tabs filled 11/26/10 Holly contin 15 mg 60 tabs filled 11/25/10 Ativan 0.5 mg 60 tabs filled 12/07/10. Holly Kim has been in the hospital or in the SNF from 12/09/10 until today.  By my count, she should still have 34 percocets, 32 Holly contins and 56 ativan tablets.  When I mentioned this to her, she states that she does not know where things are because everything is packed away.  I informed her that we need for her to find those medicines because we cannot fill them early. I discussed with [name] and asked them to hold the current prescriptions they have for the percocet and Holly contin until May 24th.  Holly Kim also received prescriptions at discharge for percocet (60 tabs), Holly contin (60 tabs) and ativan (30 tabs).  I advised her not to use those scripts, but she did not want to destroy those while in clinic, so they are still in her possession.

## 2011-02-20 NOTE — Assessment & Plan Note (Signed)
Pt with abdominal pain that is constant for months, and is consistent with her previous pain for which a work up was negative except for gallstones without evidence of cholecystitis.  UA with small leuks, collect from urine hat.  Will await cx.  Will try fluonazole x 1 for vaginal burning. Vaginal Exam is too difficult with pt's mobility issues.  F/u 2 weeks

## 2011-02-20 NOTE — Assessment & Plan Note (Signed)
Pt reports continued back pain and wants to be sure her narcotics are filled.

## 2011-02-20 NOTE — Telephone Encounter (Signed)
Please fax back the Certificate of Med Necessity for oxygen on Ms. Halls to [facility] @ [phone] when completed

## 2011-02-21 ENCOUNTER — Telehealth: Payer: Self-pay | Admitting: Family Medicine

## 2011-02-21 ENCOUNTER — Other Ambulatory Visit: Payer: Self-pay | Admitting: Family Medicine

## 2011-02-21 DIAGNOSIS — H547 Unspecified visual loss: Secondary | ICD-10-CM

## 2011-02-21 DIAGNOSIS — Z7409 Other reduced mobility: Secondary | ICD-10-CM

## 2011-02-21 DIAGNOSIS — I509 Heart failure, unspecified: Secondary | ICD-10-CM

## 2011-02-21 DIAGNOSIS — I2510 Atherosclerotic heart disease of native coronary artery without angina pectoris: Secondary | ICD-10-CM

## 2011-02-21 NOTE — Telephone Encounter (Signed)
Patient was recently discharged from the nursing home, pt is requesting home health with [facility] for someone to come out and fill her insulin syringes & pill box.

## 2011-02-21 NOTE — Telephone Encounter (Signed)
[facility]- called to say that pt called them to say she needs home health care and incontinence supplies. Sharon's number is [phone] fax# [phone]

## 2011-02-21 NOTE — Telephone Encounter (Signed)
I just put in an order for this.  Could you send to [facility], attention [facility] fax [phone]

## 2011-02-21 NOTE — Telephone Encounter (Signed)
Done

## 2011-02-22 LAB — URINE CULTURE: Colony Count: >=100000

## 2011-02-22 NOTE — Telephone Encounter (Signed)
Pt checking status of home health referral, anxious to get RN out to her house.

## 2011-02-25 NOTE — Telephone Encounter (Signed)
Referral has been faxed to [facility] [phone]

## 2011-02-25 NOTE — Telephone Encounter (Signed)
Any word from [facility]?

## 2011-02-26 ENCOUNTER — Telehealth: Payer: Self-pay | Admitting: Family Medicine

## 2011-02-26 DIAGNOSIS — Z7409 Other reduced mobility: Secondary | ICD-10-CM

## 2011-02-26 NOTE — Telephone Encounter (Signed)
Order faxed to [facility], [phone].Loralee Pacas [place]

## 2011-02-26 NOTE — Telephone Encounter (Signed)
Spoke with pt.  She has an Charity fundraiser from [employer] care who helps with medicines (fill pill box, fill insulin syringes) and an aide from [employer] who helps with laundry, bathing, meal prep.  Order has been placed for [facility] as well.

## 2011-02-26 NOTE — Telephone Encounter (Signed)
Pt calling again today - Holly Kim has not heard from [name] yet and she needs this asap.

## 2011-02-26 NOTE — H&P (Signed)
NAME:  Holly Kim, Holly Kim NO.:  [id number]   MEDICAL RECORD NO.:  [id number]          PATIENT TYPE:  OBV   LOCATION:  6743                         FACILITY:  [facility]   PHYSICIAN:  Wayne A. Sheffield Slider, M.D.    DATE OF BIRTH:  [birth year]   DATE OF ADMISSION:  01/17/2009  DATE OF DISCHARGE:                              HISTORY & PHYSICAL   PRIMARY CARE Torsten Weniger:  Dr. Sarah Swaziland at [facility].   CHIEF COMPLAINT:  Shortness of breath.   HISTORY OF PRESENT ILLNESS:  History provided by the patient and sister.  Shortness of breath.  The patient has a history of obesity,  hypoventilation syndrome, and pleural effusions, and was last  hospitalized in February 2008.  She presents with shortness of breath,  worse over the past 2 days.  The patient called into the clinic earlier  today complaining of shortness of breath and swelling in her legs.  The  patient has used O2 at home before but was not using it recently.  Her  sister let her use her oxygen of 2 liters yesterday and that helped with  her breathing.  The patient states that her breathing has been getting  worse over the past couple months and has had multiple episodes where  she felt short of breath.  For past couple of days, it has been getting  slightly worse.  Chest x-ray in the ED showed possible left lower lobe  infiltrate.  In the ED, the patient was given ceftriaxone and  azithromycin.  At the time of evaluation, the patient had no complaints  and wanted to go home.  She was able to maintain her sats on room air  for the duration of the encounter.  The patient denies any chest pain,  nausea, vomiting, fevers.  The patient does endorse worsening lower  extremity swelling over the past week.   PAST MEDICAL HISTORY:  1. Coronary artery disease status post stents.  2. Fatigue.  3. GERD.  4. Chronic back pain.  5. Hypertension.  6. Diabetes mellitus type 2.  7. Hypercholesterolemia.  8.  Depressive disorder.  9. History of pleural effusion.  10.Peripheral neuropathy.  11.Cerebrovascular disease.  12.Borderline personality  13.History of deep vein thrombophlebitis.  14.Obesity.   MEDICATIONS:  1. Lantus 55 units injected subcutaneously once a day.  2. Lasix 40 mg p.o. b.i.d.  3. Neurontin 600 mg 1 tablet p.o. t.i.d.  4. Nystatin cream apply to affected areas.  5. Percocet 7.5/325 mg 1 tablet p.o. t.i.d.  6. Prilosec 20 mg 1 tablet p.o. daily.  7. Regular human insulin inject 15 units subcutaneously 3 times a day.  8. Zocor 20 mg 1 tablet by mouth daily.  9. MS Contin 15 mg 1 tablet by mouth two times a day.  10.Flonase 50 mcg 2 squirts in each nostril daily for 1 month.  11.Aspirin 81 mg 1 tablet by mouth daily.  12.Lorazepam 0.5 mg 1 tablet b.i.d. as needed for anxiety.   ALLERGIES:  1. AMOXICILLIN.  2. CODEINE.  3. CORTISONE.  4. HALDOL.  5. NORVASC.   SOCIAL HISTORY:  The patient lives with her son.  Lives near her sister  who also take care of her.  The patient is able to complete minimal ADLs  and is mostly wheelchair bound.  The patient has been in the nursing  home twice in the past.  The patient denies any smoking, alcohol, or  illicit drug use.   PHYSICAL EXAMINATION:  VITAL SIGNS:  Temperature 98.5, pulse 70,  respiratory rate 22, blood pressure 151/48, pulse ox greater than 96% on  room air.  GENERAL:  Obese, in no acute distress, breathing comfortably  on room air.  HEAD:  Normocephalic, atraumatic.  MOUTH:  Tardive dyskinesia.  NECK:  No JVD.  LUNGS:  Bilateral crackles up to mid lungs.  HEART:  Normal rate and regular rhythm.  ABDOMEN:  Soft and nontender.  SKIN:  Multiple areas of yeast rash under breasts and pannus.  Skin  ulcer on left leg covered by dressing. Skin redness on buttocks, but no  skin breakdown.   LABS AND STUDIES:  1. Chest x-ray:  Stable moderate cardiomegaly.  Infiltrate in left      lower lobe, hazy opacity in  inferior left hemithorax.  2. Point-of-care cardiac enzymes negative.  3. Urinalysis, negative.  4. CBC:  WBC 8.9, hemoglobin 11.4, platelets 174.  5. BMET:  Sodium 138, potassium 4.5, chloride 97, CO2 of 36, glucose      203, BUN 16, creatinine 0.94.  6. BNP 143, baseline 141.  7. Glucose 137.   ASSESSMENT AND PLAN:  The patient is a 70 year old female with shortness  of breath.  1. Shortness of breath.  Differential diagnoses includes pneumonia,      congestive heart failure, pulmonary embolism, obesity,      hypoventilation syndrome.  Per the workup for pneumonia, the      patient does have a questionable infiltrate on chest x-ray and      possibly increased oxygen requirement.  The patient received      ceftriaxone and azithromycin in the ED.  The patient is not      currently complaining of shortness of breath.  The patient does not      have a fever or elevated white count.  We will not continue      antibiotics at this point, if the patient spikes a fever or becomes      more short of breath, we will restart.  We will need a chest x-ray      PA and lateral to evaluate for congestive heart failure.  The      patient's BNP appeared to be at her baseline.  There is no definite      signs of vascular congestion on chest x-ray.  The patient has been      hospitalized in the past with similar complaint and resolved with      diuresis.  We will give Lasix 40 mg IV two times a day and monitor      strict in and out.  The patient does not produce adequate urine      output, we will increase.  We will order 2-D echo.  We will cycle      cardiac enzymes to rule out myocardial infraction.  Pulmonary      embolism remains low on the differential.  The patient is slightly      tachypneic but is not tachycardic.  She denies any pleuritic chest      pain and has been maintaining her sats  well on room air.  We will      hold off on further imaging for now.  2. Diabetes mellitus type 2.  We  will continue her home meds plus      monitor sliding scale insulin.  3. Hypertension.  Not currently on any blood pressure medications.  We      will monitor throughout her hospital stay.  4. Fungal dermatitis.  We will use nystatin powder.  5. Chronic back pain.  Continue Percocet and MS Contin.  6. Anxiety.  Ativan 0.5 mg b.i.d. p.r.n. for anxiety.  7. Peripheral neuropathy.  Continue Neurontin.  8. Fluids, electrolytes, nutrition/gastrointestinal.  Low-sodium,      heart healthy, carb-modified diet.  9. Prophylaxis.  Heparin 5000 units subcutaneously 3 times a day.  10.Disposition.  Pending clinical improvement.      Angelena Sole, MD  Electronically Signed      Arnette Norris. Sheffield Slider, M.D.  Electronically Signed    WS/MEDQ  D:  01/17/2009  T:  01/18/2009  Job:  [id number]

## 2011-02-26 NOTE — Telephone Encounter (Signed)
Order faxed.Holly Kim Lynetta  

## 2011-02-26 NOTE — Discharge Summary (Signed)
Holly Kim, Holly Kim NO.:  [id number]   MEDICAL RECORD NO.:  [id number]          PATIENT TYPE:  INP   LOCATION:  6743                         FACILITY:  [facility]   PHYSICIAN:  Wayne A. Sheffield Slider, M.D.    DATE OF BIRTH:  [birth year]   DATE OF ADMISSION:  01/17/2009  DATE OF DISCHARGE:  01/23/2009                               DISCHARGE SUMMARY   DISCHARGE DIAGNOSES:  1. Dyspnea.  2. Obesity hypoventilation syndrome.  3. Diastolic congestive heart failure.  4. Diabetes mellitus type 2.  5. Chronic back pain.  6. Gastroesophageal reflux disease.  7. Peripheral neuropathy.  8. Constipation.  9. Hyperlipidemia.  10.History of deep vein thrombosis.  11.Coronary artery disease status post stents.  12.Morbid obesity.  13.Borderline personality disorder.  14.Depression.  15.Lower extremity skin ulcer.   DISCHARGE MEDICATIONS:  1. Torsemide 20 mg by mouth twice daily.  2. Neurontin 600 mg by mouth three times daily.  3. Nystatin cream apply to affected area twice daily.  4. Prilosec OTC 20 mg daily.  5. ReliOn regular insulin 15 units subcutaneous three times daily.  6. Zocor 20 mg each night.  7. MS Contin 15 mg twice daily.  8. Percocet 7.5/325 mg three times daily.  9. Flonase 50 mcg 2 squirts each nostril daily.  10.Aspirin 81 mg daily.  11.Zoloft 50 mg 1 tablet daily.  12.Ativan 0.5 mg twice daily as needed for agitation or anxiety.  13.Lantus 55 units daily.  14.Reglan 10 mg by mouth three times daily prior to meals.  15.Senna 8.6 mg to give 2 tablets by mouth daily as needed for      constipation.  16.K-Dur 20 mEq by mouth daily.  The patient was instructed to stop      taking Lasix.   PROCEDURES:  1. Chest x-ray on January 17, 2009.  Impression:      a.     Cardiac enlargement      b.     Left effusion with overlying atelectasis.  2. Chest x-ray on January 21, 2009.  Impression:  No acute change from      previous chest x-ray.  3. VQ scan January 18, 2009.   Impression:      a.     Negative ventilation-perfusion lung scan for pulmonary       embolism.      b.     Cardiac enlargement.  4. Air trapping consistent with COPD.  5. An echo on January 18, 2009.  Summary:  Overall left ventricular      systolic function was at the lower limits of normal.  Left      ventricular ejection fraction was estimated to be 55%.  There were      no left ventricular regional wall abnormalities.  6. Lower extremity Doppler on January 19, 2009.  Impression:  Bilateral      lower extremity duplex with no obvious evidence of DVT, superficial      thrombosis, or Baker's cyst.   LABS UPON DISCHARGE:  Sodium 142, potassium 4.7, chloride 92, CO2 42,  glucose 94, BUN 19, creatinine 1.05, calcium 9.0.  Cardiac  enzymes were  negative x3.  TSH was 1.148, BNP on January 17, 2009 was 143.  Urinalysis  on January 17, 2009 was negative.  Upon admission white blood cell count  8.9, hemoglobin 11.4, hematocrit 35.6, platelets 174,000.   BRIEF HOSPITAL COURSE:  Please see H and P for details of admission.  Briefly, this is a 70 year old lady with a past medical history of  obesity hypoventilation syndrome, diastolic CHF and chronic left pleural  effusion who presented with dyspnea and a 1-week history of increased  lower extremity edema.   PROBLEM LIST:  1. Dyspnea.  This was thought to be secondary to obesity      hypoventilation syndrome, diastolic CHF, as well as chronic left      pleural effusion.  Differential included and pneumonia and PE.      Pneumonia was thought to be unlikely as the patient was afebrile      with a low white count upon admission.  She also had no evidence of      infiltrate on chest x-ray.  Because the patient was at high risk      for a pulmonary embolism with risks including history of DVT, a      sedentary lifestyle, and lower extremity ulcers, PE was ruled out      with negative lower extremity Dopplers and a negative VQ scan.  The      patient also had  no clear pleuritic pain and maintained O2      saturations on 2 liters nasal cannula during her stay.  The patient      diuresed well first with IV Lasix and then with p.o. torsemide.      The patient was previously on Lasix p.o. but was switched to      torsemide as this medication has better bioavailability.  She      maintained her O2 sats on 2 liters nasal cannula and was discharged      home with home health to acquire home O2 for the patient.  It was      noted that the patient had home O2 at one time but then somehow she      discontinued it on her own.  The patient had decreased lower      extremity edema as well as resolved dyspnea upon discharge.  She      will follow up with her primary care physician and we recommend      that she be sent for a split-night study for CPAP on an outpatient      basis.  We suspect that many of her problems were secondary to      increased pulmonary pressures due to her obesity.  2. Diabetes.  The patient has type 2 diabetes.  She was continued with      her home dose of Lantus and sliding scale insulin and discharged on      her home medications.  3. Borderline personality/anxiety and depression.  The patient      required a female physician.  She was maintained on her home      regimen of Ativan twice daily as needed for agitation and anxiety.  4. Peripheral neuropathy.  The patient did on exam have no sensation      from her toes to her knees and did complain of lower extremity      numbness and tingling.  She was maintained on her home dose of  Neurontin throughout her stay.  5. Chronic back pain.  The patient did complain of pain throughout her      stay.  She was maintained on her previous doses of MS Contin 50 mg      twice daily and Percocet 7.5/325 mg three times daily.  The patient      did request a refill of her medications upon discharge, but upon      investigation, she was found to have a prescription previously      written  and that was not available to be filled until next week.      She was told that she could follow up with primary care doctor      regarding these medications.  6. Constipation.  The patient did complain of some constipation during      the second day of her stay, stating that she regularly has a bowel      movement every day and she was started on senna daily and provided      a Fleet's enema if needed, which she declined.  It was found that      the patient was previously on Reglan.  After discussion with the      patient regarding Reglan and side effects, including her already      present tardive dyskinesia, the patient decided that she would      prefer to be on Reglan at this point, so this medication was      restarted in the hospital and she was discharged with a      prescription for it.  7. Skin.  The patient had 3 areas that required evaluation, the first      on her left anterior shin, a healing wound that was being      previously treated appropriately.  She also had a right great toe      redness that seemed to be beginning of an ulcer that should be      watched and some staple redness.  Wound care was consulted and made      recommendations.  Home health was consulted to get a scooter      cushion for the patient for a decubitus ulcer prevention.   PLAN:  The patient will follow up with her primary care physician, Dr.  Swaziland on Thursday, April 15th at 11 a.m.  Follow up issues include a  weight check and creatinine monitoring as well as pain control.  Please  note that her discharge weight was 143 kg.  The patient was discharged  home with home health PT/OT, home oxygen and also scooter cushion.      Helane Rima, MD  Electronically Signed      Arnette Norris. Sheffield Slider, M.D.  Electronically Signed    EW/MEDQ  D:  01/25/2009  T:  01/25/2009  Job:  [id number]   cc:   Dr. Swaziland

## 2011-02-26 NOTE — Telephone Encounter (Signed)
Holly Kim need you to put in order for a Home Health aide thru [facility].  She said she has always need an aide and now they have to receive a new order to send her one.

## 2011-02-27 MED ORDER — CIPROFLOXACIN HCL 500 MG PO TABS: 500.0000 mg | ORAL_TABLET | Freq: Two times a day (BID) | ORAL | Status: AC

## 2011-02-27 NOTE — Progress Notes (Signed)
Addended by: Swaziland, SARAH on: 02/27/2011 01:16 PM   Modules accepted: Orders

## 2011-03-01 NOTE — Op Note (Signed)
NAME:  Holly Kim, Holly Kim                   ACCOUNT NO.:  [id number]   MEDICAL RECORD NO.:  [id number]                   PATIENT TYPE:  INP   LOCATION:  5004                                 FACILITY:  [facility]   PHYSICIAN:  Burnard Bunting, M.D.                 DATE OF BIRTH:  02-15-1941   DATE OF PROCEDURE:  04/27/2003  DATE OF DISCHARGE:                                 OPERATIVE REPORT   PREOPERATIVE DIAGNOSIS:  Right open angle fracture dislocation.   POSTOPERATIVE DIAGNOSIS:  Right open angle fracture dislocation.   OPERATION:  1. Repeat incision and drainage with debridement of skin, subcutaneous     tissue, muscle and bone.  2. Open reduction and internal fixation of lateral malleolar fracture.  3. Open reduction and internal fixation of syndesmosis.   SURGEON:  Burnard Bunting, M.D.   ANESTHESIA:  General endotracheal   ESTIMATED BLOOD LOSS:  50 cc.   DRAINS:  None.   TOURNIQUET TIME:  Ankle Esmarch utilized for approximately 50 minutes.   DESCRIPTION OF PROCEDURE:  The patient was brought to the operating room  where general endotracheal anesthesia was induced.  IV antibiotics were  continued.  Right leg was initially prepped with Hibiclens and saline and  draped in a sterile manner.  The retention sutures were released and the  joint was again irrigated.  Debridement of devitalized skin, subcutaneous  tissue, muscle, fascia and bone was again performed.  Overall following  debridement, the joint appeared intact.  Skin was perfused distal to the  transverse laceration of the open fracture.  At this time, the ankle was  reduced and the skin was closed using a combination of near-far-far-near  nylon sutures.  A flat Blake drain was placed.  At this time an impervious  stockinette was placed over the foot and the foot was prepped with DuraPrep  solution and then draped in a sterile manner.  Operative field was covered  with Ioban.  Ankle Esmarch was utilized at the mid  calf level.  Incision was  made beginning at the inferior tip of the malleolus proximally for about 12  to 13 cm.  Skin and subcutaneous tissue was sharply divided and the anterior  branch of the peroneal nerve was identified and retracted anteriorly.  Fracture site was visualized.  Periosteal elevation was performed and the  fracture was then reduced and held in position with reduction clamps and a  one-third tubular locking plate was applied.  Good reduction and restoration  of length of the fibula was achieved.  Good stable fixation was achieved.  A  small posterior butterfly fragment was also lagged into position following  plate fixation.  At this time, the syndesmosis was reduced under  fluoroscopic guidance.  Fluoroscopy was used to confirm correct placement of  the screws in the AP and lateral plane beginning posterior lateral and  aiming anterior medial syndesmotic screw was placed with bicortical purchase  achieved.  At this time, the ankle Esmarch was released.  Bleeding points were controlled using electrocautery.  The incision was  thoroughly irrigated and closed using interrupted inverted 3-0 Vicryl  suture, followed by skin staples.  The patient was placed into a bulky well  padded posterior splint.  She tolerated the procedure well without immediate  complications.                                               Burnard Bunting, M.D.    GSD/MEDQ  D:  04/27/2003  T:  04/27/2003  Job:  [id number]

## 2011-03-01 NOTE — Discharge Summary (Signed)
NAMEPATTE, WINKEL NO.:  [id number]   MEDICAL RECORD NO.:  [id number]          PATIENT TYPE:  INP   LOCATION:  2036                         FACILITY:  [facility]   PHYSICIAN:  Johney Maine, M.D.   DATE OF BIRTH:  1941-01-11   DATE OF ADMISSION:  12/07/2006  DATE OF DISCHARGE:  12/12/2006                               DISCHARGE SUMMARY   ADMITTING DIAGNOSES:  1. Shortness of breath.  2. Diabetes.  3. Depression/borderline personality.  4. Hypertension.  5. History of deep venous thrombosis.  6. Pulmonary effusions, pulmonary edema.   DISCHARGE DIAGNOSES:  1. Bilateral pleural effusions.  2. Obesity hypoventilation syndrome.  3. Diabetes.  4. Hypertension.  5. Depression/borderline personality.  6. History of deep venous thrombosis.   ATTENDING PHYSICIAN:  Santiago Bumpers. Hensel, M.D.   HOSPITAL COURSE:  Please see H&P for details of admission, however this  is a 70 year old female who presented to the emergency room with a three-  to-four-day history of difficulty breathing.  It was noted that she was  hypoxic when she was admitted.  She has no previous history of this  hypoxia.  Initially, we were thinking this could be a pneumonia or CHF  component.  During her hospital stay, we realized that it was not a  pneumonia and instead she had bilateral pleural effusions which improved  with diuresis.  We also diagnosed her with obesity hypoventilation  syndrome given that her ABGs were abnormal, she was hypercarbic, however  the pH was relatively normal and we feel this is an ongoing process.  Please see the following for details.   1. Shortness of breath/bilateral effusions/pulmonary edema:  This      patient did not have a pneumonia found and she remained afebrile      though her hospital stay with a normal white count.  However, she      did have bilateral pleural effusions that did improve during her      hospital stay.  We increased her diuresis throughout  her hospital      stay and increased it even more on day of discharge in order to      help her breathing.  We were concerned that this patient had a PE      given that she has a history of DVT and PE.  Her CT scan showed no      large pulmonary embolus, but cannot rule out small embolus.  We did      anticoagulate her on heparin until the day prior to discharge given      this finding.  She did have an elevated D-dimer and she was      hypoxic.  However, she did not have chest pain and remained with a      normal heart rate.  We felt that we had a reason for the hypoxia      given her obesity hypoventilation and her bilateral effusions and      on day of discharge we no longer considered that she had a      pulmonary embolism.  Please note that her Doppler of  lower      extremities were negative.  On day of discharge, she was discharged      with Lasix to help with diuresis and the pleural effusions and      instructed to follow up in lab with a B-Met next week and then with      Dr. Cleophas Dunker, her primary physician.  Please note that during this      hospital stay on February 27, the patient became lethargic and      nearly unresponsive.  She did not awaken to sternal chest rub and      did not flinch per ABG.  We were very concerned that she had a      stroke at this time.  Her head CT was negative.  She was mildly      hypoglycemic with a CBG of 56, however did not improve with      dextrose.  She was found lethargic and unresponsive after receiving      her normal dose of MS-Contin and we believe that she had responded      to the MS-Contin along with just her hypoventilation disorder which      tipped her over into this unresponsive state.  We will never know      the details, however we know it was not cardiac in nature and it      was not a stroke.  The patient did perk up a little bit when      Norocaine was given, however this did not resolve her symptoms      completely.  For  whatever reason, she did spontaneously wake up      back to her baseline at the time of the head CT and has been normal      since.  She has had no further episodes of this unresponsiveness      and she does not remember this episode at all, in fact, she barely      remembers the entire day.  On day of discharge, we are attributing      this unresponsive episode to the MS-Contin dose and we have held      her MS-Contin since then.  Her primary care Lyn Deemer, Dr. Cleophas Dunker,      can discuss whether she wants to resume it, but hopefully at a      lower dose.  There is possibly a psychogenic component of this      episode, however we do not know.  It was noted during this hospital      stay that Ms. Rickel could not keep her oxygenation saturations      above 88% and; therefore, met the requirements for home oxygen.      Case manager was involved and we discharged her home with home      oxygen.  This may change at some point as her effusions and      pulmonary edema improve, however she most likely will need oxygen      for the remainder of her life.  2. Diabetes:  The patient's sugars for the most part remained elevated      except for a couple of hypoglycemic episodes.  We kept her on her      home dose of Lantus which is 50 units subcutaneous q.a.m. and a      sliding scale.  She tolerated this well and we discharged her home      on  her home medications which I will dictate shortly.  3. Hypertension:  We held this patient's Norvasc because she was      hypotensive or normotensive with low blood pressures throughout the      hospital stay.  Hopefully, her primary care Cyla Haluska will restart      it once she has a blood pressure check.  We did continue her      Cozaar, however this was reduced as well given her pressures on      admission throughout her hospital stay and she is on 50 mg one      tablet daily.  In addition, we did increase her Lasix to help with     some diuresis of her  lung edema.  4. Borderline personality/depression:  We continued Ms. Fierro's      Zyprexa.  She remained essentially without any psychiatric episodes      except for the questionable unresponsive episode.  5. History of DVT:  Please see above shortness of breath paragraph for      information, however we did anticoagulate Ms. Sawka on heparin      until two days prior to discharge.  We do not feel the hypoxia was      related to pulmonary embolism.  Please note that we did discuss      whether this patient needs long-term anticoagulation given her      history and her immobility; however, at rounds, we decided to hold      this decision and her primary care physician can decide this.      There is a known nidus for the PE, as she had it after an ankle      fracture.  She is immobile, however PT/OT is working with her.      This is something to consider in the future.  6. Arthritis:  We did stop the patient's MS-Contin due to an      unresponsive respiratory depressive episode.  We did resume her      Vicodin one pill q.4-6.h. p.r.n. pain.  If you do start the MS-      Contin, please consider a slow titration.   DISCHARGE MEDICATIONS:  1. Lasix 80 mg p.o. b.i.d. until appointment with Dr. Cleophas Dunker on March      11.  2. Cozaar 50 mg one tab p.o. daily.  3. Lantus 50 units subcutaneous q.a.m.  4. Regular insulin 6 units with meals.  5. MiraLax one packet daily.  6. Iron sulfate one pill p.o. daily.  7. Vicodin 5/500 one tab q.4-6 h. p.r.n. pain, do not take more than      eight a day.  8. Reglan 10 mg one tab p.o. with meals and q.h.s.  9. Prilosec 20 mg p.o. daily.  10.Zocor 20 mg p.o. daily.  11.Zyprexa 2.5 mg one pill p.o. at night.  12.Neurontin 600 mg one pill b.i.d.  13.Multivitamin daily.   Please note that prescriptions were given for the Lasix, Cozaar, Lantus  insulin, Zocor and Zyprexa per patient's request and these were filled  via [email or web address].   DISCHARGE  INSTRUCTIONS:  The patient will follow up as scheduled with  Dr. Cleophas Dunker in the [facility] on March 11 at 1:45 p.m.  She  was also instructed and given an order for a B-Met at [facility] for next week to assess her creatinine and potassium.   Patient was instructed to stop taking Norvasc until she could see  Dr.  Cleophas Dunker for an evaluation of her blood pressures as they have remained  low throughout this hospital stay and she will continue to have  diuresis.   DISCHARGE FOLLOWUP FOR PHYSICIAN:  Dr. Cleophas Dunker, please note the above  changes to medication.  We increased her Lasix until the patient will  see you due to her pulmonary edema.  We also decreased her Cozaar to 50  and held her Norvasc due to hypotension at times during her hospital  stay, otherwise her medications remain the same except for we stop her  MS-Contin.  Please note that a B-Met will be drawn on the week of March 3 to assess her creatinine and potassium and her Lasix should be  adjusted based on those values.  Her creatinine and potassium have  remained all within normal limits throughout her hospital stay with  diuresis and we felt that she could tolerate this.   IMAGES:  Patient had multiple chest x-rays which showed bilateral  effusions and pulmonary edema, left greater than right, however  increased aeration and decreased edema by day of discharge.   Head CT:  No acute processes.   A 2-D echo which showed ejection fraction of 65%, minimal pericardial  effusion, but no hemodynamic compromise.   CT of the chest showed no large pulmonary embolus and could not assess  small vessels.   CONSULTS:  None.   PERTINENT LABS:  I dictated some of these already, but an elevated D-  dimer of 1.63, blood cultures remain negative and no growth to date,  troponins remain within normal limits, both at the initiation of her  hospital stay and during her unresponsive episode, a lipid profile was  within  normal limits with an LDL of 87 which is normal and HDL of 43  which is normal, total cholesterol 148.  Patient's CBC remained normal  throughout her entire stay including white cells and hemoglobin, her INR  was 1.1, her hemoglobin A1c was 5.6, her BNP on admission was mildly  elevated at 141, her electrolytes on day prior to discharge were within  normal limits including a potassium of 3.8.  Please note several ABGs  were done, the patient is hypercarbic with CO2 in the 80s, her last pH  was within  normal limits at 7.362, CO2 of 80.8, bicarb of 44.8, when calculated out  this appears to be a chronic problem, but the pH has not changed and she  is compensating correctly for that.  Please note her last B-Met also  showed elevated bicarb at 44.           ______________________________  Johney Maine, M.D.     JT/MEDQ  D:  12/12/2006  T:  12/13/2006  Job:  [id number]   cc:   Melina Fiddler, MD  Santiago Bumpers. Leveda Anna, M.D.

## 2011-03-01 NOTE — Discharge Summary (Signed)
NAME:  NESSA, RAMAKER                   ACCOUNT NO.:  [id number]   MEDICAL RECORD NO.:  [id number]                   PATIENT TYPE:  OBV   LOCATION:  5022                                 FACILITY:  [facility]   PHYSICIAN:  Santiago Bumpers. Hensel, M.D.             DATE OF BIRTH:  07-20-1941   DATE OF ADMISSION:  10/04/2003  DATE OF DISCHARGE:                                 DISCHARGE SUMMARY   MEDICATIONS AT DISCHARGE:  1. Humulin 70/30 -- 16 units q.a.m. and 40 units q.p.m.  2. Risperdal 0.5 mg p.o. daily.  3. Folic acid 1 mg p.o. daily.  4. Protonix 40 mg p.o. daily.  5. Ascorbic acid 500 mg p.o. daily.  6. Nu-Iron 150 mg p.o. b.i.d.  7. Gabapentin 300 mg two tablets p.o. b.i.d.  8. Alprazolam 25 mg one p.o. t.i.d. p.r.n. anxiety.  9. Senokot two p.o. nightly.  10.      Trazodone 50 mg three p.o. nightly.  11.      Tegretol 100 mg p.o. daily.  12.      Lipitor 40 mg p.o. nightly.  13.      Combivent two puffs four times daily with spacer.  14.      Lasix 40 mg p.o. daily.  15.      Coumadin 2 mg p.o. daily.  16.      Darvocet-N 100 one p.o. q.6 h. p.r.n.  17.      She will also resume sliding-scale insulin as well as PT and     [facility] p.r.n. orders.   DIAGNOSES AT DISCHARGE:  1. Diabetes mellitus, type 2.  2. Hypercholesterolemia.  3. Obesity.  4. Borderline personality disorder.  5. Depression.  6. Peripheral neuropathy.  7. Hypertension.  8. Osteoarthritis.  9. Chronic anemia.  10.      Ischemic cardiomyopathy.  11.      Gastroesophageal reflux disease.  12.      Bronchospasm.  13.      Chronic renal insufficiency.   FOLLOWUP:  She is discharged with followup by Dr. Maxwell Caul at  [facility].   DIET AND ACTIVITY:  She is to adhere to a low-carb, low-salt diet and resume  activity as tolerated, PT.   HOSPITAL COURSE:  Ms. Hellickson is a 70 year old white female who presented  on October 04, 2003 from her nursing home for disorientation.  Specifically, the patient was unable to hold a telephone and twitching her  arms and face.  The patient only states that she is confused and there is no  other input.  Please see admission H&P for further details.   EXAM ON ADMISSION:  VITAL SIGNS:  The patient's temperature was 101.1,  respiratory rate was 18, pulse 83, blood pressure ranged between 121/60 and  144/25.  She was 97% on room air.   LABORATORIES ON ADMISSION:  Her white count was 13.6, hemoglobin 12.1,  hematocrit 36, platelet count 209,000.  INR  was 2.0.  Tegretol level was  7.7.  UA showed specific gravity of 1.013, small amount of blood, small  leukocyte esterase, 3 to 6 white blood cells, 0 to 2 red blood cells, a few  bacteria, a few yeast, granular and hyaline casts; the patient does have an  indwelling Foley.   Chest x-ray showed left lower lobe atelectasis versus consolidation.   The patient was admitted and observed overnight.  On the 22nd, a repeat  chest x-ray showed no consolidation or atelectasis and by the 23rd, the  patient was back at her baseline mental status and level of functioning.  She will return to [facility], where Dr. Leanord Hawking will take over her  care.      Maylon Peppers Waynette Buttery, M.D.                     William A. Leveda Anna, M.D.    SAG/MEDQ  D:  10/06/2003  T:  10/06/2003  Job:  [id number]

## 2011-03-01 NOTE — H&P (Signed)
   NAME:  NOVIS, LEAGUE NO.:  [id number]   MEDICAL RECORD NO.:  [id number]                   PATIENT TYPE:  INP   LOCATION:  5004                                 FACILITY:  [facility]   PHYSICIAN:  Burnard Bunting, M.D.                 DATE OF BIRTH:  08-27-1941   DATE OF ADMISSION:  04/25/2003  DATE OF DISCHARGE:                                HISTORY & PHYSICAL   CHIEF COMPLAINT:  Right ankle pain.   HISTORY OF PRESENT ILLNESS:  Holly Kim is a 70 year old minimal  household ambulator with Geri-Lift assist who fell today around 0200.  She  reports right ankle pain.   PAST MEDICAL HISTORY:  Notable for morbid obesity, insulin-dependent  diabetes, OA, history of possible humerus fracture, as well as history of  CVA.   PAST SURGICAL HISTORY:  Negative.   CURRENT MEDICATIONS:  1. Tegretol.  2. Neurontin.  3. Insulin.  4. Protonix.  5. Actos.  6. Risperdal.  7. Lotensin.  8. Mycelex.  9. Hydrochlorothiazide.   ALLERGIES:  HALDOL, IBUPROFEN, AMOXICILLIN.   PHYSICAL EXAMINATION:  GENERAL:  She is alert and oriented x3.  CHEST:  Clear to auscultation.  HEART:  Regular rate and rhythm.  EXTREMITIES:  Right foot toe capillary refill is less than 3.  She has about  a 10 cm laceration obliquely in the medial malleolus in the anterolateral  ankle.  She has decreased sensation consistent with diabetic neuropathy on  the dorsal and plantar aspects of her foot.  There is no groin pain with  range of motion of the right leg.  History of right knee pain; no right knee  effusion with range of motion.  Bilateral upper extremities have good range  of motion at the wrists, elbows and shoulders.   Hematocrit 30, white count 8.6, platelets 221.  UA is negative.  EKG shows  bundle branch block, otherwise, normal sinus rhythm.   X-rays show a fibular fracture, comminuted, above the level of the plafond  with syndesmotic widening.   IMPRESSION:  Grade  2-3 open fracture/dislocation of the right ankle.    PLAN:  Go ahead and do I&D tonight with cast and formal ORIF in 24 to 48  hours later.  The risks and benefits were discussed with the patient and  family.  Primary risks include infection, nonhealing, delayed healing  nonunion, malunion, and possible need for amputation.  The patient and  family understand.  We will proceed with the surgery.  All questions are  answered.                                                Burnard Bunting, M.D.    GSD/MEDQ  D:  04/26/2003  T:  04/27/2003  Job:  [id number]

## 2011-03-01 NOTE — Consult Note (Signed)
NAMEMALIK, RUFFINO NO.:  [id number]   MEDICAL RECORD NO.:  [id number]            PATIENT TYPE:   LOCATION:                                 FACILITY:   PHYSICIAN:  Gabrielle Dare. Janee Morn, M.D.     DATE OF BIRTH:   DATE OF CONSULTATION:  04/04/2006  DATE OF DISCHARGE:                                   CONSULTATION   CHIEF COMPLAINT:  Bloody emesis and abdominal pain.   HISTORY OF PRESENT ILLNESS:  The patient is a 70 year old white female who  is currently a nursing home resident.  She has multiple medical problems;  and is staying in a nursing home after recent admission to the [facility].  She came to [facility] Emergency  Department complaining of vomiting some dark blood in the primary and  abdominal pain.  The pain is better, according to her, at this time.  She  underwent workup in the emergency department which included white blood cell  count of 14.3, and CT scan of the abdomen and pelvis which shows air within  the gastric wall and some free intraperitoneal air consistent with  perforated duodenal ulcer.  The patient is currently on Coumadin for a  history of DVT with pulmonary embolus.   PAST MEDICAL HISTORY:  1.  CVA.  2.  DVT with PE.  3.  Diabetes mellitus.  4.  Diabetic retinopathy.  5.  Hypertension.  6.  Pulmonary hypertension.  7.  Sleep apnea.   PAST SURGICAL HISTORY:  1.  Appendectomy.  2.  Shoulder fracture surgery.  3.  Foot fracture surgery.  4.  Knee scope.  5.  Cervical discectomy.   CURRENT MEDICATIONS INCLUDE:  Actos, Coumadin, lactulose, Neurontin,  Norvasc, Percocet, Prilosec, Reglan, and risperidone.  She is also receiving  intravenous Avelox in the emergency department.   ALLERGIES:  AMOXICILLIN, HALDOL, IBUPROFEN, and NSAID.   SOCIAL HISTORY:  She is a resident of [facility].  She has a son and  daughter in town.   REVIEW OF SYSTEMS:  GENERAL:  She feels a little bit weak CARDIAC:  Negative.  PULMONARY:  Sleep apnea and pulmonary hypertension by history.  GI:  Please see above, though she says the nausea and pain have resolved, at  this time.  GU:  Negative.  MUSCULOSKELETAL:  Negative, acute.  NEUROPSYCH:  History of psychiatric problems.   PHYSICAL EXAM:  VITAL SIGNS:  Temperature 98.9, blood pressure 146/75, heart  rate 75, respiration rate 16.  GENERAL:  She is awake and alert.  Sclerae are clear.  NECK:  Supple.  LUNGS:  Clear to auscultation, though somewhat distant.  HEART:  Regular, but distant.  PMI is faintly palpable in the left chest.  ABDOMEN:  Markedly obese, but soft.  She has some very mild epigastric  tenderness to deep palpation.  Bowel sounds are hypoactive.  No organomegaly  is noted.  SKIN:  Warm and dry.  EXTREMITIES:  Some mild edema.   DATA REVIEWED INCLUDES:  White blood cell count 14.3, hemoglobin 11.5,  platelets 324, potassium 3.3.  Liver function tests within normal limits.  INR is 2.6.   IMPRESSION:  A 70 year old white female with multiple medical problems and  likely perforated duodenal ulcer.  She also has hypokalemia.  Some type of  primary she also has hypokalemia.   RECOMMENDATIONS:  Will recommend admission by the family practice teaching  service as they is usually care for her; and she was recently discharged.  We will begin correcting her anticoagulation; and then she may possibly need  exploratory laparotomy later today.  I will discuss her in detail with my  partner Dr. Abbey Chatters who will be working, here, today.  In addition we  will being supplementing her potassium.  Plan was discussed with Dr. Iantha Fallen in the ED and he will contact the  teaching service.  i      Gabrielle Dare. Janee Morn, M.D.  Electronically Signed     BET/MEDQ  D:  04/04/2006  T:  04/04/2006  Job:  [id number]   cc:   Teaching Service [facility]

## 2011-03-01 NOTE — H&P (Signed)
NAME:  Holly Kim, Holly Kim NO.:  [id number]   MEDICAL RECORD NO.:  [id number]                   PATIENT TYPE:  INP   LOCATION:  1824                                 FACILITY:  [facility]   PHYSICIAN:  Lenon Curt. Chilton Si, M.D.               DATE OF BIRTH:  12/16/1940   DATE OF ADMISSION:  03/25/2004  DATE OF DISCHARGE:                                HISTORY & PHYSICAL   CHIEF COMPLAINT:  Nausea and vomiting.   HISTORY OF PRESENT ILLNESS:  A 70 year old white female resident of [facility] under the care of primary care physician, Dr. Gildardo Pounds, who  was admitted through [facility] following ambulance  transport from her nursing home to the emergency room.  The patient was sent  to evaluate a possible coffee-ground emesis.  The patient has become  acutely ill today.  She says that she was doing well yesterday.  She vomited  intermittently throughout the day despite injections of Phenergan x2.  Her  last emesis had coffee-grounds color per nursing report.  The patient  reports that she tried to drink some coffee this afternoon, and then  vomited.   She has a history of gastritis which was Helicobacter pylori positive with  prior GI bleed.  She has a normocytic anemia.  EGD done 09/2003 was normal  and colonoscopy showed a benign polyp on 09/2003.   PAST MEDICAL HISTORY:  1. Recurrent urinary tract infections, last one being Enterococcus.  2. On 09/2003, acute renal failure.  3. Gastritis, Helicobacter pylori positive, subsequently treated with     metronidazole, Bactrim and Protonix.  4. History of pulmonary embolus.  5. Severe deconditioning.  6. Morbid obesity.  7. Fracture of the right ankle, 04/2003.  8. Ischemic cardiomyopathy.  9. 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  10.      Anemia of chronic disease.  11.      History of transient ischemic attack x5.  12.      History of CVA.  13.       Tremor.  14.      History of seizure.  15.      Chronic edema.  16.      Coronary artery disease.  17.      Gastroesophageal reflux disease.  18.      Bronchospasm.  19.      Right bundle branch block.  20.      In 2004, cholelithiasis on x-ray of abdomen.  21.      In 2003, right prepatellar bursitis.  22.      In 2003, depression.  23.      In 2003, osteoarthritis.  24.      In 2003, fracture of the right humerus.  25.      Hypercholesterolemia.   PAST SURGICAL HISTORY:  1. On 04/2003, open reduction and internal fixation of right ankle Orest Dikes):  Fractured from fall from a lift chair.  2. On 07/2003, removal of right ankle screw by Dr. August Saucer.   PROCEDURES:  1. Partial listing includes 2004, bone marrow biopsy; anemia of chronic     disease.  2. On 08/2003, colonoscopy:  Hyperplastic polyp, otherwise unremarkable.  3. On 08/2003, EGD:  Gastritis, Helicobacter pylori positive.  4. On 08/2003, CT of the brain:  Old lacunar infarct, basal ganglia,     occipital lobe infarct on the right side, left cerebellar infarct.  5. On 08/2003, renal ultrasound:  Normal except for tiny, mobile gallstones     noted incidentally.  6. On 08/2003, Doppler of the leg veins:  Normal.  7. On 08/2003, transfusion of two units of packed red blood cells.   CONSULTATIONS:  1. Dr. Jeanie Sewer, hematology/oncology in [facility], [place].  2. [facility].  3. Dr. Bascom Levels, gastroenterology, in [facility], [place].  4. Dr. Kendrick Ranch, general surgery.  5. Dr. Nolon Rod.  6. Dr. Ophelia Charter, orthopedics.  7. Dr. August Saucer, orthopedics.   DIET:  Regular, no-added-salt diet, no concentrated sweets with bedtime  snack.   ALLERGIES/INTOLERANCE'S:  Recorded to Haldol and NSAIDs, possibly  amoxicillin intolerance although she did tolerate a course of this 09/2003  without incident.   MEDICATIONS:  1. Vitamin C 500 mg daily.  2. Risperdal 0.5 mg daily.  3. Prilosec 20 mg daily.  4. K-Dur 20  mEq daily.  5. Nu-Iron 150 one b.i.d.  6. Trazodone 150 mg one at bedtime.  7. Lipitor 40 mg at bedtime.  8. Magnesium citrate 10 mL every third day.  9. Combivent two puffs q.i.d. p.r.n. shortness of breath.  10.      Mobic 7.5 mg p.o. daily.  11.      Vicodin p.r.n.  12.      Lasix 40 mg daily.  13.      Folic acid 1 mg daily.  14.      Tegretol 200 mg daily.  15.      Gabapentin 600 mg b.i.d.  16.      Coumadin 7.5 mg Tuesday, Thursday, Saturday and Sunday; 8 mg     Monday, Wednesday and Friday.  17.      Senokot S two nightly.  18.      Colace 100 mg b.i.d.  19.      Lidoderm patch, right ankle p.r.n.  20.      Alprazolam 0.25 mg t.i.d. p.r.n.  21.      Tylenol 650 mg b.i.d.   FAMILY HISTORY:  Mother died of dementia.  Father died of blood disorder.  Six siblings:  One sister, five brothers whose health history is uncertain.  Six children:  One child died of sudden infant death syndrome, others are  living and reportedly well.  Family history is positive in other members of  coronary artery disease and diabetes.   SOCIAL HISTORY:  Widowed in 1971, previous Education officer, environmental, Conservator, museum/gallery.  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  The patient was living with her son up to 04/2003, when  she fractured her ankle.  She was then sent to live in nursing home,  initially at [facility] in [facility] and ultimately returned to hospital in  [place], and then was discharged to [facility] in [place].   No alcohol or tobacco intake.   Son is Tiffney Haughton at [phone] and daughter is Zella Ball at [phone].   REVIEW OF SYSTEMS:  HEENT DESCRIPTION:  History of  cataracts, diabetic  retinopathy with multiple laser surgeries.  She is also edentulous and does  not wear dentures.  RESPIRATORY:  Episodic shortness of breath, history of pulmonary embolus, no  recent cough or hemoptysis.  CARDIOVASCULAR:  Prior history of coronary artery disease, prior MI,  cardiomyopathy, no recent chest  pain. GASTROINTESTINAL:  History of cholelithiasis several months ago.  Has had a  more recent CT of the abdomen 03/2004 which was unremarkable and did not  disclose gallstones.  She has reflux symptoms and constipation.  She is  incontinent of stool.  GENITOURINARY:  Incontinent of urine, recurrent UTIs, most recently with  Enterococcus.  MUSCULOSKELETAL:  History of right shoulder bursitis, apparently doing  fairly well.  Generalized weakness, fracture of the right arm 02/2003,  history of cane and wheelchair use.  NEUROLOGIC:  History of seizures but none known recently, TIAs and prior  strokes and tremor.  Denies headache at the present time.  ENDOCRINE:  Insulin-dependent diabetes mellitus, no known thyroid disorder.   PHYSICAL EXAMINATION:  VITAL SIGNS:  Temp 98, pulse 80, blood pressure  140/60, respirations 20.  GENERAL:  Morbid obesity, alert, cooperative.  SKIN:  Grossly normal.  HEENT:  Cataracts present bilaterally, she is edentulous, but otherwise  unremarkable.  NECK:  Supple, no thyromegaly, nodular mass, bruits or adenopathy.  NODES:  None palpable, cervical, axillary, inguinal or other areas.  BREASTS:  Pendulous without masses or focal pain.  CHEST:  Clear.  Mild tachypnea at rest.  No cough.  HEART:  Distant sounds, no gallop, murmur, click or rub is audible.  ABDOMEN:  Morbidly obese.  Tender in the left lower quadrant.  Appears to be  some subcutaneous edema in this area.  At least, it precludes feeling any  significant organomegaly.  I do not think that she has ascites, but even  this would be difficult to tell.  GENITALIA:  Foley catheter in place.  External genitalia normal.  No  evidence of rash.  RECTAL:  Not done at this time, secondary to difficulty in moving the  patient about in bed in the emergency room, due to her morbid obesity.  MUSCULOSKELETAL:  She has a brace at the right ankle.  There is mild  tenderness at the knees.  She is able to move all  extremities.  CIRCULATION:  Two-plus DP and PT.  NEUROLOGIC:  Tremors evident bilaterally with diminished sensation to her  legs and decreased strength in the legs.   LABORATORY DATA:  EKG:  Right bundle branch block.  INR 1.7, CMP normal, CBC  normal.  Lipase 17.   PENDING:  The x-rays of the chest and abdomen.   IMPRESSION:  1. Intractable nausea and vomiting.  2. Insulin-dependent diabetes.  3. Possible gastrointestinal bleed.   PLAN:  The patient is to be admitted.  We will provide obvious support,  Levaquin started empirically, will follow urine culture.  I have held oral  medications and if there is no further vomiting, then we need to resume oral  medications as soon as possible.                                                Lenon Curt Chilton Si, M.D.    AGG/MEDQ  D:  03/25/2004  T:  03/25/2004  Job:  [id number]

## 2011-03-01 NOTE — Op Note (Signed)
   NAME:  LOZA, PRELL                   ACCOUNT NO.:  [id number]   MEDICAL RECORD NO.:  [id number]                   PATIENT TYPE:  OIB   LOCATION:  NA                                   FACILITY:  [facility]   PHYSICIAN:  Burnard Bunting, M.D.                 DATE OF BIRTH:  1941-05-07   DATE OF PROCEDURE:  07/26/2003  DATE OF DISCHARGE:                                 OPERATIVE REPORT   PREOPERATIVE DIAGNOSIS:  Retained syndesmotic screw, right ankle.   POSTOPERATIVE DIAGNOSIS:  Retained syndesmotic screw, right ankle.   OPERATION PERFORMED:  Removal of retained syndesmotic screw.   SURGEON:  Burnard Bunting, M.D.   ANESTHESIA:  MAC with local.   ESTIMATED BLOOD LOSS:  2mL.   DRAINS:  None.   DESCRIPTION OF PROCEDURE:  The patient was brought to the operating room  where local and MAC anesthesia was introduced.  Preoperative intravenous  antibiotics were administered.  The right leg was prepped with DuraPrep  solution and draped in sterile manner.  Collier Flowers was used to cover the  operative field.  Under fluoroscopic guidance, a 2 cm incision was made over  the Syndesmotic screw.  Soft tissue was divided, including skin and  subcutaneous tissue and fascia down to plate.  The screw head was  visualized.  It was removed under direct visualization.  The syndesmosis was  stable under fluoroscopic exam.  The incision was irrigated and closed using  3-0 nylon suture.  A bulky dressing was placed.                                                Burnard Bunting, M.D.    GSD/MEDQ  D:  07/26/2003  T:  07/26/2003  Job:  [id number]

## 2011-03-01 NOTE — Discharge Summary (Signed)
[facility]  Patient:    LAQUIDA, COTRELL Visit Number: [id number] MRN: [id number]          Service Type: MED Location: [phone] Attending Physician:  McDiarmid, Leighton Roach. Dictated by:   Lucille Passy, M.D. Admit Date:  03/06/2002 Disc. Date: 03/09/02   CC:         Solon Palm, M.D. at the [facility]  Reynolds Bowl, M.D. in orthopedics   Discharge Summary  SERVICE:  [facility] Service.  DISCHARGE DIAGNOSES:  1. Status post right humeral head fracture approximately two weeks prior to     admission.  2. Immobility, acute-on-chronic.  3. Right prepatellar bursitis.  4. Depression.  5. Diabetes type 2.  6. Hypertension.  7. Hypercholesterolemia.  8. Obesity.  9. History of borderline personality disorder. 10. History of peripheral neuropathy. 11. History of arthritis. 12. History of cerebrovascular accident. 13. History of coronary artery disease.  DISCHARGE MEDICATIONS:  1. Actos 30 mg p.o. q.d.  2. Aspirin 81 mg p.o. q.d.  3. HCTZ 12.5 mg p.o. q.a.m.  4. Insulin 70/30 55 units subcu q.a.m., 30 units subcu q.p.m. at 1700.  5. K-Dur 20 mEq p.o. q.d.  6. Lasix 80 mg p.o. q.a.m., 40 mg p.o. q.p.m.  7. Lotensin 20 mg p.o. q.a.m.  8. Neurontin 600 mg p.o. b.i.d.  9. Prevacid 30 mg p.o. q.a.m. 10. Risperdal 1.5 mg p.o. q.h.s. 11. Tegretol 200 mg p.o. b.i.d. 12. Zoloft 50 mg p.o. q.d. 13. Senokot S two tablets p.o. b.i.d. 14. Fentanyl patch 50 mcg q.72h. 15. Astelin nasal spray q.d. 16. MSIR 30 mg p.o. q.4h. p.r.n. pain. 17. Miralax 17 g in 8 ounces of water p.o. q.d.  DISPOSITION:  The patient will be transferred to the [facility], as she has become increasingly immobile and has been falling status post her right humeral fracture and will need physical therapy for mobility and reconditioning.  HISTORY OF PRESENT ILLNESS:  A 70 year old morbidly-obese  female who sustained a right humeral head fracture approximately two weeks prior to admission who presents on admission after falling twice on the day of admission secondary to increased immobility from her right arm fracture.  The patient was seen several days prior to admission by her primary care doctor and offered admission to the [facility] for more permanent care but the patient refused at that time.  However, on admission the patient felt that due to her decreased mobility she did desire admission to the [facility], as her son and home health aide were unable to assist her adequately.  See dictated History and Physical for details.  ADMISSION LABORATORY DATA AND STUDIES:  X-ray of the right knee and tibia-fibula negative for fracture.  Urinalysis shows specific gravity 1.013, large blood, large leukocyte esterase, 3-6 white blood cells, 7-10 rbcs, rare bacteria.  Admission CBC shows white blood cells 7.7, hemoglobin 9.8, hematocrit 29.9, platelets 225, MCV 75.0.  Admission basic metabolic panel shows sodium 134, potassium 4.6, chloride 92, CO2 34, glucose 248, BUN 49, creatinine 1.4, calcium 8.8.  BRIEF HOSPITAL COURSE: #1 - RIGHT HUMERAL FRACTURE TWO WEEKS AGO:  The patient has already been seen by Dr. Criss Alvine as an outpatient who recommended an immobilizer to the right arm for four weeks and then slowly increase mobility if follow-up x-rays show good healing of the fracture.  This injury has caused increased immobility which required admission to the  [facility] or other comparable site.  #2 - IMMOBILITY, ACUTE-ON-CHRONIC:  The patient has chronic immobility secondary to morbid obesity and arthritis and peripheral neuropathy, but this was exacerbated by the above injury.  #3 - DEPRESSION:  Not an active issue during this hospitalization, continue current medications as above.  #4 - DIABETES, TYPE 2:  Blood sugars were under  excellent control during hospitalization and ran in the range of 70 to 190.  Continue insulin and Actos at current doses.  #5 - HYPERTENSION:  Excellent control during hospitalization, continue current regimen.  #6 - PREPATELLAR BURSITIS ON THE RIGHT SECONDARY TO FALLS:  X-rays negative for fracture of the right lower extremity.  Pain control is adequate with Fentanyl patch and morphine.  The patient switched to oral morphine p.r.n. on the day of discharge.  Lower extremity edema right greater than left was noted during admission.  Lower extremity Dopplers on Mar 08, 2002 were negative for DVT.  A D-dimer at that time was positive at 3.80, but this was likely due to the patients recent fracture.  Applying ice to the right patellar area gives the patient some relief as well.  #7 - HYPERCHOLESTEROLEMIA:  Not an active issue during hospitalization.  #8 - OBESITY, BORDERLINE PERSONALITY DISORDER, PERIPHERAL NEUROPATHY, ARTHRITIS, HISTORY OF CEREBROVASCULAR ACCIDENT, HISTORY OF CORONARY ARTERY DISEASE:  These issues were not active issues during hospitalization; continue current management.  FOLLOW-UP:  The patient should follow up with her primary doctor, Dr. Solon Palm, in the [facility] within one week after discharge.  RECOMMENDATIONS FOR THE PATIENTS PRIMARY DOCTOR:  Please note that the patient had asymptomatic bacteriuria during hospitalization.  See above urinalysis.  Urine culture showed greater than 100,000 colonies of Proteus mirabilis sensitive to Bactrim, Cipro, ceftriaxone, norfloxacin, gentamicin. However, after the patients Foley catheter was removed she no longer experienced dysuria or any problems with urination, so she was not treated at this time. Dictated by:   Lucille Passy, M.D. Attending Physician:  McDiarmid, Tawanna Cooler D. DD:  03/09/02 TD:  03/09/02 Job: [id number]

## 2011-03-01 NOTE — H&P (Signed)
NAME:  Holly Kim, Holly Kim NO.:  [id number]   MEDICAL RECORD NO.:  [id number]          PATIENT TYPE:  EMS   LOCATION:  MAJO                         FACILITY:  [facility]   PHYSICIAN:  Broadus John T. Pickard II, MDDATE OF BIRTH:  1941-03-09   DATE OF ADMISSION:  04/03/2006  DATE OF DISCHARGE:                                HISTORY & PHYSICAL   CHIEF COMPLAINT:  Abdominal pain.   HISTORY OF PRESENT ILLNESS:  The patient is a 70 year old morbidly obese  Caucasian female with a recent admission for delirium secondary to a  UTI/contraction alkalosis/supratherapeutic INR who developed epigastric  discomfort earlier this week.  She describes the pain as a burning throbbing  sensation in her subxiphoid region.  Along with the pain, she began vomiting  approximately two days ago.  She states she has been throwing up blood for  the last two days.  She denies any lightheadedness, syncope, chest pain, or  shortness of breath.  She states that the epigastric discomfort is now gone  and has gotten much better.   REVIEW OF SYSTEMS:  CONSTITUTIONAL:  Positive for fever and chills.  CARDIOVASCULAR:  Negative for chest pain or pleuritic pain.  PULMONARY:  Negative for shortness of breath, cough, or dyspnea.  GI:  Positive for  nausea, vomiting, and hematochezia.  GU:  Negative for dysuria.  SKIN:  Positive for bruising.   PAST MEDICAL HISTORY:  1.  Diabetes mellitus type 2.  2.  Hypercholesterolemia.  3.  Obesity.  4.  Borderline personality disorder and depressive disorder.  5.  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  6.  Hypertension.  7.  Arthritis.  8.  History of a CVA.  9.  History of a DVT with a pulmonary embolism status post a skull fracture      in 2004.  10. History of a decubitus ulcer.  11. History of pulmonary hypertension secondary to sleep apnea, obesity, and      hypoventilation syndrome.   MEDICATIONS:  1.  Norvasc 5 mg p.o. daily.  2.  Risperdal 0.5 mg  p.o. q.h.s.  3.  Neurontin 600 mg p.o. b.i.d.  4.  Percocet 5/325 p.o. t.i.d. p.r.n.  5.  Xanax 0.25 mg p.o. q.h.s.  6.  Reglan 10 mg p.o. q.a.c. and q.h.s.  7.  NovoLog 70/30 45 units in the morning and 35 units at night.  8.  Prilosec 20 mg p.o. daily.  9.  Lactulose 30 mL p.o. b.i.d.  10. Nizoral cream to skin folds daily.  11. Coumadin 5 mg p.o. daily.  12. The patient has a history of being on Lasix 80 mg p.o. b.i.d. and K-Dur      20 mEq p.o. b.i.d., but these are being held at present.   ALLERGIES:  IBUPROFEN, HALDOL, AUGMENTIN, ACE INHIBITOR, AND MS CONTIN.   PAST SURGICAL HISTORY:  1.  History of a right ankle fracture status post ORIF in July of 2004.  2.  History of a right humerus fracture in 2003.  3.  Echocardiogram done in October of 2006 that showed an ejection fraction      of 55-65% with  right ventricular pressure of 32 mmHg and elevated.   SOCIAL HISTORY:  She lives at [facility]  Minimal activity.  Needs total assist with transfers and daily care.  Is mostly confined to a  wheelchair.  Does not smoke or drink.   FAMILY HISTORY:  She has an aunt with breast cancer.   PHYSICAL EXAMINATION:  VITAL SIGNS:  Temperature 100.3, pulse 72-80, blood  pressure 146-161/66-76, respiratory rate 16-24, 95% on room air.  GENERAL:  Morbidly obese Caucasian female, alert and oriented x3, but  rambling speech with tardive dyskinesia.  HEENT:  Atraumatic, normocephalic; however, the patient has hirsutism on her  chin.  Pupils are equal, round, and reactive to light.  Extraocular  movements are intact.  Tympanic membranes and canals are clear on  examination.  There is no erythema or exudate in the posterior oropharynx,  and mucous membranes are moist.  LUNGS:  Clear to auscultation bilaterally.  No wheezes, crackles, or rales.  CARDIOVASCULAR:  Regular rate and rhythm.  No murmurs, rubs, or gallops.  ABDOMEN:  Soft.  The patient is diffusely tender, with decreased  bowel  sounds.  No rebound and some minimal guarding.  EXTREMITIES:  2+ to 3+ edema bilaterally.  Pulses are 2+/4 and equal and  symmetric bilaterally in upper and lower extremities.  NEUROLOGIC:  Cranial nerves II-XII are grossly intact.  Muscle strength is  5/5 and equal and symmetric in the upper and lower extremities.  The patient  moves all four extremities.  SKIN:  Normal; however, I am unable to evaluate her sacrum for decubitus  ulcer given her morbid obesity, and I am unable to physically move the  patient.   LABORATORY DATA:  Urinalysis shows a specific gravity of 1.025, glucose  negative, moderate hemoglobin, small bilirubin, 30 protein, negative  nitrites, small leukocyte esterase, 7-10 white blood cell count, 7-10 red  blood cells, and many bacteria.  There is a urine culture pending.  White  count 14.3, hemoglobin 11.5, hematocrit 35.2, platelet count 324.  Sodium  140, potassium 3.3, chloride 103, bicarb 33, BUN 11, creatinine 0.9, glucose  43 and the patient was given an amp of D50, calcium 9.1, total protein 6.8.  Albumin 2.8, AST 21, ALT 13, alkaline phosphatase 62, total bilirubin 0.6.  INR 2.6, PT 27.5, PTT 43.   IMAGING STUDIES:  CT of the abdomen and pelvis shows gas in the wall of the  stomach with free intraperitoneal air consistent with perforation.  There  are inflammatory changes in the epigastrium concerning for peptic ulcer  disease.  The patient also has a T9 vertebral fracture.   ASSESSMENT AND PLAN:  The patient is a 70 year old white female.  1.  Perforated duodenal ulcer versus microperforation in the stomach.  Will      type and screen 2 units, admit to the stepdown unit, and get intravenous      access, including central access.  Will pan culture with urine and      blood.  Will begin broad-spectrum antibiotics.  Given her history of      rash and questionable anaphylactic symptoms with Augmentin, she says she     has some trouble breathing, we will  use tigecycline 100 mg intravenous      load and then 50 mg intravenously every 12 hours to cover for gut flora.      We appreciate surgery's assistance and will follow closely for signs of      sepsis or shock.  The plan is to management conservatively for now      unless clinical picture deteriorates.  2.  T9 vertebral fracture.  Unsure of the etiology at present.  At the      present time, will not work up further until the patient is stabilized      from her acute issue.  We did consider doing a bone scan to see if there      are any other lesions that light up in the bones or possibly working up      for metastatic causes.  However, given the patient's otherwise poor      status, I think we will elect to manage this conservatively at present.  3.  Deep venous thrombosis/pulmonary embolus.  This was in 2004.  The      patient is actually on Coumadin for her immobility.  We are giving the      patient fresh frozen plasma 2 units now and vitamin K 5 mg intravenously      x1.  We will recheck an international normalized ratio and prothrombin      time later today and reverse if necessary.  We will use sequential      compression devices while the patient is in bed.  4.  Hypertension.  Will hold the patient's Lasix and Norvasc for now as the      patient is nothing by mouth.  Will give D5 half-normal saline at 75 cc      an hour as a semi-maintenance intravenous fluid and chase with as needed      intravenous Lasix as necessary.  Will also chase with as needed      labetalol for blood pressure if necessary.  5.  Pulmonary hypertension.  Stable.  6.  Diabetes mellitus type 2.  Will begin the __________ hyperglycemia      protocol.  Will make the patient nothing by mouth for now, hold her      Actos, and also hold her insulin 70/30.  We are holding Actos mainly      because it contributes to edema, and the patient is nothing by mouth.      Will hold the 70/30 until we see what the patient's  blood sugars are      going to be like on nothing by mouth.  The hyperglycemia protocol has a      transition to Lantus if necessary.  7.  Psychiatric disorder.  Risperdal by mouth will be placed on hold.  Will      treat the patient with as needed Geodon given her history of an allergy      to Haldol.  8.  Diabetic gastroparesis.  Will place a nasogastric tube to intermittent      wall suction and hold her Reglan at present.  9.  Peptic ulcer disease.  Will place the patient on Protonix 40 mg      intravenously twice a day.  10. Fluids, electrolytes, and nutrition.  Will give D5 half-normal saline at      75 cc an hour and place a Foley.  11. Hematology.  Will get every six hours hemoglobin and hematocrit, type      and screen 2 units, stay 2 units ahead, and transfuse if necessary.   At present, the patient appears stable but her condition is very guarded.      Broadus John T. Pamalee Leyden, MD    WTP/MEDQ  D:  04/04/2006  T:  04/04/2006  Job:  [id number]

## 2011-03-01 NOTE — Discharge Summary (Signed)
NAME:  Holly Kim, Holly Kim                   ACCOUNT NO.:  [id number]   MEDICAL RECORD NO.:  [id number]                   PATIENT TYPE:  NP   LOCATION:  5739                                 FACILITY:  [facility]   PHYSICIAN:  Timothy E. Earlene Plater, M.D.              DATE OF BIRTH:  [birth year]   DATE OF ADMISSION:  03/06/2002  DATE OF DISCHARGE:  03/17/2002                                 DISCHARGE SUMMARY   DISCHARGE DIAGNOSIS:  Status post fall, right arm injury.   SECONDARY DIAGNOSIS:  1. Diabetes mellitus type 2.  2. Hypercholesterolemia.  3. Obesity.  4. Borderline personality.  5. Depression.  6. Peripheral neuropathy.  7. Hypertension.  8. Status post cerebrovascular accident.  9. Coronary artery disease.  10.      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   CONSULTATIONS:  Dr. Annell Greening from orthopedic surgery consulted with this  patient.   LABORATORY DATA:  1. CBC at discharge showed a WBC of 5.8, hemoglobin 8.9, hematocrit 27.0,     platelet count of 192.  This was decreased from admission H&H level of     9.8 and 29.9.  2. Stool occults x2 were negative.  3. D-dimer was slightly elevated at 3.8.  4. BMET at discharge showed sodium of 133, potassium of 4.7, chloride of 96,     CO2 32, glucose of 82, BUN of 26, creatinine of 1.1, calcium of 8.8.     This was improved from admission which showed a glucose of 248 and a BUN     of 49, with all others being stable.  5. Iron studies showed a low total iron at 19, low TIBC at 237, and a low     saturation of 8%.  Ferritin was normal at 107.  6. Urine culture showed greater than 100,000 colonies of Proteus mirabilis.     Repeat culture after treatment showed no growth.  7. Blood cultures x2 showed one with no growth and the other with     Staphylococcus aureus.  8. Lower extremity Doppler showed no evidence of DVT, significant     thrombosis, or Baker's cyst bilaterally.  9. Right knee and right tibia/fibula x-rays show  no acute bone abnormality.     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  Moderate knee joint effusion.  10.      Right shoulder films showed comminuted proximal humeral fracture     involving the humeral neck and head, but without dislocation.  It also     showed minimal irregularity and ______ just inferior to the glenoid, and     a fracture in this area is not excluded.   HOSPITAL COURSE:  1. STATUS POST FALL, RIGHT ARM FRACTURE.  Patient was put in a shoulder     immobilizer and treated for pain with a Duragesic patch.  She was also     treated with Ultracet  p.r.n.  She was then transferred to [facility] for     extended rehabilitation.  While there, she worked towards getting back to     her baseline including transfers, using a wheelchair.  She was able to     reach those goals at discharge.   1. HYPERTENSION.  Patient was well-controlled during her admission period.     She was maintained on her regular regimen of Lasix, hydrochlorothiazide     and ACE inhibitor.   1. DIABETES MELLITUS.  The patient was again well-controlled in this area as     well.  She was maintained on her home medicines including scheduled     insulin and sliding scale.   1. ANEMIA.  The patient's hemoglobin was stable throughout her admission.     She was maintained on iron supplementation.   1. UTI.  The patient was treated with Bactrim for dysuria.   DISCHARGE MEDICATIONS:  1. Aspirin 81 mg q.d.  2. Tegretol 300 mg b.i.d.  3. Neurontin 600 mg b.i.d.  4. Insulin 70/30, 55 units subq q.a.m., 30 units subq q.p.m.  5. Protonix 40 mg q.d.  6. Actos 30 mg q.d.  7. K-Dur 10 mEq q.d.  8. Risperdal 1.5 mg q.h.s.  9. Zoloft 50 mg q.d.  10.      Lotensin 20 mg q.d.  11.      Duragesic patch applied to skin q.3d.  12.      Ferrous sulfate 325 mg t.i.d. with meals.  13.      Hydrochlorothiazide 12.5 mg q.d.  14.      Lasix 80 mg q.a.m. and 40 mg q.p.m.  15.       Tylenol p.r.n.  16.      Naproxen p.r.n.   ACTIVITY:  She needs assistance with transfers.   DIET:  She was put on a low salt diet with less than 2 g per day.  She was  told to avoid fatty fried and fast foods, and to eat more fruits and  vegetables.   WOUND CARE:  Not applicable.   FOLLOW UP:  She was arranged for Home Health PT and OT.  She was made an  appointment with Dr. Roger Shelter to follow up on June 19, at 1:55 p.m.  She is to  call [phone] with questions or concerns.                                                 Timothy E. Earlene Plater, M.D.    TED/MEDQ  D:  05/26/2002  T:  05/30/2002  Job:  [id number]

## 2011-03-01 NOTE — Op Note (Signed)
   NAME:  Holly Kim, Holly Kim NO.:  [id number]   MEDICAL RECORD NO.:  [id number]                   PATIENT TYPE:  INP   LOCATION:  1826                                 FACILITY:  [facility]   PHYSICIAN:  Burnard Bunting, M.D.                 DATE OF BIRTH:  1941/08/17   DATE OF PROCEDURE:  04/25/2003  DATE OF DISCHARGE:                                 OPERATIVE REPORT   PREOPERATIVE DIAGNOSIS:  Right grade 3 open ankle fracture.   POSTOPERATIVE DIAGNOSIS:  Right grade 3 open ankle fracture.   PROCEDURE:  Irrigation and debridement of skin, subcutaneous tissue, muscle,  fascia and bone associated with open fracture with subsequent reduction in  casting.   SURGEON:  Burnard Bunting, M.D.   ANESTHESIA:  General endotracheal.   ESTIMATED BLOOD LOSS:  50 mL   DRAINS:  None.   DESCRIPTION OF PROCEDURE:  The patient was brought to the operating room  where general endotracheal anesthesia was induced. Preoperative IV  antibiotics were maintained. The right ankle was prepped with Hibiclens  solution and draped in a sterile manner. A 10 cm oblique laceration  extending from the posterior aspect of the medial malleolus and across to  the anterolateral aspect of the fibula was encountered. The ankle joint  could be easily opened. The posterior tibial tendon and flexor tendons were  visible. The open skin area extended approximately 1/2 the circumference of  the ankle. The saphenous vein was torn and it was subsequently ligated with  a 2-0 Vicryl suture ligature. The bony fragments were removed from within  the joint. The medial malleolus had a small avulsion fragment. Exposed bony  ends were debrided. The devitalized skin, subcutaneous tissue, muscle,  fascia and bone was also sharply debrided. Six liters of irrigating solution  was then utilized to irrigate out the tibiotalar joint and open fracture.  Each 3 liter bag of irrigating solution was instilled with 5 mL of  Dial  soap. The final liter of irrigation was normal saline. At this time, the  ankle was reduced and near-far-far-near 2-0 nylon sutures were placed to  reapproximate the skin edges. Only about five of these sutures were utilized  leaving the predominant aspect of the incision open. Dressing and posterior  splint was applied with the ankle reduced. Toes were noted to be perfused at  the conclusion of the case. The patient was transferred to the recovery room  in stable condition.                                               Burnard Bunting, M.D.    GSD/MEDQ  D:  04/25/2003  T:  04/25/2003  Job:  [id number]
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
## 2011-03-01 NOTE — Discharge Summary (Signed)
   NAME:  Holly Kim, Holly Kim                   ACCOUNT NO.:  [id number]   MEDICAL RECORD NO.:  [id number]                   PATIENT TYPE:  INP   LOCATION:  5004                                 FACILITY:  [facility]   PHYSICIAN:  Burnard Bunting, M.D.                 DATE OF BIRTH:  21-Aug-1941   DATE OF ADMISSION:  04/25/2003  DATE OF DISCHARGE:  04/29/2003                                 DISCHARGE SUMMARY   DISCHARGE DIAGNOSIS:  Grade 3, open, distal tib-fib fracture.   SECONDARY DIAGNOSES:  1. Morbid obesity.  2. Insulin-dependent diabetes.  3. Osteoarthritis.  4. History of cerebrovascular accident.  5. History of seizures.   OPERATIONS AND PROCEDURES:  1. Incision and drainage of right open ankle fracture on April 25, 2003.  2. Repeat incision and drainage with internal fixation of ankle fracture     performed April 26, 2003.   HOSPITAL COURSE:  Holly Kim is a 70 year old minimal household  ambulatory with Gerilift assist who fell on the day of admission. She  sustained at that time a significant open ankle injury. She had an open  wound, which extended half the circumference of her right ankle. The patient  was admitted to the orthopedic service. Intravenous IV antibiotics were  initiated. She went to the operating room on April 25, 2003 at which time  incision, drainage, and debridement was performed. Approximately 36 hours  later the patient was taken back to the OR with a repeat irrigation,  debridement, and fixation of the unstable lateral malleolar fracture was  performed. The patient tolerated the procedure well without any  complications. She was transferred to the recovery room in stable condition.  The foot remained perfused. Lovenox was started for DVT prophylaxis.  Infectious disease consult was obtained in order to maximize her antibiotic  coverage.  The patient's incision was inspected on postoperative day number  three and was found to be intact. She is  going to have to remain splinted  and nonweightbearing for a period of six weeks. She will need to follow up  in the orthopedic clinic in seven days for recheck on the incision.   DISCHARGE MEDICATIONS:  Admission medications plus Lovenox and ceftriaxone.   DISCHARGE INSTRUCTIONS:  She will maintain nonweightbearing status.                                               Burnard Bunting, M.D.    GSD/MEDQ  D:  04/29/2003  T:  04/29/2003  Job:  [id number]

## 2011-03-01 NOTE — H&P (Signed)
NAMESAILOR, HEVIA NO.:  [id number]   MEDICAL RECORD NO.:  [id number]          PATIENT TYPE:  INP   LOCATION:  5738                         FACILITY:  [facility]   PHYSICIAN:  Leighton Roach McDiarmid, M.D.DATE OF BIRTH:  11-08-1940   DATE OF ADMISSION:  04/18/2006  DATE OF DISCHARGE:                                HISTORY & PHYSICAL   PRIMARY CARE PHYSICIAN:  Dr.  Merilynn Finland at [facility].   CHIEF COMPLAINT:  Intractable nausea and vomiting.   HISTORY OF PRESENT ILLNESS:  This is a 70 year old obese white female  recently discharged from [facility] after a  perforated duodenal ulcer that was managed medically and right cephalic vein  DVT (not currently on anticoagulation secondary to gastric bleed), who  presents three days after discharge with intractable nausea and vomiting  since last night. She had been eating well, having normal bowel movements  since discharge. She began feeling worse last night and has been vomiting  nonbloody feculent matter since. Bowel movements today plus flatulence. She  denies fever, dizziness, syncope.  Positive abdominal pain relieved with  vomiting. In the ED an abdominal x-ray showed distended stomach. NG tube  returned 500 mL of feculent material. Urinalysis was positive for an  infection although the patient denies dysuria or incontinence.  In the  emergency department she did receive Reglan, Dilaudid, and was begun on  Cipro 400 mg IV.   REVIEW OF SYSTEMS:  Negative for dysuria, incontinence, hematuria, chest  pain, shortness of breath, wheezing or cough, rashes, lesions, or wounds.  Otherwise negative as per the HPI.   PAST MEDICAL HISTORY:  1.  Recently discharged on April 15, 2006, after perforated duodenal ulcer      managed medically.  2.  History of DVT in leg and right upper extremity cephalic vein on last      admission--not currently on anticoagulation secondary to low risk  of      embolism from cephalic vein and recent GI bleed.  3.  Diabetes, type 2.  4.  Obesity.  5.  Diabetes retinopathy.  6.  Diabetic gastroparesis.  7.  Anemia.  8.  Pulmonary hypertension.  9.  Arthritis.  10. Hypertension.  11. Borderline personality.  12. Pseudoseizures.  13. Depression.  14. Hypercholesterolemia.  15. Peripheral neuropathy.  16. History of decubitus ulcer.  17. Tardive dyskinesia.   SOCIAL HISTORY:  The patient lives at [facility].  She  has been bed bound and requires assistance for all transfers since her  discharge on April 15, 2006.  She denies smoking or alcohol. Her son, Navjot Pilgrim, is attentive and home number is [phone], cell phone is [phone].   FAMILY HISTORY:  Breast cancer in an aunt.   ALLERGIES:  IBUPROFEN causes swelling; HALDOL; AUGMENTIN causes hives; ACE  INHIBITOR; MS CONTIN causes lethargy.   CURRENT MEDICATIONS:  1.  Insulin 70/30, 45 units in the a.m. and 35 units in the p.m.  2.  Lactulose 30 mL b.i.d. p.r.n. constipation.  3.  Lasix 20 mg b.i.d. (this was decreased from 80 mg  b.i.d. on last      admission).  4.  Neurontin 600 mg b.i.d.  5.  Tegretol 200 mg p.o. daily.  6.  Prilosec 20 mg daily.  7.  Reglan 10 mg q.a.c. and q.h.s.  8.  Risperdal 0.5 mg q.h.s.  9.  Xanax 0.25 mg q.h.s.   PHYSICAL EXAMINATION:  VITAL SIGNS: Temperature on admission was 99.4 and  currently 97.7, blood pressure 144 to 192 over 46 to 74 with one aberrant  reading of 82/40.  Heart rate 76 to 87, respirations 22 to 24, 91% to 92% on  room air.  GENERAL APPEARANCE: This is an obese white female lying in bed comfortably,  periodically appears to be come nauseated, but otherwise in no apparent  distress.  MENTAL STATUS:  Alert and oriented times three.  HEENT:  Pupils equal, round, and reactive to light, although small.  Extraocular movements intact. Dry mucous membranes. No lesions or ulcers  noted in her  oropharynx.  NECK:  Supple with no lymphadenopathy.  CARDIOVASCULAR:  Regular rate and rhythm.  No murmurs, rubs, or gallops.  LUNGS: This is difficult to assess secondary to her body habitus, but clear  to auscultation bilaterally as much as I can appreciate. I do not appreciate  wheezing or rhonchi.  ABDOMEN: Quite obese and appears somewhat distended, tympanic. No bowel  sounds and she is diffusely tender with questionable rebound. No guarding.  EXTREMITIES: She has 2+ pitting edema in her lower extremities, 2+ pulses  dorsalis pedis and radially.  RECTAL EXAM: Deferred is it had been done previously by the emergency room  physician and was guaiac negative. No Foley or rectal tube was in place.  MSK: There is questionable CVA tenderness on the right, but this was  difficult to assess secondary to body habitus and I presume it was somewhat  more related to her abdominal pain.  NEUROLOGIC: Cranial nerves II-XII intact.  Strength 5/5 and equal in all  extremities. Deep tendon reflexes are 2+.  Unable to assess gait secondary  to body habitus.  SKIN: Multiple ecchymosis in all extremities, but otherwise warm and dry. No  lesions or wounds  noted.   LABORATORY DATA:  White blood cell count 8.7, hemoglobin 10.6, hematocrit  32.6, platelet count 241,000.  Sodium 139, potassium 3.8, chloride 99,  bicarbonate 32, BUN 2, creatinine 0.8, glucose 159, calcium 8.9.  LFTs  within normal limits except her lipase was down at 14, total protein 6.1,  albumin 2.3. PT 13.4, INR 1.0, PTT 2.29. BNP elevated at 314.  She is guaiac  negative.  Urinalysis shows greater than 80 ketones, 100 protein, positive  nitrites, moderate leukocyte esterase, micro showed too many white blood  cells to count, many bacteria, and a few epithelial cells.   Preliminary chest x-ray reading showed cardiac enlargement and pulmonary  venous hypertension, and right upper lobe atelectasis. Abdominal x-ray showed marked gas  distention of the stomach without evidence of free air.   ASSESSMENT/PLAN:  This is a 70 year old white female with multiple medical  problems, recently discharged on April 15, 2006, for perforated duodenal  ulcer, now with intractable nausea and vomiting and a urinary tract  infection.  1.  Nausea and vomiting. Abdominal x-ray showed a large distended stomach      and NG tube returned feculent matter.  Etiology gastroparesis (diabetes      specifically) versus obstruction (small bowel obstruction) versus      questionable stricture or swelling from duodenal ulcer (NG  tube to      suction), IV fluids, D-5 normal saline at 125 mL an hour as she is      losing clear from gastric suction and will convert her to 100 mL an hour      after eight hours.  No potassium chloride for her now.  Will consider      obtaining an abdominal CT to check obstruction and will consult general      surgery in the morning.  We followed her on her last admission for      possible obstruction.  2.  Urinary tract infection. Vital signs were stable and she is currently      asymptomatic. However, given her nausea, vomiting, even though I think      it is likely related to the gastroparesis, we will go ahead and treat      her with Rocephin and follow her cultures.  We will consider adding      vancomycin for VRE as she was catheterized for two weeks during her last      admission. At this point her UTI is not likely to be a significant      problem and will likely narrow the coverage as the cultures return and      her vital signs remain stable. Continue IV fluids and follow her urine      cultures.  3.  Anemia. On her last admission studies were indicative of iron-deficiency      anemia. No obvious source of bleeding currently.  Her hemoglobin at      discharge, on April 16, 2006, was 9.5 and is currently 10.6, and she is      guaiac negative. Will hold her iron sulfate for now as she is stable. We      will need to  follow up with her PCP for this as this hopefully will be a      relatively short admission.  4.  Diabetes, type 2.  Will start Lantus 5 mg now and D-5 normal saline      while she is NPO.  Will also do sliding scale insulin. Likely to begin      insulin relatively soon once she is taking p.o.  5.  Hypertension.  Her blood pressure medications have been held since her      discharge as they were relatively controlled without them. Currently she      appears hypertensive at times.  Will continue to hold her blood pressure      medications for now until we can discern the cause of her nausea and      vomiting, and consider reinstituting them tomorrow if she remains      hypertensive.  6.  History of deep venous thrombosis, most recently in the right cephalic      vein on her last admission, currently not on anticoagulation secondary      to her bleeding ulcer and the low risk of embolization from her cephalic      vein.  Will start TED hose and SCDs for now and follow. 7.  History of pseudoseizures. Hold Tegretol and Neurontin for now until can      clear up her nausea and vomiting. Will need to monitor closely with      abrupt cessation of these two jobs. Will cover with Ativan and consider      restarting Tegretol in the morning.  8.  FEN, GI.  NPO for now.  NG tube to suction.  D-5 normal saline at 125 mL      per hour. Prophylaxis. Protonix 40 mg b.i.d. IV, SCDs, and Ativan p.r.n.      benzo withdrawal prophylaxis.     ______________________________  Lupita Raider, M.D.    ______________________________  Leighton Roach McDiarmid, M.D.    KS/MEDQ  D:  04/19/2006  T:  04/19/2006  Job:  [id number]   cc:   Dr. Verner Mould [facility]

## 2011-03-01 NOTE — Discharge Summary (Signed)
NAME:  Holly Kim, Holly Kim NO.:  [id number]   MEDICAL RECORD NO.:  [id number]          PATIENT TYPE:  INP   LOCATION:  6735                         FACILITY:  [facility]   PHYSICIAN:  Zenaida Deed. Mayford Knife, M.D.DATE OF BIRTH:  1941/01/11   DATE OF ADMISSION:  03/12/2006  DATE OF DISCHARGE:  03/18/2006                                 DISCHARGE SUMMARY   ADMIT DIAGNOSES:  1.  Altered mental status.  2.  Diabetes mellitus type 2 with complications including diabetic      retinopathy.  The patient is legally blind.  3.  Hypercholesterolemia.  4.  Obesity.  5.  Oppressive disorder.  6.  Hypertension.  7.  Arthritis.  8.  Cerebrovascular disease.  9.  History of DVT on the left after an ankle fracture.  10. History of decubitus ulcer.   DISCHARGE DIAGNOSES:  1.  Altered mental status secondary to contraction alkalosis with      hyponatremia and hypokalemia.  2.  Supratherapeutic INR.  3.  Urinary tract infection with greater than 100,000 Escherichia coli.      Escherichia coli was pansensitive.  4.  Diabetes mellitus type 2 with complications including diabetic      gastroparesis and diabetic retinopathy.  5.  The patient is legally blind.  6.  Pulmonary hypertension, likely secondary to sleep apnea and obesity      hypoventilation syndrome.  7.  L4-L5 degenerative anterolisthesis.  There is no pause defect, no      evidence of fracture.   PROCEDURES DURING HOSPITALIZATION:  1.  CT of the head on Mar 12, 2006 which showed findings compatible with      chronic small vessel disease involving the deep white matter, basal      ganglia, pons and cerebellum.  There is a remote right occipital lobe      infarction, but no acute intracranial abnormalities.  2.  Chest x-ray on Mar 12, 2006, which revealed cardiomegaly and changes      consistent with bronchitis.   HOSPITAL COURSE:  Holly Kim is a 70 year old with a history of a fall  one month ago who has been immobile  since this event.  The patient's family  reports mental status changes with decreased loss of consciousness and state  that the patient was unable to care for herself.  The patient was seen at  the outpatient by Dr. Cleophas Dunker and was admitted to the hospital for further  evaluation.  1.  Upon admission, the patient was found to be hyponatremic with a sodium      of 129 and a potassium of 3.3.  Per history, the patient had been      receiving her Lasix 30 mg b.i.d., as well as K-Dur supplement 20 mEq      daily, but had inadequate p.o. intake during this time.  Therefore, this      hyponatremia was thought to be hypo-osmolar hyponatremia, given the      patient's depleted volume status.  Therefore, the patient was hydrated      with normal saline boluses and placed on D-5 normal saline to correct  her electrolyte imbalances.  Her Lasix dose was held throughout this      hospitalization, and her potassium was repleted p.o., as well as IV.      Prior to discharge, the patient's sodium was 136 with a potassium of      3.6, chloride of 93, CO2 of 36, BUN of 9, and creatinine of 0.8.  2.  Also, for the altered mental status, CT of the head was obtained, and      results are listed above.  The patient's mental status did come around      to baseline after electrolyte abnormalities were repleted.  For altered      mental status, the patient does have a history of pseudoseizures, and      has been on Tegretol 200 mg p.o. daily, although the patient has denied      any seizure activity in quite some time.  A Tegretol level was obtained      and was found to be 4.6 with therapeutic range being 4 and 12.  3.  The patient was also, during this hospital course, found to have a      supratherapeutic INR after being treated for a urinary tract infection.      Urine culture did grow out greater than 100,000 E. coli that was      pansensitive.  The patient did receive 1 dose of Bactrim, and this was       discontinued secondary to her INR increasing.  She was, therefore,      placed on Macrobid 50 mg to take p.o. q.i.d. for a total of a 7-day      course.  The patient will be done with this course on March 20, 2006, and      prior to discharge the patient's PT was 30.3 with an INR of 3.9.  She      was restarted on Coumadin at 5 mg p.o. daily, and will have a PT INR      checked tomorrow prior to discharge.  Per history of her primary care      physician, the patient does have a history of DVT and questionable      pulmonary emboli after a left ankle fracture surgery in July 2004.  Of      note, primary care physician does recommend that the patient continue on      Coumadin due to her dysmobility, although, at this time, she does not      have any other indication for Coumadin.  4.  For diabetes, the patient's CBGs prior to discharge were 106 fasting      with a range of 114-185.  The patient's hemoglobin A1C is 7.1.  She will      continue on Actos 30 mg daily, as well as NovoLog 70/30, 45 units in the      morning and 35 units at dinner time.  Of note, the patient is not on an      ACE inhibitor; however, we will start enalapril 5 mg p.o. daily.  The      patient had hypotension listed as a medication allergy to ACE      inhibitors.  We will see how the patient tolerates this overnight.  5.  Given the patient's pulmonary hypertension, her last echocardiogram was      in October of 2006 and showed an ejection fraction of 55-65% with      overall normal left ventricular function;  however, her pulmonary artery      pressure was elevated at 52.  We will continue Norvasc 5 mg daily, as      well as the ACE inhibitor.  If the patient has not had a formal sleep      study, we recommend that the primary care physician do this, as her      pulmonary hypertension is likely related to obesity hypoventilation      syndrome versus obstructive sleep apnea. 6.  For acute-on-chronic back pain, given the  patient's history of fall,      films were obtained of the lower back, which showed lumbar spondylosis,      but not acute fractures.  The patient will continue on a Lidoderm patch      5%, as well as Tylenol 650 mg every 4-6 hours as needed for pain.  She      also has morphine MSIR 15 mg p.o. every 6 hours as needed for severe      pain.  7.  For chronic constipation, this may be related to the patient's diabetic      gastroparesis.  Will continue Reglan 10 mg p.o. a.c. and h.s. with each      meal, as well as her current bowel regimen of Senna on tablet p.o.      b.i.d., as well as lactulose 30 mL p.o. b.i.d.  The patient was having      adequate bowel movements prior to discharge.  Stated she had a last      bowel movement of normal consistency one day prior to discharge.  8.  For diabetic neuropathy, the patient will continue adequate glucose      control, as described above, in addition to 600 mg of Neurontin p.o.      b.i.d.  9.  For infectious disease, urinary tract infection as detailed above, and      continue Nizoral 2% cream to pannus fold.  10. Fluids, electrolytes, and nutrition.  The patient was saline locked on      her IV fluids.  Electrolytes stable at discharge.  BMET is pending on      the day of discharge.  She is to be compliant with a diuretic diet.   DISCHARGE MEDICATIONS:  1.  Norvasc 5 mg p.o. daily.  2.  Risperdal 0.5 mg at bedtime.  3.  Neurontin 600 mg p.o. b.i.d.  4.  Percocet 5/325 one tablet every 3 hours as needed for pain.  5.  Tylenol 325 mg every 6 hours as needed for pain.  6.  Xanax 0.25 mg p.o. q.h.s.  7.  Reglan 10 mg by mouth before meals and at bedtime four times daily.  8.  NovoLog 70/30, 45 unit injection in the morning and 35 unit injection      with dinner.  9.  Prilosec 20 mg p.o. daily.  10. Lactulose 30 mL by mouth b.i.d.  11. Macrobid 50 mg q.i.d. until March 20, 2006.  12. Nizoral cream to skin folds daily.  13. Coumadin 5 mg by  mouth daily.  The patient to have PT INR checked on      March 22, 2006.  14. Enalapril 5 mg p.o. daily.  15. We will hold the patient's Lasix at this time.   FOLLOWUP:  She is to follow up with Dr. Cleophas Dunker at [facility] and given the number, [phone], within 3-5 days.  They were  instructed to return to  the emergency department or clinic for altered  mental status, nausea, vomiting, inability to take fluids or food by mouth,  dysuria, fever, or any other complaints.      Alanson Puls, M.D.    ______________________________  Zenaida Deed. Mayford Knife, M.D.    MR/MEDQ  D:  03/17/2006  T:  03/17/2006  Job:  [id number]

## 2011-03-01 NOTE — Discharge Summary (Signed)
NAME:  Holly Kim, DEACON NO.:  [id number]   MEDICAL RECORD NO.:  [id number]          PATIENT TYPE:  INP   LOCATION:  5738                         FACILITY:  [facility]   PHYSICIAN:  Santiago Bumpers. Hensel, M.D.DATE OF BIRTH:  [birth year]   DATE OF ADMISSION:  04/18/2006  DATE OF DISCHARGE:  05/02/2006                           DISCHARGE SUMMARY - REFERRING   ATTENDING PHYSICIAN:  Santiago Bumpers. Hensel, M.D.   DISCHARGE DIAGNOSES:  1.  Diabetic gastroparesis.  2.  Diabetes mellitus.  3.  Hypertension.  4.  Morbid obesity.  5.  Iron-deficiency anemia.  6.  Borderline personality disorder.  7.  Depression.  8.  Pseudoseizures.  9.  History of deep venous thrombosis and pulmonary embolus.  10. Diabetic retinopathy.  11. Hyperlipidemia.  12. Tardive dyskinesia.  13. Protein malnutrition.  14. Urinary tract infection.  15. Chronic low back pain.  16. Pulmonary hypertension.   DISCHARGE MEDICATIONS:  1.  Norvasc 10 mg p.o. daily.  2.  Cozaar 50 mg p.o. daily.  3.  Iron sulfate 325 mg p.o. three times a day.  4.  NovoLog 4 units subcu t.i.d. with meals.  5.  Reglan 10 mg by mouth with meals and at bedtime.  6.  Flagyl 500 mg by mouth t.i.d. to stop May 12, 2006.  7.  MS Contin 15 mg p.o. q.12h.  8.  Protonix 40 mg p.o. b.i.d. until May 12, 2006, then daily.  9.  Risperdal 0.5 mg p.o. every night.  10. Tetracycline 500 mg p.o. q.i.d. to stop May 12, 2006.  11. Phenergan 25 mg p.o. t.i.d. p.r.n. nausea.  12. Pepto Bismol 30 mL p.o. q.i.d. to stop May 12, 2006.  13. Lantus 18 units subcu nightly and 18 units subcu every morning.  14. Vicodin 1 tablet p.o. q.6h. p.r.n. pain.  15. Lasix 20 mg p.o. b.i.d.   FOLLOW UP INSTRUCTIONS:  The patient is to follow up with Dr. Merilynn Finland who  is her primary care physician at the [facility].   PROCEDURES:  1.  April 18, 2006, acute abdominal series and chest x-ray showed      cardiomegaly, pulmonary hypertension and  gaseous distention of the      stomach.  2.  April 19, 2006, CT of the abdomen and pelvis showed improved appearance      of the stomach, resolving free intraperitoneal air, stable pancreatic      atrophy.  3.  April 19, 2006, a chest x-ray showed cardiomegaly and pulmonary      hypertension without evidence for congestive heart failure.  4.  April 21, 2006, UGI series showed no evidence of gastric outlet      obstruction, decreased gastric motility and gastritis.  5.  Chest x-ray obtained April 22, 2006, showed left PICC line with no other      changes.  6.  April 25, 2006, abdominal x-ray showed no evidence of fecal impaction.   CONSULTATIONS:  1.  Dr. Christella Hartigan, GI, April 19, 2006.  2.  General surgery, April 19, 2006.   HOSPITAL COURSE:  Holly Kim is a 70 year old white female with a  history of  a perforated duodenal ulcer in June 2007, which was treated  medically.  She was discharged from the hospital April 15, 2006, and returned  April 18, 2006, with intractable nausea, vomiting and abdominal pain for one  day.   PROBLEM #1 -  NAUSEA, VOMITING AND ABDOMINAL PAIN:  Upon admission, an  abdominal x-ray showed distended stomach.  An NG tube was inserted and  returned 500 mL of feculent material.  A CT scan of the abdomen the next  morning showed improved stomach distention, resolving free intraperitoneal  air, and no signs of obstruction.  The patient was made NPO and started on  Protonix 40 mg IV b.i.d. as well as Zofran p.r.n. nausea.  She was also  started on tigecycline 50 mg every 12 hours to cover for gut flora, however,  this antibiotics was discontinued after two days since there was no evidence  of an infectious process.  Dr. Christella Hartigan from GI was consulted April 19, 2006,  He recommended a UGI barium study which showed no evidence of gastric outlet  obstruction.  An EGD was not performed secondary to high risk for  perforation in the setting of recent duodenal ulcer.  The etiology  for the  patient's nausea and vomiting is likely diabetic gastroparesis.  She was  started on IV erythromycin as well as Reglan to help with gastric mobility.  The patient does have tardive dyskinesia on physical examination, however,  she will be continued on Reglan p.o. as the tardive dyskinesia is likely  permanent and she certainly requires Reglan in order to tolerate p.o.  intake.  The patient's NG tube was removed and her diet was advanced very  slowly.  [name] seemed somewhat afraid to eat secondary to the fear  of pain and emesis.  She weakness tolerating excellent oral intake prior to  discharge.  She had been started on TNA for nutrition and that was  discontinued several days prior to discharge.  She will be discharged on  Reglan 10 mg p.o. with meals and every night.  She was also found to be H.  pylori positive, so she will require a 14-day treatment course which will  end May 12, 2006.  Her treatment for H. pylori consists of Pepto Bismol,  tetracycline, Flagyl and Protonix twice daily.   PROBLEM #2 -  DIABETES MELLITUS TYPE 2:  Good control of the patient's blood  glucose is mandatory in order to minimize gastroparesis symptoms.  She was  started on Lantus 5 units every night upon admission since she was NPO.  Her  insulin regimen was slowly increased as her oral intake increased.  She will  be discharged on Lantus 18 units every morning and every night as well as  NovoLog 4 units three times a day with meals.  She has maintained excellent  blood glucose numbers with this regimen.  The patient's primary care  physician may opt to increase her insulin as her oral intake improves.   PROBLEM #3 -  HYPERTENSION:  Holly Kim has a history of hypertension.  Her blood pressure medications were stopped after her last discharge from  the hospital because she had good blood pressure control at that time.  The patient did have systolic blood pressures in the 160s this  admission and she  was started on Norvasc 10 mg p.o. daily.  She still did not have adequate  control of blood pressures as systolic is in the 140s, so she was started on  Cozaar  50 mg p.o. daily.  An angiotensin receptor blocker was chosen instead  of an ACE inhibitor because she has a listed allergy to ACE inhibitors which  is renal failure.  She will also be discharged on Lasix 20 mg twice daily  for lower extremity edema.  BNP was within normal limits and a chest x-ray  showed no signs of congestive heart failure.   PROBLEM #4 -  URINARY TRACT INFECTION:  A urinalysis on admission was  suggestive of a urine infection.  Urine culture grew Proteus.  The patient  was not symptomatic.  She was started on Ancef 1 g IV every six hours.  She  was treated for a total course of 10 days for her complicated UTI.  Her  Foley catheter was removed two days prior to discharge.   PROBLEM #5 -  ANEMIA:  Her hemoglobin on admission was 10.6.  Iron studies  were obtained and iron was 20, ferritin was 46.  She was Hemoccult negative.  She was infused with 1.5 g iron dextran after a 25 mg test dose.  Her  hemoglobin was stable at 8 at the time of discharge.  She will continue  ferrous sulfate 325 mg p.o. t.i.d. as an outpatient.   PROBLEM #6 -  PAIN:  The patient has a history of chronic back pain since a  fall six months ago.  She was discharged at last admission with [facility] on  April 15, 2006.  Upon this admission, she received Dilaudid IV every six  hours p.r.n.  Her pain regimen was changed to MS Contin 15 mg p.o. q.12h. in  order to achieve a more long-lasting narcotic effect.  She was also started  on Vicodin one tablet q.6h. p.r.n. breakthrough pain.  This appears to be an  adequate pain regimen for Ms. Ohern.   PROBLEM #7 -  HISTORY OF DEEP VENOUS THROMBOSIS AND PULMONARY EMBOLUS:  The  patient had been on Coumadin prior to her first hospitalization in June.  This was held since she had  the history of an ulcer.  The patient will not  go home on anticoagulation because her ultimate goal is to become more  mobile and she is certainly a significant fall risk.  She did wear  sequential compression devices throughout this admission for DVT  prophylaxis.   PROBLEM #8 -  HYPOKALEMIA:  The patient had a potassium of 2.7 on admission.  This was repleted with oral and IV potassium.  Her potassium was stable and  4.1 the day of discharge.     ______________________________  Sylvan Cheese, M.D.    ______________________________  Santiago Bumpers. Leveda Anna, M.D.    MJ/MEDQ  D:  05/02/2006  T:  05/02/2006  Job:  [id number]   cc:   [facility]

## 2011-03-01 NOTE — Consult Note (Signed)
[place]  Patient:    Holly Kim, Holly Kim Visit Number: [id number] MRN: [id number]          Service Type: MED Location: [phone] Attending Physician:  McDiarmid, Leighton Roach. Dictated by:   Genene Churn. Barry Dienes, P.A.-C. Proc. Date: 03/13/02 Admit Date:  03/06/2002                            Consultation Report  REQUESTING PHYSICIAN:  Dr. Merilynn Finland.  REASON FOR CONSULTATION:  This orthopedic consultation is being requested on a 70 year old white female who is status post right proximal humerus fracture a couple weeks ago.  The patient was recently discharged home and she was given Percocet for her pain.  The patient states that she was taking Percocet two tablets q.3h.  The patient had a couple of falls and she was brought back to the hospital with question of reinjury.  She was last seen by Dr. Criss Alvine at the time of her injury and she was put in a shoulder immobilizer.  Currently, she complains of some right shoulder pain and denies numbness and tingling.  She has multiple medical problems and multiple medications.  RADIOLOGY:  X-rays were reviewed which showed a right comminuted 3- to 4-part proximal humerus fracture.  There were no changes from previous films.  PHYSICAL EXAMINATION:  Vital signs stable, afebrile.  Right shoulder immobilizer in place.  She is neurovascularly intact.  She does have discomfort with attempted movement.  ASSESSMENT:  Right proximal humerus fracture.  PLAN:  We will have the patient continue to use the shoulder immobilizer for 4-6 more weeks.  She may work on some pendulum exercises.  Can use a 4-prong cane in the left hand to help with her stability.  She was advised not to take the Percocet as frequently as she has been.  She did state that she understands this and I have explained to her that this is most likely the cause of her falling.  She will follow up in our office in 1 week and we will repeat  x-rays. Dictated by:   Genene Churn. Barry Dienes, P.A.-C. Attending Physician:  McDiarmid, Tawanna Cooler D. DD:  03/13/02 TD:  03/15/02 Job: [id number]

## 2011-03-01 NOTE — Discharge Summary (Signed)
NAME:  Holly Kim, Holly Kim                   ACCOUNT NO.:  [id number]   MEDICAL RECORD NO.:  [id number]                   PATIENT TYPE:  INP   LOCATION:  5730                                 FACILITY:  [facility]   PHYSICIAN:  Rosanne Sack, M.D.         DATE OF BIRTH:  [birth year]   DATE OF ADMISSION:  03/25/2004  DATE OF DISCHARGE:  03/28/2004                                 DISCHARGE SUMMARY   PRIMARY CARE PHYSICIAN:  Maxwell Caul, M.D.   DISCHARGE DIAGNOSES:  1. Nausea and vomiting related to diabetic gastroparesis, resolved.  2. Uncontrolled diabetes, improved.  3. History of pulmonary embolism on chronic Coumadin therapy.  4. Hypertension.  5. Chronic constipation.  6. Chronic lower back pain.  7. Depression.  8. Recurrent urinary tract infections last one being Enterococcus.  9. Acute renal failure in December of 2004.  10.      Gastritis, H.pylori positive, subsequently treated with     Metronidazole, Bactrim, and Protonix.  11.      Morbid obesity.  12.      Fracture of her right ankle in July of 2004.  13.      Ischemic cardiomyopathy.  14.      Diabetic neuropathy and retinopathy.  15.      Anemia of chronic disease.  16.      History of transient ischemic attacks x5.  17.      History of cerebrovascular accident.  18.      Tremor.  19.      History of seizure.  20.      Chronic edema.  21.      Coronary artery disease.  22.      Gastroesophageal reflux disease.  23.      Bronchospasm.  24.      Right bundle branch block.  25.      Cholelithiasis on x-ray of the abdomen in 2004.  26.      Right prepatellar bursitis in 2003.  27.      Osteoarthritis.  28.      Fracture of her right humerus in 2003.  29.      Hypercholesterolemia.   DISCHARGE MEDICATIONS:  1. Duragesic patch 25 mcg apply every three days. Present patch is to be     removed on March 30, 2004.  2. Coumadin 8 mg tonight only.  3. Lovenox 145 mg every 12 hours until INR is therapeutic and  stable.  4. Neurontin 600 mg twice a day.  5. Tegretol 200 mg daily.  6. Trazodone 150 mg at bedtime.  7. Senokot one tablet twice a day.  8. Norvasc 5 mg daily.  9. Reglan 10 mg before meals and at bedtime.  10.      Lasix 40 mg daily.  11.      Protonix 40 mg daily.  12.      Novolin NPH Insulin 20 units in the morning before breakfast and 14  units before supper.  13.      Sliding scale insulin with Novolin Regular before meals and at     bedtime, 0 to 150 no units, 151 to 200 two units, 201 to 250 four units,     251 to 300 six units, 301 to 350 eight units, 351 to 400 10 units,     greater than 400 give 12 units and call M.D.  14.      Tylenol 650 mg every four hours as needed for mild pain or fever.  15.      Ativan 1 mg every six hours as needed for anxiety.   DISPOSITION:  The patient is being discharged back to [facility].   CONDITION ON DISCHARGE:  Stable.   DISCHARGE INSTRUCTIONS:  She should have daily PT and INR's to be called to  the facility [name] with further Coumadin dosage to be done by the  facility [name].  She should have CBG's before meals and at bedtime.  Her  previous diet can be resumed.  PT and OT are to evaluate and treat as  anticipated.   CONSULTATIONS:  None this hospitalization.   PROCEDURE:  1. Upper GI series on March 27, 2004, revealed a dilated aperistaltic     stomach. The findings are consistent with severe gastroparesis.  The     study was limited due to the patient's condition, but there appeared to     be no gastric or duodenal ulcers, obstructing lesions, or other     significant findings.  2. A two-dimensional echocardiogram on March 26, 2004, revealed overall left     ventricular systolic function was normal, left ventricular ejection     fraction was 55 to 65%, left ventricular wall thickness was mildly     increased, aortic valve was mildly calcified.  The estimated peak right     ventricular systolic  pressure was within upper limits of normal.  There     appeared to be a small pericardial effusion posterior to the heart.   DISCHARGE LABORATORY DATA:  WBC 7.7, RBC 3.86, hemoglobin 10.5, hematocrit  31.5, MCV 81.5, platelets 200, PT 18.1, INR 1.8, sodium 139, potassium 4.2,  chloride 98, CO2 31, glucose 174, BUN 11, creatinine 0.9, calcium 8.9.  Urinalysis was negative.  Urine culture was negative.  Fecal occult blood  was negative.   HISTORY OF PRESENT ILLNESS:  This 70 year old female who is a resident of  [facility] and is under the primary care of Maxwell Caul, M.D.  The patient was admitted through [facility] ER after being sent  from the nursing home with coffeeground emesis.  On the day of admission,  the patient became acutely ill and had started vomiting throughout the day  despite being given Phenergan.  Her last emesis was noted to be coffeeground  per the nursing report.  She was admitted for further evaluation and  treatment.   HOSPITAL COURSE:  Problem 1.  Nausea and vomiting secondary to diabetic  gastroparesis.  The patient was admitted, provided with IV fluids for  hydration.  She was kept NPO initially.  Amylase and lipase were done, both  of which were low.  An upper GI series was done to rule out obstruction with  results as noted above.  The patient was placed on Reglan initially IV with  a good response.  She had no further vomiting.  She was started on a liquid  diet and was  tolerating this well before discharge.   Problem 2.  Uncontrolled diabetes mellitus.  The patient has a history of  diabetes.  She had some elevated CBG readings.  Her regimen was changed and  she was put on NPH twice a day with sliding scale insulin coverage.  Prior  to discharge, her CBG's had improved some in the mid-100's range.  Further  titration of her medications will be left to the facility M.D.  Problem 3.  History of PE on chronic Coumadin therapy.  Due to  the question  of some coffeeground emesis and possible GI bleed, her Coumadin was held  initially, but was restarted along with Lovenox.  The Lovenox can be  discontinued when her INR is therapeutic and stable.   Problem 4.  Hypertension.  Her blood pressure was uncontrolled during this  hospitalization and Norvasc was added to her regimen, with good response.  Her blood pressure is 132/50 at discharge.   Problem 5.  Chronic constipation.  Senokot was added to her medication  regimen.  Further treatment of this will be left to the facility M.D.   Problem 6.  Chronic lower back pain/depression.  Some of her medications  were held on admission.  Her Neurontin and Tegretol were restarted prior to  discharge.  She was also started on a Duragesic patch at 25 mcg per hour.   Discharge time was 35 minutes.      Stephanie Swaziland, NP                      Rosanne Sack, M.D.    SJ/MEDQ  D:  03/28/2004  T:  03/28/2004  Job:  [id number]   cc:   Maxwell Caul, M.D.  [address]  [place]  [place] [address]  Fax: [phone]

## 2011-03-01 NOTE — Discharge Summary (Signed)
NAMENAKAYA, MISHKIN NO.:  [id number]   MEDICAL RECORD NO.:  [id number]          PATIENT TYPE:  INP   LOCATION:  6735                         FACILITY:  [facility]   PHYSICIAN:  Melina Fiddler, MD DATE OF BIRTH:  02-22-1941   DATE OF ADMISSION:  03/12/2006  DATE OF DISCHARGE:  03/18/2006                                 DISCHARGE SUMMARY   ADDENDUM:  Discharge medications were changed as follows.  Enalapril 5 mg p.o. was  discontinued per patient's primary care doctor.  Patient has developed acute  renal failure on ACE inhibitors; therefore, this was discontinued.  The  patient was started on Coumadin at her home dose of 5 mg p.o. daily.  PT/INR  prior to discharge PT of 21.3 and INR of 1.8.  The patient will need a  repeat INR by March 21, 2006.  The patient was continued on Coumadin secondary  to history of pulmonary embolism as well as DVT after an ankle fracture.  This is per primary care physician's request to keep this medicine on-board.   DISCHARGE LABORATORIES:  Sodium 140, potassium 3.8, chloride 95, CO2 37, BUN  12, creatinine 0.9, glucose 217.  PT of 21.3, INR of 1.8.      Alanson Puls, M.D.    ______________________________  Melina Fiddler, MD    MR/MEDQ  D:  03/18/2006  T:  03/18/2006  Job:  [id number]

## 2011-03-01 NOTE — H&P (Signed)
[place]. [facility]  Patient:    Holly Kim, Holly Kim Visit Number: [id number] MRN: [id number]          Service Type: MED Location: [phone] Attending Physician:  McDiarmid, Leighton Roach. Dictated by:   Harrold Donath, M.D. Admit Date:  03/06/2002   CC:         Solon Palm, M.D., [facility]   History and Physical  CHIEF COMPLAINT: Fall and immobility.  HISTORY OF PRESENT ILLNESS: The patient is a 70 year old morbidly obese female, with a history of fall two weeks ago with a broken shoulder versus right arm fracture, who fell x2 today trying to get from her bedside commode to her lift chair.  She fell onto her right knee both times.  She was seen by Dr. Roger Shelter on Thursday, who offered to have the patient admitted to the extended care center for more permanent daily care, but the patient refused. She now feels it is a good idea since she is almost immobile and only has her son and a home health aide to assist her.  REVIEW OF SYSTEMS: Negative except for some orthopnea, which is long-standing, recent constipation, and long-standing tremors.  PAST MEDICAL HISTORY:  1. Diabetes mellitus, type 2.  2. Hypercholesterolemia.  3. Obesity.  4. Borderline personality.  5. Depression.  6. Peripheral neuropathy.  7. Hypertension.  8. History of cerebrovascular accident.  9. Coronary artery disease. 10. 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  MEDICATIONS:  1. Actos 30 mg p.o. q.d.  2. Aspirin 81 mg p.o. q.d.  3. Astelin nasal spray q.d.  4. Darvocet one p.o. q.6h p.r.n.  5. Hydrochlorothiazide 12.5 mg q.a.m.  6. Insulin 70/30 55 units in the morning, 30 units at night.  7. K-Dur 20 mEq q.d.  8. Lasix 80 mg in the morning, 40 mg at night.  9. Lotensin 20 mg p.o. q.a.m. 10. Neurontin 600 mg p.o. b.i.d. 11. Prevacid 30 mg p.o. q.a.m. 12. Risperdal 1.5 mg p.o. q.h.s. 13. Senokot-S 2 mg p.o. b.i.d. 14. Tegretol 200 mg  p.o. b.i.d. 15. Zoloft 50 mg p.o. q.d. 16. Vicodin two tablets p.o. q.4h for the past two weeks.  ALLERGIES:  1. IBUPROFEN.  2. HALDOL.  3. AUGMENTIN.  SOCIAL HISTORY: The patient lives with her son.  She has a home health aide during the morning and has no care in the afternoon until her son returns home from work.  She does minimal activity and mostly is confined to her wheelchair.  No smoking or alcohol.  FAMILY HISTORY: Significant for breast cancer in her aunt.  PHYSICAL EXAMINATION:  VITAL SIGNS: TEMP 98.8 degrees, blood pressure 129/59, heart rate 72, respirations 20; saturation 95% on room air.  GENERAL: She was sad and cried on examination, but otherwise in no acute distress.  She did have some tremors on examination.  HEENT: PERRL.  Oropharynx clear without erythema or exudate.  NECK: No lymphadenopathy, no thyromegaly.  LUNGS: Decreased breath sounds secondary to her body habitus but no crackles, wheezes, or rhonchi.  CARDIOVASCULAR: Distant heart sounds, 2/6 systolic ejection murmur at the small bowel but regular rate and rhythm.  ABDOMEN: Positive bowel sounds.  Soft, nontender.  Morbidly obese.  EXTREMITIES: She had trace to 1+ pitting edema to her bilateral knees.  Her right knee had a small abrasion on it and was edematous.  NEUROLOGIC: Examination nonfocal.  SKIN: No signs of decubitus ulcers or rash.  LABORATORY DATA: Her tibial and right knee film  showed no fractures.  ASSESSMENT/PLAN: This is a 70 year old with:  1. Immobility/falls/morbid obesity.  The patients body habitus makes care at     home difficult if not impossible.  She was agreeable to placement at the     extended care center.  Will consult case management for assistance.  For     pain she has been taking Vicodin two tablets q.4h so will dose intravenous     morphine at an equal analgesic dose.  2. Constipation.  The patient was on no bowel regimen except for an oclock     Senokot,  with daily Vicodin use for two weeks.  Will start Fleet enemas,     MiraLax, and Colace.  3. Diabetes.  The patient has a history of diabetes mellitus, type 2.  We     will start her on her home regimen and cover with sliding-scale insulin if     necessary.  4. Hypertension.  This is tuberculosis currently.  Will continue with home     regimen.  5. Tremors/tardive dyskinesia/history of possible seizure disorder.  Will     continue her home regimen of Tegretol and Neurontin.Dictated by:   Harrold Donath, M.D. Attending Physician:  McDiarmid, Tawanna Cooler D. DD:  03/06/02 TD:  03/07/02 Job: [id number]

## 2011-03-01 NOTE — Op Note (Signed)
NAME:  Holly Kim, DEPOY NO.:  [id number]   MEDICAL RECORD NO.:  [id number]          PATIENT TYPE:  OIB   LOCATION:  5714                         FACILITY:  [facility]   PHYSICIAN:  Alford Highland. Rankin, M.D.   DATE OF BIRTH:  12-Sep-1941   DATE OF PROCEDURE:  02/04/2005  DATE OF DISCHARGE:  02/05/2005                                 OPERATIVE REPORT   PREOPERATIVE DIAGNOSES:  1.  Dense vitreous hemorrhage, left eye, nonclearing.  2.  Progressive proliferative diabetic retinopathy, left eye.   POSTOPERATIVE DIAGNOSES:  1.  Dense vitreous hemorrhage, left eye, nonclearing.  2.  Progressive proliferative diabetic retinopathy, left eye.   OPERATION PERFORMED:  Posterior vitrectomy with endolaser  panphotocoagulation, left eye.   SURGEON:  Alford Highland. Rankin, M.D.   ANESTHESIA:  Local retrobulbar with monitored anesthesia control.   INDICATIONS FOR PROCEDURE:  The patient is a 70 year old woman who has  profound vision loss on the basis of dense nonclearing vitreous hemorrhage  of the left eye. This is an attempt to clear the vitreous hemorrhage and  opacifications  so as to allow enhanced and improved visual functioning and  ambulatory care and quiescence of retinopathy.  The patient understands the  risks of anesthesia including the rare occurrence of death but also to the  eye including but not limited to hemorrhage, infection, scarring, need for  another surgery, no change in vision, loss of vision and progression of  disease despite intervention.  After appropriate signed consent was  obtained, the patient was taken to the operating room.   DESCRIPTION OF PROCEDURE:  In the operating room, appropriate monitoring was  followed by mild sedation.  0.75% Marcaine was delivered 5 mL retrobulbar to  the left eye followed by an additional 5 mL laterally in the fashion of  modified Darel Hong.  The left periocular region was sterilely prepped and  draped in the usual ophthalmic  fashion.  A lid speculum was applied.  Conjunctival peritomy was fashioned superiorly and infratemporally.  A 4 mm  infusion secured 3.5 mm posterior to the limbus in the infratemporal  quadrant.  Placement in the vitreous cavity verified visually.  Superior  sclerotomies then fashioned.  Wild microscope was placed in position with  the Biom attachment.  Core vitrectomy was then begun.  All vitreous  opacification was removed.  Hyaloid was removed and circumcised 360 degrees.  Endolaser panphotocoagulation was placed 360 degrees.  Instruments were  removed from  the eye.  Superior sclerotomies were then closed.  Conjunctiva closed.  The  infusion removed and similarly closed with 7-0 Vicryl.  Subconjunctival  injection of antibiotic and steroid applied.  The patient tolerated the  procedure well without complication.      Alford Highland Rankin, M.D.  Electronically Signed     GAR/MEDQ  D:  05/20/2005  T:  05/21/2005  Job:  [id number]

## 2011-03-01 NOTE — Discharge Summary (Signed)
Holly Kim NO.:  [id number]   MEDICAL RECORD NO.:  [id number]          PATIENT TYPE:  INP   LOCATION:  5031                         FACILITY:  [facility]   PHYSICIAN:  Leighton Roach McDiarmid, M.D.DATE OF BIRTH:  [birth year]   DATE OF ADMISSION:  04/03/2006  DATE OF DISCHARGE:  04/15/2006                                 DISCHARGE SUMMARY   DISCHARGE DIAGNOSES:  1.  Perforated duodenal ulcer.  2.  Diabetes mellitus type 2.  3.  Hypertension.  4.  History of deep vein thrombosis/pulmonary embolism.  5.  Pulmonary hypertension.  6.  Diabetic retinopathy.  7.  Diabetic gastroparesis.  8.  Anemia.  9.  Obesity.  10. Hyperlipidemia.  11. Arthritis.  12. Borderline personality disorder.  13. Depressive disorder.  14. Pseudoseizures.   DISCHARGE MEDICATIONS:  1.  Lasix 20 mg p.o. b.i.d.  2.  Tegretol 200 mg p.o. daily.  3.  Neurontin 600 mg p.o. b.i.d.  4.  NovoLog 70/30 45 units in the morning and 35 units at night.  5.  Prilosec 20 mg p.o. daily.  6.  Risperdal 0.5 mg p.o. every night.  7.  Lactulose 30 mL p.o. b.i.d. p.r.n. constipation.  8.  Reglan 10 mg p.o. with every meal and every night.  9.  Xanax 0.25 mg p.o. nightly.  10. Percocet 5/325 take 1 tablet t.i.d. p.r.n. pain.   FOLLOWUP APPOINTMENT:  The patient is to follow up with Dr. Merilynn Finland at the  [facility] nursing facility.   PROCEDURES:  1.  April 03, 2006, chest x-ray showing cardiomegaly without signs of      failure.  2.  April 04, 2006, CT scan of abdomen and pelvis showing free      intraperitoneal air, consistent with GI perforation and concerning for      peptic ulcer disease and older perforation.  3.  April 04, 2006, chest x-ray showing PICC line in right arm with tip in      superior vena cava.  4.  April 07, 2006, CT of abdomen and pelvis showing decrease in gastric wall      gas and minimal intraperitoneal air.  5.  April 04, 2006, PICC line placement.  6.  April 10, 2006, upper GI  series with small bowel follow-through      consistent with gastritis.  7.  April 04, 2006, patient was transfused with 2 units of fresh frozen      plasma.   CONSULTS:  1.  April 04, 2006, Dr. Violeta Gelinas, general surgery.  2.  April 07, 2006, nutrition consult.   HOSPITAL COURSE AND PERTINENT LABORATORY RESULTS:  42.  A 70 year old white female with multiple medical problems (hypertension,      diabetes, history of DVT/PE, CVA, seizures, borderline personality      disorder), presented from [facility] nursing facility with upper GI      bleeding and hematemesis.  Found to have perforated duodenal ulcer on      abdominal CT scan performed upon admission.  H. pylori was negative.      The patient was on Coumadin as an outpatient for history of  PE/DVT.      Anticoagulability reversed with 2 units FFP and vitamin K x1 dose.  The      patient was started on Protonix 40 mg b.i.d., made n.p.o., and NG tube      was placed.  She was followed medically along with general surgery      consulting.  PICC line was inserted for TNA.  The patient was stable on      repeat CT scans, April 07, 2006, showing improvement in gastric wall air.      Gastrografin study performed April 10, 2006, was consistent with      gastritis and no perforation in the stomach.  Hemoglobin remained stable      throughout admission.  NG tube was discontinued and diet was advanced      slowly to regular diet by the time of discharge.  Hemoglobin at the time      of discharge was 10.3.  2.  Diabetes mellitus type 2:  The patient was initially started on sliding      scale insulin only upon admission, secondary to n.p.o. status.  Lantus      was added gradually and increased as the patient's p.o. intake improved.      Upon discharge the patient will resume home insulin dose.  3.  Hypertension:  The patient had excellent control of blood pressure      throughout hospital admission.  She will not be sent home on any blood       pressure medications.  She is to follow up on her blood pressure with      the attending physician at the nursing home facility.  4.  History of pseudoseizures:  The patient was maintained on Tegretol,      Dilantin, and Neurontin throughout hospital stay.  No seizure activity      was witnessed during the admission.  5.  History of DVT/PE:  The patient's home dose of Coumadin was held at      admission due to bleeding ulcers.  The patient's PCP will need to assess      benefits and risks of restarting blood thinner in light of recent upper      GI bleeding.  6.  DVT in right upper extremity:  DVT was detected with right upper      extremity ultrasound on April 14, 2006.  The patient did receive 1      treatment dose of Lovenox the evening of April 14, 2006; however, Lovenox      was discontinued prior to discharge for feeling that clot in cephalic      vein is unlikely to embolize.  7.  Anemia.  Hemoglobin was 11.5 upon admission, 10.3 at discharge.  Iron      studies indicate iron deficiency anemia.  Fetal occult blood test was      negative.  The patient's primary care physician should consider addition      of iron sulfate to patient's medication regimen.     ______________________________  Sylvan Cheese, M.D.    ______________________________  Leighton Roach McDiarmid, M.D.    MJ/MEDQ  D:  04/15/2006  T:  04/15/2006  Job:  [id number]   cc:   Merilynn Finland, Dr.  [facility]
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
## 2011-03-04 ENCOUNTER — Telehealth: Payer: Self-pay | Admitting: *Deleted

## 2011-03-04 NOTE — Telephone Encounter (Signed)
Message copied by Jimmy Footman on Mon Mar 04, 2011 11:21 AM ------      Message from: [place], SARAH      Created: Wed Feb 27, 2011  1:17 PM       Please call Holly Kim and let her know she does have a urine infection, and I called in an antibiotic to [facility].  THanks!

## 2011-03-04 NOTE — Telephone Encounter (Signed)
Informed patient.Holly Kim

## 2011-03-05 ENCOUNTER — Ambulatory Visit (INDEPENDENT_AMBULATORY_CARE_PROVIDER_SITE_OTHER): Payer: Medicare Other | Admitting: Family Medicine

## 2011-03-05 ENCOUNTER — Encounter: Payer: Self-pay | Admitting: Family Medicine

## 2011-03-05 DIAGNOSIS — M549 Dorsalgia, unspecified: Secondary | ICD-10-CM

## 2011-03-05 DIAGNOSIS — Z79899 Other long term (current) drug therapy: Secondary | ICD-10-CM

## 2011-03-05 DIAGNOSIS — S8010XA Contusion of unspecified lower leg, initial encounter: Secondary | ICD-10-CM

## 2011-03-05 DIAGNOSIS — E1165 Type 2 diabetes mellitus with hyperglycemia: Secondary | ICD-10-CM

## 2011-03-05 NOTE — Patient Instructions (Signed)
It was great to see you today. If your foot gets any worse, call us. Let us know if you have problems with your medicines.  We will look into the pain clinic appointment.

## 2011-03-06 NOTE — Assessment & Plan Note (Signed)
Refer to pain clinic.  Pt interested in referral.

## 2011-03-06 NOTE — Assessment & Plan Note (Signed)
Glucose fluctuates.  Pt continues to meet with Dr. [name].  Continue current regimen

## 2011-03-06 NOTE — Assessment & Plan Note (Signed)
R great toe especially concerning.  Will dress L shin in vaseline gauze.  Pt has wound care daily with home health aide.  Will follow.  Warning signs reviewed.

## 2011-03-06 NOTE — Assessment & Plan Note (Signed)
After pt left, I reviewed the controlled substance database and discovered that she filled prescriptions on 02/20/11 from Dr. Kerry Dory for Lorazepam 0.5 mg #30, Percocet 10/325 #60, and MS Contin 15 mg #60.  This directly contradicts what patient told me in clinic, that she had torn up the prescriptions and thrown them away.  I am afraid that this confirms earlier concerns that Holly Kim is either taking more controlled substances than prescribed, or she or someone else is diverting her medicines.  I will discuss with her at her next visit.  Pain clinic appt is pending.  I do not plan to prescribe any further controlled substances for [name].

## 2011-03-06 NOTE — Progress Notes (Signed)
  Subjective:    Patient ID: Holly Kim, female    DOB: 16-May-1941, 70 y.o.   MRN: [id number]  HPI 70 yo female here for follow up.    Diabetes- glucose ranges 70s-200s.  Highest 283.  Feels ok about her glucose.  Reports taking meds.  Has home health come out to fill syringes.  [name] is cooking for her.  Housing - has moved into new house.  [name] fixed up her room, has ramp to door, but otherwise it is "not good".  She states [name] has done a lot of work but there are holes in the walls and the house is in bad shape.  She does feel safe there and does not want to move.  Requesting more prescriptions for pain meds.  She was reminded that [name] would still have the scripts that I wrote before her hospitalization.  I asked about the prescriptions that were written from the  nursing home, and she said, "Oh, I threw those away.".  I advised her that that was not the way to dispose of the scripts, that I had offered to tear them up in clinic.  I mentioned the controlled substance database that could let us know if the prescriptions were filled and asked, "Is there any way someone could have filled those prescriptions?" and she stated, "No, I don't think so.  I tore them up before I threw them away."  Still reports pain in her back and abdomen.  Currently on antibiotics for UTI - taking without difficulty.  Improved urinary symptoms.   Still with nausea.  Still with abdominal pain.  Takes phenergan for relief.    Hit her leg on the edge of her bed.  Injured R great toe (no feeling in that toe) and L shin).  Treating with neosporin and dry gauze.    Breathing is stable.  [name] turns her O2 down to 2 L sometimes to try to help her, but she reports that she needs 3L.    Review of Systems See HPI    Objective:   Physical Exam  Nursing note and vitals reviewed. Constitutional:       Morbidly obese, in wheelchair  Cardiovascular: Normal rate, regular rhythm and normal heart sounds.  Exam  reveals no gallop and no friction rub.   No murmur heard.      Distant sounds  Pulmonary/Chest: Effort normal and breath sounds normal. No respiratory distress. She has no wheezes. She has no rales. She exhibits no tenderness.       Inspiratory effort limited by body habitus  Abdominal: Soft. She exhibits no distension and no mass. There is tenderness. There is no rebound and no guarding.       Exam severely limited by body habitus. +Tender to light palpation.  Musculoskeletal:       R great toe with crusted blood under nail and on medial side of toe.  +mild surrounding erythema.   L ant. Lower leg with 3 cm abrasion and 2 more 1 cm abrasions.  No surrounding erythema.    Skin: Skin is warm and dry.          Assessment & Plan:

## 2011-03-07 ENCOUNTER — Encounter: Payer: Self-pay | Admitting: Family Medicine

## 2011-03-07 ENCOUNTER — Other Ambulatory Visit: Payer: Self-pay | Admitting: Family Medicine

## 2011-03-07 ENCOUNTER — Telehealth: Payer: Self-pay | Admitting: Family Medicine

## 2011-03-07 DIAGNOSIS — Z7409 Other reduced mobility: Secondary | ICD-10-CM | POA: Insufficient documentation

## 2011-03-07 DIAGNOSIS — H543 Unqualified visual loss, both eyes: Secondary | ICD-10-CM | POA: Insufficient documentation

## 2011-03-07 NOTE — Telephone Encounter (Signed)
Refill request

## 2011-03-07 NOTE — Telephone Encounter (Signed)
I called Ms. Gade and let her know: 1) Forms will be available at front desk for her [facility] request and her [employer] list. 2) I will no longer prescribe controlled substances for her.  We discussed the rx that were filled on 02/20/11. I suggested the [facility] for help with anxiety, and told her the pain clinic appt was pending.  In the meantime, she could try Tylenol for pain, and she still should have narcotic pain medicine in her possession.  I plan to see her in early June, and emphasized that I would be happy to continue to be her doctor, but that I will no longer prescribe pain meds.

## 2011-03-12 ENCOUNTER — Other Ambulatory Visit: Payer: Self-pay | Admitting: Family Medicine

## 2011-03-13 ENCOUNTER — Other Ambulatory Visit (INDEPENDENT_AMBULATORY_CARE_PROVIDER_SITE_OTHER): Payer: Medicare Other | Admitting: Family Medicine

## 2011-03-13 DIAGNOSIS — E119 Type 2 diabetes mellitus without complications: Secondary | ICD-10-CM

## 2011-03-13 MED ORDER — "INSULIN SYRINGE-NEEDLE U-100 31G X 5/16"" 1 ML MISC": Status: DC

## 2011-03-14 ENCOUNTER — Other Ambulatory Visit (INDEPENDENT_AMBULATORY_CARE_PROVIDER_SITE_OTHER): Payer: Medicare Other | Admitting: Family Medicine

## 2011-03-14 DIAGNOSIS — I10 Essential (primary) hypertension: Secondary | ICD-10-CM

## 2011-03-14 MED ORDER — LISINOPRIL 5 MG PO TABS: 5.0000 mg | ORAL_TABLET | Freq: Every day | ORAL | Status: DC

## 2011-03-19 ENCOUNTER — Ambulatory Visit: Payer: Medicare Other | Admitting: Family Medicine

## 2011-03-27 ENCOUNTER — Telehealth: Payer: Self-pay | Admitting: Family Medicine

## 2011-03-27 NOTE — Telephone Encounter (Signed)
Holly Kim with [facility] requesting verbal order from primary or any other physician to authorize patient to have home health care for bathing.  Please call her asap

## 2011-03-28 ENCOUNTER — Other Ambulatory Visit: Payer: Self-pay | Admitting: Family Medicine

## 2011-04-01 ENCOUNTER — Telehealth: Payer: Self-pay | Admitting: Family Medicine

## 2011-04-01 NOTE — Telephone Encounter (Signed)
Call pt back regarding referral to Pain Clinic

## 2011-04-01 NOTE — Telephone Encounter (Signed)
Pt's information has been faxed to pain clinic 5.29.2012 by Sharlot Gowda. Told her that we don't have any control over the appt time and dates for their clinic. She stated that she called the clinic and they told her that they have not received the referral for this.  Jimmy Footman will check past faxes and refax pt information.Huntley Dec found the orginal fax and faxed it back to [facility] [phone], Viann Shove

## 2011-04-01 NOTE — Telephone Encounter (Signed)
Done

## 2011-04-08 ENCOUNTER — Encounter: Payer: Self-pay | Admitting: Family Medicine

## 2011-04-08 ENCOUNTER — Ambulatory Visit (INDEPENDENT_AMBULATORY_CARE_PROVIDER_SITE_OTHER): Payer: Medicare Other | Admitting: Family Medicine

## 2011-04-08 DIAGNOSIS — Z79899 Other long term (current) drug therapy: Secondary | ICD-10-CM

## 2011-04-08 DIAGNOSIS — D649 Anemia, unspecified: Secondary | ICD-10-CM

## 2011-04-08 DIAGNOSIS — E119 Type 2 diabetes mellitus without complications: Secondary | ICD-10-CM

## 2011-04-08 DIAGNOSIS — M549 Dorsalgia, unspecified: Secondary | ICD-10-CM

## 2011-04-08 DIAGNOSIS — F411 Generalized anxiety disorder: Secondary | ICD-10-CM

## 2011-04-08 LAB — BASIC METABOLIC PANEL
BUN: 26 mg/dL — ABNORMAL HIGH (ref 6–23)
Calcium: 9.1 mg/dL (ref 8.4–10.5)
Creat: 1.1 mg/dL (ref 0.50–1.10)

## 2011-04-08 MED ORDER — TRAMADOL HCL 50 MG PO TABS: 50.0000 mg | ORAL_TABLET | Freq: Four times a day (QID) | ORAL | Status: DC | PRN

## 2011-04-08 NOTE — Patient Instructions (Addendum)
You have an appt with the pain center on 05/27/11 at 9:30 with Dr. Wynn Banker.  [facility] [address], [address]  They will evaluate you, but not prescribe any narcotics at that visit.  We will check some blood work today.    For the anxiety, you can call the [facility]: [phone].  Please call us if you need [name].  We will give you the cards to do the stool studies to look for blood today.

## 2011-04-09 LAB — CBC
HCT: 31.5 % — ABNORMAL LOW (ref 36.0–46.0)
MCH: 24.7 pg — ABNORMAL LOW (ref 26.0–34.0)
MCHC: 29.2 g/dL — ABNORMAL LOW (ref 30.0–36.0)
MCV: 84.5 fL (ref 78.0–100.0)
Platelets: 215 10*3/uL (ref 150–400)
RDW: 14.3 % (ref 11.5–15.5)

## 2011-04-09 NOTE — Assessment & Plan Note (Signed)
Pt has been out of lorazepam for "a while".  She does not complain of withdrawal symptoms.  She was given the number for [facility] and advised that they have psychiatrists on staff who could evaluate her and prescribe meds if needed.  She accepted the number but said that she does not need a therapist.

## 2011-04-09 NOTE — Assessment & Plan Note (Addendum)
Pain today consistent with muscle spasm, but I am hesitant to prescribe additional sedating meds in this fragile patient.   Pt with meds refilled that she denies.  I also verified with [name] - pt picked up prescriptions for 60 percocets on 5/24 and 60 MS Contins on 03/18/11.  This does not fit with pt's report that she only has 4 MS Contins left.   After pt left, I reviewed with Dr. Raymondo Band, and we discussed tapering her meds.  I will write a prescription for MS Contin 10 mg for her to take for 60 days.  I have given her a prescription for Tramadol to help with break-through pain.   I attempted to call Ms. Ogden, but one number is busy, and the VM is not set up for the other number.   I will send a letter with this plan to Ms. Lowery.

## 2011-04-09 NOTE — Assessment & Plan Note (Signed)
Pt still appears pale.  Will check CBC today to follow.  Her anemia is concerning for underlying malignancy.  She has repeatedly refused screenings such as paps, colonoscopy, and mammograms. In addition, she has been evaluated by GI who felt the risks of colonoscopy would outweigh the benefits.  She agrees today to do FOB cards.  If these are positive, we will need to discuss further work-up with pt again.

## 2011-04-09 NOTE — Progress Notes (Signed)
  Subjective:    Patient ID: Holly Kim, female    DOB: 11/17/1940, 70 y.o.   MRN: [id number]  HPI 70 yo female with chronic pain and recent inconsistencies in obtaining controlled substances.  She was told on the phone that we would not be able to prescribe controlled substances again.  She has a pain clinic appt on 8/13.  Currently, she denies constipation.  She states she has been out of lorazepam, that she is out of percocets, and that she has 4 MS Contins left.    She reports back pain and abdominal pain. Holly Kim requests pain meds to last until her appt at the pain clinic.  We discussed that these meds can be dangerous, and that since we know prescriptions were filled that she thought had been discarded, then we do not have a way of ensuring that the scripts will be secure and that someone must be filling her meds without her knowledge.  She repeatedly asked for more narcotics, and told me that she hoped someday I had pain like this.  Later she said she hoped my husband and children never had pain like this.    Glucose running in 200s with some 140s.     Review of SystemsSee HPI.       Objective:   Physical Exam  Nursing note and vitals reviewed. Constitutional:       Obese, in wheelchair.  Intermittently moaning.  No diaphoresis.  Cardiovascular: Normal rate and regular rhythm.  Exam reveals no gallop and no friction rub.   No murmur heard.      Distant heart sounds.  Pulmonary/Chest: Effort normal and breath sounds normal. No respiratory distress. She has no wheezes. She has no rales.       Air movement limited but consistent with earlier exams.    Abdominal: Soft. She exhibits no distension and no mass. There is tenderness.       Exam severely limited by body habitus.  Very TTP supreficially R side.  No mass or HSM appreciated, but exam is limited.  Musculoskeletal:       Back TTP along trapezius on L.    Skin: Skin is warm and dry. She is not diaphoretic. There is  pallor.          Assessment & Plan:

## 2011-04-10 ENCOUNTER — Other Ambulatory Visit: Payer: Self-pay | Admitting: Family Medicine

## 2011-04-10 NOTE — Telephone Encounter (Signed)
Refill request

## 2011-04-11 ENCOUNTER — Other Ambulatory Visit: Payer: Self-pay | Admitting: Family Medicine

## 2011-04-11 ENCOUNTER — Encounter: Payer: Self-pay | Admitting: Family Medicine

## 2011-04-11 NOTE — Telephone Encounter (Signed)
Rx hand written for MS Contin 10 mg, 1 po BID #60.  Will leave up front for pt.  See previous letter.

## 2011-04-22 ENCOUNTER — Encounter: Payer: Self-pay | Admitting: Family Medicine

## 2011-04-22 ENCOUNTER — Ambulatory Visit (INDEPENDENT_AMBULATORY_CARE_PROVIDER_SITE_OTHER): Payer: Medicare Other | Admitting: Family Medicine

## 2011-04-22 DIAGNOSIS — E118 Type 2 diabetes mellitus with unspecified complications: Secondary | ICD-10-CM

## 2011-04-22 DIAGNOSIS — J449 Chronic obstructive pulmonary disease, unspecified: Secondary | ICD-10-CM

## 2011-04-22 DIAGNOSIS — Z79899 Other long term (current) drug therapy: Secondary | ICD-10-CM

## 2011-04-22 NOTE — Assessment & Plan Note (Signed)
Good control by HgbA1C.  Fair control by CBGs.  Will not alter insulin at this time.

## 2011-04-22 NOTE — Assessment & Plan Note (Signed)
Per pt, she has received her MS Contin.  We again discussed that these meds are dangerous. She requested lorazepam, but I again reminded her that if she needs that medicine, she will need to have a psychiatrist follow her for it. Pain clinic appt is pending.  Pt reports she is still in pain.  She has been given a prescription for tramadol which she may use as needed for pain.

## 2011-04-22 NOTE — Progress Notes (Signed)
  Subjective:    Patient ID: Holly Kim, female    DOB: 09/26/1941, 70 y.o.   MRN: [id number]  HPI  70 yo female here for follow up.  She feels good about sugars - no problems with meds (nursing fills syringes for her) and her sugars range from 100s -200s with one 300 (when she did not use her insulin).  Did pick up her pain meds as I advised her in a letter.  She reports she is still hurting.   She is mad at me because I did not refill her lorazepam.  She has been to the [facility] and knows how to contact them, but states "I don't need a psychiatrist" and refuses to go to one.    Her grandchildren have been in touch with her recently.  Her daughter Zella Ball won't talk with her, but she has heard through grandchildren that her daughter has cancer and will begin chemotherapy.  However, Ms. Grimme is skeptical because Zella Ball has told "tall tales" in the past.  Her breathing is still "not good"  She declines colonoscopy to further evaluate her abdominal pain.  Ms. Ferrera would be a risky candidate for the sedation, given her multiple medical problems.      Review of Systems     Objective:   Physical Exam  Constitutional:       Obese.  In wheelchair, on O2 by [place]  Cardiovascular: Normal rate, regular rhythm and normal heart sounds.  Exam reveals no gallop and no friction rub.   No murmur heard.      Distant heart sounds.  Pulmonary/Chest: Effort normal. No respiratory distress. She has no wheezes.       Decreased breath sounds throughout.  Rales at bases.    Abdominal: Soft. She exhibits no distension and no mass. There is no rebound and no guarding.       + BS.  Exam severely limited by body habitus, but no mass or HSM appreciated.  Very TTP R side, seems superficial as pain elicited with light touch.    Skin: Skin is warm and dry.          Assessment & Plan:

## 2011-04-22 NOTE — Assessment & Plan Note (Signed)
Pt with decreased breath sounds on exam.  Consistent with previous exams, but will repeat chest xray today (or as soon as pt can get to radiology dept).

## 2011-04-30 ENCOUNTER — Other Ambulatory Visit: Payer: Self-pay | Admitting: Family Medicine

## 2011-04-30 ENCOUNTER — Telehealth: Payer: Self-pay | Admitting: Family Medicine

## 2011-04-30 NOTE — Telephone Encounter (Signed)
Holly Kim has called because the prescription she was given the other day for Morphine was for 10 mg and [facility] said they don't make a dosage that small.  Has been getting 15mg .  Wants to know what she should do or does she need a new prescription.

## 2011-04-30 NOTE — Telephone Encounter (Signed)
Refill request

## 2011-05-01 NOTE — Telephone Encounter (Signed)
I will write a new prescription for her to pick up tomorrow afternoon.  She will either need to bring the original prescription back, or let us know which [facility] to call so we can ensure the original prescription was destroyed before I can give any more prescriptions.

## 2011-05-01 NOTE — Telephone Encounter (Signed)
Holly Kim has been try ing to get this rx straightened out regarding her morphine.  She cannot use the 10mg  because [facility] doesn't have that.  Requesting that new rx been written for her 15mg .   Call her when ready and she will send her son to pick up.

## 2011-05-02 ENCOUNTER — Other Ambulatory Visit: Payer: Self-pay | Admitting: Family Medicine

## 2011-05-02 MED ORDER — MORPHINE SULFATE 10 MG/5ML PO SOLN: ORAL | Status: DC

## 2011-05-02 MED ORDER — OXYCODONE HCL 5 MG PO TABS: ORAL_TABLET | ORAL | Status: DC

## 2011-05-02 MED ORDER — CLOPIDOGREL BISULFATE 75 MG PO TABS: 75.0000 mg | ORAL_TABLET | Freq: Every day | ORAL | Status: DC

## 2011-05-02 NOTE — Progress Notes (Signed)
No local pharmacy carries the morphine in the strength I prescribed.  Will add oxycodone to her regimen with instructions to take sparingly. Will increase the Tramadol to 100 mg every 6 hours as needed for pain.

## 2011-05-02 NOTE — Progress Notes (Signed)
Spoke with pt by phone, and confirmed that [facility] Kim does not carry the 10 mg dose.  Will write for short acting liquid formulation.  Holly Kim again asked to go back to her original meds, but we again discussed that she had prescription filled that she thought had been destroyed.  Her son came while we were on the phone, and returned the Holly Contin 10 mg rx.  A new rx was given to him for solution, 10mg /5ML, she will need to switch to short acting morphine since there is no long acting available to her.  She also reports her sugars have be fluctuating.  If she takes both shots of insulin at night, her am sugars are 50s or 40s, and she feels shaky.  If she does not take evening insulin, her glucose is 200s.  During the day, her glucose is in 50s and 60s if she takes her entire Novolog shots (18 units with meals). Her syringes are prefilled, so she cannot adjust the dose.    We plan to have her hold the evening novolog, to try to give 1/2 doses of daytime novolog.  She will  followup with me MOnday.  Pt aware of symptoms of hypoglycemia an management.

## 2011-05-02 NOTE — Telephone Encounter (Signed)
Refill request

## 2011-05-06 ENCOUNTER — Ambulatory Visit: Payer: Medicare Other | Admitting: Family Medicine

## 2011-05-09 LAB — HEMOCCULT GUIAC POC 1CARD (OFFICE): Card #2 Fecal Occult Blod, POC: NEGATIVE

## 2011-05-09 NOTE — Progress Notes (Signed)
Addended by: [place], Lorry Anastasi on: 05/09/2011 10:59 AM   Modules accepted: Orders

## 2011-05-15 ENCOUNTER — Other Ambulatory Visit: Payer: Self-pay | Admitting: Family Medicine

## 2011-05-16 ENCOUNTER — Encounter: Payer: Self-pay | Admitting: Family Medicine

## 2011-05-16 ENCOUNTER — Ambulatory Visit (INDEPENDENT_AMBULATORY_CARE_PROVIDER_SITE_OTHER): Payer: Medicare Other | Admitting: Family Medicine

## 2011-05-16 VITALS — BP 127/65 | HR 74

## 2011-05-16 DIAGNOSIS — R69 Illness, unspecified: Secondary | ICD-10-CM

## 2011-05-16 DIAGNOSIS — M549 Dorsalgia, unspecified: Secondary | ICD-10-CM

## 2011-05-16 DIAGNOSIS — Z7409 Other reduced mobility: Secondary | ICD-10-CM

## 2011-05-16 DIAGNOSIS — E118 Type 2 diabetes mellitus with unspecified complications: Secondary | ICD-10-CM

## 2011-05-16 MED ORDER — TRAMADOL HCL 50 MG PO TABS: 50.0000 mg | ORAL_TABLET | Freq: Four times a day (QID) | ORAL | Status: DC | PRN

## 2011-05-16 NOTE — Telephone Encounter (Signed)
Refill request

## 2011-05-16 NOTE — Patient Instructions (Signed)
We will refill the tramadol  You may take up to 8 tablets a day. You can also try tylenol.  It may help with the pain you are having.  You used to get tylenol with the percocets, and it probably really helped.

## 2011-05-16 NOTE — Assessment & Plan Note (Signed)
Pt would like Tramadol refill.  There is abuse potential with this med, but pt does need something for pain control.  Has pain clinic appt on 05/27/11.  Also suggested Tylenol.

## 2011-05-16 NOTE — Telephone Encounter (Signed)
Pt has appt with me today.  I will assess her pain at that appt.

## 2011-05-16 NOTE — Assessment & Plan Note (Signed)
Good control per A1C.  Reported episodes of hypoglycemia, but pt feels she can manage these.  She does not want to change her regimen.  She adjusts her insulin herself, and feels comfortable doing this.

## 2011-05-16 NOTE — Progress Notes (Signed)
  Subjective:    Patient ID: Holly Kim, female    DOB: [birth year], 70 y.o.   MRN: [id number]  HPI  19 you female with multiple medical problems presents with c/o pain.  She states her stomach hurts, her R foot hurts where they put a bar in her leg, her R ear hurts.  She took all of the percocets (twice a day) and is now out.  She says the tramadol helps some.  She would like a refill.  SHe also reports nausea and diarrhea for one day.   She c/o breathing problems, even on O2. Ms. Favero states "I'm sorry I lied to you.  I did not realize [name] had filled those prescriptions and was giving them to me."  Pt has her glucose log.  Many readings in 200s. Also reports some episodes of hypoglycemia with a 47, and a 69. If this happens, she feels symptomatic and drinks chocolate milk.  Happened once at night.  Takes some of her lantus, often not all 65 units.  Still take 18 units novolog with meals.       Review of SystemsSee HPI     Objective:   Physical Exam  Constitutional:       Obese.  IN motorize scooter.  HENT:  Head: Normocephalic and atraumatic.  Right Ear: External ear normal.  Left Ear: External ear normal.  Eyes: Conjunctivae are normal. Right eye exhibits no discharge. Left eye exhibits no discharge. No scleral icterus.  Cardiovascular: Normal rate and regular rhythm.  Exam reveals no gallop and no friction rub.   No murmur heard.      Distant sounds  Pulmonary/Chest: No respiratory distress. She has no wheezes. She has no rales.       Decreased effort and air movement  Abdominal: Soft. Bowel sounds are normal. She exhibits no distension.       Very limited by body habitus.  No mass or HSM appreciated.  Musculoskeletal:       Pt without edema BLE.  Gasped in pain while I was examing the RLE, even before I touched her.    Skin: Skin is warm and dry. No rash noted. No erythema.  Psychiatric:       At times, pt moans in apparent discomfotr and has shallow  breathing , but then becomes animated and speaks easily in full sentences.  Does not appear depressed or anxious, but does report she is mad at me because "you are the reason I am so sick".  Does not appear to respond to internal stimuli.  Normal thought content and process without FOI or LOA.  Denies SI          Assessment & Plan:

## 2011-05-16 NOTE — Assessment & Plan Note (Signed)
Filled out paperwork for [employer] services.

## 2011-05-17 ENCOUNTER — Emergency Department (HOSPITAL_COMMUNITY): Payer: Medicare Other

## 2011-05-17 ENCOUNTER — Emergency Department (HOSPITAL_COMMUNITY)
Admission: EM | Admit: 2011-05-17 | Discharge: 2011-05-17 | Disposition: A | Discharge status: Home / Self Care | Payer: Medicare Other | Attending: Emergency Medicine | Admitting: Emergency Medicine

## 2011-05-17 DIAGNOSIS — R0602 Shortness of breath: Secondary | ICD-10-CM | POA: Insufficient documentation

## 2011-05-17 DIAGNOSIS — I252 Old myocardial infarction: Secondary | ICD-10-CM | POA: Insufficient documentation

## 2011-05-17 DIAGNOSIS — E119 Type 2 diabetes mellitus without complications: Secondary | ICD-10-CM | POA: Insufficient documentation

## 2011-05-17 DIAGNOSIS — G8929 Other chronic pain: Secondary | ICD-10-CM | POA: Insufficient documentation

## 2011-05-17 DIAGNOSIS — R109 Unspecified abdominal pain: Secondary | ICD-10-CM | POA: Insufficient documentation

## 2011-05-17 DIAGNOSIS — I509 Heart failure, unspecified: Secondary | ICD-10-CM | POA: Insufficient documentation

## 2011-05-17 DIAGNOSIS — J449 Chronic obstructive pulmonary disease, unspecified: Secondary | ICD-10-CM | POA: Insufficient documentation

## 2011-05-17 DIAGNOSIS — R059 Cough, unspecified: Secondary | ICD-10-CM | POA: Insufficient documentation

## 2011-05-17 DIAGNOSIS — R05 Cough: Secondary | ICD-10-CM | POA: Insufficient documentation

## 2011-05-17 DIAGNOSIS — J4489 Other specified chronic obstructive pulmonary disease: Secondary | ICD-10-CM | POA: Insufficient documentation

## 2011-05-17 DIAGNOSIS — I10 Essential (primary) hypertension: Secondary | ICD-10-CM | POA: Insufficient documentation

## 2011-05-22 ENCOUNTER — Other Ambulatory Visit: Payer: Self-pay | Admitting: Family Medicine

## 2011-05-22 NOTE — Telephone Encounter (Signed)
Refill request

## 2011-05-27 ENCOUNTER — Ambulatory Visit: Payer: Medicare Other | Admitting: Physical Medicine & Rehabilitation

## 2011-05-27 ENCOUNTER — Encounter: Payer: Medicare Other | Attending: Physical Medicine & Rehabilitation

## 2011-05-27 DIAGNOSIS — G609 Hereditary and idiopathic neuropathy, unspecified: Secondary | ICD-10-CM | POA: Insufficient documentation

## 2011-05-27 DIAGNOSIS — M542 Cervicalgia: Secondary | ICD-10-CM | POA: Insufficient documentation

## 2011-05-27 DIAGNOSIS — M961 Postlaminectomy syndrome, not elsewhere classified: Secondary | ICD-10-CM | POA: Insufficient documentation

## 2011-05-27 DIAGNOSIS — M545 Low back pain, unspecified: Secondary | ICD-10-CM | POA: Insufficient documentation

## 2011-05-27 DIAGNOSIS — M79609 Pain in unspecified limb: Secondary | ICD-10-CM | POA: Insufficient documentation

## 2011-05-27 DIAGNOSIS — M549 Dorsalgia, unspecified: Secondary | ICD-10-CM | POA: Insufficient documentation

## 2011-05-28 NOTE — Progress Notes (Signed)
REASON FOR VISIT:  Back pain, neck pain, and leg pain.  HISTORY:  The patient is a 70 year old female with complicated past medical history with chief complaints of back pain, leg pain, and neck pain.  She states she has had neck surgery in the past and in fact has a posterior laminectomy scar.  She could not tell me exactly when it was, I look through E-chart and no records were available for that operation. Dr. Nigel Bridgeman reportedly did that and she could not tell me exactly what date it was. I look through other E-chart records.  She is a poor historian.  She does note that she has had back pain for number years. I would do not see any records of any surgery or treatment specifically for that issue, do have some family practice notes and that is one of her diagnoses is chronic low back pain.  In regards to her imaging studies in the past, she has had L-spine films, some degeneration, facet joints L3-4, L4-5, L5-S1 were noted.  I looked at these images myself and have further noted that there was no significant disk space narrowing seen on lateral films.  No MRI imaging noted on her lumbar area in the past.  In regards to her neck, I would do not see any C- spine imaging studies in the past.  Also in her medical records, has had a fall right arm injury and a fall resulting in grade 3 open ankle fracture on the right, ORIF on April 25, 2003.  She has screw removal in 2004 for the same ORIF.  She has had some intermittent hospitalizations for variety of reasons including GI bleed, H. pylori positive, diabetic gastroparesis.  She has uncontrolled diabetes.  She has diabetic neuropathy and retinopathy noted in her past history.  She states that she has had six a mini strokes as well, history of DVT and PE.  She reportedly had a skull fracture at one point, no formal diagnosis of brain injury.  She does have a psychiatric diagnosis of borderline personality disorder, depressive disorder, and  anxiety, but no longer sees a psychiatrist.  She has had poor nursing home stays per her report, but now resides with her son.  SOCIAL HISTORY:  Lives with son, widowed.  Son is alcoholic.  She denies any alcohol use or drug use.  She states that is spring she was taken off narcotic analgesics by her new residence at [facility]. She states that she went through withdrawals.  She has COPD and has been on chronic O2.  Denies history of smoking.  ALLERGIES:  AMOXICILLIN, CODEINE, CORTISONE, HALDOL, AND NORVASC.  PAST PAIN MEDICINES:  MS Contin 15 b.i.d. and Percocet 100/325 t.i.d.  PAST MEDICAL HISTORY:  She is status post cardiac stenting in 2011.  Non- STEMI in 2011.  Left lower lobe pneumonia in 2011.  HEALTH AND HISTORY FORM:  Please refer to 8-10/10 pain.  Sleep is poor. Not climbing steps, not driving, uses a wheelchair, needs help with dressing, bathing, meal prep, household duties, shopping.  REVIEW OF SYSTEMS:  Tremor, depression, anxiety.  No suicidal thoughts, nausea, constipation, abdominal pain, or limb swelling.  She states that she has gallbladder problems, but cannot undergo surgery for this.  FAMILY HISTORY:  Heart disease, diabetes, and hypertension.  PSYCHIATRIC PROBLEMS:  Disability.  PHYSICAL EXAMINATION:  VITAL SIGNS:  Blood pressure 109/54, pulse 58, and O2 sat 97% on room air. GENERAL:  Obese, elderly female in no acute distress. HEENT:  Eyes, anicteric, noninjected.  External ENT shows poor dentition. NECK:  Posterior scar.  No anterior scar.  Some tenderness via neck palpation.  She has tenderness even with light touch along the lumbar spine.  No evidence of erythema.  No skin rashes. EXTREMITIES:  Lower extremities show stasis dermatitis bilateral lower extremities.  She has decreased sensation from midcalf down in bilateral feet to light touch and pinprick.  Her range of motion is reduced right ankle to about 25% of normal, left ankle 50%  of normal.  Lower extremity strength is 4- bilateral quads, 3- bilateral ankles.  Upper extremity strength is 4 minus 5 bilaterally, deltoid, biceps, triceps grip.  Upper extremity range of motion is within functional limits.  Lower extremity range of motion reduced to about half range of the hips and knees. Accompanied by pain with attempted testing.  IMPRESSION: 1. Chronic deconditioning related to multiple medical problems such as     chronic obstructive pulmonary disease, coronary artery disease, and     chronic cholecystitis. 2. Pain syndrome.  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  We will get some x-rays just to see what level she had     surgery on and what kind of instrumentation was placed and if there     is any adjacent level degeneration. 3. Low back pain.  Only imaging study available x-rays in 2006 showing     facet arthropathy, disk space look good.  May be more of a lumbar     facet syndrome. 4. Peripheral neuropathy pain.  We will start on gabapentin.  We discussed pros and cons of narcotic analgesics in her case.  We will first need to better identify etiologies of her pain, although primarily I would say this is a lumbar facet syndrome plus cervical post laminectomy plus peripheral neuropathy.  We will look at possibility of using injections, however, I would need to come off Coumadin or Plavix.  She is on Plavix, currently with stent placed about a year ago.  I really do not think physical therapy is indicated.  At this point, she can still transfer independently, she has not ambulated about 15 years for reasons I do not quite understand, but certainly given that time frame be difficult to get her back on her feet again.  She certainly does not appear to be motivated for therapy neither case.  Discussed the patient and agrees plan.  We will check urine drug screen.  We may judicially put her on some long-acting medication.  She was on  a relatively modest doses before but was asking for escalation.  We will review imaging studies and talk with the patient again about 3 weeks.     Erick Colace, M.D. Electronically Signed    AEK/MedQ D:  05/27/2011 10:56:44  T:  05/27/2011 14:57:58  Job #:  [id number]  cc:   SR [place] at [facility]

## 2011-06-03 ENCOUNTER — Encounter: Payer: Self-pay | Admitting: Family Medicine

## 2011-06-03 ENCOUNTER — Ambulatory Visit (INDEPENDENT_AMBULATORY_CARE_PROVIDER_SITE_OTHER): Payer: Medicare Other | Admitting: Family Medicine

## 2011-06-03 DIAGNOSIS — M549 Dorsalgia, unspecified: Secondary | ICD-10-CM

## 2011-06-03 DIAGNOSIS — I519 Heart disease, unspecified: Secondary | ICD-10-CM

## 2011-06-03 DIAGNOSIS — T148XXA Other injury of unspecified body region, initial encounter: Secondary | ICD-10-CM

## 2011-06-03 DIAGNOSIS — D649 Anemia, unspecified: Secondary | ICD-10-CM

## 2011-06-03 DIAGNOSIS — K5900 Constipation, unspecified: Secondary | ICD-10-CM

## 2011-06-03 DIAGNOSIS — I5189 Other ill-defined heart diseases: Secondary | ICD-10-CM

## 2011-06-03 DIAGNOSIS — E1165 Type 2 diabetes mellitus with hyperglycemia: Secondary | ICD-10-CM

## 2011-06-03 DIAGNOSIS — S8010XA Contusion of unspecified lower leg, initial encounter: Secondary | ICD-10-CM

## 2011-06-03 DIAGNOSIS — R1011 Right upper quadrant pain: Secondary | ICD-10-CM

## 2011-06-03 DIAGNOSIS — E118 Type 2 diabetes mellitus with unspecified complications: Secondary | ICD-10-CM

## 2011-06-03 DIAGNOSIS — R233 Spontaneous ecchymoses: Secondary | ICD-10-CM

## 2011-06-03 DIAGNOSIS — I2510 Atherosclerotic heart disease of native coronary artery without angina pectoris: Secondary | ICD-10-CM

## 2011-06-03 DIAGNOSIS — R1031 Right lower quadrant pain: Secondary | ICD-10-CM

## 2011-06-03 LAB — COMPREHENSIVE METABOLIC PANEL
ALT: 30 U/L (ref 0–35)
AST: 23 U/L (ref 0–37)
Albumin: 3.5 g/dL (ref 3.5–5.2)
BUN: 49 mg/dL — ABNORMAL HIGH (ref 6–23)
CO2: 35 mEq/L — ABNORMAL HIGH (ref 19–32)
Calcium: 9.3 mg/dL (ref 8.4–10.5)
Chloride: 94 mEq/L — ABNORMAL LOW (ref 96–112)
Creat: 1.27 mg/dL — ABNORMAL HIGH (ref 0.50–1.10)
Potassium: 4.4 mEq/L (ref 3.5–5.3)

## 2011-06-03 LAB — CBC
HCT: 30.4 % — ABNORMAL LOW (ref 36.0–46.0)
Hemoglobin: 9.7 g/dL — ABNORMAL LOW (ref 12.0–15.0)
MCHC: 31.9 g/dL (ref 30.0–36.0)
RDW: 14.7 % (ref 11.5–15.5)
WBC: 7.9 10*3/uL (ref 4.0–10.5)

## 2011-06-03 MED ORDER — TORSEMIDE 20 MG PO TABS: 20.0000 mg | ORAL_TABLET | Freq: Every day | ORAL | Status: DC

## 2011-06-03 MED ORDER — PROMETHAZINE HCL 25 MG PO TABS: 25.0000 mg | ORAL_TABLET | Freq: Four times a day (QID) | ORAL | Status: DC | PRN

## 2011-06-03 NOTE — Patient Instructions (Signed)
Decrease your torsemide (Demadex) to one pill a day.  This should help with the dry mouth. We will give you enough Tramadol to get you to the next pain clinic appt. Further pain medicine prescriptions will need to be from the pain clinic. Keep the neosporin on your toe.  If the spot gets bigger, or you get redness around it, or you have fevers or chills, then come see [name] or go to the ER.

## 2011-06-06 ENCOUNTER — Other Ambulatory Visit: Payer: Self-pay | Admitting: Physical Medicine & Rehabilitation

## 2011-06-06 ENCOUNTER — Other Ambulatory Visit: Payer: Self-pay | Admitting: Family Medicine

## 2011-06-06 ENCOUNTER — Ambulatory Visit (HOSPITAL_COMMUNITY)
Admission: RE | Admit: 2011-06-06 | Discharge: 2011-06-06 | Disposition: A | Discharge status: Home / Self Care | Payer: Medicare Other | Source: Ambulatory Visit | Attending: Physical Medicine & Rehabilitation | Admitting: Physical Medicine & Rehabilitation

## 2011-06-06 DIAGNOSIS — D7389 Other diseases of spleen: Secondary | ICD-10-CM | POA: Insufficient documentation

## 2011-06-06 DIAGNOSIS — R52 Pain, unspecified: Secondary | ICD-10-CM

## 2011-06-06 DIAGNOSIS — M542 Cervicalgia: Secondary | ICD-10-CM | POA: Insufficient documentation

## 2011-06-06 DIAGNOSIS — G9589 Other specified diseases of spinal cord: Secondary | ICD-10-CM | POA: Insufficient documentation

## 2011-06-06 DIAGNOSIS — M47812 Spondylosis without myelopathy or radiculopathy, cervical region: Secondary | ICD-10-CM | POA: Insufficient documentation

## 2011-06-06 DIAGNOSIS — M549 Dorsalgia, unspecified: Secondary | ICD-10-CM | POA: Insufficient documentation

## 2011-06-06 NOTE — Progress Notes (Signed)
  Subjective:    Patient ID: Holly Kim, female    DOB: [birth year], 70 y.o.   MRN: [id number]  HPI 70 yo with multiple medical issues here for follow up.  She was seen in the ED for pain 2 weeks ago.  Reports she received 20 percocets and still has 6 of them left.  She takes them sparingly, and feels much better when she takes them.  Still with abdominal and back pain.  + Constipation with straining and sometimes BRBPR when she strains. Uses colace and senna.  Refuses Miralax because it makes her sick.   + Nausea.  Denies Fever/chills. Seen at pain clinic and started on gabapentin.  This may help some.  Still taking tramadol.  Reports they offered refill, but she told them she had some, and now asks me for enough to get her to her next pain clinic appt.   Reports weight loss with no change in activity or eating habits.  Glucose ranged 65s-300s.  Does have some hypoglycemic symptoms, drinks chocolate milk and eats candy with improvement in sx, but then glucose elevated.  Still taking her insulin, but holds if glucose is < 100.  Reports decreased urine output. Also with "blood blister" on foot.  Denies known injury, but no feeling in feet.  No pain.   Concerned about "liver spots" on arms (mostly on L). Mother had these as well. Denies bleeding other than with straining to defecate but then remembered she has nose bleeds every other day.  Spots present for 1 month.  No oral bleeding.      Review of SystemsSee HPI     Objective:   Physical Exam  Constitutional:       Obese.  In motorized scooter.  Appears more discheveled than previous visits.    Eyes: Conjunctivae are normal. Right eye exhibits no discharge. Left eye exhibits no discharge. No scleral icterus.  Cardiovascular: Normal rate, regular rhythm and normal heart sounds.  Exam reveals no gallop and no friction rub.   No murmur heard.      Distal sounds  Pulmonary/Chest: Effort normal and breath sounds normal. No respiratory  distress. She has no wheezes. She has no rales.       Inspiration limited by body habitus.  Abdominal: Soft. Bowel sounds are normal. She exhibits no distension and no mass. There is tenderness. There is no rebound and no guarding.       Exam severely limited by body habitus. TTP RLQ to light palpation, but no pain with auscultation.  Musculoskeletal:       BLE 1+edema. Very TTP, even with light touch on shins.   R great toe with 0.5 cm scab without surrounding erythema, TTP, or warmth.  Skin: Skin is warm and dry. No pallor.       Several lesions on L forearm, few lesions on R forearm, ranging 0.3 cm to 1 cm.  Raised, purple lesions.    Psychiatric: She has a normal mood and affect. Her behavior is normal. Judgment and thought content normal.          Assessment & Plan:

## 2011-06-06 NOTE — Telephone Encounter (Signed)
Refill request

## 2011-06-07 ENCOUNTER — Telehealth: Payer: Self-pay | Admitting: Family Medicine

## 2011-06-07 ENCOUNTER — Encounter: Payer: Self-pay | Admitting: Family Medicine

## 2011-06-07 DIAGNOSIS — R233 Spontaneous ecchymoses: Secondary | ICD-10-CM | POA: Insufficient documentation

## 2011-06-07 DIAGNOSIS — I5189 Other ill-defined heart diseases: Secondary | ICD-10-CM | POA: Insufficient documentation

## 2011-06-07 NOTE — Assessment & Plan Note (Signed)
Unclear cause of abdominal pain.  Consider nerve entrapment syndrome.  Work up has been normal so far.

## 2011-06-07 NOTE — Assessment & Plan Note (Signed)
Lesions on arm c/w with ecchymoses.  Pt currently on clopdogrel.  No intervention needed at this time, but will continue to follow.

## 2011-06-07 NOTE — Assessment & Plan Note (Signed)
Pt with decreased BP, decreased UOP.  Likely overdiuresed at this point.  Will decrease torsemide and follow up 2 weeks.

## 2011-06-07 NOTE — Assessment & Plan Note (Deleted)
Pt with decreased BP, decreased UOP.  Likely overdiuresed at this point.  Will decrease torsemide and follow up 2 weeks. 

## 2011-06-07 NOTE — Assessment & Plan Note (Signed)
Pt still morbidly obese, which affects her general health and severely worsens her multiple medical problems.  However, she is having unintentional weight loss which is concerning.  She is not a candidate for colonoscopy per GI, and her hemoccult testing was negative.  She has refused mammograms and pap smears for many years.  She has undergone a work up for her abdominal pain by GI, and no concerning results we found.  Will continue to follow.

## 2011-06-07 NOTE — Assessment & Plan Note (Signed)
Pt declined Miralax - states it makes her really sick.  Continue current regimen for now.

## 2011-06-07 NOTE — Assessment & Plan Note (Signed)
Currently with small lesion on big toe.  No pain.  Advised supportive care with moist (bacitracin) guaze until healed.  Reviewed red flags.  Pt has supplies at home.

## 2011-06-07 NOTE — Telephone Encounter (Signed)
[employer] is going to fax a letter to Dr. [place].  It needs to be signed and faxed back as soon as possible.

## 2011-06-07 NOTE — Assessment & Plan Note (Signed)
Still with wide variations in glucose, likely due to erratic eating patterns and irreg use of insulin.  Also should consider medication misuse or even factitious disorder in pt with known discrepancy in narcotic prescription filling. HbgA1c at goal.  Pt feels comfortable adjusting her insulin and does not wish to make changes now.   Diabetic supplies from [facility] and [facility].  Forms for more supplies faxed.

## 2011-06-07 NOTE — Assessment & Plan Note (Signed)
Still with back pain.  Currently under care of pain clinic.  Reviewed the number of Tramadol pt should have at home.  She should have enough to get her to next pain clinic appt, and pt agrees that she probably has enough.  I asked her to let the pain clinic assume prescribing her pain meds.  She agrees to this plan.

## 2011-06-10 NOTE — Telephone Encounter (Signed)
I have not seen letter.  Called pt and she will call [employer] and ask them to re-fax.

## 2011-06-18 ENCOUNTER — Encounter: Payer: Medicare Other | Attending: Physical Medicine & Rehabilitation

## 2011-06-18 ENCOUNTER — Ambulatory Visit: Payer: Medicare Other | Admitting: Physical Medicine & Rehabilitation

## 2011-06-18 DIAGNOSIS — M961 Postlaminectomy syndrome, not elsewhere classified: Secondary | ICD-10-CM | POA: Insufficient documentation

## 2011-06-18 DIAGNOSIS — G609 Hereditary and idiopathic neuropathy, unspecified: Secondary | ICD-10-CM

## 2011-06-18 DIAGNOSIS — M545 Low back pain, unspecified: Secondary | ICD-10-CM | POA: Insufficient documentation

## 2011-06-18 DIAGNOSIS — M542 Cervicalgia: Secondary | ICD-10-CM | POA: Insufficient documentation

## 2011-06-18 DIAGNOSIS — M47817 Spondylosis without myelopathy or radiculopathy, lumbosacral region: Secondary | ICD-10-CM

## 2011-06-18 DIAGNOSIS — M79609 Pain in unspecified limb: Secondary | ICD-10-CM | POA: Insufficient documentation

## 2011-06-18 DIAGNOSIS — M549 Dorsalgia, unspecified: Secondary | ICD-10-CM | POA: Insufficient documentation

## 2011-06-18 NOTE — Assessment & Plan Note (Signed)
REASON FOR VISIT:  Neck pain, back pain.  Oswestry score 78%.  The patient on chronic O2, wheelchair-bound.  Pain level 7/10, described as sharp, stabbing, constant, tingling, aching, interfering with activity, completely sleep is poor.  Relief from meds is good when she has taken hydrocodone as well as oxycodone. She is off of these medicines now by the resident who takes care of her at [facility].  X-rays have been taken to look at lumbar spine.  There is facet spurs at L3-4, L4-5, L5-S1.  There is right ankle posttraumatic arthritis.  No pair hardware lucency to suggest loosening or infection.  Side plate screw fixation distal fibular.  Spurring at the talocalcaneal joint.  Cervical films show multilevel spondylosis, worse at C5-6.  Physical exam shows posterior laminectomy scar.  Other medical comorbidities diabetic neuropathy and gastroparesis.  SOCIAL HISTORY:  Lives with son, widowed, son is alcoholic.  ALLERGIES:  CODEINE, AMOXICILLIN, CORTISONE, HALDOL, NORVASC.  Prior pain meds include: 1. MS Contin 15 b.i.d. 2. Percocet 10/325 t.i.d.  PHYSICAL EXAMINATION:  MUSCULOSKELETAL:  Straight leg raising test is negative.  Lower extremity strength is reduced 4- bilateral quads, 3- bilateral ankles.  She has decreased sensation in mid calf down to bilateral feet to light touch, pinprick.  Right ankle range of motion is only about 25% of normal, left ankle is 50% of normal.  IMPRESSION: 1. Posttraumatic arthritis, right ankle with contracture. 2. Severe bilateral diabetic neuropathy. 3. Cervical post laminectomy syndrome. 4. Lumbar spondylosis.  PLAN:  Urine drug screen is appropriate.  We will restart on MS Contin 50 mg b.i.d.  We will not reinstitute oxycodone.  If the above dose is not particularly helpful, we will increase it to t.i.d.  We will see her back in 1 month and then can be transferred to [facility]. Discussed with patient agrees with plan.   Opioid consent form signed today.     Erick Colace, M.D. Electronically Signed    [facility]K/Med[facility] D:  06/18/2011 13:58:26  T:  06/18/2011 21:33:23  Job #:  [id number]  cc:   Peter M. [place], M.D. Fax: [phone]

## 2011-06-22 ENCOUNTER — Other Ambulatory Visit: Payer: Self-pay | Admitting: Family Medicine

## 2011-06-23 NOTE — Telephone Encounter (Signed)
PT has appt tomorrow - I would like to see her BP before filling her med.

## 2011-06-23 NOTE — Telephone Encounter (Signed)
Refill request

## 2011-06-24 ENCOUNTER — Ambulatory Visit (INDEPENDENT_AMBULATORY_CARE_PROVIDER_SITE_OTHER): Payer: Medicare Other | Admitting: Family Medicine

## 2011-06-24 ENCOUNTER — Encounter: Payer: Self-pay | Admitting: Family Medicine

## 2011-06-24 DIAGNOSIS — Z515 Encounter for palliative care: Secondary | ICD-10-CM | POA: Insufficient documentation

## 2011-06-24 DIAGNOSIS — E1165 Type 2 diabetes mellitus with hyperglycemia: Secondary | ICD-10-CM

## 2011-06-24 DIAGNOSIS — M549 Dorsalgia, unspecified: Secondary | ICD-10-CM

## 2011-06-24 DIAGNOSIS — E118 Type 2 diabetes mellitus with unspecified complications: Secondary | ICD-10-CM

## 2011-06-24 DIAGNOSIS — R1011 Right upper quadrant pain: Secondary | ICD-10-CM

## 2011-06-24 NOTE — Patient Instructions (Signed)
Your blood pressure is still a little low.  Please decrease your Lopressor (metoprolol) to 1/2 tablet twice a day.  This will be 12.5 mg twice a day. I am glad you are getting such good care at the pain clinic.  They are experts in managing pain.

## 2011-06-24 NOTE — Assessment & Plan Note (Signed)
Pt with multiple highs and lows.  Feels she can manage her lows without difficulty.  She sometimes holds her insulin if her sugar is <105 when she checks it.  Last Hgb A1C is fine.  Pt interested in meeting with Dr. Raymondo Band and with [facility] for further management.

## 2011-06-24 NOTE — Progress Notes (Signed)
  Subjective:    Patient ID: Holly Kim, female    DOB: November 20, 1940, 70 y.o.   MRN: [id number]  HPI  70 yo chronically ill female here with multiple medical issues. 1) Back and foot pain.  Much better.  Now on morphine again and feels much better with less pain.  Had lots of 2) Abdominal pain - still present.  Still with daily bowel movement with straining.  + nausea.  Refuses to take any liquid laxative (miralax or sorbitol).  Feels pain is due to gallstones.  Declines further work up. 3) Code status - Pt reports if her heart stops or she stops breathing, she would like for [name] to just "let her go".   However, Holly Kim (her son) does not want this.  Holly Kim states that he wants the  Full code, and she acknowledges that this could involve people pushing so hard on her chest that her sternum breaks.  She also understands that a code would be unlikely to bring her to her current level of functioning, and that it is likely she would not survive it.  However, she wants to be full code because this is what Holly Kim wants.   4)Diabetes - glucose ranges from 50 - 360.  Feels she is managing the lows ok.  She sometimes holds her insulin, especially if her glucose is <105.  She wants to see Holly Kim again.    Review of Systems  No shortness of breath.  + stable chest pain.     Objective:   Physical Exam  Constitutional:       Obese, in wheelchair.  Prominent hair on chin, disheveled appearing with spots on clothing.  + urine odor.  Sleepier than usual.  Cardiovascular: Normal rate and regular rhythm.  Exam reveals no gallop and no friction rub.   No murmur heard.      Distant sounds.  Pulmonary/Chest: No respiratory distress. She has no wheezes. She has no rales.       Decreased sounds throughout with limited air movement. Clear to percussion B.  Abdominal: Soft. Bowel sounds are normal.  Musculoskeletal:       Non tender.  L foot still with scab on great toe.  No surrounding erythema or  warmth.  Psychiatric: She has a normal mood and affect. Her behavior is normal. Judgment and thought content normal.          Assessment & Plan:

## 2011-06-24 NOTE — Assessment & Plan Note (Signed)
Pt with some pain, but much improved.  She declines any further workup.

## 2011-06-24 NOTE — Telephone Encounter (Signed)
Refill request

## 2011-06-29 ENCOUNTER — Emergency Department (HOSPITAL_COMMUNITY): Payer: Medicare Other

## 2011-06-29 ENCOUNTER — Encounter: Payer: Self-pay | Admitting: Family Medicine

## 2011-06-29 ENCOUNTER — Encounter (HOSPITAL_COMMUNITY): Payer: Self-pay | Admitting: Radiology

## 2011-06-29 ENCOUNTER — Observation Stay (HOSPITAL_COMMUNITY)
Admission: EM | Admit: 2011-06-29 | Discharge: 2011-06-30 | Disposition: A | Discharge status: Skilled Nursing Facility | Payer: Medicare Other | Attending: Family Medicine | Admitting: Family Medicine

## 2011-06-29 DIAGNOSIS — Z86718 Personal history of other venous thrombosis and embolism: Secondary | ICD-10-CM | POA: Insufficient documentation

## 2011-06-29 DIAGNOSIS — G8929 Other chronic pain: Principal | ICD-10-CM | POA: Insufficient documentation

## 2011-06-29 DIAGNOSIS — Z9861 Coronary angioplasty status: Secondary | ICD-10-CM | POA: Insufficient documentation

## 2011-06-29 DIAGNOSIS — I10 Essential (primary) hypertension: Secondary | ICD-10-CM | POA: Insufficient documentation

## 2011-06-29 DIAGNOSIS — R0609 Other forms of dyspnea: Secondary | ICD-10-CM | POA: Insufficient documentation

## 2011-06-29 DIAGNOSIS — J4489 Other specified chronic obstructive pulmonary disease: Secondary | ICD-10-CM | POA: Insufficient documentation

## 2011-06-29 DIAGNOSIS — J449 Chronic obstructive pulmonary disease, unspecified: Secondary | ICD-10-CM | POA: Insufficient documentation

## 2011-06-29 DIAGNOSIS — R109 Unspecified abdominal pain: Secondary | ICD-10-CM | POA: Insufficient documentation

## 2011-06-29 DIAGNOSIS — R0989 Other specified symptoms and signs involving the circulatory and respiratory systems: Secondary | ICD-10-CM | POA: Insufficient documentation

## 2011-06-29 DIAGNOSIS — Z86711 Personal history of pulmonary embolism: Secondary | ICD-10-CM | POA: Insufficient documentation

## 2011-06-29 DIAGNOSIS — R0602 Shortness of breath: Secondary | ICD-10-CM | POA: Insufficient documentation

## 2011-06-29 DIAGNOSIS — I2510 Atherosclerotic heart disease of native coronary artery without angina pectoris: Secondary | ICD-10-CM | POA: Insufficient documentation

## 2011-06-29 DIAGNOSIS — R079 Chest pain, unspecified: Secondary | ICD-10-CM | POA: Insufficient documentation

## 2011-06-29 DIAGNOSIS — E785 Hyperlipidemia, unspecified: Secondary | ICD-10-CM | POA: Insufficient documentation

## 2011-06-29 DIAGNOSIS — E119 Type 2 diabetes mellitus without complications: Secondary | ICD-10-CM | POA: Insufficient documentation

## 2011-06-29 HISTORY — DX: Essential (primary) hypertension: I10

## 2011-06-29 LAB — POCT I-STAT, CHEM 8
BUN: 19 mg/dL (ref 6–23)
Calcium, Ion: 1.12 mmol/L (ref 1.12–1.32)
Chloride: 96 mEq/L (ref 96–112)
Chloride: 99 mEq/L (ref 96–112)
Creatinine, Ser: 0.9 mg/dL (ref 0.50–1.10)
Glucose, Bld: 198 mg/dL — ABNORMAL HIGH (ref 70–99)
HCT: 29 % — ABNORMAL LOW (ref 36.0–46.0)
HCT: 28 % — ABNORMAL LOW (ref 36.0–46.0)
Potassium: 4.4 mEq/L (ref 3.5–5.1)
Potassium: 4.6 mEq/L (ref 3.5–5.1)

## 2011-06-29 LAB — DIFFERENTIAL
Basophils Relative: 0 % (ref 0–1)
Lymphs Abs: 1.6 10*3/uL (ref 0.7–4.0)
Monocytes Relative: 6 % (ref 3–12)
Neutro Abs: 5 10*3/uL (ref 1.7–7.7)
Neutrophils Relative %: 71 % (ref 43–77)

## 2011-06-29 LAB — CBC
Hemoglobin: 8.9 g/dL — ABNORMAL LOW (ref 12.0–15.0)
MCH: 26.1 pg (ref 26.0–34.0)
RBC: 3.41 MIL/uL — ABNORMAL LOW (ref 3.87–5.11)
WBC: 7 10*3/uL (ref 4.0–10.5)

## 2011-06-29 LAB — POCT I-STAT TROPONIN I

## 2011-06-29 LAB — PRO B NATRIURETIC PEPTIDE: Pro B Natriuretic peptide (BNP): 746.2 pg/mL — ABNORMAL HIGH (ref 0–125)

## 2011-06-29 MED ORDER — IOHEXOL 350 MG/ML SOLN
100.0000 mL | Freq: Once | INTRAVENOUS | Status: AC | PRN
  Administered 2011-06-29: 100 mL via INTRAVENOUS
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
## 2011-06-30 DIAGNOSIS — F329 Major depressive disorder, single episode, unspecified: Secondary | ICD-10-CM

## 2011-06-30 DIAGNOSIS — R0602 Shortness of breath: Secondary | ICD-10-CM

## 2011-06-30 DIAGNOSIS — R0789 Other chest pain: Secondary | ICD-10-CM

## 2011-06-30 LAB — CBC
MCH: 25.6 pg — ABNORMAL LOW (ref 26.0–34.0)
MCHC: 31 g/dL (ref 30.0–36.0)
MCV: 82.3 fL (ref 78.0–100.0)
Platelets: 148 10*3/uL — ABNORMAL LOW (ref 150–400)
RBC: 3.17 MIL/uL — ABNORMAL LOW (ref 3.87–5.11)
RDW: 14.4 % (ref 11.5–15.5)

## 2011-06-30 LAB — COMPREHENSIVE METABOLIC PANEL
Alkaline Phosphatase: 64 U/L (ref 39–117)
BUN: 14 mg/dL (ref 6–23)
CO2: 35 mEq/L — ABNORMAL HIGH (ref 19–32)
Chloride: 100 mEq/L (ref 96–112)
Creatinine, Ser: 0.75 mg/dL (ref 0.50–1.10)
GFR calc non Af Amer: >60 mL/min (ref >60)
Glucose, Bld: 204 mg/dL — ABNORMAL HIGH (ref 70–99)
Potassium: 4.5 mEq/L (ref 3.5–5.1)
Total Bilirubin: 0.2 mg/dL — ABNORMAL LOW (ref 0.3–1.2)

## 2011-06-30 LAB — GLUCOSE, CAPILLARY: Glucose-Capillary: 188 mg/dL — ABNORMAL HIGH (ref 70–99)

## 2011-06-30 LAB — CARDIAC PANEL(CRET KIN+CKTOT+MB+TROPI)
CK, MB: 2.5 ng/mL (ref 0.3–4.0)
Total CK: 26 U/L (ref 7–177)
Total CK: 26 U/L (ref 7–177)

## 2011-06-30 LAB — CK TOTAL AND CKMB (NOT AT ARMC)
CK, MB: 2.4 ng/mL (ref 0.3–4.0)
Relative Index: INVALID (ref 0.0–2.5)
Total CK: 31 U/L (ref 7–177)

## 2011-06-30 LAB — TROPONIN I: Troponin I: <0.3 ng/mL (ref <0.30)

## 2011-06-30 NOTE — H&P (Signed)
Holly Kim is an 70 y.o. female.   Chief Complaint: CP and SOB HPI: Pt has had a one month history of increasing shortness of breath who had increasing pain in her chest starting this morning. Pt states that in the last month, she has been using her home O2 24/7 and had good saturations but has a subjective feeling that she "cant breathe". Her chest pain started this morning on the left side. Pt states her pain is worse without her oxygen, but Morphine made it better. She has never had a pain like this that she can remember. She states her pain is a 10/10 but Morphine made it a 6/10. Pt denies any fever or sick contacts, but she has had a cough productive of white sputum. She states her sputum production is not more than usual but she is coughing more than usual.  In the ED today, first set of cardiac enzymes was negative, CXR was stable, Ct with Angio did not show PE, BNP 746.2, EKG unchanged.   Past Medical History  Diagnosis Date  . NSTEMI (non-ST elevated myocardial infarction) 06/2010  . PE (pulmonary embolism)   . DVT (deep venous thrombosis)   . Diabetes mellitus   . Asthma   . Hypertension     Past Surgical History  Procedure Date  . US echocardiography 2008  . Orif ankle fracture   . Appendectomy   . Cervical discectomy   . Ptca 06/2010  . Tonsillectomy   . Transthoracic echocardiogram 06/2010    Grade 1 diastolic, EF 55-60    Family History  Problem Relation Age of Onset  . Breast cancer      aunt  . Alcohol abuse Son   . Cancer Daughter    Social History: Pt lives at home with her son who is an alcoholic. Son is a smoker. She has a home health aid that comes 3 hours/day during the week and 2 hours/day on weekends. She is not very active in her house.   Allergies:  Allergies  Allergen Reactions  . Ibuprofen     REACTION: "mouth swells up and I can't swallow good"  . Amlodipine Besylate     REACTION: hypotension  . Amoxicillin     REACTION: itching  .  Haloperidol Lactate     REACTION: My head gets places before my body  . Naproxen     REACTION: rash    Medications Prior to Admission  Medication Dose Route Frequency Provider Last Rate Last Dose  . iohexol (OMNIPAQUE) 350 MG/ML injection 100 mL  100 mL Intravenous Once PRN Medication Radiologist   100 mL at 06/29/11 1930   Medications Prior to Admission  Medication Sig Dispense Refill  . aspirin 81 MG EC tablet Take 81 mg by mouth daily.        . B-D INS SYR ULTRAFINE 1CC/31G 31G X 5/16" 1 ML MISC USE AS DIRECTED  100 each  3  . B-D INS SYRINGE 0.5CC/31GX5/16 31G X 5/16" 0.5 ML MISC USE FOUR TIMES A DAY  100 each  6  . clopidogrel (PLAVIX) 75 MG tablet Take 1 tablet (75 mg total) by mouth daily.  90 tablet  0  . CRESTOR 40 MG tablet TAKE ONE (1) TABLET EACH DAY AT BEDTIME  31 tablet  6  . erythromycin (E-MYCIN) 250 MG tablet Take 1 tablet (250 mg total) by mouth 3 (three) times daily. 30 minutes before meals  90 tablet  3  . ferrous sulfate 325 (  65 FE) MG tablet Take 1 tablet (325 mg total) by mouth daily.  90 tablet  2  . insulin glargine (LANTUS) 100 UNIT/ML injection Inject 65 Units into the skin daily.       Marland Kitchen lisinopril (PRINIVIL,ZESTRIL) 5 MG tablet TAKE 1 TABLET BY MOUTH EVERY DAY  90 tablet  0  . metoCLOPramide (REGLAN) 5 MG tablet TAKE ONE TABLET BEFORE MEALS AND AT     BEDTIME                                                  GENERIC FOR REGLAN 5  120 tablet  2  . metoprolol tartrate (LOPRESSOR) 25 MG tablet TAKE ONE TABLET TWICE DAILY                                                  GENERIC FOR LOPRESSO  60 tablet  0  . Multiple Vitamins-Minerals (SENIOR MULTIVITAMIN PLUS) TABS Take 1 tablet by mouth daily.        Marland Kitchen NOVOLIN R 100 UNIT/ML injection INJECT 18 UNITS INTO THE SKIN 3 (THREE) TIMES DAILY WITH MEALS  10 mL  3  . nystatin (MYCOSTATIN) cream Apply topically 2 (two) times daily. Apply to affected area        . nystatin (MYCOSTATIN) powder Apply topically 2 (two) times  daily. Apply to affected area   15 g  3  . promethazine (PHENERGAN) 25 MG tablet Take 1 tablet (25 mg total) by mouth every 6 (six) hours as needed for nausea.  120 tablet  3  . ranitidine (ZANTAC) 150 MG capsule Take 150 mg by mouth 2 (two) times daily.        Marland Kitchen senna (SENOKOT) 8.6 MG tablet Take 3 tablets by mouth daily.       . sucralfate (CARAFATE) 1 GM tablet Take 1 g by mouth 3 (three) times daily before meals.        . torsemide (DEMADEX) 20 MG tablet Take 1 tablet (20 mg total) by mouth daily. Med change.  No need for refill now.  1 tablet     **Pt states she is going to pain clinic and gets Morphine for her pain as an outpatient. We contacted her pharmacy and it does not appear that she has gotten any long term pain medications filled.   Results for orders placed during the hospital encounter of 06/29/11 (from the past 48 hour(s))  DIFFERENTIAL     Status: Normal   Collection Time   06/29/11  4:18 PM      Component Value Range Comment   Neutrophils Relative 71  43 - 77 (%)    Neutro Abs 5.0  1.7 - 7.7 (K/uL)    Lymphocytes Relative 23  12 - 46 (%)    Lymphs Abs 1.6  0.7 - 4.0 (K/uL)    Monocytes Relative 6  3 - 12 (%)    Monocytes Absolute 0.4  0.1 - 1.0 (K/uL)    Eosinophils Relative 1  0 - 5 (%)    Eosinophils Absolute 0.1  0.0 - 0.7 (K/uL)    Basophils Relative 0  0 - 1 (%)    Basophils Absolute 0.0  0.0 -  0.1 (K/uL)   CBC     Status: Abnormal   Collection Time   06/29/11  4:18 PM      Component Value Range Comment   WBC 7.0  4.0 - 10.5 (K/uL)    RBC 3.41 (*) 3.87 - 5.11 (MIL/uL)    Hemoglobin 8.9 (*) 12.0 - 15.0 (g/dL)    HCT 16.1 (*) 09.6 - 46.0 (%)    MCV 83.0  78.0 - 100.0 (fL)    MCH 26.1  26.0 - 34.0 (pg)    MCHC 31.4  30.0 - 36.0 (g/dL)    RDW 04.5  40.9 - 81.1 (%)    Platelets 161  150 - 400 (K/uL)   PRO B NATRIURETIC PEPTIDE     Status: Abnormal   Collection Time   06/29/11  4:18 PM      Component Value Range Comment   BNP, POC 746.2 (*) 0 - 125 (pg/mL)     POCT I-STAT TROPONIN I     Status: Normal   Collection Time   06/29/11  4:29 PM      Component Value Range Comment   Troponin i, poc 0.01  0.00 - 0.08 (ng/mL)    Comment 3            POCT I-STAT, CHEM 8     Status: Abnormal   Collection Time   06/29/11  4:31 PM      Component Value Range Comment   Sodium 140  135 - 145 (mEq/L)    Potassium 4.4  3.5 - 5.1 (mEq/L)    Chloride 96  96 - 112 (mEq/L)    BUN 18  6 - 23 (mg/dL)    Creatinine, Ser 9.14  0.50 - 1.10 (mg/dL)    Glucose, Bld 782 (*) 70 - 99 (mg/dL)    Calcium, Ion 9.56  1.12 - 1.32 (mmol/L)    TCO2 34  0 - 100 (mmol/L)    Hemoglobin 9.9 (*) 12.0 - 15.0 (g/dL)    HCT 21.3 (*) 08.6 - 46.0 (%)   POCT I-STAT, CHEM 8     Status: Abnormal   Collection Time   06/29/11  9:12 PM      Component Value Range Comment   Sodium 139  135 - 145 (mEq/L)    Potassium 4.6  3.5 - 5.1 (mEq/L)    Chloride 99  96 - 112 (mEq/L)    BUN 19  6 - 23 (mg/dL)    Creatinine, Ser 5.78  0.50 - 1.10 (mg/dL)    Glucose, Bld 469 (*) 70 - 99 (mg/dL)    Calcium, Ion 6.29  1.12 - 1.32 (mmol/L)    TCO2 32  0 - 100 (mmol/L)    Hemoglobin 9.5 (*) 12.0 - 15.0 (g/dL)    HCT 52.8 (*) 41.3 - 46.0 (%)   CK TOTAL AND CKMB     Status: Normal   Collection Time   06/29/11 11:24 PM      Component Value Range Comment   Total CK 31  7 - 177 (U/L)    CK, MB 2.4  0.3 - 4.0 (ng/mL)    Relative Index RELATIVE INDEX IS INVALID  0.0 - 2.5    TROPONIN I     Status: Normal   Collection Time   06/29/11 11:24 PM      Component Value Range Comment   Troponin I <0.30  <0.30 (ng/mL)    Dg Chest 2 View  06/29/2011  *RADIOLOGY REPORT*  Clinical Data:  Chest pain since this morning. History of hypertension, diabetes, COPD.  Cough, wheezing.  CHEST - 2 VIEW  Comparison: 05/17/2011  Findings: The heart is enlarged.  There is left pleural effusion associated with left lung base density which has appear stable over multiple prior studies.  The right lung appears clear.  Perihilar bronchitic  changes are stable.  No pulmonary edema.  IMPRESSION:  1.  Cardiomegaly. 2.  Persistent left lung base opacity, stable in appearance.  Original Report Authenticated By: Patterson Hammersmith, M.D.   Ct Angio Chest W/cm &/or Wo Cm  06/29/2011  *RADIOLOGY REPORT*  Clinical Data:  Mid chest pain, right lower chest pain, shortness of breath, history asthma, CHF, COPD, diabetes, hypertension, MI  CT ANGIOGRAPHY CHEST WITH CONTRAST  Technique:  Multidetector CT imaging of the chest was performed using the standard protocol during bolus administration of intravenous contrast.  Multiplanar CT image reconstructions including MIPs were obtained to evaluate the vascular anatomy.  Contrast:  100 ml Omnipaque 350 IV.  Comparison:  06/14/2010  Findings: Scattered atherosclerotic calcifications aorta without aneurysm or dissection. Degradation of image quality secondary to beam hardening due patient size. Scattered respiratory motion artifacts. Within the above limitations, no gross evidence of pulmonary embolism identified. Rounded atelectasis versus pneumonia posteriorly in left lower lobe. Minimal bibasilar atelectasis otherwise seen. Small left pleural effusion. Remaining lungs clear. Visualized portions of liver and spleen unremarkable. No definite thoracic adenopathy. Scattered coronary arterial calcifications. Diffuse osseous demineralization Osseous demineralization. Cardiac enlargement.  Review of the MIP images confirms the above findings.  IMPRESSION: Degradation of image quality as above. No gross evidence of pulmonary embolism. Small left pleural effusion with rounded atelectasis versus consolidation in left lower lobe. Enlarged heart.  Original Report Authenticated By: Lollie Marrow, M.D.    Review of Systems  Constitutional: Negative for fever and chills.  HENT: Positive for congestion and sore throat.   Respiratory: Positive for cough, sputum production, shortness of breath and wheezing. Negative for  hemoptysis.   Cardiovascular: Positive for chest pain.  Gastrointestinal: Positive for abdominal pain.  Genitourinary: Negative for dysuria.  Musculoskeletal: Positive for myalgias.  Skin: Negative for rash.  Neurological: Positive for headaches. Negative for focal weakness.    There were no vitals taken for this visit. Physical Exam  Constitutional: She appears well-developed. She is cooperative. No distress.  HENT:  Head: Normocephalic and atraumatic.  Mouth/Throat: No oropharyngeal exudate or tonsillar abscesses.       Large tongue. No teeth; gums within normal limits  Eyes: Conjunctivae and EOM are normal. Pupils are equal, round, and reactive to light.  Neck: Normal range of motion. Neck supple.  Cardiovascular: Normal rate, regular rhythm and intact distal pulses.   Respiratory: Accessory muscle usage present. Not tachypneic. No respiratory distress. She has decreased breath sounds. She has wheezes.  GI: Soft. Bowel sounds are normal. There is generalized tenderness.       Morbidly obese  Lymphadenopathy:    She has no cervical adenopathy.  Neurological: She is alert. She has normal strength. No cranial nerve deficit.  Skin: Skin is warm and dry. No abrasion and no rash noted. She is not diaphoretic. No pallor.       Buttocks, upper thighs and posterior LU red but no skin breakdown     Assessment/Plan 70 yo female with PMH of COPD, CAD, MI x2, DM, and morbid obesity presenting with chronic SOB and acute chest pain.  1. Chest pain: Admit to tele bed, observation status. Given  significant cardiac history, we will rule out cardiac cause of her chest pain. Will get CE x3, repeat EKG in the morning and keep her on continuous CR monitors. Pt has significant lung disease therefore we will avoid medications that will depress her respiratory status too much. It is possible this chest pain could be musculoskeletal in nature given her increased cough, so we will give Tylenol scheduled and  Tramadol as needed. 2. SOB: Pt has chronic airway obstruction requiring continuous oxygen at home. She does not meet Golds criteria (no fever, no increased sputum production) so we will not begin antibiotics at this time. She did start having significant wheezing following her CXR today which could be contributing to her discomfort. Will keep patient on 3L of O2 and give albuterol prn for wheezing. We will do a home med rec tomorrow and restart all of the rest of her home medications at that time.  3. DM: Glucose 198 on labs at admission. Pt takes Lantus 65U at home. Will start Lantus 30U daily with Sensitive SSI and CBG fasting, prior to meals and at bedtime. Pt is on Erythromycin and Reglan per clinic notes for gastroparesis. Will restart these tomorrow. Continue to monitor. 4. HTN: BP 130/40 at time of H&P. On Lopressor at home. Continue to monitor 5. FEN/GI: Taking good PO. Carb modified diet. Saline lock IV 6. PPx: Protonix 40mg  daily. Heparin 5000U SQ 7. Dispo: Pending cardiac enzymes and EKG negative. Pt has had SOB for a long time. Discussed that her lung disease would not improve, and she will continue to need oxygen but it is most important that her saturations stay high which they have been. Likely discharge tomorrow if labs return normal.   PCP: [place] Code status: Full Code  [place], AMBER 06/30/2011, 2:00 AM

## 2011-07-01 NOTE — H&P (Signed)
Holly Kim, Holly Kim NO.:  [id number]  MEDICAL RECORD NO.:  [id number]  LOCATION:  [facility]                         FACILITY:  [facility]  PHYSICIAN:  Santiago Bumpers. Jessaca Philippi, M.D.DATE OF BIRTH:  1941/04/09  DATE OF ADMISSION:  06/29/2011 DATE OF DISCHARGE:                             HISTORY & PHYSICAL   PRIMARY CARE PHYSICIAN:  Sarah [place], MD, at [facility].  CHIEF COMPLAINT:  Chest pain and shortness of breath.  HISTORY OF PRESENT ILLNESS:  The patient has had a 23-month history of increasing shortness of breath who had increasing pain in her chest starting this morning.  The patient states in the last month she has been using her home oxygen 24x7 and has had good saturations, but continues to have a subjective feeling that she cannot breathe.  Her chest pain started this morning on the left side and substernal.  She states her pain is worse without her oxygen, but morphine did make it better.  She has never had pain like this that she can remember.  She states that her pain is an 8/10 cm, but morphine made it as 6/10.  The patient denies any fever or sick contacts, but she has had a cough productive of white sputum.  She states her sputum production is not more than usual, but she is coughing more than usual.  In the emergency department, today her first set of cardiac enzymes were negative.  Chest x-ray was stable and CT with angio did not show any PE.  The patient had a BMP which was 746.2 and her EKG is unchanged from previous tracing.  PAST MEDICAL HISTORY: 1. NSTEMI. 2. Pulmonary embolism. 3. Diabetes. 4. Asthma. 5. Hypertension.  PAST SURGICAL HISTORY:  Appendectomy, tonsillectomy, transthoracic echocardiogram in 2011, and cervical diskectomy.  FAMILY HISTORY:  Son has alcohol abuse.  Daughter has cancer and an aunt has breast cancer.  SOCIAL HISTORY:  The patient lives at home with her son who is an alcoholic.  Son is also a smoker  and smokes in the house.  She has a home health aide that comes 3 hours a day in the week and 2 hours a day on the weekend.  She is not very active in her house.  The patient does not smoke and denies any alcohol use.  ALLERGIES: 1. IBUPROFEN. 2. AMLODIPINE. 3. AMOXICILLIN. 4. HALDOL. 5. NAPROXEN.  HOME MEDICATIONS: 1. Aspirin 81 mg daily. 2. Plavix 75 mg daily. 3. Crestor 40 mg nightly. 4. Erythromycin 1 tablet t.i.d. before meals. 5. Iron 325 daily. 6. Lantus 67 units daily. 7. Lisinopril 5 mg p.o. daily. 8. Reglan 5 mg 1 tablet before meals and at bedtime. 9. Lopressor 25 mg b.i.d. 10.Multivitamin daily. 11.NovoLog 18 units t.i.d. with meals. 12.Nystatin apply topically 2 times a day. 13.Promethazine 25 mg 1 tablet by mouth every 6 hours as needed for     nausea. 14.Zantac 150 mg b.i.d. 15.Senokot 8.6 mg 3 tablets daily. 16.Carafate 1 g t.i.d. before meals. 17.Demadex 20 mg by mouth daily.  The patient states she is going to [facility] and can get morphine for her chronic pain as an outpatient.  We contacted to her pharmacy and it does not  appear that she has gotten any long-term pain medication still there.  LABORATORY DATA:  White blood cell count 7.0, hemoglobin 8.9, hematocrit 28.3, and platelets 161.  BNP is 746.2.  Point-of-care testing: Troponin 0.01.  First set of enzymes:  Total CK 31 and CK-MB 2.8.  RADIOLOGY:  Two-view chest x-ray showed cardiomegaly and persistent left lung base opacity which appears stable.  The patient also had a CT angio of her chest which showed no gross evidence of pulmonary embolism, small left pleural fusion with rounded atelectasis.  REVIEW OF SYSTEMS:  CONSTITUTIONAL:  Negative for fever or chills. Positive for congestion and sore throat.  Positive for cough, sputum production, shortness of breath, and wheezing.  Negative for hemoptysis. CARDIOVASCULAR:  Positive chest pain.  GASTROINTESTINAL:  Positive for abdominal pain.   Negative for dysuria.  VASCULAR:  Positive for myalgias.  SKIN:  Negative for rash.  NEUROLOGIC:  Positive for headaches.  No focal weakness.  PHYSICAL EXAMINATION:  CONSTITUTIONAL:  She appears well developed.  She is cooperative, in no distress. HEENT:  Normocephalic and atraumatic.  No oropharyngeal exudate or tonsillar abscess.  The patient has a large tongue.  No teeth, but the gums are within normal limits.  Pupils are equal, round, and reactive to light. NECK:  Normal range of motion and supple. CARDIOVASCULAR:  Normal rate, regular rhythm.  Intact distal pulses. RESPIRATORY:  Accessory muscle usage is present.  The patient is not tachypneic at this time and in no respiratory distress.  She has decreased breath sounds and wheezes diffusely. GASTROINTESTINAL:  Soft.  Morbidly obese abdomen.  Bowel sounds are present.  There is generalized tenderness. NEUROLOGIC:  She is alert.  She has normal strength.  No cranial nerve deficit. SKIN:  Warm and dry.  No abrasion or rash noted.  She is not diaphoretic.  Buttocks and upper thigh as well as posterior lower extremities are red, but has no skin breakdown and no signs or symptoms of infection.  ASSESSMENT/PLAN:  The patient is a 70 year old female with past medical history of chronic obstructive pulmonary disease, coronary artery disease, myocardial infarction, diabetes, and morbid obesity presenting with chronic shortness of breath with acute onset chest pain, 1. Chest pain.  Admit to tele bed, observation status on Family     Medicine Teaching Service.  Given significant cardiac history, we     will rule out cardiac cause of her pain.  We will get cardiac     enzymes x3 and repeat an EKG in the morning.  The patient will also     need to be on continuous CR monitors.  The patient has a     significant lung disease, therefore we will avoid any medications     that may depress her respiratory status too much.  It is possible      that this chest pain could be musculoskeletal in nature given her     increased cough, so we will give Tylenol as scheduled and tramadol     as needed. 2. Shortness of breath.  The patient has chronic airway obstruction     requiring continuous oxygen at home.  She does not meet GOLD     criteria (no fever, no increased sputum production).  So, we will     not begin antibiotics at this time.  She did start having     significant wheezing following her chest x-ray today, which could     be contributing to her discomfort.  We will  keep the patient on 3     liters of O2 and give albuterol as needed for wheezing.  We will do     a home med rec tomorrow and restart all of the medications that we     have not started yet. 3. Diabetes.  Glucose 189 on the labs on admission.  The patient takes     65 units of Lantus at home.  We will start Lantus 30 units     assistive sliding scale insulin and CBG testing fasting prior to     meals and at bedtime.  The patient is on erythromycin and Reglan     per clinic notes for gastroparesis.  We will restart these home     medications tomorrow after med rec.  Continue to monitor. 4. Hypertension.  Blood pressure 130/48 at the time of H and P, on     Lopressor at home.  Continue to monitor. 5. Fluids, electrolytes, nutrition and gastrointestinal.  The patient     is taking good p.o.  We will resume carb-modified diet as     tolerated.  Saline lock IV. 6. Prophylaxis.  Protonix 40 mg daily, heparin 5000 units subcu. 7. Disposition.  Pending cardiac enzymes and EKG negative.  The     patient has had shortness of breath for a long time.  We have     discussed with her that her lung disease would not improve and she     will continue to need oxygen, but it is most important that her     saturations stay high, which they have been.  Likely, discharge     home tomorrow if these labs return within normal limits.    ______________________________ Rodman Pickle, MD   ______________________________ Santiago Bumpers Leveda Anna, M.D.    AH/MEDQ  D:  06/30/2011  T:  06/30/2011  Job:  [id number]  Electronically Signed by Rodman Pickle  on 07/01/2011 01:00:12 AM Electronically Signed by Doralee Albino M.D. on 07/01/2011 03:33:42 PM

## 2011-07-03 ENCOUNTER — Other Ambulatory Visit: Payer: Self-pay | Admitting: Family Medicine

## 2011-07-04 NOTE — Telephone Encounter (Signed)
Refill request

## 2011-07-07 ENCOUNTER — Telehealth: Payer: Self-pay | Admitting: Family Medicine

## 2011-07-07 NOTE — Telephone Encounter (Signed)
Received request for equipment for Holly Kim.  Form completed, and rx done for hospital bed, electric;Portable raised commode seat; bedside commode, and home wheelchair ramp.  Will mail rx since they are handwritten on rx pad and will not fax well.

## 2011-07-08 ENCOUNTER — Ambulatory Visit (INDEPENDENT_AMBULATORY_CARE_PROVIDER_SITE_OTHER): Payer: Medicare Other | Admitting: Family Medicine

## 2011-07-08 VITALS — BP 149/71 | HR 60 | Temp 98.4°F | Wt 273.0 lb

## 2011-07-08 DIAGNOSIS — J189 Pneumonia, unspecified organism: Secondary | ICD-10-CM

## 2011-07-08 DIAGNOSIS — J449 Chronic obstructive pulmonary disease, unspecified: Secondary | ICD-10-CM

## 2011-07-08 MED ORDER — ALBUTEROL SULFATE HFA 108 (90 BASE) MCG/ACT IN AERS: 2.0000 | INHALATION_SPRAY | Freq: Four times a day (QID) | RESPIRATORY_TRACT | Status: DC | PRN

## 2011-07-08 MED ORDER — MOXIFLOXACIN HCL 400 MG PO TABS: 400.0000 mg | ORAL_TABLET | Freq: Every day | ORAL | Status: AC

## 2011-07-08 MED ORDER — ALBUTEROL SULFATE (2.5 MG/3ML) 0.083% IN NEBU
2.5000 mg | INHALATION_SOLUTION | Freq: Once | RESPIRATORY_TRACT | Status: AC
  Administered 2011-07-08: 2.5 mg via RESPIRATORY_TRACT

## 2011-07-08 NOTE — Patient Instructions (Addendum)
Dr. Raymondo Band for PFT, teaching on MDI use, and instructions for use, make sure it is 7-10 days from now Avelox for 7 days to treat the congestion and wheezing in your lungs Use your inhaler with the spacer every 4 hours as needed

## 2011-07-08 NOTE — Discharge Summary (Signed)
NAMEKELY, DOHN NO.:  [id number]  MEDICAL RECORD NO.:  [id number]  LOCATION:  [facility]                         FACILITY:  [facility]  PHYSICIAN:  Santiago Bumpers. Maddilyn Campus, M.D.DATE OF BIRTH:  1941/02/19  DATE OF ADMISSION:  06/29/2011 DATE OF DISCHARGE:  06/30/2011                              DISCHARGE SUMMARY   PRIMARY CARE PHYSICIAN:  Sarah Swaziland, MD at [facility]  REASON FOR ADMISSION:  Chest pain and shortness of breath, ruled out MI.  DISCHARGE DIAGNOSES: 1. Chronic pain, most likely etiology of chest pain and subjective     shortness of breath, myocardial infarction ruled out. 2. Morbid obesity. 3. Diabetes type 2. 4. Hypertension.  DISCHARGE MEDICATIONS:  NEW MEDICATIONS AT DISCHARGE:  None.  HOME MEDICATIONS CONTINUED: 1. Aspirin 81 mg p.o. daily. 2. Barrier cream application topically every morning. 3. Carafate 1 g 1 tablet p.o. 3 times daily before meals. 4. Crestor 40 mg 1 tablet p.o. daily at bedtime. 5. Desitin one application topically twice daily as needed for     bedsores. 6. Colace over-the-counter 1 capsule p.o. every morning. 7. Ferrous sulfate 325 one tablet p.o. q.a.m. 8. Lantus 55 units subcu daily at bedtime. 9. Lisinopril 5 mg 1 tablet p.o. q.a.m. 10.Metoclopramide 5 mg 1 tablet p.o. before meals at bedtime. 11.Metoprolol tartrate 25 mg 1 tablet p.o. twice daily. 12.MS Contin 50 mg 1 tablet p.o. twice daily.  Per the patient, this     was prescribed by her physician at pain clinic, but this is true     she may continue to follow up with physician at pain clinic for     medication management. 13.Multivitamin 1 mL 1 tablet p.o. every morning. 14.Nitroglycerin sublingual 0.4 mg 1 tablet under tongue every 5     minutes as needed for chest pain up to 3 doses. 15.Novolin 18 units subcu 3 times daily before meals. 16.Nystatin powder 100,000 units/g 1 application topically twice     daily. 17.Nystatin topical  cream 100,000 units/g 1 application topically     twice daily. 18.Plavix 75 mg 1 tablet p.o. every morning. 19.Phenergan 25 mg 1 tablet p.o. q.6 h. as needed for nausea. 20.Protonix 40 mg 1 tablet p.o. twice daily. 21.Ranitidine 150 mg 1 tablet p.o. twice daily. 22.Senna 8.6 mg 3 tablets p.o. q.a.m. 23.Torsemide 20 mg half tablet p.o. twice daily.  PERTINENT LABS AND STUDIES AT DISCHARGE:  Cardiac enzymes are negative x3 sets not including point-of-care cardiac enzymes.  PERTINENT STUDIES:  EKG was unchanged from admission.  CONSULTS:  None.  BRIEF HOSPITAL COURSE:  The patient is a 70 year old female with a known history of chronic pain, morbid obesity, and drug-seeking behavior who presents with chief complaint of substernal and right chest pain and shortness of breath.  She was admitted for rule out acute myocardial infarction. 1. Chest pain and shortness of breath.  The patient was admitted for     MI rule out and ruled out with 3 sets of negative cardiac enzymes,     chest x-ray, and EKGs were within normal limits.       She was not given narcotic while on the floor, although     she did  receive morphine in the ED.  She was given Toradol and     Tylenol p.r.n. for pain and stated that neither improved her pain.     It is likely that the patient's pain is secondary to her chronic     pain syndrome as well as her subjective shortness of breath as     saturations are 100% and she is at rest and is still complaining of     shortness of breath.  Plan:  Since the patient is ruled out, she     will discharge to home with instructions to follow up with her PCP.     The patient has expressed desire to switch PCPs and the patient is     advised to discuss this with her PCP if this is her desire.  The     patient also reports that she does attend a pain clinic.  There is     no record of pain clinic in the patient's office chart.  Recommend     followup.  The patient follows up with PCP  and/or pain clinic     provider if she has one. 2. Hypertension.  Blood pressure is well controlled.  Continue home     antihypertensive regimen. 3. Diabetes.  The patient was placed on Lantus and sliding scale on     admission.  Blood sugars slightly elevated this morning at 178.     Advised the patient to continue home regimen upon discharge.  FOLLOWUP ISSUES AND RECOMMENDATIONS: 1. Follow up pain and whether the patient does attend pain clinic.     Attempted to determine that the patient truly does have morphine     and there was no evidence when you follow up the patient's pharmacy     list.  She has filled prescription for MS Contin in the recent     past. 2. Primary care.  The patient reports that the therapeutic     relationship with her primary care provider is not effective for     her.  If this is the case, the patient is free to switch primary     care to another provider or clinic.  DISCHARGE INSTRUCTIONS:  The patient is discharged to home with no limitations on activity.  Instructed to consume a heart-healthy diet. Follow up with her primary care provider to assess the above mentioned issues within the next week.  DISCHARGE CONDITION:  The patient is discharged to home in stable medical condition.    ______________________________ Dessa Phi, MD   ______________________________ Santiago Bumpers. Leveda Anna, M.D.    JF/MEDQ  D:  06/30/2011  T:  06/30/2011  Job:  [id number]  cc:   Sarah Swaziland, MD  Electronically Signed by Dessa Phi MD on 07/07/2011 11:23:00 PM Electronically Signed by Doralee Albino M.D. on 07/08/2011 09:05:19 AM

## 2011-07-08 NOTE — Assessment & Plan Note (Addendum)
Clinical diagnosis, although no fever, but has consolidation of rales in bases, + wheezing with that increased after albuterol neb.  She will be heading back to the ER if not treated, will treat with Avalox . Wanted to prescribe prednisone but she refused to take it. CT angio in the hospital did show: consolidation in the LLL.

## 2011-07-08 NOTE — Progress Notes (Signed)
  Subjective:    Patient ID: Holly Kim, female    DOB: 01-15-1941, 70 y.o.   MRN: [id number]  HPI 2 days in the hospital for shortness of breath, worked up for angina.  She reports still being short of breath even though her oxygen levels are high.  Her chest is tight.   She filled me in on her complicated family life and the difficulty that she has paying bills and getting food.    Review of Systems  Respiratory: Positive for cough, chest tightness, shortness of breath and wheezing.        Objective:   Physical Exam  Constitutional:       Long facial hair, alert, wearing O2 by nasal cannula  HENT:  Right Ear: External ear normal.  Left Ear: External ear normal.  Cardiovascular: Normal rate and regular rhythm.   Pulmonary/Chest: She has wheezes. She has rales.  Lymphadenopathy:    She has no cervical adenopathy.          Assessment & Plan:

## 2011-07-15 ENCOUNTER — Ambulatory Visit (INDEPENDENT_AMBULATORY_CARE_PROVIDER_SITE_OTHER): Payer: Medicare Other | Admitting: Family Medicine

## 2011-07-15 VITALS — BP 135/51 | HR 59 | Temp 98.4°F

## 2011-07-15 DIAGNOSIS — E1165 Type 2 diabetes mellitus with hyperglycemia: Secondary | ICD-10-CM

## 2011-07-15 DIAGNOSIS — E118 Type 2 diabetes mellitus with unspecified complications: Secondary | ICD-10-CM

## 2011-07-15 DIAGNOSIS — J449 Chronic obstructive pulmonary disease, unspecified: Secondary | ICD-10-CM

## 2011-07-15 DIAGNOSIS — R0989 Other specified symptoms and signs involving the circulatory and respiratory systems: Secondary | ICD-10-CM

## 2011-07-15 DIAGNOSIS — R0789 Other chest pain: Secondary | ICD-10-CM

## 2011-07-15 DIAGNOSIS — R0600 Dyspnea, unspecified: Secondary | ICD-10-CM

## 2011-07-15 MED ORDER — IPRATROPIUM BROMIDE 0.02 % IN SOLN
0.5000 mg | Freq: Once | RESPIRATORY_TRACT | Status: AC
Start: 2011-07-15 — End: 2011-07-15
  Administered 2011-07-15: 0.5 mg via RESPIRATORY_TRACT

## 2011-07-15 MED ORDER — TORSEMIDE 20 MG PO TABS: 10.0000 mg | ORAL_TABLET | Freq: Every day | ORAL | Status: DC

## 2011-07-15 MED ORDER — ALBUTEROL SULFATE (2.5 MG/3ML) 0.083% IN NEBU
2.5000 mg | INHALATION_SOLUTION | Freq: Once | RESPIRATORY_TRACT | Status: AC
  Administered 2011-07-15: 2.5 mg via RESPIRATORY_TRACT

## 2011-07-15 MED ORDER — TORSEMIDE 20 MG PO TABS: 20.0000 mg | ORAL_TABLET | Freq: Every day | ORAL | Status: DC

## 2011-07-15 NOTE — Patient Instructions (Addendum)
We should change your torsemide.  Take 1 pill every morning instead of taking 1/2 pill twice a day.  This is your fluid pill, and I think it will help with the breathing and the fluid on your leg. I am glad you are seeing the pain clinic tomorrow.  Keep [name] posted on how things are going there. If you develop more trouble breathing, if the chest pain gets worse, please call us or go back to the ER.

## 2011-07-16 ENCOUNTER — Encounter: Payer: Self-pay | Admitting: Family Medicine

## 2011-07-16 ENCOUNTER — Telehealth: Payer: Self-pay | Admitting: *Deleted

## 2011-07-16 ENCOUNTER — Other Ambulatory Visit: Payer: Self-pay | Admitting: Family Medicine

## 2011-07-16 ENCOUNTER — Ambulatory Visit: Payer: Medicare Other | Admitting: Physical Medicine & Rehabilitation

## 2011-07-16 ENCOUNTER — Encounter: Payer: Medicare Other | Attending: Physical Medicine & Rehabilitation

## 2011-07-16 DIAGNOSIS — G609 Hereditary and idiopathic neuropathy, unspecified: Secondary | ICD-10-CM | POA: Insufficient documentation

## 2011-07-16 DIAGNOSIS — E1142 Type 2 diabetes mellitus with diabetic polyneuropathy: Secondary | ICD-10-CM

## 2011-07-16 DIAGNOSIS — M961 Postlaminectomy syndrome, not elsewhere classified: Secondary | ICD-10-CM | POA: Insufficient documentation

## 2011-07-16 DIAGNOSIS — M549 Dorsalgia, unspecified: Secondary | ICD-10-CM | POA: Insufficient documentation

## 2011-07-16 DIAGNOSIS — M79609 Pain in unspecified limb: Secondary | ICD-10-CM | POA: Insufficient documentation

## 2011-07-16 DIAGNOSIS — M47817 Spondylosis without myelopathy or radiculopathy, lumbosacral region: Secondary | ICD-10-CM

## 2011-07-16 DIAGNOSIS — M542 Cervicalgia: Secondary | ICD-10-CM | POA: Insufficient documentation

## 2011-07-16 DIAGNOSIS — M545 Low back pain, unspecified: Secondary | ICD-10-CM | POA: Insufficient documentation

## 2011-07-16 DIAGNOSIS — R0600 Dyspnea, unspecified: Secondary | ICD-10-CM | POA: Insufficient documentation

## 2011-07-16 MED ORDER — ALBUTEROL SULFATE (2.5 MG/3ML) 0.083% IN NEBU: 2.5000 mg | INHALATION_SOLUTION | Freq: Four times a day (QID) | RESPIRATORY_TRACT | Status: DC | PRN

## 2011-07-16 MED ORDER — INSULIN REGULAR HUMAN 100 UNIT/ML IJ SOLN: 18.0000 [IU] | Freq: Three times a day (TID) | INTRAMUSCULAR | Status: DC

## 2011-07-16 NOTE — Telephone Encounter (Signed)
Faxed DME request for nebulizer to [facility].Loralee Pacas [place]

## 2011-07-16 NOTE — Progress Notes (Signed)
  Subjective:    Patient ID: Holly Kim, female    DOB: 02-11-1941, 70 y.o.   MRN: [id number]  HPI 70 yo female with several concerns here for follow up: 1) chest/abdominal pain.  Was hospitalized for same and also seen last week at [facility].  Got a breathing treatment here and felt much better, so requests the same today.  Pain in chest, RUQ.  Pt feels it is likely her gallbladder.  Morphine helps a little bit.  Still with nausea, no vomiting.  +Constipation.  Worse with deep breath.  Had cardiac w/u in hospital.  Uses albuterol MDI at home, but this does not help.  +shortness of breath.  Denies fevers.  + sweats at night.  Feels cold all the time. 2) R foot, back and neck pain.  This is her chronic pain. She is being treated at the pain clinic for this.  Her morphine helps, but she would still like percocet.  Does have increased swelling in her foot, R>L. 3) Glucose ranges from 40 to 400 (did not bring her log today).  When low, drinks chocolate milk or eats cake.  Still having syringes filled by home health.   4)Having trouble with bills.  See social hx for more info.    Review of Systems  See HPI     Objective:   Physical Exam  Vitals reviewed. Constitutional: No distress.       Obese in motorized scooter. Initially speaking in short sentences and pressing her chest, then speaking in full sentences later and not in any distress as the visit progressed.  Cardiovascular: Exam reveals no gallop and no friction rub.   No murmur heard.      Distant sounds.  Pulmonary/Chest: Effort normal. She has no wheezes. She has rales. She exhibits tenderness.       + TTP Right of sternum.  Decreased air movement, c/w baseline hypoventilation.  Rales at bases B.  Abdominal: Soft. Bowel sounds are normal. She exhibits no distension and no mass. There is tenderness. There is guarding. There is no rebound.       Exam limited by body habitus.  No mass or HSM appreciated.  Musculoskeletal:       2+  edema RLE, 1+LLE to shin.   Very tender to even light touch over shins B. Healing abrasion on R great toe.  Psychiatric:       Disheveled appearing.  Initially speaking in short sentences and gasping for breath, but then able to speak in full sentences without distress as visit progressed.  Thought process and content normal and speech without FOI or LOA and non pressured.  Insight good.  Mood: somewhat depressed.  Affect: bright when discussing [name]'s ex girlfriend that they found on facebook.  Denies SI.          Assessment & Plan:

## 2011-07-16 NOTE — Assessment & Plan Note (Signed)
A 70 year old female with chief complaint of back pain.  Initial visit May 27, 2011.  Interval history of hospitalization to rule out MI.  This was on September 16, July 01, 2011.  Lumbar spine reviewed June 06, 2011, facet sclerosis L3-4, L4-5, L5-S1 noted. No other abnormalities.  Pain score 5/10.  Sleep poor.  Ability time steps no, does not drive.  Needs help with dressing, bathing, meal prep, household duties and shopping.  Positive review of systems, tremor, numbness, dizziness, depression, anxiety, nausea, constipation, abdominal pain, limb swelling, shortness of breath, blood sugar problems and skin breakdown.  Other physicians include neurologist as well as primary physician.  Social, widowed, lives with her son.  Examination; blood pressure 120/50, pulse 60, respirations 18 and O2 sat 99% on 3 liters nasal cannula.  Her back has tenderness in lumbosacral junction.  She has 4/5 strength bilateral lower extremity, negative straight leg raising test.  She transfers with supervision assist, needs to stabilize herself.  IMPRESSION: 1. Lumbar spondylosis. 2. Severe diabetic neuropathy. 3. 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 5. We will increase her MS Contin to 15 t.i.d.  Did not have any signs     of aberrant drug behavior.  No signs of over usage. 6. See in [facility] 1 month. 7. We discussed injections, however, she does not want to pursue this     option.     Erick Colace, M.D. Electronically Signed    AEK/MedQ D:  07/16/2011 11:48:21  T:  07/16/2011 13:10:52  Job #:  [id number]  cc:   Dr. Swaziland [facility]

## 2011-07-16 NOTE — Assessment & Plan Note (Signed)
Pt still with flucutations in her glucose, but good control per her A1C.  Will continue current regimen as pt feels she can control her hypoglycemia and does not want to change her dose.  Consider referral to Dr. Raymondo Band.  Pt would also like to meet with Dr. [name] again.

## 2011-07-16 NOTE — Assessment & Plan Note (Signed)
Pt losing weight without trying.  Does not desire extensive w/u.  (Declines mammograms, paps, colonoscopy).  Limited work up includes negative hemoccult, negative CXR and neg CT angiogram during recent hospitalization.  Could be due to food insecurity - social worker involved.

## 2011-07-16 NOTE — Assessment & Plan Note (Signed)
Pt with know CAD, but recent hospitalization with same pain was negative for MI and negative for PE.  Will follow.  Try home nebulizer.

## 2011-07-16 NOTE — Assessment & Plan Note (Addendum)
Pt with dyspnea, but symptoms seem to resolve during visit.  She also reports improvement after nebulilzer treatment.  Etiology likely multifactoral, including COPD, obesity hypoventilation syndrome, possibly psych component.  Records from hospitalizaiton reviewed, including stable CXR and negative CT angiogram (though limited by body habitus).  Will order a home health nebulizer as it greatly improved pt's symptoms.  Will also adjust torsemide to help with potential fluid overload.

## 2011-07-17 ENCOUNTER — Other Ambulatory Visit: Payer: Self-pay | Admitting: Family Medicine

## 2011-07-17 MED ORDER — ALBUTEROL SULFATE (2.5 MG/3ML) 0.083% IN NEBU: 2.5000 mg | INHALATION_SOLUTION | Freq: Four times a day (QID) | RESPIRATORY_TRACT | Status: DC | PRN

## 2011-07-17 NOTE — Telephone Encounter (Signed)
Spoke with patient's son and he is going to tell  Her and go pick it up

## 2011-07-17 NOTE — Telephone Encounter (Signed)
I just sent a prescription to her [facility] for the nebulized albuterol.  She should be able to pick it up from there.  If she prefers to get it from [facility], she can ask [facility] to call the [facility] and have it transferred.

## 2011-07-17 NOTE — Telephone Encounter (Signed)
Got the breathing machine yesterday from [facility], but does not have any medications to go in it.

## 2011-07-17 NOTE — Telephone Encounter (Signed)
Addended by: [place], SARAH T on: 07/17/2011 03:38 PM   Modules accepted: Orders

## 2011-07-21 IMAGING — CR DG CHEST 2V
1 series · 1 of 1 positions shown · non-contrast
Comparison: 07/06/2010

CLINICAL DATA: Shortness of breath

CHEST - 2 VIEW

[w chest lat]
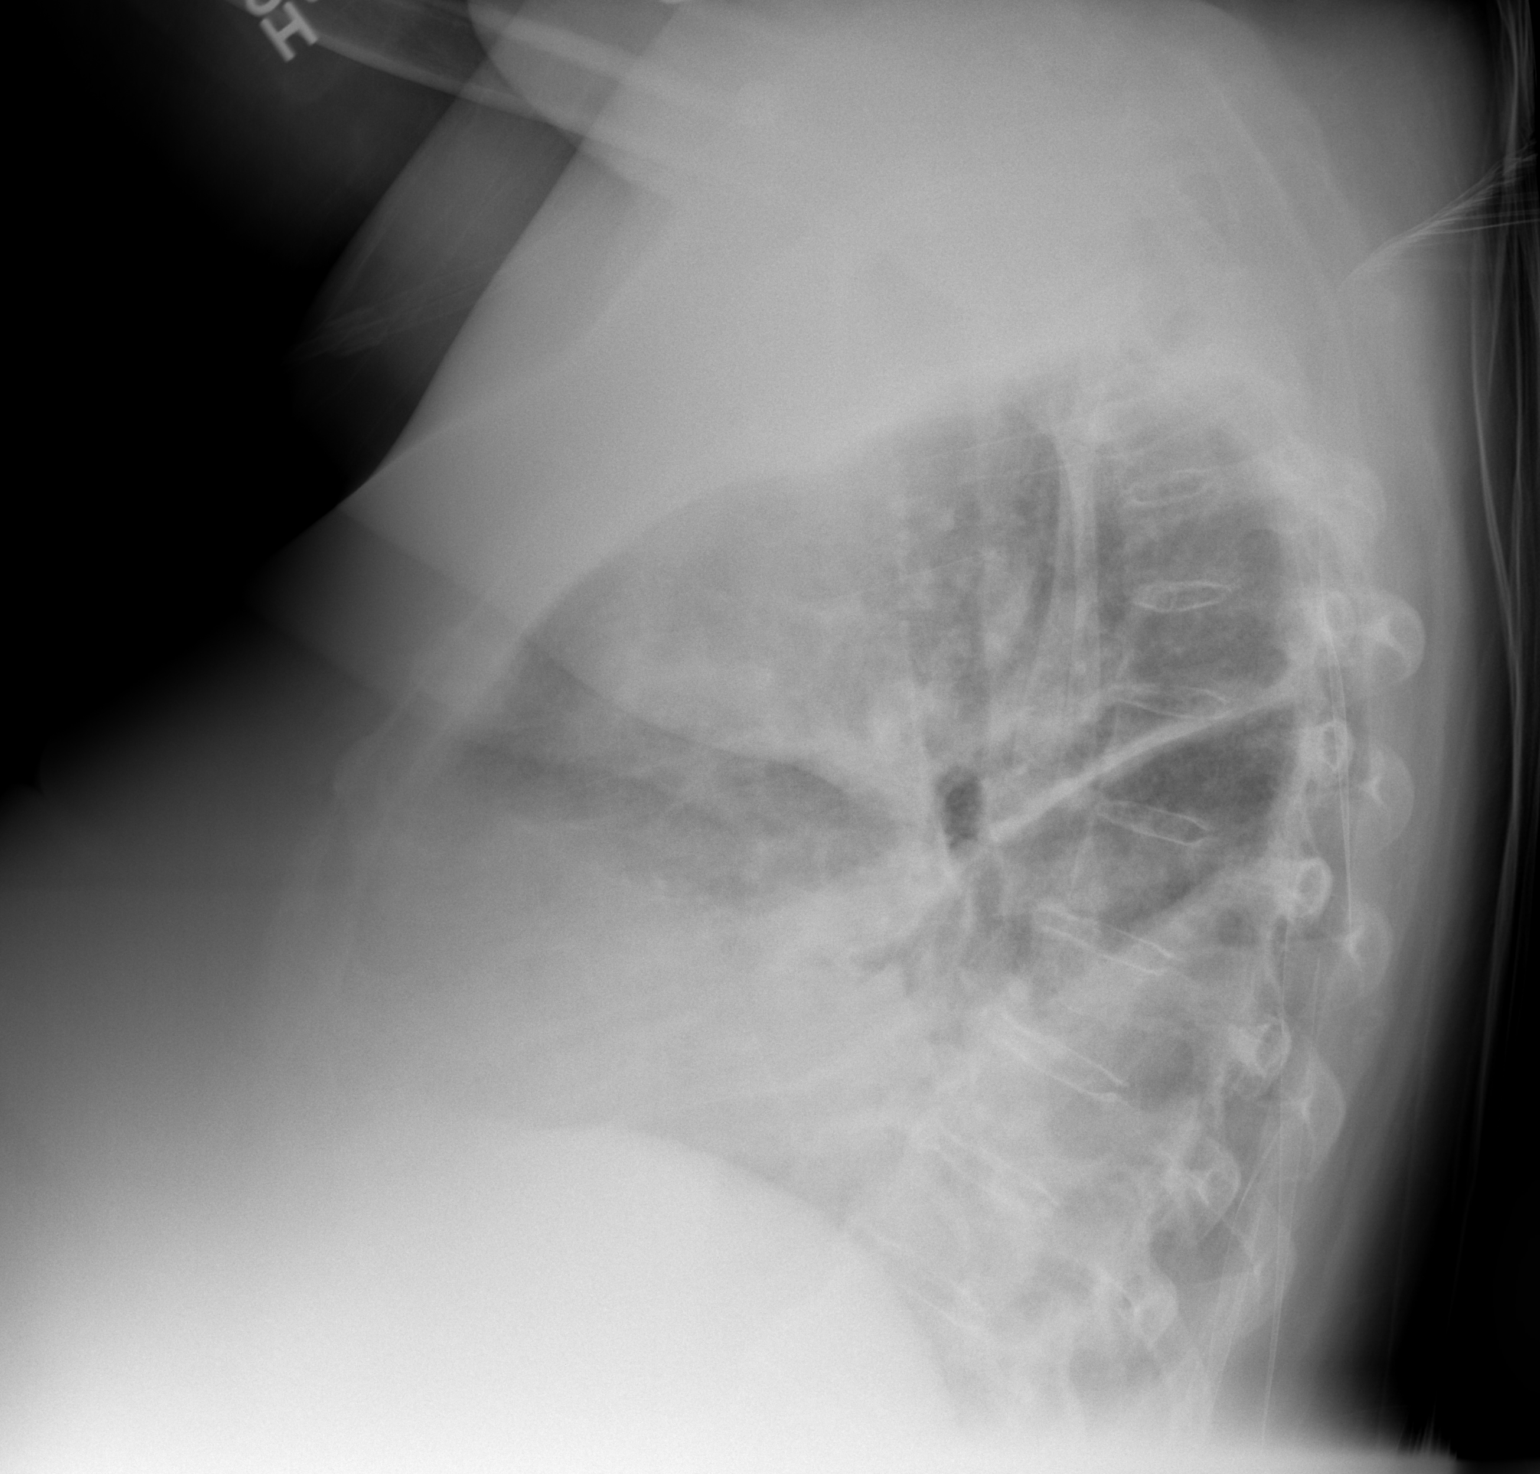

[1 of 1 positions shown; findings below may reference images not displayed]

FINDINGS: Enlargement of cardiac silhouette.
Pulmonary vascular congestion.
Minimal peribronchial thickening.
No gross pulmonary edema.
Left pleural effusion with atelectasis versus consolidation left
lower lobe.
Right lung grossly clear.
Examination limited by technique, secondary to body habitus.
IMPRESSION: Left pleural effusion with atelectasis versus consolidation left
lower lobe.

## 2011-07-22 ENCOUNTER — Telehealth: Payer: Self-pay | Admitting: Family Medicine

## 2011-07-22 NOTE — Telephone Encounter (Signed)
Spoke with pt.  She reports shortness of breath, unable to sleep in hospital bed because it is broken and won't raise up enough.  Feels breathing is getting worse.  Still with baseline chest pain.  States pain clinic increased her morphine to 3 pills a day, but does not feel this is worsening her shortness of breath.  Has nebulizer machine, which helped in clinic, but just got meds today and does not know how to use machine.  Pt has appt for PFTs with Dr. Raymondo Band on 10/12.  I offered double book on 10/11 with me, but she is seeing the foot doctor that day.  She is scheduled to see me 10/18.  If she feels acutely worse, she will call EMS and go to ER.  If she is gradually worsening, then she will call us and set up a next day appt (needs one day to arrange [facility] transport).   I spoke with [facility], who cannot get nursing support out to the home until after 5 pm today.  They will call Holly Kim and try to help her with the nebulizer over the phone, and if that is not possible, they will send someone after 5 pm.  I also ordered a BNP to be drawn by them.

## 2011-07-22 NOTE — Telephone Encounter (Signed)
She is having to sleep in her lift chair b/c her hosp bed won't raise high enough for her.  She can't breath so she has to sit up.  She has appt for PFT on 10/12. Wants to talk to Dr Swaziland

## 2011-07-24 ENCOUNTER — Other Ambulatory Visit: Payer: Self-pay | Admitting: Family Medicine

## 2011-07-24 NOTE — Telephone Encounter (Signed)
Refill request

## 2011-07-25 ENCOUNTER — Other Ambulatory Visit: Payer: Self-pay | Admitting: Family Medicine

## 2011-07-25 DIAGNOSIS — R0600 Dyspnea, unspecified: Secondary | ICD-10-CM

## 2011-07-25 MED ORDER — NYSTATIN 100000 UNIT/GM EX CREA: TOPICAL_CREAM | Freq: Two times a day (BID) | CUTANEOUS | Status: DC

## 2011-07-25 NOTE — Progress Notes (Signed)
Pt coming to pharmacy clinic 10/12.

## 2011-07-26 ENCOUNTER — Ambulatory Visit (INDEPENDENT_AMBULATORY_CARE_PROVIDER_SITE_OTHER): Payer: Medicare Other | Admitting: Pharmacist

## 2011-07-26 ENCOUNTER — Encounter: Payer: Self-pay | Admitting: Pharmacist

## 2011-07-26 VITALS — Ht 65.0 in | Wt 277.0 lb

## 2011-07-26 DIAGNOSIS — E1165 Type 2 diabetes mellitus with hyperglycemia: Secondary | ICD-10-CM

## 2011-07-26 DIAGNOSIS — J449 Chronic obstructive pulmonary disease, unspecified: Secondary | ICD-10-CM

## 2011-07-26 NOTE — Patient Instructions (Addendum)
Lung function test results show severe restriction. Dr. Swaziland will talk more about the results during your next visit. Take Lantus tomorrow in the morning between morning sugar check and breakfast. Start taking 55 units in the morning Take Novolog 16 units before breakfast, 20 units before lunch (if only eating salad for lunch, take 10 units), 24 units before dinner. If pre-meal blood sugar is between 70-100 take half the normal dose. Don't take any Novolog if your sugar is below a 70. Drink chocolate milk and recheck your sugar

## 2011-07-26 NOTE — Assessment & Plan Note (Signed)
Diabetes of long standing duration currently under poor control of blood glucose based on   Lab Results  Component Value Date   HGBA1C 6.5 05/16/2011    ,home fasting CBG readings of 60-150 and random CBG readings of 200-300. Control is suboptimal due to erratic adherence . Confirms hypoglycemic events, rarely during the night (around 2 times in the last year).  Able to verbalize appropriate hypoglycemia management plan. Decreased dose of basal insulin Lantus (insulin glargine) to 55 units to attempt a reduction of hypoglycemia episodes. Increased dose of rapid insulin Novolog (insulin aspart) to 16/20/24 units before meals. These dosage changes will not take place until the pt will have a new supply of pre-drawn syringes supplied by a home nurse on Wed.  Written patient instructions provided.  Follow up next week with Dr. Swaziland.  Pharmacist Clinic Visit PRN.   TTFFC 45 minutes.  Patient seen with Adrian Blackwater, PharmD Candidate.

## 2011-07-26 NOTE — Progress Notes (Signed)
  Subjective:    Patient ID: Holly Kim, female    DOB: 1941/06/13, 70 y.o.   MRN: [id number]  HPI Pt relies on a wheelchair for mobility for about 20 years. She comes to the clinic in good spirit. She complains of painful/burning breathing. Pt is on oxygen therapy 24/7. States that she has to sleep in some sort of a chair because she can't breath well if she lies down. Pt displays a good understanding of the disease. She knows when her cBG is low. Pt states that she takes her LA insulin at night.   Review of Systems     Objective:   Physical Exam Pt had problems with performing the spirometry testing due to coughing. See PFT results in Doc Flowsheets.       Assessment & Plan:   Diabetes of long standing duration currently under poor control of blood glucose based on   Lab Results  Component Value Date   HGBA1C 6.5 05/16/2011    ,home fasting CBG readings of 60-150 and random CBG readings of 200-300. Control is suboptimal due to erratic adherence . Confirms hypoglycemic events, rarely during the night (around 2 times in the last year).  Able to verbalize appropriate hypoglycemia management plan. Decreased dose of basal insulin Lantus (insulin glargine) to 55 units to attempt a reduction of hypoglycemia episodes. Increased dose of rapid insulin Novolog (insulin aspart) to 16/20/24 units before meals. These dosage changes will not take place until the pt will have a new supply of pre-drawn syringes supplied by a home nurse on Wed.  Written patient instructions provided.  Follow up next week with Dr. Swaziland.  Pharmacist Clinic Visit PRN.   TTFFC 45 minutes.  Patient seen with Adrian Blackwater, PharmD Candidate.   Spirometry evaluation reveals Severe restrictive lung disease.  Patient has been experiencing burning for a few weeks and taking Albuterol inhaler 2 puffs Q 6hr PRN for weezing and Albuterol nebulizer (2.5mg /44ml) Q 6hr PRN for weezing . no change treatment plan at this time.   Reviewed results of pulmonary function tests.  Pt verbalized understanding of results.  Written pt instructions provided.  F/U Clinic visit 3-4 weeks.   TTFFC 45 minutes.  Patient seen with Adrian Blackwater, PharmD Candidate.

## 2011-07-26 NOTE — Assessment & Plan Note (Signed)
Spirometry evaluation reveals Severe restrictive lung disease.  Patient has been experiencing burning for a few weeks and taking Albuterol inhaler 2 puffs Q 6hr PRN for weezing and Albuterol nebulizer (2.5mg /25ml) Q 6hr PRN for weezing . no change treatment plan at this time.  Reviewed results of pulmonary function tests.  Pt verbalized understanding of results.  Written pt instructions provided.  F/U Clinic visit 3-4 weeks.   TTFFC 45 minutes.  Patient seen with Adrian Blackwater, PharmD Candidate.

## 2011-07-29 ENCOUNTER — Encounter: Payer: Self-pay | Admitting: Family Medicine

## 2011-07-29 DIAGNOSIS — J984 Other disorders of lung: Secondary | ICD-10-CM | POA: Insufficient documentation

## 2011-07-29 NOTE — Progress Notes (Signed)
  Subjective:    Patient ID: Holly Kim, female    DOB: 1941/04/26, 69 y.o.   MRN: [id number]  HPI  Reviewed and agree with Dr. Macky Lower management.     Review of Systems     Objective:   Physical Exam        Assessment & Plan:

## 2011-08-01 ENCOUNTER — Ambulatory Visit (INDEPENDENT_AMBULATORY_CARE_PROVIDER_SITE_OTHER): Payer: Medicare Other | Admitting: Family Medicine

## 2011-08-01 ENCOUNTER — Encounter: Payer: Self-pay | Admitting: Family Medicine

## 2011-08-01 VITALS — BP 125/53 | HR 62 | Ht 65.0 in | Wt 285.6 lb

## 2011-08-01 DIAGNOSIS — Z23 Encounter for immunization: Secondary | ICD-10-CM

## 2011-08-01 DIAGNOSIS — E1165 Type 2 diabetes mellitus with hyperglycemia: Secondary | ICD-10-CM

## 2011-08-01 DIAGNOSIS — E118 Type 2 diabetes mellitus with unspecified complications: Secondary | ICD-10-CM

## 2011-08-01 DIAGNOSIS — J984 Other disorders of lung: Secondary | ICD-10-CM

## 2011-08-01 DIAGNOSIS — Z599 Problem related to housing and economic circumstances, unspecified: Secondary | ICD-10-CM

## 2011-08-04 DIAGNOSIS — Z599 Problem related to housing and economic circumstances, unspecified: Secondary | ICD-10-CM | POA: Insufficient documentation

## 2011-08-04 NOTE — Progress Notes (Signed)
  Subjective:    Patient ID: Holly Kim, female    DOB: [birth year], 70 y.o.   MRN: [id number]  HPI 70 yo here for follow up DM2- seen by Dr. Raymondo Band. Not optimal control, but seems to be managing given her highs and lows.  Has plan for hypoglycemic episodes.  Insulin adjusted per pharmacy, but just started new dose yesterday because it was first visit from RN, who fills syringes since her appt with Dr. Raymondo Band.  Glucose ranging 23 - 289.  Unsure what causes fluctuations.  Can check glucose in same finger 3 times and get readings such as 67/152/42.  Does feel symptomatic when low.  Financial troubles - she asks again for paperwork for [employer] to ask them to not cut lights off.  [employer] paid [employer] last month.  Theresia Kim is looking into resources for housing.  Pt want to switch pharmacies to [facility] for lower fees.  Bedsore on buttocks again.  +urinary incontinence and cannot change own pad at night.  Using ?Restore spray, which seems to help.  Has tried duoderm, but it did not help.     Review of SystemsStill SOB.  Still with same chest pain.     Objective:   Physical Exam  Nursing note and vitals reviewed. Constitutional:       Obese, in motorized scooter.  No distress.  Cardiovascular: Normal rate and regular rhythm.  Exam reveals no gallop and no friction rub.   No murmur heard.      Distant sounds  Pulmonary/Chest: Effort normal and breath sounds normal. No respiratory distress. She has no wheezes. She has no rales.       Very limited air movement, but lung fields are clear.  Skin: There is erythema.       Stage 1 decubitus ulcer R buttock.  Psychiatric:       Still with poor grooming.  Alert and cooperative.  Thought content focuses on housing situation.  No FOI, no LOA.  Nl speech pattern.  + depressed affect          Assessment & Plan:

## 2011-08-04 NOTE — Assessment & Plan Note (Signed)
Adjustments made by Dr. Raymondo Band.  Pt with safety plan in place for hypoglycemia.  Continue to follow.

## 2011-08-04 NOTE — Assessment & Plan Note (Signed)
Holly Kim, MSW researching housing options.  Application given to [name] today for [employer].  [employer] form filled out since pt needs electricity to run Research officer, trade [employer].

## 2011-08-04 NOTE — Assessment & Plan Note (Signed)
Evaluated with PFTs.  Likely due to morbid obesity.  Continue with O2

## 2011-08-05 ENCOUNTER — Telehealth: Payer: Self-pay | Admitting: Family Medicine

## 2011-08-05 DIAGNOSIS — J449 Chronic obstructive pulmonary disease, unspecified: Secondary | ICD-10-CM

## 2011-08-05 DIAGNOSIS — E1165 Type 2 diabetes mellitus with hyperglycemia: Secondary | ICD-10-CM

## 2011-08-05 DIAGNOSIS — E118 Type 2 diabetes mellitus with unspecified complications: Secondary | ICD-10-CM

## 2011-08-05 MED ORDER — "INSULIN SYRINGE-NEEDLE U-100 31G X 5/16"" 0.5 ML MISC": Status: DC

## 2011-08-05 MED ORDER — LISINOPRIL 5 MG PO TABS: 5.0000 mg | ORAL_TABLET | Freq: Every day | ORAL | Status: DC

## 2011-08-05 MED ORDER — CLOPIDOGREL BISULFATE 75 MG PO TABS: 75.0000 mg | ORAL_TABLET | Freq: Every day | ORAL | Status: DC

## 2011-08-05 MED ORDER — NYSTATIN 100000 UNIT/GM EX CREA: TOPICAL_CREAM | Freq: Two times a day (BID) | CUTANEOUS | Status: DC

## 2011-08-05 MED ORDER — ALBUTEROL SULFATE HFA 108 (90 BASE) MCG/ACT IN AERS: 2.0000 | INHALATION_SPRAY | Freq: Four times a day (QID) | RESPIRATORY_TRACT | Status: DC | PRN

## 2011-08-05 MED ORDER — NYSTATIN 100000 UNIT/GM EX POWD: Freq: Two times a day (BID) | CUTANEOUS | Status: DC

## 2011-08-05 MED ORDER — INSULIN ASPART 100 UNIT/ML ~~LOC~~ SOLN: SUBCUTANEOUS | Status: DC

## 2011-08-05 MED ORDER — FERROUS SULFATE 325 (65 FE) MG PO TABS: 325.0000 mg | ORAL_TABLET | Freq: Every day | ORAL | Status: DC

## 2011-08-05 MED ORDER — METOCLOPRAMIDE HCL 5 MG PO TABS: 5.0000 mg | ORAL_TABLET | Freq: Four times a day (QID) | ORAL | Status: DC

## 2011-08-05 MED ORDER — ALBUTEROL SULFATE (2.5 MG/3ML) 0.083% IN NEBU: 2.5000 mg | INHALATION_SOLUTION | Freq: Four times a day (QID) | RESPIRATORY_TRACT | Status: DC | PRN

## 2011-08-05 MED ORDER — SENIOR MULTIVITAMIN PLUS PO TABS: 1.0000 | ORAL_TABLET | Freq: Every day | ORAL | Status: DC

## 2011-08-05 MED ORDER — PROMETHAZINE HCL 25 MG PO TABS: 25.0000 mg | ORAL_TABLET | Freq: Four times a day (QID) | ORAL | Status: DC | PRN

## 2011-08-05 MED ORDER — ROSUVASTATIN CALCIUM 40 MG PO TABS: 40.0000 mg | ORAL_TABLET | Freq: Every day | ORAL | Status: DC

## 2011-08-05 MED ORDER — "INSULIN SYRINGE-NEEDLE U-100 31G X 5/16"" 1 ML MISC": Status: DC

## 2011-08-05 MED ORDER — TORSEMIDE 20 MG PO TABS: 20.0000 mg | ORAL_TABLET | Freq: Every day | ORAL | Status: DC

## 2011-08-05 MED ORDER — ASPIRIN 81 MG PO TBEC: 81.0000 mg | DELAYED_RELEASE_TABLET | Freq: Every day | ORAL | Status: DC

## 2011-08-05 MED ORDER — METOPROLOL TARTRATE 25 MG PO TABS: 25.0000 mg | ORAL_TABLET | Freq: Two times a day (BID) | ORAL | Status: DC

## 2011-08-05 MED ORDER — RANITIDINE HCL 150 MG PO CAPS: 150.0000 mg | ORAL_CAPSULE | Freq: Two times a day (BID) | ORAL | Status: DC

## 2011-08-05 MED ORDER — INSULIN GLARGINE 100 UNIT/ML ~~LOC~~ SOLN: 55.0000 [IU] | Freq: Every day | SUBCUTANEOUS | Status: DC

## 2011-08-05 MED ORDER — SUCRALFATE 1 G PO TABS: 1.0000 g | ORAL_TABLET | Freq: Three times a day (TID) | ORAL | Status: DC

## 2011-08-05 MED ORDER — DOCUSATE SODIUM 100 MG PO CAPS: 100.0000 mg | ORAL_CAPSULE | Freq: Two times a day (BID) | ORAL | Status: DC

## 2011-08-05 MED ORDER — SENNOSIDES 8.6 MG PO TABS: 3.0000 | ORAL_TABLET | Freq: Every day | ORAL | Status: DC

## 2011-08-05 NOTE — Telephone Encounter (Signed)
Ms. Holly Kim requests to transfer all rx to [facility].

## 2011-08-12 ENCOUNTER — Encounter: Payer: Self-pay | Admitting: Family Medicine

## 2011-08-12 ENCOUNTER — Ambulatory Visit (INDEPENDENT_AMBULATORY_CARE_PROVIDER_SITE_OTHER): Payer: Medicare Other | Admitting: Family Medicine

## 2011-08-12 VITALS — BP 130/51 | HR 64 | Wt 322.0 lb

## 2011-08-12 DIAGNOSIS — Z599 Problem related to housing and economic circumstances, unspecified: Secondary | ICD-10-CM

## 2011-08-12 DIAGNOSIS — R0600 Dyspnea, unspecified: Secondary | ICD-10-CM

## 2011-08-12 DIAGNOSIS — IMO0002 Reserved for concepts with insufficient information to code with codable children: Secondary | ICD-10-CM

## 2011-08-12 DIAGNOSIS — E118 Type 2 diabetes mellitus with unspecified complications: Secondary | ICD-10-CM

## 2011-08-12 DIAGNOSIS — R0989 Other specified symptoms and signs involving the circulatory and respiratory systems: Secondary | ICD-10-CM

## 2011-08-12 MED ORDER — ALBUTEROL SULFATE (2.5 MG/3ML) 0.083% IN NEBU
2.5000 mg | INHALATION_SOLUTION | Freq: Once | RESPIRATORY_TRACT | Status: AC
  Administered 2011-08-12: 2.5 mg via RESPIRATORY_TRACT

## 2011-08-12 MED ORDER — IPRATROPIUM BROMIDE 0.02 % IN SOLN
0.5000 mg | Freq: Once | RESPIRATORY_TRACT | Status: AC
  Administered 2011-08-12: 0.5 mg via RESPIRATORY_TRACT

## 2011-08-12 MED ORDER — NYSTATIN 100000 UNIT/GM EX CREA: TOPICAL_CREAM | Freq: Two times a day (BID) | CUTANEOUS | Status: DC

## 2011-08-12 MED ORDER — NYSTATIN 100000 UNIT/GM EX POWD: Freq: Two times a day (BID) | CUTANEOUS | Status: DC

## 2011-08-12 NOTE — Assessment & Plan Note (Signed)
Large fluctuations in control.  Unable to do a sliding scale because pt unable to fill own syringes.  Will continue current management.  Pt feels able to handle hypoglycemia. Advised pt that if she would be interested in PACE program or nursing home care, she would get better control of diabetes.  She might consider [facility] if [name] moves in with girlfriend.

## 2011-08-12 NOTE — Progress Notes (Signed)
  Subjective:    Patient ID: Holly Kim, female    DOB: [birth year], 70 y.o.   MRN: [id number]  HPI 70 yo with multiple medical problems. 1) Reports difficulty breathing.  +leg edema.  Unable to sleep lying flat.  Continues to have chest pain. Uses nebulizer machine with improvement.  + urination.  Taking 20 mg torsemide daily.   2)DM _ "sugars are all over the place."  Has insulin needles loaded by RN weekly.  If checks glucose and it is 100 or less, does not take novolog.  Uses Lantus in am.  Log reveals glucose 54-393.  Feels she is able to handle hypoglycemia episodes. 3)Social - Daughter, Zella Ball, is not dying, but does have esophageal and  Uterine CA, but being treated.  Son, [name], has reconnected with his ex fiancee, who plans to quit her job in [place] and move back to [place] to be with him.  Ms Mckimmy is happy about this.  Pt reports house is falling down around her.  Has mailed in application to housing that [name] found that might be a possiblity.  Pt also hoping to collect some money.  Reports her aide got $5000.00 from organization that only required her to send her ID and SSN to them, and she is now waiting for money that might be owed to her.    Review of SystemsSee HPI     Objective:   Physical Exam  Nursing note and vitals reviewed. Constitutional: She appears well-developed and well-nourished. No distress.       In Art gallery manager.  Dischevled appearing.  O2 in place.    Cardiovascular:       Distant sounds.  RRR.  No murmur appreciated.  Pulmonary/Chest: Effort normal and breath sounds normal. No respiratory distress. She has no wheezes. She has no rales.       Poor air movement, c/w restrictive disease.  No focal areas of decreased movement.   Initially speaking in short sentence when describing her shortness of breath, but became more animated as she began describing family situations.  Musculoskeletal: She exhibits edema.       1+ edema BLE    Skin: She is not diaphoretic.          Assessment & Plan:

## 2011-08-12 NOTE — Assessment & Plan Note (Signed)
Pt accessing services.  SW assisting.

## 2011-08-12 NOTE — Patient Instructions (Signed)
Please increase your torsemide to 20 mg two times a day. Please have your nurse check your blood pressure and let us know if it is less than 90/60.

## 2011-08-12 NOTE — Assessment & Plan Note (Signed)
Likely primarily from her restrictive disease, but given her edema and orthopnea, will increase her torsemide.  BP should be able to tolerate, but RN will check her BP at home and we can adjust if needed.

## 2011-08-14 ENCOUNTER — Telehealth: Payer: Self-pay | Admitting: Family Medicine

## 2011-08-14 NOTE — Telephone Encounter (Signed)
I took care of the senna and docusate and ranitidine (which is the same thing as Zantac) on 08/05/11.  She can have the pharmacy contact us directly if there is a problem and we can try to sort it out.  I do not have prilosec on her med list.  Was she getting that from [name]?

## 2011-08-14 NOTE — Telephone Encounter (Signed)
Holly Kim is need refill on Senna, Docusate, Prilosec, Zantac, Ranitdine HCL sent to her new Pharmacy, [facility].

## 2011-08-14 NOTE — Telephone Encounter (Signed)
Will forward to dr Swaziland

## 2011-08-15 ENCOUNTER — Ambulatory Visit: Payer: Medicare Other | Admitting: Family Medicine

## 2011-08-15 ENCOUNTER — Telehealth: Payer: Self-pay | Admitting: Clinical

## 2011-08-15 NOTE — Telephone Encounter (Signed)
Social Worker received referral to assist patient with finding affordable and suitable housing as patient is having a difficult time paying for her bills. Social Worker connected patient with income based housing, [employer] who has put pt. On a waiting list and expect an apartment to open up in December or January. Patient has recently expressed concern to Child psychotherapist that she has had rats come into her home therefore social worker decided to contact [facility] [employer] with concerns of pt. Current living situation. Today the Department of Social Services contacted social worker to inform her that they visited patient and will explore concerns and assist with resource finding.

## 2011-08-16 ENCOUNTER — Encounter: Payer: Medicare Other | Attending: Neurosurgery | Admitting: Neurosurgery

## 2011-08-16 DIAGNOSIS — E1149 Type 2 diabetes mellitus with other diabetic neurological complication: Secondary | ICD-10-CM | POA: Insufficient documentation

## 2011-08-16 DIAGNOSIS — M47817 Spondylosis without myelopathy or radiculopathy, lumbosacral region: Secondary | ICD-10-CM | POA: Insufficient documentation

## 2011-08-16 DIAGNOSIS — M545 Low back pain, unspecified: Secondary | ICD-10-CM | POA: Insufficient documentation

## 2011-08-16 DIAGNOSIS — M961 Postlaminectomy syndrome, not elsewhere classified: Secondary | ICD-10-CM

## 2011-08-16 DIAGNOSIS — E669 Obesity, unspecified: Secondary | ICD-10-CM | POA: Insufficient documentation

## 2011-08-16 DIAGNOSIS — M79609 Pain in unspecified limb: Secondary | ICD-10-CM | POA: Insufficient documentation

## 2011-08-16 DIAGNOSIS — E1142 Type 2 diabetes mellitus with diabetic polyneuropathy: Secondary | ICD-10-CM | POA: Insufficient documentation

## 2011-08-16 DIAGNOSIS — G609 Hereditary and idiopathic neuropathy, unspecified: Secondary | ICD-10-CM

## 2011-08-16 DIAGNOSIS — M12579 Traumatic arthropathy, unspecified ankle and foot: Secondary | ICD-10-CM | POA: Insufficient documentation

## 2011-08-17 NOTE — Assessment & Plan Note (Signed)
This is a patient of Dr. Wynn Banker', seen for some low back pain as well as right foot pain.  She reports her pain unchanged at 4-5.  It is intermittent, dull, stabbing-type pain.  General activity level is a 3. Pain is worse in the evening.  Sleep patterns are poor.  Pain is worse with activity.  Medication tends to help.  Mobility, she is independent. She does not drive, uses a wheelchair at times.  Functionally, she is on disability, needs help with ADLs, meal prep, and household duties.  REVIEW OF SYSTEMS:  Notable for difficulties described above as well as some weight gain, night sweats, blood sugar fluctuations, abdominal pain, limb swelling, shortness of breath, numbness, tremors, tingling, trouble walking, spasm, depression, anxiety.  No suicidal thoughts or aberrant behaviors.  Last count and UDS consistent.  PAST MEDICAL HISTORY:  Otherwise unchanged.  SOCIAL HISTORY:  Otherwise unchanged.  FAMILY HISTORY:  Otherwise unchanged.  Her Oswestry score is 78.  PHYSICAL EXAMINATION:  VITAL SIGNS:  Her blood pressure is 123/35, pulse 61, respirations 18, O2 sats 99 on 3 L nasal cannula. CONSTITUTIONAL:  She is obese.  She is alert and oriented x3.  She is in a wheelchair. NEUROLOGIC:  Sensation appears to be intact.  She has diminished strength in lower extremities.  IMPRESSION: 1. Lumbar spondylosis. 2. Diabetic neuropathy. 3. Cervical postlaminectomy syndrome. 4. 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  PLAN:  Refill MS Contin CR 15 mg 1 p.o. t.i.d., 90 with no refill.  Her questions were otherwise encouraged and answered.  We will see her back in a month.     Bryceton Hantz L. Blima Dessert Electronically Signed    RLW/MedQ D:  08/16/2011 12:52:18  T:  08/17/2011 01:47:18  Job #:  [id number]

## 2011-08-19 ENCOUNTER — Telehealth: Payer: Self-pay | Admitting: Family Medicine

## 2011-08-19 NOTE — Telephone Encounter (Signed)
Will fwd. To white team. .Holly Kim

## 2011-08-19 NOTE — Telephone Encounter (Signed)
Spoke with Holly Kim.  She reports her glucose ranges from 42 - 400.  She frequently skips her insulin (both Novolog and Lantus) for low sugars, and then her glucose is elevated.  Then she takes her insulin and has lows.  When low, she eats something and rechecks.  Takes her Lantus at different times of the day because if her glucose is low in the morning, she will skip the Lantus. The cell phone went dead during our conversation, and I called her back on her other line, which she said would cut off soon because she did not have many minutes on it. It sounds like Holly Kim is taking her insulin erratically, including taking her Lantus at different times of the day.  I am concerned that she is loading up on her insulin when her glucose is elevated, and then she has the resulting lows.  In her current situation, she is unable to adjust her insulin dose because her home health nurse fills her syringes q week for her. She is willing to try Holly Kim suggestion that she use the pen so she can adjust her sliding scale herself.  I will ask the front desk to schedule her with Holly Kim.  In the meantime, I asked her to hold her short acting insulin and use only the Lantus, and to be sure to use the lantus at the same time every day.  She agrees with this plan, and will call us with any concerns.

## 2011-08-19 NOTE — Telephone Encounter (Signed)
Patient asking to speak with Dr. [place] or Raymondo Band re: her blood sugar, it has been running up and down & pt doesn't even want to use her insulin because she cannot keep the numbers under control.

## 2011-08-26 ENCOUNTER — Ambulatory Visit: Payer: Medicare Other | Admitting: Family Medicine

## 2011-08-27 ENCOUNTER — Telehealth: Payer: Self-pay | Admitting: Clinical

## 2011-08-27 NOTE — Telephone Encounter (Signed)
Clinical Social Worker received a call from pt. Stating that she was not feeling well. Clinical Social Worker asked pt. If she contacted the clinic to schedule an appointment and she stated she has and has a scheduled appointment for August 29, 2011 at 11:30am. Pt. Also stated that since last Wednesday she has been having visual hallucinations and believes it may be related to her medication. Clinical Social Worker asked pt. If the people she is seeing talk to her (tell her to hurt herself/someone else) but pt. Stated they don't say anything and they leave once she blinks her eyes. Clinical Social Worker also asked pt. If anything in her daily activities or eating has changed since she began to see people but pt. Denied any changes. Clinical Social Worker encouraged pt. To continue blinking when she sees the people and to make sure to inform her doctor of what has been happening. Pt. Also expressed concern in her electricity being shut off. Clinical Social Worker expressed to pt. The concerns with her electricity and gas constantly being shut off monthly. Clinical Social Worker reminded pt. That she is able to go to a facility however pt. Stated her electricity was back on and that she would not want to go to a facility but will rather wait for the apartment she is on a waiting list for. Clinical Social Worker will follow-up after pt. Visit on Thursday. [name], MSW, Theresia Majors [phone]

## 2011-08-29 ENCOUNTER — Encounter: Payer: Self-pay | Admitting: Family Medicine

## 2011-08-29 ENCOUNTER — Ambulatory Visit (INDEPENDENT_AMBULATORY_CARE_PROVIDER_SITE_OTHER): Payer: Medicare Other | Admitting: Family Medicine

## 2011-08-29 VITALS — BP 117/52 | HR 55 | Temp 98.2°F | Wt 290.2 lb

## 2011-08-29 DIAGNOSIS — N9089 Other specified noninflammatory disorders of vulva and perineum: Secondary | ICD-10-CM

## 2011-08-29 DIAGNOSIS — E118 Type 2 diabetes mellitus with unspecified complications: Secondary | ICD-10-CM

## 2011-08-29 DIAGNOSIS — D649 Anemia, unspecified: Secondary | ICD-10-CM

## 2011-08-29 DIAGNOSIS — R231 Pallor: Secondary | ICD-10-CM

## 2011-08-29 DIAGNOSIS — E1165 Type 2 diabetes mellitus with hyperglycemia: Secondary | ICD-10-CM

## 2011-08-29 DIAGNOSIS — G2401 Drug induced subacute dyskinesia: Secondary | ICD-10-CM

## 2011-08-29 DIAGNOSIS — R0989 Other specified symptoms and signs involving the circulatory and respiratory systems: Secondary | ICD-10-CM

## 2011-08-29 DIAGNOSIS — Z599 Problem related to housing and economic circumstances, unspecified: Secondary | ICD-10-CM

## 2011-08-29 DIAGNOSIS — R0600 Dyspnea, unspecified: Secondary | ICD-10-CM

## 2011-08-29 LAB — COMPREHENSIVE METABOLIC PANEL
ALT: 18 U/L (ref 0–35)
AST: 15 U/L (ref 0–37)
Albumin: 3.4 g/dL — ABNORMAL LOW (ref 3.5–5.2)
CO2: 37 mEq/L — ABNORMAL HIGH (ref 19–32)
Calcium: 8.8 mg/dL (ref 8.4–10.5)
Chloride: 95 mEq/L — ABNORMAL LOW (ref 96–112)
Creat: 1.16 mg/dL — ABNORMAL HIGH (ref 0.50–1.10)
Potassium: 4.7 mEq/L (ref 3.5–5.3)
Total Protein: 6.5 g/dL (ref 6.0–8.3)

## 2011-08-29 LAB — POCT GLYCOSYLATED HEMOGLOBIN (HGB A1C): Hemoglobin A1C: 6.3

## 2011-08-29 LAB — CBC
HCT: 29 % — ABNORMAL LOW (ref 36.0–46.0)
MCHC: 27.6 g/dL — ABNORMAL LOW (ref 30.0–36.0)
Platelets: 149 10*3/uL — ABNORMAL LOW (ref 150–400)
RDW: 14.2 % (ref 11.5–15.5)
WBC: 7 10*3/uL (ref 4.0–10.5)

## 2011-08-29 MED ORDER — NYSTATIN 100000 UNIT/GM EX CREA: TOPICAL_CREAM | Freq: Two times a day (BID) | CUTANEOUS | Status: DC

## 2011-08-29 MED ORDER — ASPIRIN 81 MG PO TBEC: 81.0000 mg | DELAYED_RELEASE_TABLET | Freq: Every day | ORAL | Status: DC

## 2011-08-29 MED ORDER — RANITIDINE HCL 150 MG PO CAPS: 150.0000 mg | ORAL_CAPSULE | Freq: Two times a day (BID) | ORAL | Status: DC

## 2011-08-29 MED ORDER — OMEPRAZOLE 40 MG PO CPDR: 40.0000 mg | DELAYED_RELEASE_CAPSULE | Freq: Two times a day (BID) | ORAL | Status: DC

## 2011-08-29 MED ORDER — FERROUS SULFATE 325 (65 FE) MG PO TABS: 325.0000 mg | ORAL_TABLET | Freq: Every day | ORAL | Status: DC

## 2011-08-29 MED ORDER — INSULIN GLARGINE 100 UNIT/ML ~~LOC~~ SOLN: 55.0000 [IU] | Freq: Every day | SUBCUTANEOUS | Status: DC

## 2011-08-29 NOTE — Assessment & Plan Note (Signed)
Options were discussed today including returning for removal of a skin tag. It is a very difficult location. Ms. Biondo is unsure she'll be able to keep the area clean. We decided instead to refill her nystatin cream since if  she has a yeast vaginitis this will likely irritate her skin tag.

## 2011-08-29 NOTE — Assessment & Plan Note (Signed)
Patient with baseline tardive dyskinesia but today seems to have more jerking than her baseline tremor. Will check a CMP and a CBC today. Meds reviewed today.

## 2011-08-29 NOTE — Assessment & Plan Note (Signed)
Patient with improved glycemic control per her hemoglobin A1c. However still with fluctuations in her CBGs. Patient is to meet with Dr. Hildred Laser to discuss insulin pen. She may be able to better adjust these and give herself a sliding scale dose of insulin.

## 2011-08-29 NOTE — Assessment & Plan Note (Signed)
Adult protective services has evaluated according to the patient. Apparently they have no concerns. Patient is awaiting housing placement in an apartment. She reports that her son  has a girlfriend, moving down from up [place]. If they cannot all live together than Holly Kim might consider placement.

## 2011-08-29 NOTE — Assessment & Plan Note (Signed)
Given her worsening shortness of breath will check a CBC today to ensure that anemia is baseline

## 2011-08-29 NOTE — Patient Instructions (Signed)
We will check some blood tests today.  I will let you know if there are any problems with that. Please check an xray when you are able.  Please come back and see Dr. Raymondo Band for the insulin pens.

## 2011-08-29 NOTE — Telephone Encounter (Signed)
Will discuss with pt at visit today

## 2011-08-29 NOTE — Progress Notes (Signed)
  Subjective:    Patient ID: Holly Kim, female    DOB: 08/28/1941, 70 y.o.   MRN: [id number]  HPI 70 year old female here for followup. She has several concerns.  #1 she's been seeing things. She sees people. This can be a man, woman, or child. They're not people that she knows. She blinks and they  disappear. She is unsure if she has had hallucinations in the past. She has no auditory hallucinations. No suicidal ideation or homicidal ideation. She does have a history of hospitalization for psych reasons, but she is unsure what those are. She's had no change in her medicines. #2 she needs refills on the medicine called  Into new pharmacy  #3 she feels that she has knots in her  legs. This happens in both legs. Occurred a few days ago. Her legs are sore. More sore than usual . She also notes swelling in her leg worse than her baseline.  Also with dyspnea - states her O2 is now at 4L at home.  #4 she's been noticing jerks or shakes. She feels like she will have jerks when she gets around. She is worried she will fall. This has been going on a few weeks.  Unsure why she is having them.  In be worse when she is exerting herself  .for example she was holding her legs up to have her wheelchair locked into place and the shakes got worse. #5 she reports she is doing an insulin shot maybe once or twice a day. She is interested in getting insulin pens. Her glucoses have ranged from 95-247. Generally in the high 100s to low 200s.  #6 her mole  in her genital area has been bothering her. She is in need of some more nystatin cream for her vaginal area #7 she reports social services came out to evaluate. She states that they interviewed her and Holly Kim. And didn't find any problems.      Review of SystemsSee HPI     Objective:   Physical Exam  Constitutional: No distress.       Obese in electric scooter with O2 in place.  Cardiovascular: Normal rate, regular rhythm and normal heart sounds.  Exam  reveals no gallop and no friction rub.   No murmur heard.      Distant sounds.  Pulmonary/Chest: Effort normal. No respiratory distress. She has no wheezes. She has rales.       Poor airmovent.  +rales at bases.  No distress.  Able to speak in full sentences.  Genitourinary:       Limited external exam: Skin tag (0.5 cm) on R labia majora.  Exam very difficult due to body habitus.  No bleeding, no ulceration noted.  Musculoskeletal:       2+ edema RLE, 1+LLE. + scattered ecchymoses BLE. Diffusely tender BLE. + Varicosities noted, but no erythema, no warmth, no calf TTP or swelling.  Skin: She is not diaphoretic.          Assessment & Plan:

## 2011-08-29 NOTE — Assessment & Plan Note (Signed)
Lung exam today reveals more rales than noted previously. Will check chest x-ray when patient can return with transport.

## 2011-09-03 ENCOUNTER — Other Ambulatory Visit: Payer: Self-pay | Admitting: Family Medicine

## 2011-09-03 MED ORDER — OMEPRAZOLE 40 MG PO CPDR: 40.0000 mg | DELAYED_RELEASE_CAPSULE | Freq: Every day | ORAL | Status: DC

## 2011-09-10 ENCOUNTER — Emergency Department (HOSPITAL_COMMUNITY): Payer: Medicare Other

## 2011-09-10 ENCOUNTER — Encounter (HOSPITAL_COMMUNITY): Payer: Self-pay

## 2011-09-10 ENCOUNTER — Inpatient Hospital Stay (HOSPITAL_COMMUNITY)
Admission: EM | Admit: 2011-09-10 | Discharge: 2011-09-14 | DRG: 250 | Disposition: A | Discharge status: Home / Self Care | Payer: Medicare Other | Attending: Family Medicine | Admitting: Family Medicine

## 2011-09-10 ENCOUNTER — Other Ambulatory Visit: Payer: Self-pay

## 2011-09-10 DIAGNOSIS — D649 Anemia, unspecified: Secondary | ICD-10-CM | POA: Insufficient documentation

## 2011-09-10 DIAGNOSIS — G2401 Drug induced subacute dyskinesia: Secondary | ICD-10-CM | POA: Insufficient documentation

## 2011-09-10 DIAGNOSIS — F329 Major depressive disorder, single episode, unspecified: Secondary | ICD-10-CM | POA: Insufficient documentation

## 2011-09-10 DIAGNOSIS — F603 Borderline personality disorder: Secondary | ICD-10-CM | POA: Insufficient documentation

## 2011-09-10 DIAGNOSIS — J449 Chronic obstructive pulmonary disease, unspecified: Secondary | ICD-10-CM | POA: Insufficient documentation

## 2011-09-10 DIAGNOSIS — Z9981 Dependence on supplemental oxygen: Secondary | ICD-10-CM

## 2011-09-10 DIAGNOSIS — E662 Morbid (severe) obesity with alveolar hypoventilation: Secondary | ICD-10-CM | POA: Diagnosis present

## 2011-09-10 DIAGNOSIS — J984 Other disorders of lung: Secondary | ICD-10-CM | POA: Insufficient documentation

## 2011-09-10 DIAGNOSIS — I5189 Other ill-defined heart diseases: Secondary | ICD-10-CM | POA: Insufficient documentation

## 2011-09-10 DIAGNOSIS — J4489 Other specified chronic obstructive pulmonary disease: Secondary | ICD-10-CM | POA: Diagnosis present

## 2011-09-10 DIAGNOSIS — E7800 Pure hypercholesterolemia, unspecified: Secondary | ICD-10-CM | POA: Insufficient documentation

## 2011-09-10 DIAGNOSIS — E119 Type 2 diabetes mellitus without complications: Secondary | ICD-10-CM | POA: Diagnosis present

## 2011-09-10 DIAGNOSIS — F17200 Nicotine dependence, unspecified, uncomplicated: Secondary | ICD-10-CM | POA: Diagnosis present

## 2011-09-10 DIAGNOSIS — M549 Dorsalgia, unspecified: Secondary | ICD-10-CM | POA: Insufficient documentation

## 2011-09-10 DIAGNOSIS — I2510 Atherosclerotic heart disease of native coronary artery without angina pectoris: Secondary | ICD-10-CM | POA: Diagnosis present

## 2011-09-10 DIAGNOSIS — M959 Acquired deformity of musculoskeletal system, unspecified: Secondary | ICD-10-CM | POA: Insufficient documentation

## 2011-09-10 DIAGNOSIS — I872 Venous insufficiency (chronic) (peripheral): Secondary | ICD-10-CM | POA: Insufficient documentation

## 2011-09-10 DIAGNOSIS — Z86718 Personal history of other venous thrombosis and embolism: Secondary | ICD-10-CM

## 2011-09-10 DIAGNOSIS — R7989 Other specified abnormal findings of blood chemistry: Secondary | ICD-10-CM

## 2011-09-10 DIAGNOSIS — K3184 Gastroparesis: Secondary | ICD-10-CM | POA: Diagnosis present

## 2011-09-10 DIAGNOSIS — Y849 Medical procedure, unspecified as the cause of abnormal reaction of the patient, or of later complication, without mention of misadventure at the time of the procedure: Secondary | ICD-10-CM | POA: Diagnosis present

## 2011-09-10 DIAGNOSIS — I509 Heart failure, unspecified: Secondary | ICD-10-CM | POA: Diagnosis present

## 2011-09-10 DIAGNOSIS — F411 Generalized anxiety disorder: Secondary | ICD-10-CM | POA: Insufficient documentation

## 2011-09-10 DIAGNOSIS — K219 Gastro-esophageal reflux disease without esophagitis: Secondary | ICD-10-CM | POA: Insufficient documentation

## 2011-09-10 DIAGNOSIS — I214 Non-ST elevation (NSTEMI) myocardial infarction: Principal | ICD-10-CM | POA: Diagnosis present

## 2011-09-10 DIAGNOSIS — H543 Unqualified visual loss, both eyes: Secondary | ICD-10-CM | POA: Insufficient documentation

## 2011-09-10 DIAGNOSIS — I200 Unstable angina: Secondary | ICD-10-CM

## 2011-09-10 DIAGNOSIS — Z9861 Coronary angioplasty status: Secondary | ICD-10-CM

## 2011-09-10 DIAGNOSIS — T82897A Other specified complication of cardiac prosthetic devices, implants and grafts, initial encounter: Secondary | ICD-10-CM | POA: Diagnosis present

## 2011-09-10 DIAGNOSIS — I80299 Phlebitis and thrombophlebitis of other deep vessels of unspecified lower extremity: Secondary | ICD-10-CM | POA: Insufficient documentation

## 2011-09-10 DIAGNOSIS — I5033 Acute on chronic diastolic (congestive) heart failure: Secondary | ICD-10-CM | POA: Diagnosis present

## 2011-09-10 DIAGNOSIS — Z7982 Long term (current) use of aspirin: Secondary | ICD-10-CM

## 2011-09-10 DIAGNOSIS — E118 Type 2 diabetes mellitus with unspecified complications: Secondary | ICD-10-CM | POA: Insufficient documentation

## 2011-09-10 DIAGNOSIS — Z86711 Personal history of pulmonary embolism: Secondary | ICD-10-CM

## 2011-09-10 DIAGNOSIS — R079 Chest pain, unspecified: Secondary | ICD-10-CM

## 2011-09-10 DIAGNOSIS — G609 Hereditary and idiopathic neuropathy, unspecified: Secondary | ICD-10-CM | POA: Insufficient documentation

## 2011-09-10 HISTORY — DX: Drug induced subacute dyskinesia: G24.01

## 2011-09-10 HISTORY — DX: Chronic obstructive pulmonary disease, unspecified: J44.9

## 2011-09-10 HISTORY — DX: Depression, unspecified: F32.A

## 2011-09-10 HISTORY — DX: Borderline personality disorder: F60.3

## 2011-09-10 HISTORY — DX: Anxiety disorder, unspecified: F41.9

## 2011-09-10 HISTORY — DX: Personal history of other diseases of urinary system: Z87.448

## 2011-09-10 HISTORY — DX: Obesity, unspecified: E66.9

## 2011-09-10 HISTORY — DX: Atherosclerotic heart disease of native coronary artery without angina pectoris: I25.10

## 2011-09-10 HISTORY — DX: Gastro-esophageal reflux disease without esophagitis: K21.9

## 2011-09-10 HISTORY — DX: Major depressive disorder, single episode, unspecified: F32.9

## 2011-09-10 HISTORY — DX: Anemia, unspecified: D64.9

## 2011-09-10 LAB — CBC
MCH: 24 pg — ABNORMAL LOW (ref 26.0–34.0)
MCHC: 27.7 g/dL — ABNORMAL LOW (ref 30.0–36.0)
MCV: 86.6 fL (ref 78.0–100.0)
Platelets: 146 10*3/uL — ABNORMAL LOW (ref 150–400)

## 2011-09-10 LAB — BASIC METABOLIC PANEL
CO2: 37 mEq/L — ABNORMAL HIGH (ref 19–32)
Chloride: 96 mEq/L (ref 96–112)
Creatinine, Ser: 1.03 mg/dL (ref 0.50–1.10)
Glucose, Bld: 312 mg/dL — ABNORMAL HIGH (ref 70–99)
Sodium: 139 mEq/L (ref 135–145)

## 2011-09-10 LAB — URINALYSIS, ROUTINE W REFLEX MICROSCOPIC
Glucose, UA: 250 mg/dL — AB
Ketones, ur: 15 mg/dL — AB
Protein, ur: 30 mg/dL — AB

## 2011-09-10 LAB — BLOOD GAS, ARTERIAL
Patient temperature: 98.6
TCO2: 39.3 mmol/L (ref 0–100)
pH, Arterial: 7.346 — ABNORMAL LOW (ref 7.350–7.400)

## 2011-09-10 LAB — CARDIAC PANEL(CRET KIN+CKTOT+MB+TROPI)
CK, MB: 3.7 ng/mL (ref 0.3–4.0)
Relative Index: INVALID (ref 0.0–2.5)
Relative Index: INVALID (ref 0.0–2.5)
Total CK: 33 U/L (ref 7–177)
Troponin I: 0.74 ng/mL (ref <0.30)

## 2011-09-10 LAB — GLUCOSE, CAPILLARY
Glucose-Capillary: 319 mg/dL — ABNORMAL HIGH (ref 70–99)
Glucose-Capillary: 256 mg/dL — ABNORMAL HIGH (ref 70–99)

## 2011-09-10 LAB — URINE MICROSCOPIC-ADD ON

## 2011-09-10 LAB — PROTIME-INR: Prothrombin Time: 13.7 seconds (ref 11.6–15.2)

## 2011-09-10 LAB — DIFFERENTIAL
Basophils Absolute: 0 10*3/uL (ref 0.0–0.1)
Monocytes Relative: 3 % (ref 3–12)
Neutrophils Relative %: 89 % — ABNORMAL HIGH (ref 43–77)

## 2011-09-10 MED ORDER — METOCLOPRAMIDE HCL 5 MG PO TABS
5.0000 mg | ORAL_TABLET | Freq: Four times a day (QID) | ORAL | Status: DC
  Administered 2011-09-10 – 2011-09-14 (×13): 5 mg via ORAL
  Filled 2011-09-10 (×19): qty 1

## 2011-09-10 MED ORDER — ACETAMINOPHEN 650 MG RE SUPP: 650.0000 mg | Freq: Four times a day (QID) | RECTAL | Status: DC | PRN

## 2011-09-10 MED ORDER — INSULIN ASPART 100 UNIT/ML ~~LOC~~ SOLN
0.0000 [IU] | Freq: Three times a day (TID) | SUBCUTANEOUS | Status: DC
  Administered 2011-09-11: 8 [IU] via SUBCUTANEOUS
  Administered 2011-09-11: 5 [IU] via SUBCUTANEOUS
  Administered 2011-09-11 – 2011-09-12 (×2): 3 [IU] via SUBCUTANEOUS
  Administered 2011-09-12: 5 [IU] via SUBCUTANEOUS
  Administered 2011-09-12: 3 [IU] via SUBCUTANEOUS
  Administered 2011-09-13: 2 [IU] via SUBCUTANEOUS
  Administered 2011-09-13: 3 [IU] via SUBCUTANEOUS
  Administered 2011-09-14: 5 [IU] via SUBCUTANEOUS
  Administered 2011-09-14: 8 [IU] via SUBCUTANEOUS

## 2011-09-10 MED ORDER — MORPHINE SULFATE 2 MG/ML IJ SOLN
2.0000 mg | INTRAMUSCULAR | Status: DC | PRN
  Administered 2011-09-10 – 2011-09-11 (×3): 2 mg via INTRAVENOUS
  Filled 2011-09-10 (×3): qty 1

## 2011-09-10 MED ORDER — NYSTATIN 100000 UNIT/GM EX POWD
Freq: Two times a day (BID) | CUTANEOUS | Status: DC
  Administered 2011-09-10 – 2011-09-14 (×5): via TOPICAL
  Filled 2011-09-10 (×2): qty 15

## 2011-09-10 MED ORDER — SENNOSIDES 8.6 MG PO TABS: 3.0000 | ORAL_TABLET | Freq: Every day | ORAL | Status: DC

## 2011-09-10 MED ORDER — METOPROLOL TARTRATE 25 MG PO TABS
25.0000 mg | ORAL_TABLET | Freq: Two times a day (BID) | ORAL | Status: DC
  Administered 2011-09-10 – 2011-09-11 (×3): 25 mg via ORAL
  Filled 2011-09-10 (×6): qty 1

## 2011-09-10 MED ORDER — TORSEMIDE 20 MG PO TABS
20.0000 mg | ORAL_TABLET | Freq: Two times a day (BID) | ORAL | Status: DC
  Filled 2011-09-10 (×3): qty 1

## 2011-09-10 MED ORDER — ONDANSETRON HCL 4 MG/2ML IJ SOLN
4.0000 mg | Freq: Once | INTRAMUSCULAR | Status: AC
  Administered 2011-09-10: 4 mg via INTRAVENOUS
  Filled 2011-09-10: qty 2

## 2011-09-10 MED ORDER — LISINOPRIL 5 MG PO TABS
5.0000 mg | ORAL_TABLET | Freq: Every day | ORAL | Status: DC
  Administered 2011-09-11 – 2011-09-14 (×4): 5 mg via ORAL
  Filled 2011-09-10 (×5): qty 1

## 2011-09-10 MED ORDER — MORPHINE SULFATE 4 MG/ML IJ SOLN
4.0000 mg | Freq: Once | INTRAMUSCULAR | Status: AC
  Administered 2011-09-10: 4 mg via INTRAVENOUS
  Filled 2011-09-10: qty 1

## 2011-09-10 MED ORDER — NITROGLYCERIN 0.4 MG SL SUBL
0.4000 mg | SUBLINGUAL_TABLET | SUBLINGUAL | Status: AC | PRN
  Administered 2011-09-10 (×3): 0.4 mg via SUBLINGUAL
  Filled 2011-09-10 (×2): qty 25

## 2011-09-10 MED ORDER — SENIOR MULTIVITAMIN PLUS PO TABS: 1.0000 | ORAL_TABLET | Freq: Every day | ORAL | Status: DC

## 2011-09-10 MED ORDER — NITROGLYCERIN 0.4 MG SL SUBL: 0.4000 mg | SUBLINGUAL_TABLET | SUBLINGUAL | Status: DC | PRN

## 2011-09-10 MED ORDER — MORPHINE SULFATE 2 MG/ML IJ SOLN: 4.0000 mg | Freq: Once | INTRAMUSCULAR | Status: DC

## 2011-09-10 MED ORDER — ROSUVASTATIN CALCIUM 40 MG PO TABS
40.0000 mg | ORAL_TABLET | Freq: Every day | ORAL | Status: DC
  Filled 2011-09-10: qty 1

## 2011-09-10 MED ORDER — ALBUTEROL SULFATE (5 MG/ML) 0.5% IN NEBU: 2.5000 mg | INHALATION_SOLUTION | RESPIRATORY_TRACT | Status: DC | PRN

## 2011-09-10 MED ORDER — MORPHINE SULFATE 2 MG/ML IJ SOLN
INTRAMUSCULAR | Status: AC
  Filled 2011-09-10: qty 2

## 2011-09-10 MED ORDER — NITROGLYCERIN 2 % TD OINT
0.5000 [in_us] | TOPICAL_OINTMENT | Freq: Four times a day (QID) | TRANSDERMAL | Status: DC
  Administered 2011-09-10 (×2): 0.5 [in_us] via TOPICAL
  Filled 2011-09-10 (×3): qty 30

## 2011-09-10 MED ORDER — THERA M PLUS PO TABS
1.0000 | ORAL_TABLET | Freq: Every day | ORAL | Status: DC
  Administered 2011-09-11 – 2011-09-14 (×4): 1 via ORAL
  Filled 2011-09-10 (×4): qty 1

## 2011-09-10 MED ORDER — MORPHINE SULFATE 4 MG/ML IJ SOLN
4.0000 mg | Freq: Once | INTRAMUSCULAR | Status: AC
  Administered 2011-09-10: 4 mg via INTRAVENOUS

## 2011-09-10 MED ORDER — HEPARIN SODIUM (PORCINE) 5000 UNIT/ML IJ SOLN: 5000.0000 [IU] | Freq: Three times a day (TID) | INTRAMUSCULAR | Status: DC

## 2011-09-10 MED ORDER — HEPARIN BOLUS VIA INFUSION
4000.0000 [IU] | Freq: Once | INTRAVENOUS | Status: AC
  Administered 2011-09-10: 4000 [IU] via INTRAVENOUS
  Filled 2011-09-10: qty 4000

## 2011-09-10 MED ORDER — INSULIN GLARGINE 100 UNIT/ML ~~LOC~~ SOLN
30.0000 [IU] | SUBCUTANEOUS | Status: DC
  Administered 2011-09-11 – 2011-09-14 (×4): 30 [IU] via SUBCUTANEOUS
  Filled 2011-09-10: qty 3

## 2011-09-10 MED ORDER — FERROUS SULFATE 325 (65 FE) MG PO TABS
325.0000 mg | ORAL_TABLET | Freq: Every day | ORAL | Status: DC
  Administered 2011-09-11 – 2011-09-14 (×4): 325 mg via ORAL
  Filled 2011-09-10 (×5): qty 1

## 2011-09-10 MED ORDER — DOCUSATE SODIUM 100 MG PO CAPS
100.0000 mg | ORAL_CAPSULE | Freq: Two times a day (BID) | ORAL | Status: DC
  Administered 2011-09-11 – 2011-09-14 (×7): 100 mg via ORAL
  Filled 2011-09-10 (×10): qty 1

## 2011-09-10 MED ORDER — ASPIRIN 81 MG PO TBEC: 81.0000 mg | DELAYED_RELEASE_TABLET | Freq: Every day | ORAL | Status: DC

## 2011-09-10 MED ORDER — ONDANSETRON HCL 4 MG PO TABS: 4.0000 mg | ORAL_TABLET | Freq: Four times a day (QID) | ORAL | Status: DC | PRN

## 2011-09-10 MED ORDER — ONDANSETRON HCL 4 MG/2ML IJ SOLN: 4.0000 mg | Freq: Four times a day (QID) | INTRAMUSCULAR | Status: DC | PRN

## 2011-09-10 MED ORDER — ASPIRIN 81 MG PO CHEW
324.0000 mg | CHEWABLE_TABLET | Freq: Once | ORAL | Status: AC
  Administered 2011-09-10: 324 mg via ORAL
  Filled 2011-09-10: qty 4

## 2011-09-10 MED ORDER — HEPARIN SOD (PORCINE) IN D5W 100 UNIT/ML IV SOLN
1000.0000 [IU]/h | INTRAVENOUS | Status: DC
  Administered 2011-09-10: 1000 [IU]/h via INTRAVENOUS
  Filled 2011-09-10 (×2): qty 250

## 2011-09-10 MED ORDER — CLOPIDOGREL BISULFATE 75 MG PO TABS
75.0000 mg | ORAL_TABLET | Freq: Every day | ORAL | Status: DC
  Administered 2011-09-11 – 2011-09-14 (×4): 75 mg via ORAL
  Filled 2011-09-10 (×4): qty 1

## 2011-09-10 MED ORDER — PANTOPRAZOLE SODIUM 40 MG PO TBEC
40.0000 mg | DELAYED_RELEASE_TABLET | Freq: Every day | ORAL | Status: DC
  Administered 2011-09-10 – 2011-09-14 (×5): 40 mg via ORAL
  Filled 2011-09-10 (×5): qty 1

## 2011-09-10 MED ORDER — SUCRALFATE 1 G PO TABS
1.0000 g | ORAL_TABLET | Freq: Three times a day (TID) | ORAL | Status: DC
  Administered 2011-09-11 – 2011-09-14 (×10): 1 g via ORAL
  Filled 2011-09-10 (×14): qty 1

## 2011-09-10 MED ORDER — INSULIN ASPART 100 UNIT/ML ~~LOC~~ SOLN
5.0000 [IU] | Freq: Three times a day (TID) | SUBCUTANEOUS | Status: DC
  Administered 2011-09-11 – 2011-09-14 (×9): 5 [IU] via SUBCUTANEOUS
  Filled 2011-09-10: qty 3

## 2011-09-10 MED ORDER — ACETAMINOPHEN 325 MG PO TABS
650.0000 mg | ORAL_TABLET | Freq: Four times a day (QID) | ORAL | Status: DC | PRN
  Administered 2011-09-11: 650 mg via ORAL
  Filled 2011-09-10: qty 1

## 2011-09-10 MED ORDER — MORPHINE SULFATE 4 MG/ML IJ SOLN
4.0000 mg | Freq: Once | INTRAMUSCULAR | Status: DC
  Filled 2011-09-10: qty 1

## 2011-09-10 MED ORDER — NYSTATIN 100000 UNIT/GM EX CREA
1.0000 "application " | TOPICAL_CREAM | Freq: Two times a day (BID) | CUTANEOUS | Status: DC
  Administered 2011-09-10 – 2011-09-13 (×4): 1 via TOPICAL
  Filled 2011-09-10 (×2): qty 15

## 2011-09-10 MED ORDER — SENNA 8.6 MG PO TABS
3.0000 | ORAL_TABLET | Freq: Every day | ORAL | Status: DC
  Administered 2011-09-11 – 2011-09-14 (×4): 25.8 mg via ORAL
  Filled 2011-09-10 (×4): qty 3

## 2011-09-10 MED ORDER — NYSTATIN 100000 UNIT/GM EX POWD: Freq: Two times a day (BID) | CUTANEOUS | Status: DC

## 2011-09-10 MED ORDER — ASPIRIN EC 81 MG PO TBEC
81.0000 mg | DELAYED_RELEASE_TABLET | Freq: Every day | ORAL | Status: DC
  Filled 2011-09-10: qty 1

## 2011-09-10 NOTE — Progress Notes (Signed)
ANTICOAGULATION CONSULT NOTE - Initial Consult  Pharmacy Consult for Heparin Indication: ACS/STEMI  Allergies  Allergen Reactions  . Ibuprofen     REACTION: "mouth swells up and I can't swallow good"  . Amlodipine Besylate     REACTION: hypotension  . Amoxicillin     REACTION: itching  . Codeine Itching  . Cortisone Other (See Comments)    Pt states she gets "blood poisoning where they give me the shots of cortisone"  . Haloperidol Lactate     REACTION: My head gets places before my body  . Naproxen     REACTION: rash    Patient Measurements:   (per patient report in ED: ht=5'5", wt= 134.5kg)  Vital Signs: Temp: 98.7 F (37.1 C) (11/27 1205) Temp src: Oral (11/27 1205) BP: 139/53 mmHg (11/27 1205) Pulse Rate: 83  (11/27 1205)  Labs:  Basename 09/10/11 1215 09/10/11 0837  HGB -- 8.6*  HCT -- 31.0*  PLT -- 146*  APTT -- --  LABPROT -- 13.7  INR -- 1.03  HEPARINUNFRC -- --  CREATININE -- 1.03  CKTOTAL 48 33  CKMB 6.3* 3.7  TROPONINI 0.74* 0.37*   The CrCl is unknown because both a height and weight (above a minimum accepted value) are required for this calculation.  Medical History: Past Medical History  Diagnosis Date  . PE (pulmonary embolism)   . DVT (deep venous thrombosis)   . Diabetes mellitus   . Asthma   . Hypertension   . CAD (coronary artery disease)     s/p pci rca in past; nstemi 12/2009 with rca occlusion/isr - 2.75 x 18-mm PROMUS DES placed.  residual ostial lcx dzs  . GERD (gastroesophageal reflux disease)   . COPD (chronic obstructive pulmonary disease)   . Obesity   . Tardive dyskinesia   . History of renal failure   . Depression   . Borderline personality disorder   . Anxiety   . Anemia     hx of blood transfusion x 3    Medications:  Scheduled:    . aspirin  324 mg Oral Once  . morphine      .  morphine injection  4 mg Intravenous Once  .  morphine injection  4 mg Intravenous Once  . nitroGLYCERIN  0.5 inch Topical Q6H  .  ondansetron  4 mg Intravenous Once   Infusions:   PRN: nitroGLYCERIN  Assessment: 70 yo F with possible ACS. To start heparin drip per RX. All baseline coags wnl (plts=146).   Goal of Therapy:  Heparin level 0.3-0.7 units/ml   Plan:  1)  Heparin bolus 4000 units IV x1 2)  Heparin drip 1000 units/hr (48ml/hr) 3)  Heparin level in 8 hours after drip started. 4)  Daily heparin level and CBC  Thomasena Edis, Cathlean Cower 09/10/2011,4:52 PM

## 2011-09-10 NOTE — ED Notes (Signed)
CSW was consulted to meet with pt. Pt explained that she has been working with CSW Reatha Harps. from [facility] on housing needs. CSW explained that Nelva Bush was contacted and explained that pt is still on the wait list at the apartment complex previously located. Pt is currently being transferred to [facility].  Nelva Bush has been notified via voice mail message of the pt's transfer. [facility] CSW to follow up with pt regarding any further needs.

## 2011-09-10 NOTE — ED Provider Notes (Signed)
History     CSN: [id number] Arrival date & time: 09/10/2011  6:13 AM   First MD Initiated Contact with Patient 09/10/11 [phone]      Chief Complaint  Patient presents with  . Shortness of Breath    (Consider location/radiation/quality/duration/timing/severity/associated sxs/prior treatment) HPI  Patient with significant medical history of coronary artery disease, myocardial infarction, COPD  oxygen dependent at home at 4 L constantly, morbid obesity, history of PE, and DMII presents to emergency department complaining of gradual onset right chest pain that began yesterday and throughout the night and this morning increasing left-sided chest pain with patient stating that it hurts to take a deep breath and that she feels short of breath. Left sided CP began a couple of hours PTA and has been constant since onset. Also complaining of a gradual onset of a numbness or tingling sensation radiating into left arm but then goes on to say also into right arm to "some degree."  Patient states that she has chronic daily pain in on a daily basis has some degree of pain with breathing however states greater left-sided chest pain than usual. Patient denies taking anything for pain prior to arrival however states she takes daily morphine for chronic pain and has not had any pain medicine today. Denies aspirin or nitroglycerin use problems to arrival. Patient was brought to emergency room by EMS. States pain in chest is aggravated by deep inspiration and by touch however denies alleviating factors. Patient states symptoms are similar to pain she had in the past with COPD exacerbation and also prior to past MI. She denies any fevers, chills, abdominal pain, nausea, vomiting, diarrhea, productive cough, hemoptysis.  Past Medical History  Diagnosis Date  . NSTEMI (non-ST elevated myocardial infarction) 06/2010  . PE (pulmonary embolism)   . DVT (deep venous thrombosis)   . Diabetes mellitus   . Asthma   .  Hypertension     Past Surgical History  Procedure Date  . US echocardiography 2008  . Orif ankle fracture   . Appendectomy   . Cervical discectomy   . Ptca 06/2010  . Tonsillectomy   . Transthoracic echocardiogram 06/2010    Grade 1 diastolic, EF 55-60    Family History  Problem Relation Age of Onset  . Breast cancer      aunt  . Alcohol abuse Son   . Cancer Daughter     History  Substance Use Topics  . Smoking status: Passive Smoker  . Smokeless tobacco: Never Used  . Alcohol Use: No    OB History    Grav Para Term Preterm Abortions TAB SAB Ect Mult Living                  Review of Systems  All other systems reviewed and are negative.    Allergies  Ibuprofen; Amlodipine besylate; Amoxicillin; Codeine; Haloperidol lactate; and Naproxen  Home Medications   Current Outpatient Rx  Name Route Sig Dispense Refill  . ALBUTEROL SULFATE (2.5 MG/3ML) 0.083% IN NEBU Nebulization Take 3 mLs (2.5 mg total) by nebulization every 6 (six) hours as needed for wheezing. Do not use with albuterol inhaler. 75 mL 1  . ALBUTEROL SULFATE HFA 108 (90 BASE) MCG/ACT IN AERS Inhalation Inhale 2 puffs into the lungs every 6 (six) hours as needed for wheezing. 1 Inhaler 2  . ASPIRIN 81 MG PO TBEC Oral Take 1 tablet (81 mg total) by mouth daily. 100 tablet 0  . CLOPIDOGREL BISULFATE 75 MG PO  TABS Oral Take 1 tablet (75 mg total) by mouth daily. 90 tablet 0    PLEASE RESPOND THANKS!  . DOCUSATE SODIUM 100 MG PO CAPS Oral Take 1 capsule (100 mg total) by mouth 2 (two) times daily. 100 capsule 1  . FERROUS SULFATE 325 (65 FE) MG PO TABS Oral Take 1 tablet (325 mg total) by mouth daily. 90 tablet 2  . INSULIN ASPART 100 UNIT/ML [place] SOLN  16 units prior to breakfast, 20 units prior to lunch, 24 prior units to dinner 20 mL 2    Please dispense 1 month supply  . INSULIN GLARGINE 100 UNIT/ML [place] SOLN Subcutaneous Inject 55 Units into the skin daily. 20 mL 2    Please dispense 1 month supply  .  LISINOPRIL 5 MG PO TABS Oral Take 1 tablet (5 mg total) by mouth daily. 90 tablet 0  . METOCLOPRAMIDE HCL 5 MG PO TABS Oral Take 1 tablet (5 mg total) by mouth 4 (four) times daily. 120 tablet 2  . METOPROLOL TARTRATE 25 MG PO TABS Oral Take 1 tablet (25 mg total) by mouth 2 (two) times daily. 120 tablet 1  . MORPHINE SULFATE ER 15 MG PO TB12 Oral Take 15 mg by mouth 3 (three) times daily.     Gillermina Hu MULTIVITAMIN PLUS PO TABS Oral Take 1 tablet by mouth daily. 100 tablet 1  . NYSTATIN 100000 UNIT/GM EX POWD Topical Apply topically 2 (two) times daily. Apply to affected area  15 g 3  . NYSTATIN 100000 UNIT/GM EX CREA Topical Apply topically 2 (two) times daily. 120 g 3  . OMEPRAZOLE 40 MG PO CPDR Oral Take 1 capsule (40 mg total) by mouth daily. 1 capsule by mouth  BID. 120 capsule 2  . PROMETHAZINE HCL 25 MG PO TABS Oral Take 1 tablet (25 mg total) by mouth every 6 (six) hours as needed for nausea. 120 tablet 3  . RANITIDINE HCL 150 MG PO CAPS Oral Take 1 capsule (150 mg total) by mouth 2 (two) times daily. 120 capsule 2  . ROSUVASTATIN CALCIUM 40 MG PO TABS Oral Take 1 tablet (40 mg total) by mouth daily. 31 tablet 6    PLEASE RESPOND  . SENNOSIDES 8.6 MG PO TABS Oral Take 3 tablets by mouth daily. 180 tablet 3  . SUCRALFATE 1 G PO TABS Oral Take 1 tablet (1 g total) by mouth 3 (three) times daily before meals. 180 tablet 2  . TORSEMIDE 20 MG PO TABS Oral Take 1 tablet (20 mg total) by mouth daily. 60 tablet 1    PLEASE RESPOND THANKS!    BP 165/55  Pulse 90  Temp(Src) 98.4 F (36.9 C) (Oral)  Resp 22  SpO2 98%  Physical Exam  Nursing note and vitals reviewed. Constitutional: She is oriented to person, place, and time. She appears well-developed. No distress.       Morbidly obese  HENT:  Head: Normocephalic and atraumatic.  Eyes: Conjunctivae and EOM are normal. Pupils are equal, round, and reactive to light.  Neck: Normal range of motion. Neck supple.  Cardiovascular: Normal  rate, regular rhythm, normal heart sounds and intact distal pulses.  Exam reveals no gallop and no friction rub.   No murmur heard. Pulmonary/Chest: Effort normal and breath sounds normal. No respiratory distress. She exhibits tenderness.       Breath sounds difficult to auscultate due to body habitus. Mildly tachypnea. Tenderness to palpation of the entire chest wall, moderate. No crepitus or  skin changes. Tenderness in chest wall with full range of motion of bilateral upper extremities.  Abdominal: Soft. Bowel sounds are normal. She exhibits no distension and no mass. There is no tenderness. There is no rebound and no guarding.       Morbidly obese abdomen.  Musculoskeletal: Normal range of motion. She exhibits edema. She exhibits no tenderness.       1+ pitting edema bilateral lower extremities with mild tenderness to palpation but no erythema or skin changes. Full range of motion of bilateral upper chimneys with pain into anterior chest wall with range of motion. Radial pulses equal bilaterally. Normal sensation of bilateral upper extremities.  Neurological: She is alert and oriented to person, place, and time.  Skin: Skin is warm and dry. No rash noted. She is not diaphoretic. No erythema.  Psychiatric: She has a normal mood and affect.    ED Course  Procedures (including critical care time)  IV morphine and Zofran, saline lock. By mouth aspirin and nitroglycerin  Patient unable to get self out of bed to use the bathroom and therefore Foley catheter will be placed. Unable to place patient on bedpan due to body habitus.  Nitro paste.    Date: 09/10/2011  Rate: 88  Rhythm: normal sinus rhythm  QRS Axis: normal  Intervals: normal  ST/T Wave abnormalities: normal  Conduction Disutrbances:right bundle branch block, LAFB  Narrative Interpretation: abnormal but unchanged  Old EKG Reviewed: no significant changes     Labs Reviewed  CBC - Abnormal; Notable for the following:    WBC  11.4 (*)    RBC 3.58 (*)    Hemoglobin 8.6 (*)    HCT 31.0 (*)    MCH 24.0 (*)    MCHC 27.7 (*)    Platelets 146 (*)    All other components within normal limits  DIFFERENTIAL - Abnormal; Notable for the following:    Neutrophils Relative 89 (*)    Neutro Abs 10.1 (*)    Lymphocytes Relative 8 (*)    All other components within normal limits  BASIC METABOLIC PANEL - Abnormal; Notable for the following:    CO2 37 (*)    Glucose, Bld 312 (*)    GFR calc non Af Amer 53 (*)    GFR calc Af Amer 61 (*)    All other components within normal limits  CARDIAC PANEL(CRET KIN+CKTOT+MB+TROPI) - Abnormal; Notable for the following:    Troponin I 0.37 (*)    All other components within normal limits  PRO B NATRIURETIC PEPTIDE - Abnormal; Notable for the following:    BNP, POC 1603.0 (*)    All other components within normal limits  URINALYSIS, ROUTINE W REFLEX MICROSCOPIC - Abnormal; Notable for the following:    Appearance CLOUDY (*)    Glucose, UA 250 (*)    Hgb urine dipstick LARGE (*)    Ketones, ur 15 (*)    Protein, ur 30 (*)    All other components within normal limits  GLUCOSE, CAPILLARY - Abnormal; Notable for the following:    Glucose-Capillary 319 (*)    All other components within normal limits  URINE MICROSCOPIC-ADD ON - Abnormal; Notable for the following:    Squamous Epithelial / LPF FEW (*)    Bacteria, UA FEW (*)    Casts GRANULAR CAST (*)    All other components within normal limits  PROTIME-INR  POCT CBG MONITORING  URINE CULTURE   No results found.   1. NSTEMI (non-ST  elevated myocardial infarction)   2. COPD (chronic obstructive pulmonary disease)   3. Congestive heart failure   4. Diabetes mellitus     11:36 AM Nicki Guadalajara with [facility] returned consult and will see patient in ER for admission for NSTEMI. VSS, afebrile, NAD. Patient states CP improving.   CRITICAL CARE Performed by: Jenness Corner   Total critical care time: 85  minutes  Critical care time was exclusive of separately billable procedures and treating other patients.  Critical care was necessary to treat or prevent imminent or life-threatening deterioration.  Critical care was time spent personally by me on the following activities: development of treatment plan with patient and/or surrogate as well as nursing, discussions with consultants, evaluation of patient's response to treatment, examination of patient, obtaining history from patient or surrogate, ordering and performing treatments and interventions, ordering and review of laboratory studies, ordering and review of radiographic studies, pulse oximetry and re-evaluation of patient's condition.     MDM  NSTEMI but CP improving and no acute findings on EKG with CHF exacerbation. Cardiology to consult for admission.   Cardiology, Nasher, has evaluated patient and written consult orders/recommendations but request an internal medicine admission given patient's large number of comorbidities. Will contact [facility] for transfer for admission to [facility]  4:17 PM [place], Resident for [facility] to admit for Dr. France Ravens to [facility] Will transer to telemetry bed to [facility].        Jenness Corner, PA 09/10/11 1157  Jenness Corner, [place] 09/10/11 1617
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
## 2011-09-10 NOTE — ED Notes (Signed)
Per EMS, pt from home with c/o SOB. Pt is O2 dependent on 2L [place]. Pt O2 sat 100% per EMS. Pt in no obvious distress.

## 2011-09-10 NOTE — Progress Notes (Signed)
Initial visit with pt in ED on referral from nursing and at pt request.  Pt has financial concerns - is unable to pay bills and afford medications.    Pt has tension within family.  Pt lives with son [name]), who is unemployed.  The one family member (daughter in [place]) that knows of pt's financial situation is battling cancer and has 5 children.  Other siblings have alerted DSS of pt and daughter's situation.  Pt does not feel she can go to her children for help, as they will call DSS - pt also worried that they will be angry at [facility], who lives with pt.    Pt worried about what will happen to [facility] when she passes.    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  Congregation "broke up - now has little social support.    Chaplain provided support and prayed with pt.  Will refer to spiritual care and social worker at [employer].  Pt indicated she has a Mudlogger with her situation.  Notes show referral has been made.     09/10/11 1800  Clinical Encounter Type  Visited With Patient  Visit Type Initial;Spiritual support;Social support  Referral From Nurse  Consult/Referral To Chaplain  Spiritual Encounters  Spiritual Needs Emotional;Grief support  Stress Factors  Patient Stress Factors Financial concerns;Family relationships;Lack of caregivers

## 2011-09-10 NOTE — ED Notes (Signed)
[id number]> Expected date:<BR> Expected time:<BR> Means of arrival:<BR> Comments:<BR> Elderly female-SOB/O2 dependent

## 2011-09-10 NOTE — Discharge Planning (Signed)
Received referral from admission RN about need for home health and concerns with paying for medications.  CM spoke with ED SW who also got a referral Pt to be transferred to [facility].  Present [facility] SW continues to work with pt per ED SW.  Pt informed ED CM she uses [facility] and [facility]" agencies prior to admission. Prefers/ chose to continue using these services on d/c.  Discussed issue with [facility] of $300.  Reports she no longer uses [employer] for her medication services and now uses Advice worker but still has an Marine scientist at [employer].  There is not a [facility] assistance program for funds to pay her bills. Pt has already been to urban ministry to get funds to assist with her rent and other bills for this year.  Reports she can not return to this company for assistance. Reports present rent is $400 and she still can not afford it.  Pt wanting SW to assist with finding her another place to live to decrease the cost of her living

## 2011-09-10 NOTE — ED Notes (Signed)
Report give to [facility] RN

## 2011-09-10 NOTE — ED Notes (Signed)
Pt from home with c/o SOB. Pt states that she wears home O2 on 2L [place] all the time. Pt states she increased her O2 to 5L because she has been so SOB. Lungs are clear. Pt states that her chest hurts from "struggling to breath". Pt is currently on 4L-[place] and O2 sat 95%.

## 2011-09-10 NOTE — H&P (Signed)
Holly Kim is an 70 y.o. female.   Chief Complaint: chest pain HPI: This is a 70 YO Caucasian female with a significant cardiac history (CAD s/p stents 2011, grade 1 diastolic HF, history of PE/DVT) and pulmonary history (COPD on 4L O2 at home, asthma) presenting with chest pain x 2 days.  Started early Monday (11/26) around 2 am. Right-sided chest pain woke her up from sleep. Patient has difficulty sleeping at baseline and pain kept her awake. Pain worsened through day on Monday and started spreading to left-side of her chest. Associated with dyspnea. Patient initially attributed pain and dyspnea to not getting enough oxygen and increased her oxygen. But pain did not improve with this. Due to worsening of chest pain, patient had her son call EMS around 2:00 am Tuesday morning.  Pain is associated with radiating pain down left arm with numbness. Did not have any aspirin or NTG before arrival to [facility] ED.  Patient had elevated troponins in the ED and Cardiologist Dr. Elease Hashimoto was consulted. She was started on a heparin drip and given morphine for pain. Patient says morphine has been helping her pain. At the time of this interview, patient reports persistent left-sided chest pain radiating down left arm, but she is also complaining of pain diffusely in her abdomen. She says morphine helped initially but pain is returning. The pain feels similar to the pain she had when she had an MI in 2009. She is also complaining of significant dyspnea.  Past Medical History  Diagnosis Date  . PE (pulmonary embolism)   . DVT (deep venous thrombosis)   . Diabetes mellitus   . Asthma   . Hypertension   . CAD (coronary artery disease)     s/p pci rca in past; nstemi 12/2009 with rca occlusion/isr - 2.75 x 18-mm PROMUS DES placed.  residual ostial lcx dzs  . GERD (gastroesophageal reflux disease)   . COPD (chronic obstructive pulmonary disease)   . Obesity   . Tardive dyskinesia   . History of renal  failure   . Depression   . Borderline personality disorder   . Anxiety   . Anemia     hx of blood transfusion x 3    Past Surgical History  Procedure Date  . US echocardiography 2008  . Orif ankle fracture   . Appendectomy   . Cervical discectomy   . Ptca 06/2010  . Tonsillectomy   . Transthoracic echocardiogram 06/2010    Grade 1 diastolic, EF 55-60    Family History  Problem Relation Age of Onset  . Breast cancer      aunt  . Alcohol abuse Son   . Cancer Daughter   . Leukemia Father     deceased  . Alzheimer's disease Mother     deceased   Social History:  reports that she has been passively smoking (son smokes and daughter used to smoke; patient says son no longer smokes in house).  She has never used smokeless tobacco. She reports that she does not drink alcohol or use illicit drugs.  Allergies:  Allergies  Allergen Reactions  . Ibuprofen     REACTION: "mouth swells up and I can't swallow good"  . Amlodipine Besylate     REACTION: hypotension  . Amoxicillin     REACTION: itching  . Codeine Itching  . Cortisone Other (See Comments)    Pt states she gets "blood poisoning where they give me the shots of cortisone"  . Haloperidol Lactate  REACTION: My head gets places before my body  . Naproxen     REACTION: rash    Medications Prior to Admission  Medication Dose Route Frequency Provider Last Rate Last Dose  . aspirin chewable tablet 324 mg  324 mg Oral Once [employer], PA   324 mg at 09/10/11 0953  . heparin 100 units/mL bolus via infusion 4,000 Units  4,000 Units Intravenous Once Annia Belt, PHARMD   4,000 Units at 09/10/11 1747  . heparin ADULT infusion 100 units/ml (25000 units/250 ml)  1,000 Units/hr Intravenous Continuous Annia Belt, PHARMD 10 mL/hr at 09/10/11 1747 1,000 Units/hr at 09/10/11 1747  . morphine 2 MG/ML injection 4 mg  4 mg Intravenous Once Gerhard Munch, MD      . morphine 2 MG/ML injection           . morphine  4 MG/ML injection 4 mg  4 mg Intravenous Once [employer], PA   4 mg at 09/10/11 1601  . morphine 4 MG/ML injection 4 mg  4 mg Intravenous Once Jenness Corner, PA   4 mg at 09/10/11 0959  . morphine 4 MG/ML injection 4 mg  4 mg Intravenous Once Carleene Cooper III, MD   4 mg at 09/10/11 2000  . nitroGLYCERIN (NITROGLYN) 2 % ointment 0.5 inch  0.5 inch Topical Q6H Lenon Oms Hunt, PA   0.5 inch at 09/10/11 1852  . nitroGLYCERIN (NITROSTAT) SL tablet 0.4 mg  0.4 mg Sublingual Q5 min PRN Jenness Corner, PA   0.4 mg at 09/10/11 1937  . nitroGLYCERIN (NITROSTAT) SL tablet 0.4 mg  0.4 mg Sublingual Q5 min PRN Carleene Cooper III, MD      . ondansetron [facility]) injection 4 mg  4 mg Intravenous Once Jenness Corner, PA   4 mg at 09/10/11 0957  . DISCONTD: morphine 4 MG/ML injection 4 mg  4 mg Intravenous Once Carleene Cooper III, MD       Medications Prior to Admission  Medication Sig Dispense Refill  . albuterol (PROVENTIL) (2.5 MG/3ML) 0.083% nebulizer solution Take 3 mLs (2.5 mg total) by nebulization every 6 (six) hours as needed for wheezing. Do not use with albuterol inhaler.  75 mL  1  . albuterol (VENTOLIN HFA) 108 (90 BASE) MCG/ACT inhaler Inhale 2 puffs into the lungs every 6 (six) hours as needed for wheezing.  1 Inhaler  2  . aspirin 81 MG EC tablet Take 1 tablet (81 mg total) by mouth daily.  100 tablet  0  . clopidogrel (PLAVIX) 75 MG tablet Take 1 tablet (75 mg total) by mouth daily.  90 tablet  0  . docusate sodium (COLACE) 100 MG capsule Take 1 capsule (100 mg total) by mouth 2 (two) times daily.  100 capsule  1  . ferrous sulfate 325 (65 FE) MG tablet Take 1 tablet (325 mg total) by mouth daily.  90 tablet  2  . insulin aspart (NOVOLOG) 100 UNIT/ML injection 16 units prior to breakfast, 20 units prior to lunch, 24 prior units to dinner  20 mL  2  . insulin glargine (LANTUS) 100 UNIT/ML injection Inject 55 Units into the skin daily.  20 mL  2  . lisinopril (PRINIVIL,ZESTRIL) 5 MG tablet Take 1  tablet (5 mg total) by mouth daily.  90 tablet  0  . metoCLOPramide (REGLAN) 5 MG tablet Take 1 tablet (5 mg total) by mouth 4 (four) times daily.  120 tablet  2  .  metoprolol tartrate (LOPRESSOR) 25 MG tablet Take 1 tablet (25 mg total) by mouth 2 (two) times daily.  120 tablet  1  . morphine (MS CONTIN) 15 MG 12 hr tablet Take 15 mg by mouth 3 (three) times daily.       . Multiple Vitamins-Minerals (SENIOR MULTIVITAMIN PLUS) TABS Take 1 tablet by mouth daily.  100 tablet  1  . nystatin (MYCOSTATIN) powder Apply topically 2 (two) times daily. Apply to affected area   15 g  3  . nystatin cream (MYCOSTATIN) Apply topically 2 (two) times daily.  120 g  3  . omeprazole (PRILOSEC) 40 MG capsule Take 1 capsule (40 mg total) by mouth daily. 1 capsule by mouth  BID.  120 capsule  2  . promethazine (PHENERGAN) 25 MG tablet Take 1 tablet (25 mg total) by mouth every 6 (six) hours as needed for nausea.  120 tablet  3  . ranitidine (ZANTAC) 150 MG capsule Take 1 capsule (150 mg total) by mouth 2 (two) times daily.  120 capsule  2  . rosuvastatin (CRESTOR) 40 MG tablet Take 1 tablet (40 mg total) by mouth daily.  31 tablet  6  . senna (SENOKOT) 8.6 MG tablet Take 3 tablets by mouth daily.  180 tablet  3  . sucralfate (CARAFATE) 1 G tablet Take 1 tablet (1 g total) by mouth 3 (three) times daily before meals.  180 tablet  2  . torsemide (DEMADEX) 20 MG tablet Take 1 tablet (20 mg total) by mouth daily.  60 tablet  1    Results for orders placed during the hospital encounter of 09/10/11 (from the past 48 hour(s))  GLUCOSE, CAPILLARY     Status: Abnormal   Collection Time   09/10/11  7:59 AM      Component Value Range Comment   Glucose-Capillary 319 (*) 70 - 99 (mg/dL)   URINALYSIS, ROUTINE W REFLEX MICROSCOPIC     Status: Abnormal   Collection Time   09/10/11  8:31 AM      Component Value Range Comment   Color, Urine YELLOW  YELLOW     Appearance CLOUDY (*) CLEAR     Specific Gravity, Urine 1.017   1.005 - 1.030     pH 5.0  5.0 - 8.0     Glucose, UA 250 (*) NEGATIVE (mg/dL)    Hgb urine dipstick LARGE (*) NEGATIVE     Bilirubin Urine NEGATIVE  NEGATIVE     Ketones, ur 15 (*) NEGATIVE (mg/dL)    Protein, ur 30 (*) NEGATIVE (mg/dL)    Urobilinogen, UA 0.2  0.0 - 1.0 (mg/dL)    Nitrite NEGATIVE  NEGATIVE     Leukocytes, UA NEGATIVE  NEGATIVE    URINE MICROSCOPIC-ADD ON     Status: Abnormal   Collection Time   09/10/11  8:31 AM      Component Value Range Comment   Squamous Epithelial / LPF FEW (*) RARE     RBC / HPF 11-20  <3 (RBC/hpf)    Bacteria, UA FEW (*) RARE     Casts GRANULAR CAST (*) NEGATIVE     Urine-Other MUCOUS PRESENT     CBC     Status: Abnormal   Collection Time   09/10/11  8:37 AM      Component Value Range Comment   WBC 11.4 (*) 4.0 - 10.5 (K/uL)    RBC 3.58 (*) 3.87 - 5.11 (MIL/uL)    Hemoglobin 8.6 (*) 12.0 - 15.0 (  g/dL)    HCT 16.1 (*) 09.6 - 46.0 (%)    MCV 86.6  78.0 - 100.0 (fL)    MCH 24.0 (*) 26.0 - 34.0 (pg)    MCHC 27.7 (*) 30.0 - 36.0 (g/dL)    RDW 04.5  40.9 - 81.1 (%)    Platelets 146 (*) 150 - 400 (K/uL)   DIFFERENTIAL     Status: Abnormal   Collection Time   09/10/11  8:37 AM      Component Value Range Comment   Neutrophils Relative 89 (*) 43 - 77 (%)    Neutro Abs 10.1 (*) 1.7 - 7.7 (K/uL)    Lymphocytes Relative 8 (*) 12 - 46 (%)    Lymphs Abs 1.0  0.7 - 4.0 (K/uL)    Monocytes Relative 3  3 - 12 (%)    Monocytes Absolute 0.3  0.1 - 1.0 (K/uL)    Eosinophils Relative 0  0 - 5 (%)    Eosinophils Absolute 0.0  0.0 - 0.7 (K/uL)    Basophils Relative 0  0 - 1 (%)    Basophils Absolute 0.0  0.0 - 0.1 (K/uL)   BASIC METABOLIC PANEL     Status: Abnormal   Collection Time   09/10/11  8:37 AM      Component Value Range Comment   Sodium 139  135 - 145 (mEq/L)    Potassium 4.3  3.5 - 5.1 (mEq/L)    Chloride 96  96 - 112 (mEq/L)    CO2 37 (*) 19 - 32 (mEq/L)    Glucose, Bld 312 (*) 70 - 99 (mg/dL)    BUN 21  6 - 23 (mg/dL)    Creatinine,  Ser 9.14  0.50 - 1.10 (mg/dL)    Calcium 9.4  8.4 - 10.5 (mg/dL)    GFR calc non Af Amer 53 (*) >90 (mL/min)    GFR calc Af Amer 61 (*) >90 (mL/min)   CARDIAC PANEL(CRET KIN+CKTOT+MB+TROPI)     Status: Abnormal   Collection Time   09/10/11  8:37 AM      Component Value Range Comment   Total CK 33  7 - 177 (U/L)    CK, MB 3.7  0.3 - 4.0 (ng/mL)    Troponin I 0.37 (*) <0.30 (ng/mL)    Relative Index RELATIVE INDEX IS INVALID  0.0 - 2.5    PROTIME-INR     Status: Normal   Collection Time   09/10/11  8:37 AM      Component Value Range Comment   Prothrombin Time 13.7  11.6 - 15.2 (seconds)    INR 1.03  0.00 - 1.49    PRO B NATRIURETIC PEPTIDE     Status: Abnormal   Collection Time   09/10/11  8:37 AM      Component Value Range Comment   BNP, POC 1603.0 (*) 0 - 125 (pg/mL)   CARDIAC PANEL(CRET KIN+CKTOT+MB+TROPI)     Status: Abnormal   Collection Time   09/10/11 12:15 PM      Component Value Range Comment   Total CK 48  7 - 177 (U/L)    CK, MB 6.3 (*) 0.3 - 4.0 (ng/mL)    Troponin I 0.74 (*) <0.30 (ng/mL)    Relative Index RELATIVE INDEX IS INVALID  0.0 - 2.5    GLUCOSE, CAPILLARY     Status: Abnormal   Collection Time   09/10/11  8:44 PM      Component Value Range Comment  Glucose-Capillary 256 (*) 70 - 99 (mg/dL)    Dg Chest 2 View  52/84/1324  *RADIOLOGY REPORT*  Clinical Data: Chest pain, shortness of breath.  CHEST - 2 VIEW  Comparison: 06/29/2011  Findings: There is cardiomegaly with vascular congestion. Peribronchial thickening.  No overt edema.  No confluent opacities or effusions.  Persistent left basilar airspace opacity.  No definite effusions.  No acute bony abnormality.  IMPRESSION: Cardiomegaly with vascular congestion and chronic peribronchial thickening.  Chronic left basilar opacity, presumably atelectasis.  Original Report Authenticated By: Cyndie Chime, M.D.    ROS Per HPI with inclusion of following: Denies fevers, chills Denies sore throat, cough Denies  abdominal pain/nausea/vomiting/diarrhea Denies worsening lower extremity pain (patient has chronic pain in legs)  Blood pressure 154/43, pulse 67, temperature 98.9 F (37.2 C), temperature source Oral, resp. rate 23, height 5\' 5"  (1.651 m), SpO2 97.00%. Physical Exam  Gen: moderate distress with mild supraclavicular retractions but she appears anxious; morbidly obese Psych: engaged, appropriate, difficult to hear her speech sometimes due to her tardive dyskinesia HEENT:   Mildly dry MM   Eyes: normal   Nose: no nasal discharge   Neck: no LAD CV: distant heart sounds, RRR based on palpating radial pulses, 2+ pedal pulses Pulm: mild supraclavicular retractions, breath sounds very soft, decreased breath sounds bilaterally at bases, no wheezes or ronchi Chest: displays acute tenderness with palpation over left chest but also over right Abd: obese, NABS, soft, moderate tenderness throughout, no guarding or rebound, does not appear distended Ext: warm, pink, tender throughout tibia bilaterally, no erythema or palpable cords, 1+ pitting pretibial edema bilaterally Neuro: no focal deficits; tardive dyskinesia of her tongue; resting tremor bilateral hands but worse on right  Assessment/Plan This is a 70 YO Caucasian female with a significant cardiac history (CAD s/p stents 2011, grade 1 diastolic HF, history of PE/DVT) and pulmonary history (COPD on 4L O2 at home, asthma) presenting with typical chest pain and found to have elevated cardiac enzymes x 2.   Chest pain Cardiac enzymes elevated 0.37 then 0.74. Dr. Elease Hashimoto consulted in the ED at [facility]. He is not sure if chest pain is cardiac in origin despite troponins due to no changes on ECG and since pain is easily reproducible. Another component of pain may be due to her narcotics and BDZ recently being discontinued by the patient's PCP per the patient.  -Will follow-up with Cards recommendations, including continuing heparin gtt and nitro paste. Continue  home meds ASA, Plavix, metoprolol, lisinopril, Crestor. -Will continue to trend troponins -Will give morphine prn pain for now.  -Will avoid BDZ in this obese individual complaining of dyspnea.   Acute on chronic pain -Will hold MS Contin for now and give morphine IV prn pain  Dyspnea No edema on CXR but pro-BNP elevated and with signs of fluid overload lower extremity edema and higher blood pressures. May be fluid overloaded. Although no overt pulmonary edema on CXR, CXR hazy throughout.  -Will double torsemide for now to 20 mg bid (from 20 mg qd) to see if this helps her breathing.   HTN -Continue home meds. See Chest pain. Will double torsemide  HLD -Continue Crestor  DM -Will decrease home Lantus from 55 to 30 units for now -SSI  COPD, asthma  -Albuterol nebs q4 prn  GERD -Protonix 40 mg po qd  Renal  -Will f/u urine culture  Psych: history of anxiety, depression, borderline personality disorder Not on home meds. BDZ recently discontinued per patient. -  Will monitor for now. Avoid BDZ due to respiratory issues.  Social Patient with financial concerns about paying for hospital bills and living expenses. SW has been assisting with the patient's living situation. -Will f/u with SW [name] in the AM   FEN/GI IVF @ K[facility]. Moderate carbohydrate modified diet. PPx Heparin SQ tid for DVT PPx.  Dispo Pending cardiac work-up and clinical improvement.   [place] PARK, Holly Kim 09/10/2011, 9:42 PM

## 2011-09-11 ENCOUNTER — Encounter (HOSPITAL_COMMUNITY)
Admission: EM | Disposition: A | Discharge status: Home / Self Care | Payer: Self-pay | Source: Home / Self Care | Attending: Family Medicine

## 2011-09-11 ENCOUNTER — Other Ambulatory Visit: Payer: Self-pay

## 2011-09-11 DIAGNOSIS — D638 Anemia in other chronic diseases classified elsewhere: Secondary | ICD-10-CM

## 2011-09-11 DIAGNOSIS — E118 Type 2 diabetes mellitus with unspecified complications: Secondary | ICD-10-CM

## 2011-09-11 DIAGNOSIS — I2510 Atherosclerotic heart disease of native coronary artery without angina pectoris: Secondary | ICD-10-CM

## 2011-09-11 HISTORY — PX: PERCUTANEOUS CORONARY INTERVENTION-BALLOON ONLY: SHX6014

## 2011-09-11 HISTORY — PX: LEFT HEART CATHETERIZATION WITH CORONARY ANGIOGRAM: SHX5451

## 2011-09-11 LAB — CBC
HCT: 26.3 % — ABNORMAL LOW (ref 36.0–46.0)
Hemoglobin: 8.1 g/dL — ABNORMAL LOW (ref 12.0–15.0)
Hemoglobin: 7.5 g/dL — ABNORMAL LOW (ref 12.0–15.0)
MCHC: 27.6 g/dL — ABNORMAL LOW (ref 30.0–36.0)
Platelets: 134 10*3/uL — ABNORMAL LOW (ref 150–400)
RBC: 3.1 MIL/uL — ABNORMAL LOW (ref 3.87–5.11)
RDW: 14.3 % (ref 11.5–15.5)
WBC: 6.3 10*3/uL (ref 4.0–10.5)

## 2011-09-11 LAB — CARDIAC PANEL(CRET KIN+CKTOT+MB+TROPI)
Relative Index: INVALID (ref 0.0–2.5)
Relative Index: INVALID (ref 0.0–2.5)
Relative Index: INVALID (ref 0.0–2.5)
Total CK: 49 U/L (ref 7–177)
Troponin I: 1.91 ng/mL (ref <0.30)

## 2011-09-11 LAB — CREATININE, SERUM
GFR calc Af Amer: 69 mL/min — ABNORMAL LOW (ref >90)
GFR calc non Af Amer: 60 mL/min — ABNORMAL LOW (ref >90)

## 2011-09-11 LAB — BASIC METABOLIC PANEL
BUN: 15 mg/dL (ref 6–23)
CO2: 39 mEq/L — ABNORMAL HIGH (ref 19–32)
Chloride: 95 mEq/L — ABNORMAL LOW (ref 96–112)
Creatinine, Ser: 0.86 mg/dL (ref 0.50–1.10)
Glucose, Bld: 278 mg/dL — ABNORMAL HIGH (ref 70–99)

## 2011-09-11 LAB — URINE CULTURE
Colony Count: NO GROWTH
Culture: NO GROWTH

## 2011-09-11 LAB — GLUCOSE, CAPILLARY: Glucose-Capillary: 183 mg/dL — ABNORMAL HIGH (ref 70–99)

## 2011-09-11 LAB — POCT ACTIVATED CLOTTING TIME: Activated Clotting Time: 545 seconds

## 2011-09-11 LAB — HEPARIN LEVEL (UNFRACTIONATED): Heparin Unfractionated: 0.12 IU/mL — ABNORMAL LOW (ref 0.30–0.70)

## 2011-09-11 SURGERY — LEFT HEART CATHETERIZATION WITH CORONARY ANGIOGRAM
Anesthesia: LOCAL

## 2011-09-11 MED ORDER — LEVALBUTEROL HCL 0.63 MG/3ML IN NEBU
0.6300 mg | INHALATION_SOLUTION | Freq: Four times a day (QID) | RESPIRATORY_TRACT | Status: DC
  Administered 2011-09-11 (×2): 0.63 mg via RESPIRATORY_TRACT
  Filled 2011-09-11 (×6): qty 3

## 2011-09-11 MED ORDER — BIVALIRUDIN 250 MG IV SOLR
INTRAVENOUS | Status: AC
  Filled 2011-09-11: qty 250

## 2011-09-11 MED ORDER — SODIUM CHLORIDE 0.9 % IV SOLN: INTRAVENOUS | Status: DC

## 2011-09-11 MED ORDER — ASPIRIN 81 MG PO CHEW
81.0000 mg | CHEWABLE_TABLET | Freq: Every day | ORAL | Status: DC
  Administered 2011-09-12 – 2011-09-13 (×2): 81 mg via ORAL
  Filled 2011-09-11: qty 1

## 2011-09-11 MED ORDER — ENOXAPARIN SODIUM 40 MG/0.4ML ~~LOC~~ SOLN
40.0000 mg | SUBCUTANEOUS | Status: DC
  Administered 2011-09-12 – 2011-09-13 (×2): 40 mg via SUBCUTANEOUS
  Filled 2011-09-11 (×3): qty 0.4

## 2011-09-11 MED ORDER — HEPARIN SOD (PORCINE) IN D5W 100 UNIT/ML IV SOLN
1400.0000 [IU]/h | INTRAVENOUS | Status: DC
  Administered 2011-09-11: 1400 [IU]/h via INTRAVENOUS
  Filled 2011-09-11: qty 250

## 2011-09-11 MED ORDER — ASPIRIN 81 MG PO CHEW: 324.0000 mg | CHEWABLE_TABLET | ORAL | Status: DC

## 2011-09-11 MED ORDER — IPRATROPIUM BROMIDE 0.02 % IN SOLN
0.5000 mg | Freq: Four times a day (QID) | RESPIRATORY_TRACT | Status: DC
  Administered 2011-09-11: 0.5 mg via RESPIRATORY_TRACT

## 2011-09-11 MED ORDER — SODIUM CHLORIDE 0.9 % IJ SOLN: 3.0000 mL | INTRAMUSCULAR | Status: DC | PRN

## 2011-09-11 MED ORDER — NITROGLYCERIN 0.2 MG/ML ON CALL CATH LAB
INTRAVENOUS | Status: AC
  Filled 2011-09-11: qty 1

## 2011-09-11 MED ORDER — VERAPAMIL HCL 2.5 MG/ML IV SOLN
INTRAVENOUS | Status: AC
  Filled 2011-09-11: qty 2

## 2011-09-11 MED ORDER — MIDAZOLAM HCL 2 MG/2ML IJ SOLN
INTRAMUSCULAR | Status: AC
  Filled 2011-09-11: qty 2

## 2011-09-11 MED ORDER — IPRATROPIUM BROMIDE 0.02 % IN SOLN
0.5000 mg | RESPIRATORY_TRACT | Status: DC
Start: 2011-09-11 — End: 2011-09-11
  Administered 2011-09-11: 0.5 mg via RESPIRATORY_TRACT
  Filled 2011-09-11 (×2): qty 2.5

## 2011-09-11 MED ORDER — SODIUM CHLORIDE 0.9 % IV SOLN
INTRAVENOUS | Status: DC
  Administered 2011-09-11: 12:00:00 via INTRAVENOUS

## 2011-09-11 MED ORDER — LIDOCAINE HCL (PF) 1 % IJ SOLN
INTRAMUSCULAR | Status: AC
  Filled 2011-09-11: qty 30

## 2011-09-11 MED ORDER — FUROSEMIDE 10 MG/ML IJ SOLN
40.0000 mg | Freq: Two times a day (BID) | INTRAMUSCULAR | Status: DC
  Administered 2011-09-11 – 2011-09-14 (×7): 40 mg via INTRAVENOUS
  Filled 2011-09-11 (×9): qty 4

## 2011-09-11 MED ORDER — HEPARIN BOLUS VIA INFUSION
3000.0000 [IU] | Freq: Once | INTRAVENOUS | Status: AC
  Administered 2011-09-11: 3000 [IU] via INTRAVENOUS
  Filled 2011-09-11: qty 3000

## 2011-09-11 MED ORDER — FENTANYL CITRATE 0.05 MG/ML IJ SOLN
INTRAMUSCULAR | Status: AC
  Filled 2011-09-11: qty 2

## 2011-09-11 MED ORDER — SODIUM CHLORIDE 0.9 % IJ SOLN: 3.0000 mL | Freq: Two times a day (BID) | INTRAMUSCULAR | Status: DC

## 2011-09-11 MED ORDER — SODIUM CHLORIDE 0.9 % IV SOLN
250.0000 mL | INTRAVENOUS | Status: DC | PRN
  Administered 2011-09-11: 250 mL via INTRAVENOUS

## 2011-09-11 MED ORDER — HEPARIN (PORCINE) IN NACL 2-0.9 UNIT/ML-% IJ SOLN
INTRAMUSCULAR | Status: AC
  Filled 2011-09-11: qty 2000

## 2011-09-11 MED ORDER — CLOPIDOGREL BISULFATE 300 MG PO TABS
ORAL_TABLET | ORAL | Status: AC
  Filled 2011-09-11: qty 2

## 2011-09-11 MED ORDER — NITROGLYCERIN IN D5W 200-5 MCG/ML-% IV SOLN
INTRAVENOUS | Status: AC
  Administered 2011-09-11: 10 ug/min via INTRAVENOUS
  Filled 2011-09-11: qty 250

## 2011-09-11 MED ORDER — SODIUM CHLORIDE 0.9 % IV SOLN: 250.0000 mL | INTRAVENOUS | Status: DC | PRN

## 2011-09-11 MED ORDER — MORPHINE SULFATE 2 MG/ML IJ SOLN
2.0000 mg | INTRAMUSCULAR | Status: AC
  Administered 2011-09-11: 2 mg via INTRAVENOUS
  Filled 2011-09-11: qty 1

## 2011-09-11 MED ORDER — DIAZEPAM 5 MG PO TABS: 5.0000 mg | ORAL_TABLET | ORAL | Status: DC

## 2011-09-11 MED ORDER — CLOPIDOGREL BISULFATE 75 MG PO TABS: 75.0000 mg | ORAL_TABLET | ORAL | Status: DC

## 2011-09-11 MED ORDER — NITROGLYCERIN IN D5W 200-5 MCG/ML-% IV SOLN
10.0000 ug/min | INTRAVENOUS | Status: DC
  Administered 2011-09-11: 10 ug/min via INTRAVENOUS

## 2011-09-11 MED ORDER — ASPIRIN 81 MG PO CHEW
CHEWABLE_TABLET | ORAL | Status: AC
  Administered 2011-09-11: 81 mg
  Filled 2011-09-11: qty 1

## 2011-09-11 MED ORDER — HYDROCODONE-ACETAMINOPHEN 5-325 MG PO TABS
1.0000 | ORAL_TABLET | ORAL | Status: DC | PRN
  Administered 2011-09-11 – 2011-09-14 (×2): 2 via ORAL
  Filled 2011-09-11 (×2): qty 2

## 2011-09-11 MED ORDER — SODIUM CHLORIDE 0.9 % IJ SOLN
3.0000 mL | Freq: Two times a day (BID) | INTRAMUSCULAR | Status: DC
  Administered 2011-09-11 – 2011-09-14 (×7): 3 mL via INTRAVENOUS

## 2011-09-11 MED ORDER — MORPHINE SULFATE 4 MG/ML IJ SOLN
4.0000 mg | INTRAMUSCULAR | Status: DC | PRN
  Administered 2011-09-11 – 2011-09-14 (×12): 4 mg via INTRAVENOUS
  Filled 2011-09-11 (×12): qty 1

## 2011-09-11 MED ORDER — SODIUM CHLORIDE 0.9 % IJ SOLN
3.0000 mL | INTRAMUSCULAR | Status: DC | PRN
  Administered 2011-09-13 – 2011-09-14 (×2): 3 mL via INTRAVENOUS

## 2011-09-11 MED ORDER — SODIUM CHLORIDE 0.9 % IV SOLN
INTRAVENOUS | Status: AC
  Administered 2011-09-11: 18:00:00 via INTRAVENOUS

## 2011-09-11 NOTE — Progress Notes (Signed)
Case Management:   09/11/11 1030--UR completed. Tera Mater, RN, BSN Case Manager # [phone]

## 2011-09-11 NOTE — Interval H&P Note (Signed)
History and Physical Interval Note:  09/11/2011 3:14 PM  Holly Kim  has presented today for surgery, with the diagnosis of Left heart cath  The various methods of treatment have been discussed with the patient and family. After consideration of risks, benefits and other options for treatment, the patient has consented to  Procedure(s): LEFT HEART CATHETERIZATION WITH CORONARY ANGIOGRAM as a surgical intervention .  The patients' history has been reviewed, patient examined, no change in status, stable for surgery.  I have reviewed the patients' chart and labs.  Questions were answered to the patient's satisfaction.     Lorine Bears

## 2011-09-11 NOTE — Progress Notes (Signed)
Subjective: Still complaining of chest pain. Located diffusely across chest but more pronounced on left side and continues to radiate down left arm.  Morphine helps the pain. Unclear whether nitroglycerin gtt has been helping.   Objective: Vital signs in last 24 hours: Temp:  [98.2 F (36.8 C)-98.9 F (37.2 C)] 98.4 F (36.9 C) (11/28 0746) Pulse Rate:  [64-89] 69  (11/28 0746) Resp:  [16-26] 16  (11/28 0746) BP: (105-154)/(39-83) 132/66 mmHg (11/28 0746) SpO2:  [96 %-100 %] 100 % (11/28 0746) FiO2 (%):  [4 %] 4 % (11/28 0746) Weight:  [290 lb (131.543 kg)] 290 lb (131.543 kg) (11/27 2100) Weight change:  Last BM Date: 09/09/11  Intake/Output from previous day: 11/27 0701 - 11/28 0700 In: 915 [I.V.:915] Out: 1100 [Urine:1100] Intake/Output this shift:   Physical exam: Gen: moderate distress with mild supraclavicular retractions but she appears anxious; morbidly obese  Psych: engaged, appropriate, difficult to hear her speech sometimes due to her tardive dyskinesia  CV: distant heart sounds, RRR based on palpating radial pulses, 2+ pedal pulses  Pulm: mild supraclavicular retractions, breath sounds very soft, decreased breath sounds bilaterally at bases, occasional end expiratory wheeze Chest: displays acute tenderness with palpation over left chest but also over right  Abd: obese, NABS, soft, moderate tenderness throughout, no guarding or rebound, does not appear distended  Ext: left upper extremity is cool compared to right; 1+ pretibial edema bilaterally Neuro: no focal deficits; 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  CHEST - 2 VIEW  Comparison: 06/29/2011  Findings: There is cardiomegaly with vascular congestion. Peribronchial thickening.  No overt edema.  No confluent opacities or effusions.  Persistent left basilar airspace opacity.  No definite effusions.  No acute bony abnormality.  IMPRESSION: Cardiomegaly with vascular congestion and chronic peribronchial thickening.  Chronic left basilar opacity, presumably atelectasis.  Original Report Authenticated By: Cyndie Chime, M.D.    Medications:  Scheduled:   . aspirin      . aspirin  324 mg Oral Once  . clopidogrel  75 mg Oral Daily  . docusate sodium  100 mg Oral BID  . ferrous sulfate  325 mg Oral Daily  . furosemide  40 mg Intravenous BID  . heparin  4,000 Units Intravenous Once  . insulin aspart  0-15 Units Subcutaneous TID WC  . insulin aspart  5 Units Subcutaneous TID WC  . insulin glargine  30 Units Subcutaneous Q0700  . ipratropium  0.5 mg Nebulization Q4H  . levalbuterol  0.63 mg Nebulization Q6H  . lisinopril  5 mg Oral Daily  . metoCLOPramide  5 mg Oral QID  . metoprolol tartrate  25 mg Oral BID  .  morphine injection  2 mg Intravenous NOW  . morphine      .  morphine injection  4 mg Intravenous Once  .  morphine injection  4 mg Intravenous Once  .  morphine injection  4 mg Intravenous Once  .  multivitamins ther. w/minerals  1 tablet Oral Daily  . nystatin   Topical BID  . nystatin cream  1 application Topical BID  . ondansetron  4 mg Intravenous Once  . pantoprazole  40 mg Oral Q1200  . rosuvastatin  40 mg Oral Daily  . senna  3 tablet Oral Daily  . sodium chloride  3 mL Intravenous Q12H  . sucralfate  1 g Oral TID AC  . DISCONTD: aspirin  81 mg Oral Daily  . DISCONTD: aspirin EC  81 mg Oral Daily  . DISCONTD: heparin  5,000 Units Subcutaneous Q8H  . DISCONTD:  morphine injection  4 mg Intravenous Once  . DISCONTD:  morphine  injection  4 mg Intravenous Once  . DISCONTD: nitroGLYCERIN  0.5 inch Topical Q6H  . DISCONTD: nystatin   Topical BID  . DISCONTD: Senior Multivitamin Plus  1 tablet Oral Daily  . DISCONTD: senna  3 tablet Oral Daily  . DISCONTD: torsemide  20 mg Oral BID   Continuous:   . heparin 1,000 Units/hr (09/10/11 1747)  . nitroGLYCERIN 20 mcg/min (09/11/11 0727)   EAV:WUJWJX chloride, acetaminophen, acetaminophen, albuterol, morphine injection, nitroGLYCERIN, nitroGLYCERIN, ondansetron (ZOFRAN) IV, ondansetron, sodium chloride, DISCONTD:  morphine injection  Assessment/Plan: This is a 70 YO Caucasian female with a significant cardiac history (CAD s/p stents 2011, grade 1 diastolic HF, history of PE/DVT) and pulmonary history (COPD on 4L O2 at home, asthma) presenting with chest pain.   Chest pain  -Per Cardiology, NPO for left heart catheterization today -Continue nitro gtt, heparin gtt, metoprolol, lisinopril, ASA/Plavix -Consider work-up for PE if catheterization not helpful  Acute on chronic pain  -Will hold MS Contin for now and give morphine IV prn pain   Dyspnea 2/2 CHF exacerbation versus COPD/asthma exacerbation -Unchanged dyspnea. Cards is changing her diuretic from torsemide PO to Lasix 40 IV bid and adding atrovent/levalbuterol nebs  HTN -See above  HLD  -Will hold Crestor  DM  -Blood sugars elevated but will continue decreased Lantus dose at 30 for now since NPO -SSI  GERD  -Protonix 40 mg po qd   Renal  -Will f/u urine culture  History of gastroparesis -Continue home Reglan and sucralfate   Psych: history of anxiety, depression, borderline personality disorder Not on home meds. BDZ recently discontinued per patient.  -Will monitor for now. Avoid BDZ due to respiratory issues.   Social Patient with financial concerns about paying for hospital bills and living expenses. SW has been assisting with the patient's living situation.  -Will f/u with SW [name] in  the AM  FEN/GI IVF @ [facility]. NPO.  PPx Heparin SQ tid for DVT PPx.  Dispo Pending cardiac work-up and clinical improvement.     LOS: 1 day   OH PARK, Scotlynn Noyes 09/11/2011, 9:05 AM

## 2011-09-11 NOTE — Clinical Documentation Improvement (Signed)
GENERIC DOCUMENTATION CLARIFICATION QUERY  THIS DOCUMENT IS NOT A PERMANENT PART OF THE MEDICAL RECORD  Please update your documentation within the medical record to reflect your response to this query.                                                                                         09/11/11    Dear Dr.McLean / Associates,  In a better effort to capture your patient's severity of illness, reflect appropriate length of stay and utilization of resources, a review of the patient medical record has revealed the following indicators.  Please see instructions below for help in responding to this Query.  In responding to this query please exercise your independent judgment.  The fact that a query is asked, does not imply that any particular answer is desired or expected.  Possible Clinical Conditions?  Acute MI: NSTEMI, STEMI Other Condition Cannot Clinically Determine   Supporting Information:  Risk Factors: hx CAD s/p PCI DES RCA, hx MI Signs & Symptoms: Acute on Chronic CP persistent Diagnostics: Trop: 1.43, 1.91, 1.65,  CKMB: 9.7, 7.8, 6.8  Treatment: CE, Tele, NTG IV, possible Cath  You may use possible, probable, or suspect with inpatient documentation. possible, probable, suspected diagnoses MUST be documented at the time of discharge  Reviewed:  no additional documentation provided  Thank You, Sincerely, Beverley Fiedler RN   Clinical Documentation Specialist: Cell:  [phone]  Health Information Management   TO RESPOND TO THE THIS QUERY, FOLLOW THE INSTRUCTIONS BELOW:  1. If needed, update documentation for the patient's encounter via the notes activity.  2. Access this query again and click edit on the [employer].  3. After updating, or not, click F2 to complete all highlighted (required) fields concerning your review. Select "additional documentation in the medical record" OR "no additional documentation provided".  4. Click Sign note button.    5. The deficiency will fall out of your InBasket *Please let us know if you are not able to compete this workflow by phone or e-mail (listed below).

## 2011-09-11 NOTE — H&P (View-Only) (Signed)
Patient ID: Holly Kim, female   DOB: Dec 28, 1940, 70 y.o.   MRN: [id number] [facility]  SUBJECTIVE:Patient continues to have chest pain radiating down her left arm.  She is wheezing and there is a pleuritic component.  Her chest wall is tender (chronically).  She is short of breath.  Morphine helps the pain, hard to tell if NTG helps much.  Troponin rose to 1.91.     Filed Vitals:   09/11/11 0442 09/11/11 0500 09/11/11 0600 09/11/11 0746  BP: 144/49 139/50 133/56 132/66  Pulse: 70 66 66 69  Temp: 98.2 F (36.8 C)   98.4 F (36.9 C)  TempSrc: Oral   Oral  Resp: 21 21 22 16   Height:      Weight:      SpO2: 100% 100% 100% 100%    Intake/Output Summary (Last 24 hours) at 09/11/11 0826 Last data filed at 09/11/11 0700  Gross per 24 hour  Intake    915 ml  Output   1100 ml  Net   -185 ml    LABS: Basic Metabolic Panel:  Basename 09/10/11 0837  NA 139  K 4.3  CL 96  CO2 37*  GLUCOSE 312*  BUN 21  CREATININE 1.03  CALCIUM 9.4  MG --  PHOS --   Liver Function Tests: No results found for this basename: AST:2,ALT:2,ALKPHOS:2,BILITOT:2,PROT:2,ALBUMIN:2 in the last 72 hours No results found for this basename: LIPASE:2,AMYLASE:2 in the last 72 hours CBC:  Basename 09/10/11 0837  WBC 11.4*  NEUTROABS 10.1*  HGB 8.6*  HCT 31.0*  MCV 86.6  PLT 146*   Cardiac Enzymes:  Basename 09/11/11 0405 09/10/11 2237 09/10/11 1215  CKTOTAL 54 67 48  CKMB 7.8* 9.7* 6.3*  CKMBINDEX -- -- --  TROPONINI 1.91* 1.43* 0.74*   BNP:  Basename 09/10/11 0837  POCBNP 1603.0*      . aspirin      . aspirin  324 mg Oral Once  . aspirin EC  81 mg Oral Daily  . clopidogrel  75 mg Oral Daily  . docusate sodium  100 mg Oral BID  . ferrous sulfate  325 mg Oral Daily  . furosemide  40 mg Intravenous BID  . heparin  4,000 Units Intravenous Once  . insulin aspart  0-15 Units Subcutaneous TID WC  . insulin aspart  5 Units Subcutaneous TID WC  . insulin glargine  30 Units  Subcutaneous Q0700  . ipratropium  0.5 mg Nebulization Q4H  . levalbuterol  0.63 mg Nebulization Q6H  . lisinopril  5 mg Oral Daily  . metoCLOPramide  5 mg Oral QID  . metoprolol tartrate  25 mg Oral BID  .  morphine injection  2 mg Intravenous NOW  . morphine      .  morphine injection  4 mg Intravenous Once  .  morphine injection  4 mg Intravenous Once  .  morphine injection  4 mg Intravenous Once  . multivitamins ther. w/minerals  1 tablet Oral Daily  . nystatin   Topical BID  . nystatin cream  1 application Topical BID  . ondansetron  4 mg Intravenous Once  . pantoprazole  40 mg Oral Q1200  . rosuvastatin  40 mg Oral Daily  . senna  3 tablet Oral Daily  . sodium chloride  3 mL Intravenous Q12H  . sucralfate  1 g Oral TID AC  . DISCONTD: aspirin  81 mg Oral Daily  . DISCONTD: heparin  5,000 Units Subcutaneous Q8H  .  DISCONTD:  morphine injection  4 mg Intravenous Once  . DISCONTD:  morphine injection  4 mg Intravenous Once  . DISCONTD: nitroGLYCERIN  0.5 inch Topical Q6H  . DISCONTD: nystatin   Topical BID  . DISCONTD: Senior Multivitamin Plus  1 tablet Oral Daily  . DISCONTD: senna  3 tablet Oral Daily  . DISCONTD: torsemide  20 mg Oral BID  heparin gtt NTG gtt at 20  RADIOLOGY: Dg Chest 2 View  09/10/2011  *RADIOLOGY REPORT*  Clinical Data: Chest pain, shortness of breath.  CHEST - 2 VIEW  Comparison: 06/29/2011  Findings: There is cardiomegaly with vascular congestion. Peribronchial thickening.  No overt edema.  No confluent opacities or effusions.  Persistent left basilar airspace opacity.  No definite effusions.  No acute bony abnormality.  IMPRESSION: Cardiomegaly with vascular congestion and chronic peribronchial thickening.  Chronic left basilar opacity, presumably atelectasis.  Original Report Authenticated By: Cyndie Chime, M.D.    PHYSICAL EXAM General: NAD, obese Neck: Thick, JVP 8-9 cm, no thyromegaly or thyroid nodule.  Lungs: Bilateral end expiratory  wheezes CV: Nondisplaced PMI.  Heart regular S1/S2, no S3/S4, no murmur.  1+ ankle edema bilaterally.  No carotid bruit.   Abdomen: Soft, nontender, no hepatosplenomegaly, no distention.  Neurologic: Alert and oriented x 3.  Psych: Normal affect. Extremities: No clubbing or cyanosis.   TELEMETRY: Reviewed telemetry pt in NSR:  ASSESSMENT AND PLAN: 70 yo obese woman with COPD on home oxygen, prior PE/DVT, CAD with history of RCA PCI in 3/11, diastolic CHF, and chronic chest wall pain presented with worsening of her baseline chest pain as well as increased dyspnea.   1. CAD: History of RCA PCI in 3/11.  She continues to have chest pain.  There is some chest wall tenderness.  There is a pleuritic component and she is wheezing.  Her troponin has risen to 1.91 with elevated CKMB.  She will need a left heart cath this morning (use radial access).  If this is unremarkable, will need V/Q scan to assess for PE.  Continue ASA, Plavix, heparin gtt, statin, ACEI, beta blocker.  Needs BMET this am before cath.  2. COPD: Patient is wheezing on exam, has history of COPD.  She also appears volume overloaded on exam, so wheezing could be due to COPD or pulmonary edema.  I am going to start her on atrovent and levalbuterol nebs, should get these soon.  3. CHF: History of diastolic CHF.  Suspect acute on chronic diastolic CHF exacerbation.  She appears volume overloaded on exam.  Will change diuretic to Lasix 40 mg IV bid for now and assess response.    Marca Ancona 09/11/2011 8:34 AM

## 2011-09-11 NOTE — Progress Notes (Signed)
Elevated tropnonins Troponins continue to climb. Now at 1.34. Patient still complaining of chest pain. Experiencing mild improvement with morphine. Last received around 23:00.   Discussed with Dr. Mayford Knife, cardiologist on-call for  who is consulting on the patient. She recommends following: -D/C nitro paste -Start nitro gtt -Give 1 dose of morphine now  FMTS will follow-up on pain and call Dr. Mayford Knife if pain not improved with these measurements. Will also re-check cardiac enzymes at 0500 am (6 hours after last set).

## 2011-09-11 NOTE — Progress Notes (Signed)
INITIAL ADULT NUTRITION ASSESSMENT Date: 09/11/2011   Time: 11:13 AM Reason for Assessment: Nutrition Risk Assessment?20# weight loss  ASSESSMENT: Female 70 y.o.  Dx: Unstable angina, CHF vs COPD exacerbation  Hx:  Past Medical History  Diagnosis Date  . PE (pulmonary embolism)   . DVT (deep venous thrombosis)   . Diabetes mellitus   . Asthma   . Hypertension   . CAD (coronary artery disease)     s/p pci rca in past; nstemi 12/2009 with rca occlusion/isr - 2.75 x 18-mm PROMUS DES placed.  residual ostial lcx dzs  . GERD (gastroesophageal reflux disease)   . COPD (chronic obstructive pulmonary disease)   . Obesity   . Tardive dyskinesia   . History of renal failure   . Depression   . Borderline personality disorder   . Anxiety   . Anemia     hx of blood transfusion x 3   Related Meds: Plavix, Colace, Iron, Lasix, Heparin, insulin, reglan, MVI, morphine, protonix,  Ht: 5\' 5"  (165.1 cm)  Wt: 290 lb (131.543 kg)  Ideal Wt: 57 kg  % Ideal Wt: 230  Usual Wt: 286-300# % Usual Wt: 97-101  Body mass index is 48.26 kg/(m^2).  Food/Nutrition Related Hx: Son cooks for pt.  Uses "lite" salt.  Has dentures at home but doesn't wear them.  No problems chewing or swallowing without dentures.  Decreased intake over last month due to chest pain resulting in a 20# weight loss.  Labs: Results for HAJER, DWYER (MRN [id number]) as of 09/11/2011 11:20  Ref. Range 09/10/2011 08:37  Sodium Latest Range: 135-145 mEq/L 139  Potassium Latest Range: 3.5-5.1 mEq/L 4.3  Chloride Latest Range: 96-112 mEq/L 96  CO2 Latest Range: 19-32 mEq/L 37 (H)  BUN Latest Range: 6-23 mg/dL 21  Creat Latest Range: 0.50-1.10 mg/dL 4.09  Calcium Latest Range: 8.4-10.5 mg/dL 9.4  GFR calc non Af Amer Latest Range: >90 mL/min 53 (L)  GFR calc Af Amer Latest Range: >90 mL/min 61 (L)  Glucose Latest Range: 70-99 mg/dL 811 (H)    Intake:  Output:   Diet Order: NPO  Supplements/Tube  Feeding:none  IVF:    heparin   nitroGLYCERIN Last Rate: 20 mcg/min (09/11/11 0727)  DISCONTD: heparin Last Rate: 1,000 Units/hr (09/10/11 1747)    Estimated Nutritional Needs:per day   Kcal: 1800-1900 for maintenance, 1300-1400 for weight loss Protein: 80-90 Fluid: 1.8L  Pt with a 20 # weight loss over past month due to decreased intake secondary to chest pain.  Currently NPO awaiting CATH.  NUTRITION DIAGNOSIS: Morbid Obesisty  RELATED TO: hx of excessive energy intake  AS EVIDENCE BY: BMI 48  MONITORING/EVALUATION(Goals): Tolerate heart healthy diet with good intake.  Slow weight loss appropriate.  EDUCATION NEEDS: -Education not appropriate at this time  INTERVENTION: Monitor diet tolerance/intake  Dietitian [phone]  DOCUMENTATION CODES Per approved criteria  -Morbid Obesity    Jeoffrey Massed 09/11/2011, 11:13 AM

## 2011-09-11 NOTE — Progress Notes (Signed)
Patient ID: Holly Kim, female   DOB: 01/18/1941, 70 y.o.   MRN: [id number] [facility]  SUBJECTIVE:Patient continues to have chest pain radiating down her left arm.  She is wheezing and there is a pleuritic component.  Her chest wall is tender (chronically).  She is short of breath.  Morphine helps the pain, hard to tell if NTG helps much.  Troponin rose to 1.91.     Filed Vitals:   09/11/11 0442 09/11/11 0500 09/11/11 0600 09/11/11 0746  BP: 144/49 139/50 133/56 132/66  Pulse: 70 66 66 69  Temp: 98.2 F (36.8 C)   98.4 F (36.9 C)  TempSrc: Oral   Oral  Resp: 21 21 22 16  Height:      Weight:      SpO2: 100% 100% 100% 100%    Intake/Output Summary (Last 24 hours) at 09/11/11 0826 Last data filed at 09/11/11 0700  Gross per 24 hour  Intake    915 ml  Output   1100 ml  Net   -185 ml    LABS: Basic Metabolic Panel:  Basename 09/10/11 0837  NA 139  K 4.3  CL 96  CO2 37*  GLUCOSE 312*  BUN 21  CREATININE 1.03  CALCIUM 9.4  MG --  PHOS --   Liver Function Tests: No results found for this basename: AST:2,ALT:2,ALKPHOS:2,BILITOT:2,PROT:2,ALBUMIN:2 in the last 72 hours No results found for this basename: LIPASE:2,AMYLASE:2 in the last 72 hours CBC:  Basename 09/10/11 0837  WBC 11.4*  NEUTROABS 10.1*  HGB 8.6*  HCT 31.0*  MCV 86.6  PLT 146*   Cardiac Enzymes:  Basename 09/11/11 0405 09/10/11 2237 09/10/11 1215  CKTOTAL 54 67 48  CKMB 7.8* 9.7* 6.3*  CKMBINDEX -- -- --  TROPONINI 1.91* 1.43* 0.74*   BNP:  Basename 09/10/11 0837  POCBNP 1603.0*      . aspirin      . aspirin  324 mg Oral Once  . aspirin EC  81 mg Oral Daily  . clopidogrel  75 mg Oral Daily  . docusate sodium  100 mg Oral BID  . ferrous sulfate  325 mg Oral Daily  . furosemide  40 mg Intravenous BID  . heparin  4,000 Units Intravenous Once  . insulin aspart  0-15 Units Subcutaneous TID WC  . insulin aspart  5 Units Subcutaneous TID WC  . insulin glargine  30 Units  Subcutaneous Q0700  . ipratropium  0.5 mg Nebulization Q4H  . levalbuterol  0.63 mg Nebulization Q6H  . lisinopril  5 mg Oral Daily  . metoCLOPramide  5 mg Oral QID  . metoprolol tartrate  25 mg Oral BID  .  morphine injection  2 mg Intravenous NOW  . morphine      .  morphine injection  4 mg Intravenous Once  .  morphine injection  4 mg Intravenous Once  .  morphine injection  4 mg Intravenous Once  . multivitamins ther. w/minerals  1 tablet Oral Daily  . nystatin   Topical BID  . nystatin cream  1 application Topical BID  . ondansetron  4 mg Intravenous Once  . pantoprazole  40 mg Oral Q1200  . rosuvastatin  40 mg Oral Daily  . senna  3 tablet Oral Daily  . sodium chloride  3 mL Intravenous Q12H  . sucralfate  1 g Oral TID AC  . DISCONTD: aspirin  81 mg Oral Daily  . DISCONTD: heparin  5,000 Units Subcutaneous Q8H  .   DISCONTD:  morphine injection  4 mg Intravenous Once  . DISCONTD:  morphine injection  4 mg Intravenous Once  . DISCONTD: nitroGLYCERIN  0.5 inch Topical Q6H  . DISCONTD: nystatin   Topical BID  . DISCONTD: Senior Multivitamin Plus  1 tablet Oral Daily  . DISCONTD: senna  3 tablet Oral Daily  . DISCONTD: torsemide  20 mg Oral BID  heparin gtt NTG gtt at 20  RADIOLOGY: Dg Chest 2 View  09/10/2011  *RADIOLOGY REPORT*  Clinical Data: Chest pain, shortness of breath.  CHEST - 2 VIEW  Comparison: 06/29/2011  Findings: There is cardiomegaly with vascular congestion. Peribronchial thickening.  No overt edema.  No confluent opacities or effusions.  Persistent left basilar airspace opacity.  No definite effusions.  No acute bony abnormality.  IMPRESSION: Cardiomegaly with vascular congestion and chronic peribronchial thickening.  Chronic left basilar opacity, presumably atelectasis.  Original Report Authenticated By: KEVIN G. DOVER, M.D.    PHYSICAL EXAM General: NAD, obese Neck: Thick, JVP 8-9 cm, no thyromegaly or thyroid nodule.  Lungs: Bilateral end expiratory  wheezes CV: Nondisplaced PMI.  Heart regular S1/S2, no S3/S4, no murmur.  1+ ankle edema bilaterally.  No carotid bruit.   Abdomen: Soft, nontender, no hepatosplenomegaly, no distention.  Neurologic: Alert and oriented x 3.  Psych: Normal affect. Extremities: No clubbing or cyanosis.   TELEMETRY: Reviewed telemetry pt in NSR:  ASSESSMENT AND PLAN: 70 yo obese woman with COPD on home oxygen, prior PE/DVT, CAD with history of RCA PCI in 3/11, diastolic CHF, and chronic chest wall pain presented with worsening of her baseline chest pain as well as increased dyspnea.   1. CAD: History of RCA PCI in 3/11.  She continues to have chest pain.  There is some chest wall tenderness.  There is a pleuritic component and she is wheezing.  Her troponin has risen to 1.91 with elevated CKMB.  She will need a left heart cath this morning (use radial access).  If this is unremarkable, will need V/Q scan to assess for PE.  Continue ASA, Plavix, heparin gtt, statin, ACEI, beta blocker.  Needs BMET this am before cath.  2. COPD: Patient is wheezing on exam, has history of COPD.  She also appears volume overloaded on exam, so wheezing could be due to COPD or pulmonary edema.  I am going to start her on atrovent and levalbuterol nebs, should get these soon.  3. CHF: History of diastolic CHF.  Suspect acute on chronic diastolic CHF exacerbation.  She appears volume overloaded on exam.  Will change diuretic to Lasix 40 mg IV bid for now and assess response.    Dalton McLean 09/11/2011 8:34 AM  

## 2011-09-11 NOTE — Op Note (Signed)
Cardiac Catheterization Procedure Note  Name: Holly Kim MRN: [id number] DOB: 1940/12/25  Procedure: Left Heart Cath, Selective Coronary Angiography, LV angiography, PTCA of the proximal and ostial left circumflex without stent placement.  Indication: This is a 70 year old female with multiple chronic medical conditions including coronary artery disease as well as advanced COPD and morbid obesity. She presented with atypical chest pain and was found to have a small non-ST elevation myocardial infarction. Cardiac catheterization was recommended.  Procedural Details:  The right wrist was prepped, draped, and anesthetized with 1% lidocaine. The pulse was very weak on the right side. There was blood return but I could not advance the wire after multiple attempts. Thus, we switched to the left wrist area.  Using the modified Seldinger technique, a 5 French sheath was introduced into the left radial artery. 3 mg of verapamil was administered through the sheath, weight-based unfractionated heparin was administered intravenously. A TIG catheter was used for  selective coronary angiography.  a pigtail catheter was used for left ventriculography. Catheter exchanges were performed over an exchange length guidewire.  PROCEDURAL FINDINGS Hemodynamics: AO 138/60  LV 139/17. EDP was 26.   Coronary angiography: Coronary dominance: right  Left mainstem: The vessel is normal in size and mildly calcified. There is a 40% distal stenosis at the bifurcation.   Left anterior descending (LAD): The vessel is heavily calcified in the proximal and midsegment. There is diffuse 30% disease proximally. It appears that there is a stent in the midsegment which is patent with minimal restenosis. There is mild diffuse disease distally followed by a 70% stenosis close to the apex. First diagonal is small in size with 90% proximal disease. Second diagonal is normal in size with 80% diffuse proximal disease. Her diagonal  is very small in size.   Left circumflex (LCx): The vessel is normal in size overall. It's moderately calcified in the proximal segment. There is an 80% ostial stenosis. In the proximal segment there is a 90% stenosis. There are 2 filling defects suggestive of a thrombus versus calcifications. The stent is noted distal to the stenosis which appears to be patent without significant restenosis. The rest of the vessel is free of significant disease.   Right coronary artery (RCA): The vessel is normal in size and dominant. It's moderately calcified in the proximal and midsegment. The stent is noted in the proximal area extending all the way back to the ostium there is 50% in-stent restenosis. After the stent there is diffuse 30% disease. There is a 40-50% stenosis in the distal vessel after a large acute marginal branch. The rest of the vessel has mild atherosclerosis without obstructive disease   Left ventriculography: Left ventricular systolic function is low normal, LVEF is estimated at 50%, there is no significant mitral regurgitation   PCI Note:  Following the diagnostic procedure, the decision was made to proceed with PCI.  I decided to perform balloon angioplasty without stent placement due to ostial stenosis of the left circumflex with distal left main disease. Placing a stent in the left circumflex is likely not possible without compromising the flow in the LAD. The radial sheath was upsized to a 6 [place]. Weight-based bivalirudin was given for anticoagulation. Once a therapeutic ACT was achieved, a 6 [place] XB 3.5  guide catheter was inserted.  A pro water  coronary guidewire was used to cross the lesion.  The lesion was predilated with a 3.0 x 15 mm emerge compliant  Balloon to 8 atmospheres in  the proximal left circumflex and 10 atmosphere at the ostial lesion.   Following PCI, there was 10% residual stenosis in the proximal left circumflex and 40% residual stenosis at the ostium  and TIMI-3 flow.   The patient tolerated the procedure well. There were no immediate procedural complications. There was significant difficulty in removing the radial sheath due to suspected spasm. 3 mg of verapamil was given through the sheath and she was also given 100 mcg fentanyl intravenously. A TR band was used for radial hemostasis. The patient was transferred to the post catheterization recovery area for further monitoring.  PCI Data: Vessel - left circumflex /Segment - ostial and proximal  Percent Stenosis (pre)  80% - 90%  TIMI-flow 3  Stent : No stent was placed.  Percent Stenosis (post) 40% in the ostium and 10% in the proximal segment  TIMI-flow (post) 3.   Final Conclusions:   1. Significant three-vessel coronary artery disease. Moderate instent restenosis in the right coronary artery. Significant diagonal disease and distal LAD. Severe proximal disease in the left circumflex extending to the ostium with possible thrombus. This seems to be the culprit for non-ST elevation myocardial infarction.   2. Low normal LV systolic function with moderately elevated left ventricular end-diastolic pressure. 3. Successful angioplasty without stent placement to the proximal and ostial left circumflex.  Recommendations:  I recommend aggressive therapy of risk factors. Continue dual antiplatelet therapy for at least one month and ideally lifelong due to multiple stents. If the patient develops restenosis in the left circumflex, she might need a dedicated bifurcation stenting of the left main and left circumflex. She she is not a candidate for coronary artery bypass graft surgery.   Lorine Bears 09/11/2011, 5:04 PM

## 2011-09-11 NOTE — Progress Notes (Signed)
ANTICOAGULATION CONSULT NOTE - Follow Up Consult  Pharmacy Consult for heparin Indication: chest pain/ACS  Allergies  Allergen Reactions  . Ibuprofen     REACTION: "mouth swells up and I can't swallow good"  . Amlodipine Besylate     REACTION: hypotension  . Amoxicillin     REACTION: itching  . Codeine Itching  . Cortisone Other (See Comments)    Pt states she gets "blood poisoning where they give me the shots of cortisone"  . Haloperidol Lactate     REACTION: My head gets places before my body  . Naproxen     REACTION: rash    Patient Measurements: Height: 5\' 5"  (165.1 cm) Weight: 290 lb (131.543 kg) IBW/kg (Calculated) : 57  Labs:  Basename 09/11/11 0827 09/11/11 0405 09/10/11 2237 09/10/11 0837  HGB 8.1* -- -- 8.6*  HCT 29.3* -- -- 31.0*  PLT 134* -- -- 146*  APTT -- -- -- --  LABPROT -- -- -- 13.7  INR -- -- -- 1.03  HEPARINUNFRC 0.12* -- -- --  CREATININE -- -- -- 1.03  CKTOTAL 49 54 67 --  CKMB 6.8* 7.8* 9.7* --  TROPONINI 1.65* 1.91* 1.43* --   Estimated Creatinine Clearance: 67.7 ml/min (by C-G formula based on Cr of 1.03).  Assessment: HL  0.12 on 1000 unit/hr.  HL below goal. No bleeding reported. Pt with increasing cardiac enzymes and cont chest pain. Goal of Therapy:  Heparin level 0.3-0.7 units/ml   Plan:  Heparin bolus 3000 units, increase to 1400 units/hr, check 6 hr HL if pt not at cath lab   Len Childs T 09/11/2011,9:55 AM

## 2011-09-11 NOTE — Clinical Documentation Improvement (Signed)
RESPIRATORY FAILURE DOCUMENTATION CLARIFICATION QUERY   THIS DOCUMENT IS NOT A PERMANENT PART OF THE MEDICAL RECORD  Please update your documentation within the medical record to reflect your response to this query. See instructions below for help in responding to this Query.                                                                                    09/11/11  Dear Dr.McLean Marton Redwood,  In a better effort to capture your patient's severity of illness, reflect appropriate length of stay and utilization of resources, a review of the patient medical record has revealed the following indicators.    Based on your clinical judgment, please clarify and document in a progress note and/or discharge summary the clinical condition associated with the following supporting information:  In responding to this query please exercise your independent judgment.  The fact that a query is asked, does not imply that any particular answer is desired or expected.  Possible Clinical Conditions?  _______Acute Respiratory Failure _______Acute on Chronic Respiratory Failure _______Chronic Respiratory Failure _______Other Condition________________ _______Cannot Clinically Determine    Supporting Information:  Risk Factors: COPD home O2 dependent 4L, suspect Acute on Chronic Diastolic CHF vs COPD/asthma  exacerbation Signs&Symptoms: Wheezing, "moderate distress with mild supraclavicular retractions", anxious,   Diagnostics: ABG's     PO2: 81.9     PCO2: 69.9     PH: 7.346     BiCarb: 37.2     Date of ABG's:  09/10/11     O2 concentration: 4L [place] [facility]: No edema or effusions. Treatment: Nebs: Atrovent, Xopenex, Lasix 40mg  IV bid Oxygen: O2 [place]    You may use possible, probable, or suspect with inpatient documentation. possible, probable, suspected diagnoses MUST be documented at the time of discharge  Reviewed:  no additional documentation provided  Thank You,  Sincerely, Beverley Fiedler RN   Clinical Documentation Specialist: Cell: [phone]  Health Information Management [place]  TO RESPOND TO THE THIS QUERY, FOLLOW THE INSTRUCTIONS BELOW:  1. If needed, update documentation for the patient's encounter via the notes activity.  2. Access this query again and click edit on the [employer].  3. After updating, or not, click F2 to complete all highlighted (required) fields concerning your review. Select "additional documentation in the medical record" OR "no additional documentation provided".  4. Click Sign note button.  5. The deficiency will fall out of your InBasket *Please let us know if you are not able to compete this workflow by phone or e-mail (listed below).

## 2011-09-11 NOTE — H&P (Signed)
I have seen and examined this patient. I have discussed with Dr Madolyn Frieze.  I agree with their findings and plans as documented in their admission note.  Acute Issues 1. Possible Acute Coronary Syndrome - Rising Troponin I serum levels with left precordial pain with radiation into left arm.  Pt reports her usual chest pain is in her right chest side.  -EKG (09/10/11, compared to 12/10/10): Possible new ST depression 1 mm inferior leads, V3 - V6, new TWI aVl, I, V2.   2.  Possible Acute Decompensated heart Failure in patient with #1 and hx of diastolic HF. - Vascular congestion on poor inspiration CXR, pro-BNP 1600, Increase paCO2 ~60 mm Hg.   Plan: Heprin IV, Aspirin/Plavix, continue beta-blocker at home dose. NTG IV drip, Morphine IV for pain, Cardiology consult for consideration of cardiac catheterization.  IV Lasix 40 mg BID.

## 2011-09-11 NOTE — Progress Notes (Signed)
Spoke with Bjorn Loser with [facility] regarding this patient's rising troponins.  Patient continues to complain of chest pain that is 10/10 but improves to 7-8/10 with morphine. Her subjective evaluation of the pain is somewhat unreliable. She has a history of chronic pain. She reports her pain is 10/10 when initially asked but when reminded she received her morphine about 30 minutes ago, she then says her pain is actually 8/10. Pain is still still easily reproducible.   A/P: Cardiology recommendations -Will see her soon this morning -Continue heparin and nitro gtt -Will make her NPO in case she does get catheterized -Continue morphine prn, metoprolol, lisinopril, ASA/Plavix  Update: Just informed by nurse that Cardiologist will take her to catheterization lab today.

## 2011-09-11 NOTE — Progress Notes (Signed)
Patient Holly Kim, 70 year old female is suffering with heart problems, and is about to go in for a cath lab procedure. She feels anxiety about the event.  Her concern is not with her soul, for she already  feels "right with God."  Patient just "does not want anything to go wrong" in the cath lab.  Chaplain provided pastoral prayer, conversation, and presence.  Patient expressed appreciation for Chaplain's presence.  Will follow-up as needed.

## 2011-09-11 NOTE — Progress Notes (Signed)
FMTS Attending Daily Note: Holly Solorio MD [phone] pager office [phone] I have discussed this patient with the resident and reviewed the assessment and plan as documented above. I agree wit the resident's findings and plan.  

## 2011-09-11 NOTE — Progress Notes (Signed)
CRITICAL VALUE ALERT  Critical value received:  Trop 1.43 mb 9.7 Date of notification:  09/11/11 Time of notification:  0100 Critical value read back:yes  Nurse who received alert:  Adria Dill rn MD notified (1st page):  Lucianne [place] Time of first page:  0115 doctor called to unit MD notified (2nd page):  Time of second page:  Responding MD:  Priscella Mann Time MD responded:  [phone]

## 2011-09-12 ENCOUNTER — Ambulatory Visit: Payer: Medicare Other | Admitting: Family Medicine

## 2011-09-12 ENCOUNTER — Other Ambulatory Visit: Payer: Self-pay

## 2011-09-12 DIAGNOSIS — I214 Non-ST elevation (NSTEMI) myocardial infarction: Secondary | ICD-10-CM

## 2011-09-12 LAB — CBC
HCT: 26.8 % — ABNORMAL LOW (ref 36.0–46.0)
MCHC: 28.4 g/dL — ABNORMAL LOW (ref 30.0–36.0)
Platelets: 127 10*3/uL — ABNORMAL LOW (ref 150–400)
RDW: 14.6 % (ref 11.5–15.5)

## 2011-09-12 LAB — GLUCOSE, CAPILLARY
Glucose-Capillary: 171 mg/dL — ABNORMAL HIGH (ref 70–99)
Glucose-Capillary: 219 mg/dL — ABNORMAL HIGH (ref 70–99)
Glucose-Capillary: 179 mg/dL — ABNORMAL HIGH (ref 70–99)
Glucose-Capillary: 112 mg/dL — ABNORMAL HIGH (ref 70–99)

## 2011-09-12 LAB — BASIC METABOLIC PANEL
BUN: 14 mg/dL (ref 6–23)
Creatinine, Ser: 0.93 mg/dL (ref 0.50–1.10)
GFR calc Af Amer: 69 mL/min — ABNORMAL LOW (ref >90)
GFR calc non Af Amer: 60 mL/min — ABNORMAL LOW (ref >90)
Potassium: 3.7 mEq/L (ref 3.5–5.1)

## 2011-09-12 MED ORDER — BISOPROLOL FUMARATE 5 MG PO TABS
5.0000 mg | ORAL_TABLET | Freq: Every day | ORAL | Status: DC
  Administered 2011-09-12 – 2011-09-14 (×3): 5 mg via ORAL
  Filled 2011-09-12 (×4): qty 1

## 2011-09-12 MED ORDER — ROSUVASTATIN CALCIUM 40 MG PO TABS
40.0000 mg | ORAL_TABLET | Freq: Every day | ORAL | Status: DC
  Administered 2011-09-12 – 2011-09-13 (×2): 40 mg via ORAL
  Filled 2011-09-12 (×5): qty 1

## 2011-09-12 MED ORDER — IPRATROPIUM BROMIDE 0.02 % IN SOLN
0.5000 mg | Freq: Four times a day (QID) | RESPIRATORY_TRACT | Status: DC
  Administered 2011-09-12 – 2011-09-14 (×8): 0.5 mg via RESPIRATORY_TRACT
  Filled 2011-09-12 (×8): qty 2.5

## 2011-09-12 MED ORDER — POTASSIUM CHLORIDE 20 MEQ/15ML (10%) PO LIQD
40.0000 meq | Freq: Once | ORAL | Status: AC
  Administered 2011-09-12: 40 meq via ORAL
  Filled 2011-09-12 (×2): qty 30

## 2011-09-12 MED ORDER — LEVALBUTEROL HCL 0.63 MG/3ML IN NEBU
0.6300 mg | INHALATION_SOLUTION | Freq: Four times a day (QID) | RESPIRATORY_TRACT | Status: DC
  Administered 2011-09-12 – 2011-09-14 (×8): 0.63 mg via RESPIRATORY_TRACT
  Filled 2011-09-12 (×15): qty 3

## 2011-09-12 MED FILL — Dextrose Inj 5%: INTRAVENOUS | Qty: 50 | Status: AC

## 2011-09-12 NOTE — Progress Notes (Signed)
FMTS Attending Daily Note: Holly Amrhein MD [phone] pager office [phone] I  have seen and examined this patient, reviewed their chart. I have discussed this patient with the resident. I agree with the resident's findings, assessment and care plan. 

## 2011-09-12 NOTE — Progress Notes (Signed)
Cardiac Rehab [phone] Talked with pt. She states she does not walk. Pivots only. Stated she did not walk to bathroom this am. States she pivoted to w/c and was rolled to bathroom and rolled back to bed. Pt has PT consult. Pt states she has motorized wheelchair. She is not appropriate for our services due to immobility. Reviewed NTG use and importance of taking plavix daily. We are signing off. Kordelia Severin DunlapRN

## 2011-09-12 NOTE — Progress Notes (Signed)
Pt requested not to be woken up for treatments, RT changed treatment frequency to QID per RT assessment.

## 2011-09-12 NOTE — Progress Notes (Signed)
Daily Progress Note Holly Kim. Holly Kim, M.D., M.B.A  Family Medicine PGY-1 Pager [phone]  Subjective: Patient is 1 day s/p cardiac catheterization for NSTEMI (read below for results) - no chest pain, still short of breath, abdominal pain in RUQ that she blames on chronic gall stone, tolerating PO well,   Objective: Vital signs in last 24 hours: Temp:  [98.1 F (36.7 C)-98.7 F (37.1 C)] 98.5 F (36.9 C) (11/29 0526) Pulse Rate:  [58-66] 65  (11/29 0526) Resp:  [13-24] 19  (11/29 0526) BP: (72-145)/(18-56) 131/44 mmHg (11/29 0526) SpO2:  [100 %] 100 % (11/29 0526) FiO2 (%):  [4 %] 4 % (11/28 1809) Weight:  [276 lb (125.193 kg)-285 lb (129.275 kg)] 276 lb (125.193 kg) (11/29 0526) Weight change: -5 lb (-2.268 kg) Last BM Date: 09/09/11  Intake/Output from previous day: 11/28 0701 - 11/29 0700 In: 502 [P.O.:420; I.V.:82] Out: 3175 [Urine:3175] Intake/Output this shift:    Gen: alter, oriented, obese, moderate distress,  HEENT: edentulous, hirsutism  Cardiac: RRR, distant heart sounds Lungs: crackles in lower lobes bilaterally, minimal expiratory wheezes Abdomen: normoactive bowel sounds, TTP in RUQ Extremities: 2+ edema in LE bilat, exquisite tenderness in left lower extremity distal to knee - Holly Kim claims this is baseline pain   Lab Results:  [facility] 09/12/11 0420 09/11/11 1908  WBC 6.4 6.3  HGB 7.6* 7.5*  HCT 26.8* 26.3*  PLT 127* 121*   CBG (last 3)   Basename 09/12/11 0732 09/11/11 1954 09/11/11 1152  GLUCAP 171* 183* 218*     BMET  Basename 09/12/11 0420 09/11/11 1908 09/11/11 0827  NA 138 -- 140  K 3.7 -- 4.3  CL 94* -- 95*  CO2 39* -- 39*  GLUCOSE 138* -- 278*  BUN 14 -- 15  CREATININE 0.93 0.93 --  CALCIUM 8.6 -- 8.9    Studies/Results: Cardiac Cath Results: 09/11/11  Final Conclusions:  1. Significant three-vessel coronary artery disease. Moderate instent restenosis in the right coronary artery. Significant diagonal disease and distal LAD.  Severe proximal disease in the left circumflex extending to the ostium with possible thrombus. This seems to be the culprit for non-ST elevation myocardial infarction.  2. Low normal LV systolic function with moderately elevated left ventricular end-diastolic pressure.  3. Successful angioplasty without stent placement to the proximal and ostial left circumflex.   Medications:  I have reviewed the patient's current medications. Scheduled:    . aspirin      . aspirin  81 mg Oral Daily  . bisoprolol  5 mg Oral Daily  . bivalirudin      . clopidogrel      . clopidogrel  75 mg Oral Daily  . docusate sodium  100 mg Oral BID  . enoxaparin  40 mg Subcutaneous Q24H  . fentaNYL      . fentaNYL      . ferrous sulfate  325 mg Oral Daily  . furosemide  40 mg Intravenous BID  . heparin  3,000 Units Intravenous Once  . heparin      . insulin aspart  0-15 Units Subcutaneous TID WC  . insulin aspart  5 Units Subcutaneous TID WC  . insulin glargine  30 Units Subcutaneous Q0700  . ipratropium  0.5 mg Nebulization QID  . levalbuterol  0.63 mg Nebulization QID  . lidocaine      . lisinopril  5 mg Oral Daily  . metoCLOPramide  5 mg Oral QID  . midazolam      . midazolam      .  multivitamins ther. w/minerals  1 tablet Oral Daily  . nitroGLYCERIN      . nystatin   Topical BID  . nystatin cream  1 application Topical BID  . pantoprazole  40 mg Oral Q1200  . potassium chloride  40 mEq Oral Once  . senna  3 tablet Oral Daily  . sodium chloride  3 mL Intravenous Q12H  . sucralfate  1 g Oral TID AC  . verapamil       sodium chloride, acetaminophen, acetaminophen, HYDROcodone-acetaminophen, morphine injection, nitroGLYCERIN, ondansetron (ZOFRAN) IV, ondansetron, sodium chloride, DISCONTD: sodium chloride   Assessment/Plan: This is a 70 YO Caucasian female with a significant cardiac history (CAD s/p stents 2011, grade 1 diastolic HF, history of PE/DVT) and pulmonary history (COPD on 4L O2 at home,  asthma) who presented with a NSTEMI.  Chest pain  - PCI and angioplasty of left circumflex on 11/28  -Continue ASA, Plavix, ACEI,  - Cardiology has initiated high dose statin - rosuvastatin, but switch to Lipitor 80 mg daily at d/c and beta blocker to more B1-selective bisoprolol given COPD.  Acute on chronic pain  - Norco and morphine PRN  - get PT consult today for limited mobility  Dyspnea 2/2 CHF exacerbation versus COPD/asthma exacerbation  -Unchanged dyspnea. Cards is changing her diuretic from torsemide PO to Lasix 40 IV bid and adding atrovent/levalbuterol nebs   Anemia - Hb 7.5 today, with baseline about 8.5, mildly decreased, but no transfusion warranted today per cardiology recommendations.   HTN  -See above   HLD  -see above   DM  -Lantus 30 U   -SSI   GERD  -Protonix 40 mg po qd   Renal  -Will f/u urine culture   History of gastroparesis  -Continue home Reglan and sucralfate   Psych: history of anxiety, depression, borderline personality disorder Not on home meds. BDZ recently discontinued per patient.  -Will monitor for now. Avoid BDZ due to respiratory issues.   Social Patient with financial concerns about paying for hospital bills and living expenses. SW has been assisting with the patient's living situation.  FEN/GI IVF @ [facility]. Carb modified diet   PPx Heparin SQ tid for DVT PPx.  Dispo Pending clinical improvement.      LOS: 2 days   Holly Kim 09/12/2011, 8:42 AM

## 2011-09-12 NOTE — Progress Notes (Signed)
Clinical Child psychotherapist (CSW) completed psychosocial assessment which can be found in pt chart. CSW is familiar with this pt as she works with her at [facility]. Currently pt lives with her son in a home however they struggle every month to pay the bills and the rent. CSW has been working in finding pt income based housing for the past 2 months. CSW informed pt she is number two on a waiting list for income based housing and there is a possibility that pt will receive senior housing before Christmas. CSW asked pt if she would be open to an ALF/SNF in case she needs a higher level of care or the housing does not fall through. Pt stated she would be agreeable to [facility] nursing facility if she is unable to get housing in December.   CSW will continue to follow in outpatient. [name], MSW, Theresia Majors [phone]

## 2011-09-12 NOTE — Progress Notes (Signed)
Patient ID: Holly Kim, female   DOB: Oct 27, 1940, 70 y.o.   MRN: [id number] [facility] Cardiology  SUBJECTIVE:Chest pain is better with PTCA of CFx but patient still reports some mild central chest pain. Wheezing has subsided.  Patient was very short of breath walking to bathroom this am.   Filed Vitals:   09/11/11 2223 09/11/11 2311 09/12/11 0001 09/12/11 0526  BP:  126/36 139/43 131/44  Pulse:  66 64 65  Temp:   98.2 F (36.8 C) 98.5 F (36.9 C)  TempSrc:   Oral Oral  Resp:   15 19  Height:      Weight:    125.193 kg (276 lb)  SpO2: 100%  100% 100%    Intake/Output Summary (Last 24 hours) at 09/12/11 0809 Last data filed at 09/12/11 0530  Gross per 24 hour  Intake    496 ml  Output   3175 ml  Net  -2679 ml    LABS: Basic Metabolic Panel:  Basename 09/12/11 0420 09/11/11 1908 09/11/11 0827  NA 138 -- 140  K 3.7 -- 4.3  CL 94* -- 95*  CO2 39* -- 39*  GLUCOSE 138* -- 278*  BUN 14 -- 15  CREATININE 0.93 0.93 --  CALCIUM 8.6 -- 8.9  MG -- -- --  PHOS -- -- --   Liver Function Tests: No results found for this basename: AST:2,ALT:2,ALKPHOS:2,BILITOT:2,PROT:2,ALBUMIN:2 in the last 72 hours No results found for this basename: LIPASE:2,AMYLASE:2 in the last 72 hours CBC:  Basename 09/12/11 0420 09/11/11 1908 09/10/11 0837  WBC 6.4 6.3 --  NEUTROABS -- -- 10.1*  HGB 7.6* 7.5* --  HCT 26.8* 26.3* --  MCV 85.1 84.8 --  PLT 127* 121* --   Cardiac Enzymes:  Basename 09/11/11 0827 09/11/11 0405 09/10/11 2237  CKTOTAL 49 54 67  CKMB 6.8* 7.8* 9.7*  CKMBINDEX -- -- --  TROPONINI 1.65* 1.91* 1.43*   BNP:  Basename 09/10/11 0837  POCBNP 1603.0*      . aspirin      . aspirin  81 mg Oral Daily  . bisoprolol  5 mg Oral Daily  . bivalirudin      . clopidogrel      . clopidogrel  75 mg Oral Daily  . docusate sodium  100 mg Oral BID  . enoxaparin  40 mg Subcutaneous Q24H  . fentaNYL      . fentaNYL      . ferrous sulfate  325 mg Oral Daily  . furosemide   40 mg Intravenous BID  . heparin  3,000 Units Intravenous Once  . heparin      . insulin aspart  0-15 Units Subcutaneous TID WC  . insulin aspart  5 Units Subcutaneous TID WC  . insulin glargine  30 Units Subcutaneous Q0700  . ipratropium  0.5 mg Nebulization QID  . levalbuterol  0.63 mg Nebulization QID  . lidocaine      . lisinopril  5 mg Oral Daily  . metoCLOPramide  5 mg Oral QID  . midazolam      . midazolam      . multivitamins ther. w/minerals  1 tablet Oral Daily  . nitroGLYCERIN      . nystatin   Topical BID  . nystatin cream  1 application Topical BID  . pantoprazole  40 mg Oral Q1200  . senna  3 tablet Oral Daily  . sodium chloride  3 mL Intravenous Q12H  . sucralfate  1 g Oral TID AC  .  verapamil      . verapamil      . DISCONTD: aspirin  324 mg Oral Pre-Cath  . DISCONTD: aspirin  324 mg Oral Pre-Cath  . DISCONTD: aspirin EC  81 mg Oral Daily  . DISCONTD: clopidogrel  75 mg Oral Pre-Cath  . DISCONTD: diazepam  5 mg Oral On Call  . DISCONTD: ipratropium  0.5 mg Nebulization Q4H  . DISCONTD: ipratropium  0.5 mg Nebulization Q6H  . DISCONTD: levalbuterol  0.63 mg Nebulization Q6H  . DISCONTD: metoprolol tartrate  25 mg Oral BID  . DISCONTD: rosuvastatin  40 mg Oral Daily  . DISCONTD: sodium chloride  3 mL Intravenous Q12H  . DISCONTD: torsemide  20 mg Oral BID   RADIOLOGY: Dg Chest 2 View  09/10/2011  *RADIOLOGY REPORT*  Clinical Data: Chest pain, shortness of breath.  CHEST - 2 VIEW  Comparison: 06/29/2011  Findings: There is cardiomegaly with vascular congestion. Peribronchial thickening.  No overt edema.  No confluent opacities or effusions.  Persistent left basilar airspace opacity.  No definite effusions.  No acute bony abnormality.  IMPRESSION: Cardiomegaly with vascular congestion and chronic peribronchial thickening.  Chronic left basilar opacity, presumably atelectasis.  Original Report Authenticated By: Cyndie Chime, M.D.    PHYSICAL EXAM General: NAD,  obese Neck: Thick, JVP 8-9 cm, no thyromegaly or thyroid nodule.  Lungs: Distant breath sounds, no significant wheezing.  CV: Nondisplaced PMI.  Heart regular S1/S2, no S3/S4, no murmur.  1+ ankle edema bilaterally.  No carotid bruit.   Abdomen: Soft, nontender, no hepatosplenomegaly, no distention.  Neurologic: Alert and oriented x 3.  Psych: Normal affect. Extremities: No clubbing or cyanosis.   TELEMETRY: Reviewed telemetry pt in NSR:  ASSESSMENT AND PLAN: 70 yo obese woman with COPD on home oxygen, prior PE/DVT, CAD with history of RCA PCI in 3/11, diastolic CHF, and chronic chest wall pain presented with worsening of her baseline chest pain and elevated troponin consistent wth NSTEMI as well as increased dyspnea.   1. CAD: NSTEMI.  PTCA to ostial and proximal CFx yesterday.  Chest pain better.  Continue ASA, Plavix, ACEI, needs high dose statin (will start).  Given COPD, I will change beta blocker to more B1-selective bisoprolol. 2. COPD: Xopenex and Atrovent nebs.  She is not wheezing today.  3. CHF: History of diastolic CHF.  Suspect acute on chronic diastolic CHF exacerbation.  She appears volume overloaded on exam.  She diuresed well with IV Lasix yesterday and weight is down.  Continue IV Lasix today.   4. Needs PT consult.  She is not very mobile.     Marca Ancona 09/12/2011 8:09 AM

## 2011-09-13 ENCOUNTER — Encounter: Payer: Medicare Other | Admitting: Neurosurgery

## 2011-09-13 LAB — CBC
HCT: 27.2 % — ABNORMAL LOW (ref 36.0–46.0)
MCHC: 28.7 g/dL — ABNORMAL LOW (ref 30.0–36.0)
Platelets: 128 10*3/uL — ABNORMAL LOW (ref 150–400)
RDW: 14.7 % (ref 11.5–15.5)
WBC: 6.6 10*3/uL (ref 4.0–10.5)

## 2011-09-13 LAB — GLUCOSE, CAPILLARY: Glucose-Capillary: 195 mg/dL — ABNORMAL HIGH (ref 70–99)

## 2011-09-13 LAB — BASIC METABOLIC PANEL
Chloride: 93 mEq/L — ABNORMAL LOW (ref 96–112)
GFR calc Af Amer: 59 mL/min — ABNORMAL LOW (ref >90)
GFR calc non Af Amer: 51 mL/min — ABNORMAL LOW (ref >90)
Potassium: 3.8 mEq/L (ref 3.5–5.1)
Sodium: 142 mEq/L (ref 135–145)

## 2011-09-13 MED ORDER — POTASSIUM CHLORIDE 20 MEQ/15ML (10%) PO LIQD
40.0000 meq | Freq: Once | ORAL | Status: AC
  Administered 2011-09-13: 40 meq via ORAL
  Filled 2011-09-13: qty 30

## 2011-09-13 NOTE — Progress Notes (Signed)
Patient ID: Holly Kim, female   DOB: 12-25-1940, 70 y.o.   MRN: [id number] [facility] Cardiology  SUBJECTIVE:Patient denies chest pain.  She has chronic RUQ pain that she attributes to her gallbladder and she is still short of breath.  She diuresed well and weight is down.    Filed Vitals:   09/12/11 1938 09/12/11 2157 09/13/11 0015 09/13/11 0645  BP:  127/45 141/49 121/43  Pulse:  64 60 64  Temp:  98.2 F (36.8 C) 98.6 F (37 C) 98.7 F (37.1 C)  TempSrc:  Oral Oral Oral  Resp:  16 18 16   Height:      Weight:      SpO2: 100% 100% 100% 100%    Intake/Output Summary (Last 24 hours) at 09/13/11 0801 Last data filed at 09/13/11 0742  Gross per 24 hour  Intake    483 ml  Output   2676 ml  Net  -2193 ml    LABS: Basic Metabolic Panel:  Basename 09/13/11 0500 09/12/11 0420  NA 142 138  K 3.8 3.7  CL 93* 94*  CO2 41* 39*  GLUCOSE 92 138*  BUN 14 14  CREATININE 1.07 0.93  CALCIUM 8.8 8.6  MG -- --  PHOS -- --   Liver Function Tests: No results found for this basename: AST:2,ALT:2,ALKPHOS:2,BILITOT:2,PROT:2,ALBUMIN:2 in the last 72 hours No results found for this basename: LIPASE:2,AMYLASE:2 in the last 72 hours CBC:  Basename 09/13/11 0500 09/12/11 0420 09/10/11 0837  WBC 6.6 6.4 --  NEUTROABS -- -- 10.1*  HGB 7.8* 7.6* --  HCT 27.2* 26.8* --  MCV 83.7 85.1 --  PLT 128* 127* --   Cardiac Enzymes:  Basename 09/11/11 0827 09/11/11 0405 09/10/11 2237  CKTOTAL 49 54 67  CKMB 6.8* 7.8* 9.7*  CKMBINDEX -- -- --  TROPONINI 1.65* 1.91* 1.43*   BNP:  Basename 09/10/11 0837  POCBNP 1603.0*    RADIOLOGY: Dg Chest 2 View  09/10/2011  *RADIOLOGY REPORT*  Clinical Data: Chest pain, shortness of breath.  CHEST - 2 VIEW  Comparison: 06/29/2011  Findings: There is cardiomegaly with vascular congestion. Peribronchial thickening.  No overt edema.  No confluent opacities or effusions.  Persistent left basilar airspace opacity.  No definite effusions.  No acute  bony abnormality.  IMPRESSION: Cardiomegaly with vascular congestion and chronic peribronchial thickening.  Chronic left basilar opacity, presumably atelectasis.  Original Report Authenticated By: Cyndie Chime, M.D.    PHYSICAL EXAM General: NAD, obese Neck: Thick, JVP difficult , think about 8-9 cm, no thyromegaly or thyroid nodule.  Lungs: Distant breath sounds, no significant wheezing.  CV: Nondisplaced PMI.  Heart regular S1/S2, no S3/S4, no murmur.  1+ ankle edema bilaterally.  No carotid bruit.   Abdomen: Soft, nontender, no hepatosplenomegaly, no distention.  Neurologic: Alert and oriented x 3.  Psych: Normal affect. Extremities: No clubbing or cyanosis.   TELEMETRY: Reviewed telemetry pt in NSR  ASSESSMENT AND PLAN: 70 yo obese woman with COPD on home oxygen, prior PE/DVT, CAD with history of RCA PCI in 3/11, diastolic CHF, and chronic chest wall pain presented with worsening of her baseline chest pain and elevated troponin consistent wth NSTEMI as well as increased dyspnea.   1. CAD: NSTEMI.  PTCA to ostial and proximal CFx yesterday.  Chest pain better.  Continue ASA, Plavix, ACEI, Crestor 40, bisoprolol. 2. COPD: Xopenex and Atrovent nebs.  She is not wheezing today.   3. CHF: History of diastolic CHF.  Suspect acute on chronic diastolic  CHF exacerbation.  Exam is difficult but still short of breath, suspect still volume overloaded.  She diuresed well with IV Lasix yesterday and weight is down.  Continue IV Lasix today.      Marca Ancona 09/13/2011 8:08 AM

## 2011-09-13 NOTE — Progress Notes (Signed)
FMTS Attending Daily Note: Denny Levy MD [phone] pager office [phone] I  have seen and examined this patient, reviewed their chart. I have discussed this patient with the resident. I agree with the resident's findings, assessment and care plan. Discussed hemoglobin with cardiology and they think she is a little fluid overloaded and that this is close to her baseline hgb. They did not feel we should transfuse at this time. HEr abdominal pain is similar to her baseline that I have seen in clinic.

## 2011-09-13 NOTE — Progress Notes (Signed)
Physical Therapy Evaluation Patient Details Name: Holly Kim MRN: [id number] DOB: 06-15-1941 Today's Date: 09/13/2011  Problem List:  Patient Active Problem List  Diagnoses  . DIABETES MELLITUS, II, COMPLICATIONS  . HYPERCHOLESTEROLEMIA  . Morbid obesity  . ANEMIA, NORMOCYTIC  . ANXIETY  . BORDERLINE PERSONALITY  . DEPRESSIVE DISORDER, NOS  . TARDIVE DYSKINESIA  . NEUROPATHY, PERIPHERAL  . CAD  . DEEP VEIN THROMBOPHLEBITIS, LEG  . VENOUS INSUFFICIENCY  . CHRONIC AIRWAY OBSTRUCTION NEC  . GERD  . CONSTIPATION  . BACK PAIN, CHRONIC  . CEREBROVAS DIS, LATE EFFECTS (S/P CVA)  . FATIGUE  . CHEST PAIN, ATYPICAL  . Abdominal pain, right upper quadrant  . ADULT MALTREATMENT UNSPECIFIED NEC  . Encounter for long-term (current) use of high-risk medication  . Abdominal pain  . Blindness - both eyes  . Impaired mobility  . Diastolic dysfunction  . Encounter for palliative care  . Dyspnea  . Restrictive lung disease  . Housing or economic circumstance  . Skin tag of female perineum  . Unstable angina    Past Medical History:  Past Medical History  Diagnosis Date  . PE (pulmonary embolism)   . DVT (deep venous thrombosis)   . Diabetes mellitus   . Asthma   . Hypertension   . CAD (coronary artery disease)     s/p pci rca in past; nstemi 12/2009 with rca occlusion/isr - 2.75 x 18-mm PROMUS DES placed.  residual ostial lcx dzs  . GERD (gastroesophageal reflux disease)   . COPD (chronic obstructive pulmonary disease)   . Obesity   . Tardive dyskinesia   . History of renal failure   . Depression   . Borderline personality disorder   . Anxiety   . Anemia     hx of blood transfusion x 3   Past Surgical History:  Past Surgical History  Procedure Date  . US echocardiography 2008  . Orif ankle fracture   . Appendectomy   . Cervical discectomy   . Ptca 06/2010  . Tonsillectomy   . Transthoracic echocardiogram 06/2010    Grade 1 diastolic, EF 55-60    PT  Assessment/Plan/Recommendation PT Assessment Clinical Impression Statement: pt is a 70 y/o female adm with CP, determined to have suffered NSTEMI;  pt needs acute PT to build strength and activity tolerance and Home PT for strength , balance, activity tolerance and transfers  PT Recommendation/Assessment: Patient will need skilled PT in the acute care venue PT Problem List: Decreased strength;Decreased activity tolerance;Decreased balance;Decreased mobility;Decreased knowledge of use of DME;Pain Barriers to Discharge: Decreased caregiver support PT Therapy Diagnosis : Generalized weakness PT Plan PT Frequency: Min 3X/week PT Treatment/Interventions: DME instruction;Stair training;Functional mobility training;Balance training;Patient/family education;Other (comment) (therapeutic act.) PT Recommendation Follow Up Recommendations: Home health PT Equipment Recommended: None recommended by PT PT Goals  Acute Rehab PT Goals PT Goal Formulation: With patient Time For Goal Achievement: 7 days Pt will go Supine/Side to Sit: with supervision;with HOB not 0 degrees (comment degree);Other (comment) (as hospital bed allows) PT Goal: Supine/Side to Sit - Progress: Other (comment) Pt will Transfer Sit to Stand/Stand to Sit: Other (comment) (min guard A) PT Transfer Goal: Sit to Stand/Stand to Sit - Progress: Other (comment) Pt will Transfer Bed to Chair/Chair to Bed: Other (comment) (min guard A) PT Transfer Goal: Bed to Chair/Chair to Bed - Progress: Other (comment)  PT Evaluation Precautions/Restrictions  Precautions Precautions: Fall Restrictions Weight Bearing Restrictions: No Prior Functioning  Home Living Lives With: [facility]  From: Family;Personal care attendant (3hr/day M-F; 2hrs/day Sat/sun) Type of Home: House Home Layout: Able to live on main level with bedroom/bathroom;One level Home Access: Ramped entrance Bathroom Shower/Tub: Other (comment);Walk-in shower (uses tub  transfer bench in the shower; showers 1x/mon) Bathroom Toilet: Handicapped height Bathroom Accessibility: Yes How Accessible: Accessible via wheelchair Home Adaptive Equipment: Tub transfer bench;Hospital bed;Hand-held shower hose;Grab bars in shower;Walker - rolling;Straight cane;Wheelchair - powered;Wheelchair - manual;Other (comment) (3 powered w/c; trapeze over the bed) Prior Function Level of Independence: Independent with transfers;Needs assistance with ADLs (states unable to do well in hospital with this equipment) Able to Take Stairs?: No Cognition Cognition Arousal/Alertness: Awake/alert Overall Cognitive Status: Appears within functional limits for tasks assessed Sensation/Coordination Coordination Gross Motor Movements are Fluid and Coordinated: No Extremity Assessment RUE Assessment RUE Assessment:  (generally weak) LUE Assessment LUE Assessment:  (generally weak) RLE Assessment RLE Assessment:  (generally weak at 4/5) LLE Assessment LLE Assessment:  (generally weak at 4/5) Mobility (including Balance) Bed Mobility Bed Mobility: Yes Supine to Sit: HOB elevated (Comment degrees);4: Min assist (50+ degrees) Supine to Sit Details (indicate cue type and reason): vc's for hand placement and technique, but pt making excuses why she couldn't try Sitting - Scoot to Edge of Bed: 3: Mod assist Sitting - Scoot to Edge of Bed Details (indicate cue type and reason): vc's for technique, but pt not receptive Transfers Transfers: Yes Sit to Stand: 4: Min assist;With upper extremity assist;From bed;From chair/3-in-1 Sit to Stand Details (indicate cue type and reason): vc's for hand placement, but pt not very receptive; manual A for forward w/shift Stand to Sit: 4: Min assist Stand Pivot Transfers: 4: Min Actuary Details (indicate cue type and reason): vc's for technique and hand placement Ambulation/Gait Ambulation/Gait: No  Posture/Postural  Control Posture/Postural Control: No significant limitations (for bed mob/transfers given her adaptive equip.) Balance Balance Assessed:  (steady sitting balance; needs UE assist for stand balance) Exercise    End of Session PT - End of Session Activity Tolerance: Patient tolerated treatment well Patient left: in chair Nurse Communication: Mobility status for transfers General Behavior During Session: Other (comment) (but not receptive to new ideas)  Jessice Madill, Eliseo Gum 09/13/2011, 1:58 PM  09/13/2011  [place], PT [phone] (pager)

## 2011-09-13 NOTE — ED Provider Notes (Signed)
Medical screening examination/treatment/procedure(s) were conducted as a shared visit with non-physician practitioner(s) and myself.  I personally evaluated the patient during the encounter  This morbidly obese elderly female with multiple medical problems, including COPD requiring home oxygen use, and prior cardiac interventions now presents with chest pain and shortness of breath. On initial exam the patient is uncomfortable, though not in distress. The patient's evaluation was notable for evidence of an nstemi, with troponin greater than 0.37. The patient required multiple re evaluations, and discussions with consulting services. Given the acuity of the patient's presentation, her notable lab findings, her comorbidities, she was admitted for further evaluation and management.   Gerhard Munch, MD 09/13/11 [phone]

## 2011-09-13 NOTE — Progress Notes (Signed)
Daily Progress Note Holly Kim. Holly Kim, M.D., M.B.A  Family Medicine PGY-1 Pager [phone]  Subjective: Patient is 2 day s/p PCI of left circumflex for NSTEMI (read below for results) - patient complains of RUQ abdominal pain which is not imrpoved from baseline  - shortness of breath persistent despite diuresis of 2L yestersday; improves temporarily with nebulizer   Objective: Vital signs in last 24 hours: Temp:  [98.2 F (36.8 C)-99.6 F (37.6 C)] 98.7 F (37.1 C) (11/30 0645) Pulse Rate:  [60-78] 64  (11/30 0645) Resp:  [16-19] 16  (11/30 0645) BP: (114-141)/(43-71) 121/43 mmHg (11/30 0645) SpO2:  [100 %] 100 % (11/30 0645) Weight change:  Last BM Date: 09/12/11  Intake/Output from previous day: 11/29 0701 - 11/30 0700 In: 483 [P.O.:480; I.V.:3] Out: 2376 [Urine:2375; Stool:1] Intake/Output this shift: Total I/O In: -  Out: 300 [Urine:300]  Gen: alter, oriented, obese, moderate distress  HEENT: edentulous, hirsutism  Cardiac: RRR, distant heart sounds Lungs: no crackles, minimal expiratory wheezes Abdomen: normoactive bowel sounds, exquisite TTP in RUQ Extremities: 2+ edema in LE bilat, exquisite tenderness in left lower extremity distal to knee - shae claims this is baseline pain  Neuro: oral tardive dyskinesia, tremor of hands   Lab Results:  Basename 09/13/11 0500 09/12/11 0420  WBC 6.6 6.4  HGB 7.8* 7.6*  HCT 27.2* 26.8*  PLT 128* 127*   CBG (last 3)   Basename 09/12/11 2150 09/12/11 1715 09/12/11 1155  GLUCAP 112* 179* 219*    BMET  Basename 09/13/11 0500 09/12/11 0420  NA 142 138  K 3.8 3.7  CL 93* 94*  CO2 41* 39*  GLUCOSE 92 138*  BUN 14 14  CREATININE 1.07 0.93  CALCIUM 8.8 8.6    Studies/Results: Cardiac Cath Results: 09/11/11  Final Conclusions:  1. Significant three-vessel coronary artery disease. Moderate instent restenosis in the right coronary artery. Significant diagonal disease and distal LAD. Severe proximal disease in the  left circumflex extending to the ostium with possible thrombus. This seems to be the culprit for non-ST elevation myocardial infarction.  2. Low normal LV systolic function with moderately elevated left ventricular end-diastolic pressure.  3. Successful angioplasty without stent placement to the proximal and ostial left circumflex.   Medications:  I have reviewed the patient's current medications.     Marland Kitchen aspirin  81 mg Oral Daily  . bisoprolol  5 mg Oral Daily  . clopidogrel  75 mg Oral Daily  . docusate sodium  100 mg Oral BID  . enoxaparin  40 mg Subcutaneous Q24H  . ferrous sulfate  325 mg Oral Daily  . furosemide  40 mg Intravenous BID  . insulin aspart  0-15 Units Subcutaneous TID WC  . insulin aspart  5 Units Subcutaneous TID WC  . insulin glargine  30 Units Subcutaneous Q0700  . ipratropium  0.5 mg Nebulization QID  . levalbuterol  0.63 mg Nebulization QID  . lisinopril  5 mg Oral Daily  . metoCLOPramide  5 mg Oral QID  . multivitamins ther. w/minerals  1 tablet Oral Daily  . nystatin   Topical BID  . nystatin cream  1 application Topical BID  . pantoprazole  40 mg Oral Q1200  . potassium chloride  40 mEq Oral Once  . potassium chloride  40 mEq Oral Once  . rosuvastatin  40 mg Oral q1800  . senna  3 tablet Oral Daily  . sodium chloride  3 mL Intravenous Q12H  . sucralfate  1 g Oral TID  AC  . DISCONTD: metoprolol tartrate  25 mg Oral BID   sodium chloride, acetaminophen, acetaminophen, HYDROcodone-acetaminophen, morphine injection, nitroGLYCERIN, ondansetron (ZOFRAN) IV, ondansetron, sodium chloride   Assessment/Plan: This is a 70 YO Caucasian female with a significant cardiac history (CAD s/p stents 2011, grade 1 diastolic HF, history of PE/DVT) and pulmonary history (COPD on 4L O2 at home, asthma) who presented with a NSTEMI.  Chest pain  - CPTA of left circumflex on 11/28  -Continue ASA, Plavix, ACEI, bisoprolol, rosuvastatin (switch to Lipitor at d/c)   Acute  on chronic pain  - Norco and morphine PRN  - could not work with cardiac rehab on 11/29   Dyspnea 2/2 CHF exacerbation versus COPD/asthma exacerbation  -Unchanged dyspnea.  - continue lasix 40mg  BID and nebs treatement   - on 4L O2 via n/c  Anemia - Hb 7.6today, with baseline about 8.5, mildly decreased  HTN  -See above   HLD  -see above   DM  -Lantus 30 U   -SSI   GERD  -Protonix 40 mg po qd   Renal  -Will f/u urine culture   History of gastroparesis  -Continue home Reglan and sucralfate   Psych: history of anxiety, depression, borderline personality disorder Not on home meds. BDZ recently discontinued per patient.  -Will monitor for now. Avoid BDZ due to respiratory issues.   Social Patient with financial concerns about paying for hospital bills and living expenses. SW has been assisting with the patient's living situation.  FEN/GI IVF @ [facility]. Carb modified diet   PPx Heparin SQ tid for DVT PPx.  Dispo Pending clinical improvement of respiratory status      LOS: 3 days   Mat Carne 09/13/2011, 7:56 AM

## 2011-09-14 LAB — BASIC METABOLIC PANEL
BUN: 19 mg/dL (ref 6–23)
BUN: 19 mg/dL (ref 6–23)
CO2: 42 mEq/L (ref 19–32)
Chloride: 89 mEq/L — ABNORMAL LOW (ref 96–112)
Creatinine, Ser: 1.05 mg/dL (ref 0.50–1.10)
GFR calc Af Amer: 63 mL/min — ABNORMAL LOW (ref >90)
GFR calc non Af Amer: 52 mL/min — ABNORMAL LOW (ref >90)
Glucose, Bld: 312 mg/dL — ABNORMAL HIGH (ref 70–99)
Potassium: 4.2 mEq/L (ref 3.5–5.1)
Potassium: 3.8 mEq/L (ref 3.5–5.1)

## 2011-09-14 LAB — CBC
HCT: 28 % — ABNORMAL LOW (ref 36.0–46.0)
HCT: 27.4 % — ABNORMAL LOW (ref 36.0–46.0)
Hemoglobin: 8.2 g/dL — ABNORMAL LOW (ref 12.0–15.0)
Hemoglobin: 8 g/dL — ABNORMAL LOW (ref 12.0–15.0)
MCH: 24 pg — ABNORMAL LOW (ref 26.0–34.0)
MCHC: 29.3 g/dL — ABNORMAL LOW (ref 30.0–36.0)
MCHC: 29.2 g/dL — ABNORMAL LOW (ref 30.0–36.0)
MCV: 82.1 fL (ref 78.0–100.0)
MCV: 82 fL (ref 78.0–100.0)
RDW: 14.6 % (ref 11.5–15.5)
WBC: 6.4 10*3/uL (ref 4.0–10.5)

## 2011-09-14 MED ORDER — BISOPROLOL FUMARATE 5 MG PO TABS: 5.0000 mg | ORAL_TABLET | Freq: Every day | ORAL | Status: DC

## 2011-09-14 MED ORDER — ONDANSETRON HCL 4 MG PO TABS: 4.0000 mg | ORAL_TABLET | Freq: Four times a day (QID) | ORAL | Status: DC | PRN

## 2011-09-14 MED ORDER — NITROGLYCERIN 0.4 MG SL SUBL: 0.4000 mg | SUBLINGUAL_TABLET | SUBLINGUAL | Status: DC | PRN

## 2011-09-14 MED ORDER — MORPHINE SULFATE CR 15 MG PO TB12: 15.0000 mg | ORAL_TABLET | Freq: Three times a day (TID) | ORAL | Status: DC

## 2011-09-14 MED ORDER — THERA M PLUS PO TABS: 1.0000 | ORAL_TABLET | Freq: Every day | ORAL | Status: DC

## 2011-09-14 NOTE — Progress Notes (Signed)
Subjective:  No chest pain or SOB at rest.  Signif for RUQ pain "my gallbladder' Objective: Filed Vitals:   09/13/11 1936 09/13/11 2100 09/14/11 0619 09/14/11 0816  BP:  136/72 148/65   Pulse: 67 65 65   Temp:  98.6 F (37 C) 98.3 F (36.8 C)   TempSrc:  Oral Oral   Resp: 20 16 20    Height:      Weight:      SpO2: 97% 98% 99% 96%   Weight change:   Intake/Output Summary (Last 24 hours) at 09/14/11 0953 Last data filed at 09/14/11 0900  Gross per 24 hour  Intake    540 ml  Output   4800 ml  Net  -4260 ml    General: Alert, awake, oriented x3,   HEENT:JVP is normal Heart: Regular rate and rhythm, without murmurs, rubs, gallops.  Lungs: Relatively clear Abdomen: Obese.  RUQ tenderness is moderate Ext:  ++ edema.  Lab Results: Results for orders placed during the hospital encounter of 09/10/11 (from the past 24 hour(s))  GLUCOSE, CAPILLARY     Status: Abnormal   Collection Time   09/13/11  1:12 PM      Component Value Range   Glucose-Capillary 167 (*) 70 - 99 (mg/dL)  GLUCOSE, CAPILLARY     Status: Abnormal   Collection Time   09/13/11  4:27 PM      Component Value Range   Glucose-Capillary 195 (*) 70 - 99 (mg/dL)  GLUCOSE, CAPILLARY     Status: Abnormal   Collection Time   09/13/11 10:11 PM      Component Value Range   Glucose-Capillary 289 (*) 70 - 99 (mg/dL)   Comment 1 Notify RN     Comment 2 Documented in Chart    CBC     Status: Abnormal   Collection Time   09/14/11  5:00 AM      Component Value Range   WBC 6.5  4.0 - 10.5 (K/uL)   RBC 3.41 (*) 3.87 - 5.11 (MIL/uL)   Hemoglobin 8.2 (*) 12.0 - 15.0 (g/dL)   HCT 29.5 (*) 28.4 - 46.0 (%)   MCV 82.1  78.0 - 100.0 (fL)   MCH 24.0 (*) 26.0 - 34.0 (pg)   MCHC 29.3 (*) 30.0 - 36.0 (g/dL)   RDW 13.2  44.0 - 10.2 (%)   Platelets 128 (*) 150 - 400 (K/uL)  BASIC METABOLIC PANEL     Status: Abnormal   Collection Time   09/14/11  5:00 AM      Component Value Range   Sodium 138  135 - 145 (mEq/L)   Potassium 4.2   3.5 - 5.1 (mEq/L)   Chloride 90 (*) 96 - 112 (mEq/L)   CO2 41 (*) 19 - 32 (mEq/L)   Glucose, Bld 312 (*) 70 - 99 (mg/dL)   BUN 19  6 - 23 (mg/dL)   Creatinine, Ser 7.25  0.50 - 1.10 (mg/dL)   Calcium 9.1  8.4 - 36.6 (mg/dL)   GFR calc non Af Amer 52 (*) >90 (mL/min)   GFR calc Af Amer 60 (*) >90 (mL/min)  GLUCOSE, CAPILLARY     Status: Abnormal   Collection Time   09/14/11  7:58 AM      Component Value Range   Glucose-Capillary 285 (*) 70 - 99 (mg/dL)    Studies/Results: No results found.  Medications: I have reviewed the patient's current medications.   Patient Active Hospital Problem List:  CAD (06/25/2010)  Assessment: s/p NSTEMI.  Had PTCA to ostial an proximal LCx.  Keep on ASA, Plavix, ACEI, Crestor, bisoprolol.  Recomm dual antiplatelet for at least one month, optimally lifelong givne multiple stents.    RUQ pain  Patient reports problems with her gallbladder  WIll defer to primary teamCHRONIC AIRWAY Diastolic dysfunction (06/07/2011)   CHF   Assessment: Diuresing.  I would continue IV lasix today. Anemia  Will needs to follow closely now that on plavix and ASA   Restrictive lung disease (07/29/2011)    Assessment: Keep on inhalers.      LOS: 4 days   Dietrich Pates 09/14/2011, 9:53 AM

## 2011-09-14 NOTE — Progress Notes (Signed)
FMTS Attending Daily Note: Denny Levy MD [phone] pager office [phone] I  have seen and examined this patient, reviewed their chart. I have discussed this patient with the resident. I agree with the resident's findings, assessment and care plan. She thinks she is ready to go home and I think she is medically stable. I see no sign of fluid overload and see no reason for further diuresis. Her hemoglobin has remained stable. As she missed her pain clinic appt, we will give her enough pain meds until she can get that rescheduled outpt. Needs to travel home via ambulance as her family has no transportation per her---will discuss with SW. That may delay d/c. F/u at [facility] next week.

## 2011-09-14 NOTE — Discharge Summary (Signed)
Physician Discharge Summary  Patient ID: Holly Kim MRN: [id number] DOB/AGE: Sep 08, 1941 70 y.o.  Admit date: 09/10/2011 Discharge date: 09/14/2011  Admission Diagnoses: 1. CP with elevated troponins. 2. CAD 3.COPD 4. Diastolic CHF 5. Morbid Obesity   Discharge Diagnoses:  Principal Problem:  *Unstable angina Active Problems:  DIABETES MELLITUS, II, COMPLICATIONS  HYPERCHOLESTEROLEMIA  Morbid obesity  ANEMIA, NORMOCYTIC  ANXIETY  BORDERLINE PERSONALITY  DEPRESSIVE DISORDER, NOS  TARDIVE DYSKINESIA  NEUROPATHY, PERIPHERAL  CAD  DEEP VEIN THROMBOPHLEBITIS, LEG  VENOUS INSUFFICIENCY  CHRONIC AIRWAY OBSTRUCTION NEC  GERD  BACK PAIN, CHRONIC  CEREBROVAS DIS, LATE EFFECTS (S/P CVA)  Blindness - both eyes  Diastolic dysfunction  Restrictive lung disease   Discharged Condition: fair  Hospital Course:   70 YO Caucasian female with a significant cardiac history (CAD s/p stents 2011, grade 1 diastolic HF, history of PE/DVT) and pulmonary history (COPD on 4L O2 at home, asthma) who presented with a NSTEMI.   1. Chest pain: Patient admitted. Cardiology the patient to cardiac cath on 09/11/2011. Successful angioplasty without stent placement of the proximal and ostial left circumflex is performed. The patient was continued on ASA, Plavix, ACEI, bisoprolol, rosuvastatin. By day of discharge cardiac chest pain has resolved.   2. Dyspnea: Patient with persistent subjective dyspnea her hospitalization. She is diuresis with IV Lasix. Also hospitalization is down 16 g admission. She has significant edema throughout her hospital course but this is her baseline. Patient is on 4 L O2 via nasal cannula at home for COPD and was maintained on her baseline oxygen requirement during her hospital stay.   3. Anemia: Hb 7.8 on discharge date. As low as 7.6 during hospital stay. Discussed transfusion with cardiology. Cardiology recommended diuresis instead of tranfusion.   4. Acute on  chronic pain: Patient with baseline chronic pain and complaint of chest pain and abdominal pain during the admission. She was managed with morphine and Vicodin. She missed her visit at her pain management clinic due to this admission. She was therefore discharged home with a 30 day supply of morphine.   Consults: cardiology  Significant Diagnostic Studies:  Cardiac Cath Results: 09/11/11  Final Conclusions:  1. Significant three-vessel coronary artery disease. Moderate instent restenosis in the right coronary artery. Significant diagonal disease and distal LAD. Severe proximal disease in the left circumflex extending to the ostium with possible thrombus. This seems to be the culprit for non-ST elevation myocardial infarction.  2. Low normal LV systolic function with moderately elevated left ventricular end-diastolic pressure.  3. Successful angioplasty without stent placement to the proximal and ostial left circumflex.      Treatments: analgesia: Morphine, cardiac catheterization, diuresis with IV lasix.   Disposition: Home or Self Care  Discharge Orders    Future Appointments: Provider: Department: Dept Phone: Center:   10/03/2011 1:30 PM [facility] T[facility] [place] [facility] [phone] [facility]     Future Orders Please Complete By Expires   Diet - low sodium heart healthy      Increase activity slowly      Discharge instructions      Comments:   Advance activity slowly. Continue to take your medications as prescribed especially your diuretic (toradol).     Discharge Medication List as of 09/14/2011  3:47 PM    START taking these medications   Details  bisoprolol (ZEBETA) 5 MG tablet Take 1 tablet (5 mg total) by mouth daily., Starting 09/14/2011, Until Sun 09/13/12, Normal    !! Multiple Vitamins-Minerals (MULTIVITAMINS THER.  W/MINERALS) TABS Take 1 tablet by mouth daily., Starting 09/14/2011, Until Discontinued, Normal    nitroGLYCERIN (NITROSTAT) 0.4 MG SL tablet Place 1 tablet (0.4 mg  total) under the tongue every 5 (five) minutes as needed for chest pain., Starting 09/14/2011, Until Sun 09/13/12, Normal    ondansetron (ZOFRAN) 4 MG tablet Take 1 tablet (4 mg total) by mouth every 6 (six) hours as needed for nausea., Starting 09/14/2011, Until Sat 09/21/11, Normal     !! - Potential duplicate medications found. Please discuss with provider.    CONTINUE these medications which have CHANGED   Details  morphine (MS CONTIN) 15 MG 12 hr tablet Take 1 tablet (15 mg total) by mouth 3 (three) times daily., Starting 09/14/2011, Until Discontinued, Print      CONTINUE these medications which have NOT CHANGED   Details  albuterol (PROVENTIL) (2.5 MG/3ML) 0.083% nebulizer solution Take 3 mLs (2.5 mg total) by nebulization every 6 (six) hours as needed for wheezing. Do not use with albuterol inhaler., Starting 08/05/2011, Until Tue 08/04/12, Normal    albuterol (VENTOLIN HFA) 108 (90 BASE) MCG/ACT inhaler Inhale 2 puffs into the lungs every 6 (six) hours as needed for wheezing., Starting 08/05/2011, Until Tue 08/04/12, Normal    aspirin 81 MG EC tablet Take 1 tablet (81 mg total) by mouth daily., Starting 08/29/2011, Until Discontinued, Normal    clopidogrel (PLAVIX) 75 MG tablet Take 1 tablet (75 mg total) by mouth daily., Starting 08/05/2011, Until Discontinued, Normal    docusate sodium (COLACE) 100 MG capsule Take 1 capsule (100 mg total) by mouth 2 (two) times daily., Starting 08/05/2011, Until Discontinued, Normal    ferrous sulfate 325 (65 FE) MG tablet Take 1 tablet (325 mg total) by mouth daily., Starting 08/29/2011, Until Discontinued, Normal    insulin aspart (NOVOLOG) 100 UNIT/ML injection 16 units prior to breakfast, 20 units prior to lunch, 24 prior units to dinner, Normal    insulin glargine (LANTUS) 100 UNIT/ML injection Inject 55 Units into the skin daily., Starting 08/29/2011, Until Discontinued, Normal    lisinopril (PRINIVIL,ZESTRIL) 5 MG tablet Take 1 tablet (5 mg  total) by mouth daily., Starting 08/05/2011, Until Discontinued, Normal    metoCLOPramide (REGLAN) 5 MG tablet Take 1 tablet (5 mg total) by mouth 4 (four) times daily., Starting 08/05/2011, Until Discontinued, Normal    !! Multiple Vitamins-Minerals (SENIOR MULTIVITAMIN PLUS) TABS Take 1 tablet by mouth daily., Starting 08/05/2011, Until Discontinued, Normal    nystatin (MYCOSTATIN) powder Apply topically 2 (two) times daily. Apply to affected area , Starting 08/12/2011, Until Discontinued, Print    nystatin cream (MYCOSTATIN) Apply topically 2 (two) times daily., Starting 08/29/2011, Until Discontinued, Normal    omeprazole (PRILOSEC) 40 MG capsule Take 1 capsule (40 mg total) by mouth daily. 1 capsule by mouth  BID., Starting 09/03/2011, Until Wed 09/02/12, Normal    promethazine (PHENERGAN) 25 MG tablet Take 1 tablet (25 mg total) by mouth every 6 (six) hours as needed for nausea., Starting 08/05/2011, Until Discontinued, Normal    ranitidine (ZANTAC) 150 MG capsule Take 1 capsule (150 mg total) by mouth 2 (two) times daily., Starting 08/29/2011, Until Discontinued, Normal    rosuvastatin (CRESTOR) 40 MG tablet Take 1 tablet (40 mg total) by mouth daily., Starting 08/05/2011, Until Discontinued, Normal    senna (SENOKOT) 8.6 MG tablet Take 3 tablets by mouth daily., Starting 08/05/2011, Until Discontinued, Normal    sucralfate (CARAFATE) 1 G tablet Take 1 tablet (1 g total) by mouth 3 (  three) times daily before meals., Starting 08/05/2011, Until Discontinued, Normal    torsemide (DEMADEX) 20 MG tablet Take 1 tablet (20 mg total) by mouth daily., Starting 08/05/2011, Until Discontinued, Normal     !! - Potential duplicate medications found. Please discuss with provider.    STOP taking these medications     metoprolol tartrate (LOPRESSOR) 25 MG tablet        Follow-up Information    Follow up with [facility].. Call in 1 week.   Contact information:   [address] [place] [address] [phone]          Signed: Dessa Phi 09/14/2011, 4:38 PM

## 2011-09-14 NOTE — Discharge Instructions (Signed)
Angioplasty Angioplasty is a procedure to widen a narrow blood vessel. The procedure is usually done on the blood vessels of the heart (coronary arteries) but may help vessels to other parts of the body such as the legs. When a vessel in the heart becomes partially blocked there is decreased blood flow to that area. This may lead to chest pain or a heart attack (myocardial infarction).  Angioplasty may be done after a procedure that found a problem or as an emergency to treat a heart attack by opening the blocked arteries. The arteries are usually blocked by cholesterol buildup (plaque) in the lining or walls. LET YOUR CAREGIVER KNOW ABOUT:  Allergies.   Medicines taken, including herbs, eyedrops, over-the-counter medicines, and creams.   Use of steroids (by mouth or creams).   Previous problems with anesthetics or numbing medicines.   Possibility of pregnancy, if this applies.   History of blood clots (thrombophlebitis).   History of bleeding or blood problems.   Previous surgery.   Other health problems.  RISKS AND COMPLICATIONS  Damage to the artery.   A blockage may return.   Bleeding at the insertion site.   Blood clot to another part of the body.  BEFORE THE PROCEDURE  Let your caregiver know if you have had an allergy to dyes used in X-ray, or if you have ever had kidney problems or failure.   Do not eat or drink starting from midnight up to the time of the procedure, or as directed.   You may drink enough water to take your medicines the morning of the procedure if you were instructed to do so.   You should be at the hospital or outpatient facility where the procedure is to be done 60 minutes prior to the procedure or as directed.  PROCEDURE  You may be given a medicine to help you relax before and during the procedure through an intravenous (IV) access in your hand or arm.   Medicine that numbs the area (local anesthetic) may be used before inserting the long,  thin tube (catheter).   You will be prepared for the procedure by washing and shaving the area where the catheter will be inserted. This is usually done in the groin.   A catheter will be inserted into an artery using a guide wire. This is guided under a type of X-ray (fluoroscopy) to the opening of the blocked artery.   Dye is then injected and X-rays are taken.   Once positioned at the narrowed portion of the blood vessel, the balloon is inflated to make the artery wider. Expanding the balloon crushes the plaque into the wall of the vessel and improves the blood flow.   Sometimes the artery may be made wider using a laser or other tools to remove plaque.   When the blood flow is better, the balloon is deflated and the catheter is removed.  AFTER THE PROCAcute Coronary Syndrome Acute coronary syndrome (ACS) is an urgent problem in which the blood and oxygen supply to the heart is critically deficient. ACS requires hospitalization because one or more coronary arteries may be blocked. ACS represents a range of conditions including: Previous angina that is now unstable, lasts longer, happens at rest, or is more intense.  A heart attack, with heart muscle cell injury and death.  There are three vital coronary arteries that supply the heart muscle with blood and oxygen so that it can pump blood effectively. If blockages to these arteries develop, blood flow  to the heart muscle is reduced. If the heart does not get enough blood, angina may occur as the first warning sign. SYMPTOMS  The most common signs of angina include:  Tightness or squeezing in the chest.  Feeling of heaviness on the chest.  Discomfort in the arms, neck, or jaw.  Shortness of breath and nausea.  Cold, wet skin.  Angina is usually brought on by physical effort or excitement which increase the oxygen needs of the heart. These states increase the blood flow needs of the heart beyond what can be delivered.  TREATMENT    Medicines to help discomfort may include nitroglycerin (nitro) in the form of tablets or a spray for rapid relief, or longer-acting forms such as cream, patches, or capsules. (Be aware that there are many side effects and possible interactions with other drugs).  Other medicines may be used to help the heart pump better.  Procedures to open blocked arteries including angioplasty or stent placement to keep the arteries open.  Open heart surgery may be needed when there are many blockages or they are in critical locations that are best treated with surgery.  HOME CARE INSTRUCTIONS  Avoid smoking.  Take one baby or adult aspirin daily, if your caregiver advises. This helps reduce the risk of a heart attack.  It is very important that you follow the angina treatment prescribed by your caregiver. Make arrangements for proper follow-up care.  Eat a heart healthy diet with salt and fat restrictions as advised.  Regular exercise is good for you as long as it does not cause discomfort. Do not begin any new type of exercise until you check with your caregiver.  If you are overweight, you should lose weight.  Try to maintain normal blood lipid levels.  Keep your blood pressure under control as recommended by your caregiver.  You should tell your caregiver right away about any increase in the severity or frequency of your chest discomfort or angina attacks. When you have angina, you should stop what you are doing and sit down. This may bring relief in 3 to 5 minutes. If your caregiver has prescribed nitro, take it as directed.  If your caregiver has given you a follow-up appointment, it is very important to keep that appointment. Not keeping the appointment could result in a chronic or permanent injury, pain, and disability. If there is any problem keeping the appointment, you must call back to this facility for assistance.  SEEK IMMEDIATE MEDICAL CARE IF:  You develop nausea, vomiting, or shortness of breath.   You feel faint, lightheaded, or pass out.  Your chest discomfort gets worse.  You are sweating or experience sudden profound fatigue.  You do not get relief of your chest pain after 3 doses of nitro.  Your discomfort lasts longer than 15 minutes.  MAKE SURE YOU:  Understand these instructions.  Will watch your condition.  Will get help right away if you are not doing well or get worse.  Document Released: 09/30/2005 Document Revised: 06/12/2011 Document Reviewed: 05/03/2008 [facility] Patient Information 2012 [place], [place].Blood Sugar Monitoring, Adult GLUCOSE METERS FOR SELF-MONITORING OF BLOOD GLUCOSE  It is important to be able to correctly measure your blood sugar (glucose). You can use a blood glucose monitor (a small battery-operated device) to check your glucose level at any time. This allows you and your caregiver to monitor your diabetes and to determine how well your treatment plan is working. The process of monitoring your blood glucose with a  glucose meter is called self-monitoring of blood glucose (SMBG). When people with diabetes control their blood sugar, they have better health. To test for glucose with a typical glucose meter, place the disposable strip in the meter. Then place a small sample of blood on the "test strip." The test strip is coated with chemicals that combine with glucose in blood. The meter measures how much glucose is present. The meter displays the glucose level as a number. Several new models can record and store a number of test results. Some models can connect to personal computers to store test results or print them out.  Newer meters are often easier to use than older models. Some meters allow you to get blood from places other than your fingertip. Some new models have automatic timing, error codes, signals, or barcode readers to help with proper adjustment (calibration). Some meters have a large display screen or spoken instructions for people with visual  impairments.  INSTRUCTIONS FOR USING GLUCOSE METERS  Wash your hands with soap and warm water, or clean the area with alcohol. Dry your hands completely.   Prick the side of your fingertip with a lancet (a sharp-pointed tool used by hand).   Hold the hand down and gently milk the finger until a small drop of blood appears. Catch the blood with the test strip.   Follow the instructions for inserting the test strip and using the SMBG meter. Most meters require the meter to be turned on and the test strip to be inserted before applying the blood sample.   Record the test result.   Read the instructions carefully for both the meter and the test strips that go with it. Meter instructions are found in the user manual. Keep this manual to help you solve any problems that may arise. Many meters use "error codes" when there is a problem with the meter, the test strip, or the blood sample on the strip. You will need the manual to understand these error codes and fix the problem.   New devices are available such as laser lancets and meters that can test blood taken from "alternative sites" of the body, other than fingertips. However, you should use standard fingertip testing if your glucose changes rapidly. Also, use standard testing if:   You have eaten, exercised, or taken insulin in the past 2 hours.   You think your glucose is low.   You tend to not feel symptoms of low blood glucose (hypoglycemia).   You are ill or under stress.   Clean the meter as directed by the manufacturer.   Test the meter for accuracy as directed by the manufacturer.   Take your meter with you to your caregiver's office. This way, you can test your glucose in front of your caregiver to make sure you are using the meter correctly. Your caregiver can also take a sample of blood to test using a routine lab method. If values on the glucose meter are close to the lab results, you and your caregiver will see that your meter  is working well and you are using good technique. Your caregiver will advise you about what to do if the results do not match.  FREQUENCY OF TESTING  Your caregiver will tell you how often you should check your blood glucose. This will depend on your type of diabetes, your current level of diabetes control, and your types of medicines. The following are general guidelines, but your care plan may be different. Record all  your readings and the time of day you took them for review with your caregiver.   Diabetes type 1.   When you are using insulin with good diabetic control (either multiple daily injections or via a pump), you should check your glucose 4 times a day.   If your diabetes is not well controlled, you may need to monitor more frequently, including before meals and 2 hours after meals, at bedtime, and occasionally between 2 a.m. and 3 a.m.   You should always check your glucose before a dose of insulin or before changing the rate on your insulin pump.   Diabetes type 2.   Guidelines for SMBG in diabetes type 2 are not as well defined.   If you are on insulin, follow the guidelines above.   If you are on medicines, but not insulin, and your glucose is not well controlled, you should test at least twice daily.   If you are not on insulin, and your diabetes is controlled with medicines or diet alone, you should test at least once daily, usually before breakfast.   A weekly profile will help your caregiver advise you on your care plan. The week before your visit, check your glucose before a meal and 2 hours after a meal at least daily. You may want to test before and after a different meal each day so you and your caregiver can tell how well controlled your blood sugars are throughout the course of a 24 hour period.   Gestational diabetes (diabetes during pregnancy).   Frequent testing is often necessary. Accurate timing is important.   If you are not on insulin, check your glucose 4  times a day. Check it before breakfast and 1 hour after the start of each meal.   If you are on insulin, check your glucose 6 times a day. Check it before each meal and 1 hour after the first bite of each meal.   General guidelines.   More frequent testing is required at the start of insulin treatment. Your caregiver will instruct you.   Test your glucose any time you suspect you have low blood sugar (hypoglycemia).   You should test more often when you change medicines, when you have unusual stress or illness, or in other unusual circumstances.  OTHER THINGS TO KNOW ABOUT GLUCOSE METERS  Measurement Range. Most glucose meters are able to read glucose levels over a broad range of values from as low as 0 to as high as 600 mg/dL. If you get an extremely high or low reading from your meter, you should first confirm it with another reading. Report very high or very low readings to your caregiver.   Whole Blood Glucose versus Plasma Glucose. Some older home glucose meters measure glucose in your whole blood. In a lab or when using some newer home glucose meters, the glucose is measured in your plasma (one component of blood). The difference can be important. It is important for you and your caregiver to know whether your meter gives its results as "whole blood equivalent" or "plasma equivalent."   Display of High and Low Glucose Values. Part of learning how to operate a meter is understanding what the meter results mean. Know how high and low glucose concentrations are displayed on your meter.   Factors that Affect Glucose Meter Performance. The accuracy of your test results depends on many factors and varies depending on the brand and type of meter. These factors include:   Low red blood  cell count (anemia).   Substances in your blood (such as uric acid, vitamin C, and others).   Environmental factors (temperature, humidity, altitude).   Name-brand versus generic test strips.   Calibration.  Make sure your meter is set up properly. It is a good idea to do a calibration test with a control solution recommended by the manufacturer of your meter whenever you begin using a fresh bottle of test strips. This will help verify the accuracy of your meter.   Improperly stored, expired, or defective test strips. Keep your strips in a dry place with the lid on.   Soiled meter.   Inadequate blood sample.  NEW TECHNOLOGIES FOR GLUCOSE TESTING Alternative site testing Some glucose meters allow testing blood from alternative sites. These include the:  Upper arm.   Forearm.   Base of the thumb.   Thigh.  Sampling blood from alternative sites may be desirable. However, it may have some limitations. Blood in the fingertips show changes in glucose levels more quickly than blood in other parts of the body. This means that alternative site test results may be different from fingertip test results, not because of the meter's ability to test accurately, but because the actual glucose concentration can be different.  Continuous Glucose Monitoring Devices to measure your blood glucose continuously are available, and others are in development. These methods can be more expensive than self-monitoring with a glucose meter. However, it is uncertain how effective and reliable these devices are. Your caregiver will advise you if this approach makes sense for you. IF BLOOD SUGARS ARE CONTROLLED, PEOPLE WITH DIABETES REMAIN HEALTHIER.  SMBG is an important part of the treatment plan of patients with diabetes mellitus. Below are reasons for using SMBG:   It confirms that your glucose is at a specific, healthy level.   It detects hypoglycemia and severe hyperglycemia.   It allows you and your caregiver to make adjustments in response to changes in lifestyle for individuals requiring medicine.   It determines the need for starting insulin therapy in temporary diabetes that happens during pregnancy (gestational  diabetes).  Document Released: 10/03/2003 Document Revised: 06/13/2011 Document Reviewed: 01/24/2011 [facility] Patient Information 2012 [place], [place].Chronic Obstructive Pulmonary Disease Chronic obstructive pulmonary disease (COPD) is a condition in which airflow from the lungs is restricted. The lungs can never return to normal, but there are measures you can take which will improve them and make you feel better. CAUSES   Smoking.   Exposure to secondhand smoke.   Breathing in irritants (pollution, cigarette smoke, strong smells, aerosol sprays, paint fumes).   History of lung infections.  TREATMENT  Treatment focuses on making you comfortable (supportive care). Your caregiver may prescribe medications (inhaled or pills) to help improve your breathing. HOME CARE INSTRUCTIONS   If you smoke, stop smoking.   Avoid exposure to smoke, chemicals, and fumes that aggravate your breathing.   Take antibiotic medicines as directed by your caregiver.   Avoid medicines that dry up your system and slow down the elimination of secretions (antihistamines and cough syrups). This decreases respiratory capacity and may lead to infections.   Drink enough water and fluids to keep your urine clear or pale yellow. This loosens secretions.   Use humidifiers at home and at your bedside if they do not make breathing difficult.   Receive all protective vaccines your caregiver suggests, especially pneumococcal and influenza.   Use home oxygen as suggested.   Stay active. Exercise and physical activity will help maintain  your ability to do things you want to do.   Eat a healthy diet.  SEEK MEDICAL CARE IF:   You develop pus-like mucus (sputum).   Breathing is more labored or exercise becomes difficult to do.   You are running out of the medicine you take for your breathing.  SEEK IMMEDIATE MEDICAL CARE IF:   You have a rapid heart rate.   You have agitation, confusion, tremors, or are in a  stupor (family members may need to observe this).   It becomes difficult to breathe.   You develop chest pain.   You have a fever.  MAKE SURE YOU:   Understand these instructions.   Will watch your condition.   Will get help right away if you are not doing well or get worse.  Document Released: 07/10/2005 Document Revised: 06/12/2011 Document Reviewed: 11/30/2010 [facility] Patient Information 2012 [place], [place].Chronic Obstructive Pulmonary Disease Chronic obstructive pulmonary disease (COPD) is a lung disease. The lungs become damaged, making it hard to get air in and out of your lungs. The damage to your lungs cannot be changed.  HOME CARE  Stop smoking if you smoke. Avoid secondhand smoke.   Only take medicine as told by your doctor.   Talk to your doctor about using cough syrup or over-the-counter medicines.   Drink enough fluids to keep your pee (urine) clear or pale yellow.   Use a humidifier or vaporizer. This may help loosen the thick spit (mucus).   Talk to your doctor about vaccines that help prevent other lung problems (pneumonia and flu vaccines).   Use home oxygen as told by your doctor.   Stay active and exercise.   Eat healthy foods.  GET HELP RIGHT AWAY IF:   Your heart is beating fast.   You become disturbed, confused, shake, or are dazed.   You have trouble breathing.   You have chest pain.   You have a fever.   You cough up thick spit that is yellowish-white or green.   Your breathing becomes worse when you exercise.   You are running out of the medicine you take for your breathing.  MAKE SURE YOU:   Understand these instructions.   Will watch your condition.   Will get help right away if you are not doing well or get worse.  Document Released: 03/18/2008 Document Revised: 06/12/2011 Document Reviewed: 11/30/2010 [facility] Patient Information 2012 [place], [place].Diabetes and Exercise Regular exercise is important and can help:    Control blood glucose (sugar).   Decrease blood pressure.    Control blood lipids (cholesterol, triglycerides).   Improve overall health.  BENEFITS FROM EXERCISE  Improved fitness.   Improved flexibility.   Improved endurance.   Increased bone density.   Weight control.   Increased muscle strength.   Decreased body fat.   Improvement of the body's use of insulin, a hormone.   Increased insulin sensitivity.   Reduction of insulin needs.   Reduced stress and tension.   Helps you feel better.  People with diabetes who add exercise to their lifestyle gain additional benefits, including:  Weight loss.   Reduced appetite.   Improvement of the body's use of blood glucose.   Decreased risk factors for heart disease:   Lowering of cholesterol and triglycerides.   Raising the level of good cholesterol (high-density lipoproteins, HDL).   Lowering blood sugar.   Decreased blood pressure.  TYPE 1 DIABETES AND EXERCISE  Exercise will usually lower your blood glucose.  If blood glucose is greater than 240 mg/dl, check urine ketones. If ketones are present, do not exercise.   Location of the insulin injection sites may need to be adjusted with exercise. Avoid injecting insulin into areas of the body that will be exercised. For example, avoid injecting insulin into:   The arms when playing tennis.   The legs when jogging. For more information, discuss this with your caregiver.   Keep a record of:   Food intake.   Type and amount of exercise.   Expected peak times of insulin action.   Blood glucose levels.  Do this before, during, and after exercise. Review your records with your caregiver. This will help you to develop guidelines for adjusting food intake and insulin amounts.  TYPE 2 DIABETES AND EXERCISE  Regular physical activity can help control blood glucose.   Exercise is important because it may:   Increase the body's sensitivity to insulin.    Improve blood glucose control.   Exercise reduces the risk of heart disease. It decreases serum cholesterol and triglycerides. It also lowers blood pressure.   Those who take insulin or oral hypoglycemic agents should watch for signs of hypoglycemia. These signs include dizziness, shaking, sweating, chills, and confusion.   Body water is lost during exercise. It must be replaced. This will help to avoid loss of body fluids (dehydration) or heat stroke.  Be sure to talk to your caregiver before starting an exercise program to make sure it is safe for you. Remember, any activity is better than none.  Document Released: 12/21/2003 Document Revised: 06/12/2011 Document Reviewed: 04/06/2009 [facility] Patient Information 2012 [place], [place].Diabetes and Foot Care Diabetes may cause you to have a poor blood supply (circulation) to your legs and feet. Because of this, the skin may be thinner, break easier, and heal more slowly. You also may have nerve damage in your legs and feet causing decreased feeling. You may not notice minor injuries to your feet that could lead to serious problems or infections. Taking care of your feet is one of the most important things you can do for yourself.  HOME CARE INSTRUCTIONS  Do not go barefoot. Bare feet are easily injured.   Check your feet daily for blisters, cuts, and redness.   Wash your feet with warm water (not hot) and mild soap. Pat your feet and between your toes until completely dry.   Apply a moisturizing lotion that does not contain alcohol or petroleum jelly to the dry skin on your feet and to dry brittle toenails. Do not put it between your toes.   Trim your toenails straight across. Do not dig under them or around the cuticle.   Do not cut corns or calluses, or try to remove them with medicine.   Wear clean cotton socks or stockings every day. Make sure they are not too tight. Do not wear knee high stockings since they may decrease blood flow  to your legs.   Wear leather shoes that fit properly and have enough cushioning. To break in new shoes, wear them just a few hours a day to avoid injuring your feet.   Wear shoes at all times, even in the house.   Do not cross your legs. This may decrease the blood flow to your feet.   If you find a minor scrape, cut, or break in the skin on your feet, keep it and the skin around it clean and dry. These areas may be cleansed with mild soap  and water. Do not use peroxide, alcohol, iodine or Merthiolate.   When you remove an adhesive bandage, be sure not to harm the skin around it.   If you have a wound, look at it several times a day to make sure it is healing.   Do not use heating pads or hot water bottles. Burns can occur. If you have lost feeling in your feet or legs, you may not know it is happening until it is too late.   Report any cuts, sores or bruises to your caregiver. Do not wait!  SEEK MEDICAL CARE IF:   You have an injury that is not healing or you notice redness, numbness, burning, or tingling.   Your feet always feel cold.   You have pain or cramps in your legs and feet.  SEEK IMMEDIATE MEDICAL CARE IF:   There is increasing redness, swelling, or increasing pain in the wound.   There is a red line that goes up your leg.   Pus is coming from a wound.   You develop an unexplained oral temperature above 102 F (38.9 C), or as your caregiver suggests.   You notice a bad smell coming from an ulcer or wound.  MAKE SURE YOU:   Understand these instructions.   Will watch your condition.   Will get help right away if you are not doing well or get worse.  Document Released: 09/27/2000 Document Revised: 06/12/2011 Document Reviewed: 04/05/2009 [facility] Patient Information 2012 [place], [place].Diabetes and Sick Day Management Blood sugar (glucose) can be more difficult to control when you are sick. Colds, fever, flu, nausea, vomiting, and diarrhea are all examples of  common illnesses that can cause problems for people with diabetes. Loss of body fluids (dehydration) from fever, vomiting, diarrhea, infection, and the stress of a sickness can all cause blood glucose levels to rise. Because of this, it is very important to take your diabetes medicines and to eat some form of carbohydrate food when you are sick. Liquid or soft foods are often tolerated, and they help to replace fluids. HOME CARE INSTRUCTIONS These main guidelines are intended for managing a short-term (24 hours or less) sickness:  Take your usual dose of insulin or oral diabetes medicine. An exception would be if you take any form of metformin. If you cannot eat or drink, you can become dehydrated and should not take this medicine.   Continue to take your insulin even if you are unable to eat solid foods or are vomiting. Your insulin dose may stay the same, or it may need to be increased when you are sick.   You will need to test your blood glucose more often, generally every 2 to 4 hours. If you have type 1 diabetes, test your urine for ketones every 4 hours. If you have type 2 diabetes, test your ketones as directed by your caregiver.   Eat some form of food that contains carbohydrates. The carbohydrates can be in solid or liquid form. You should eat 45 to 50 grams of carbohydrates every 3 to 4 hours.   Replace fluids if fever, vomiting, or diarrhea is present. Ask your caregiver for specific rehydration instructions.   Watch carefully for the signs of ketoacidosis if you have type 1 diabetes. Call your caregiver if any of the following symptoms are present, especially in children:   Moderate to large ketones in the urine along with a high blood glucose level.   Severe nausea.   Vomiting.   Diarrhea.  Abdominal pain.   Rapid breathing.   Drink extra liquids that do not contain sugar, such as water or sugar-free liquids, if your blood glucose is higher than 240 mg/dl.   Be careful  with over-the-counter medicines. Read the labels. They may contain sugar or types of sugars that can raise your blood glucose.  Food Choices for Illness All of the food choices below contain around 15 grams of carbohydrates. Plan ahead and keep some of these foods around.    to  cup carbonated beverage containing sugar. Carbonated beverages will usually be better tolerated if they are opened and left at room temperature for a few minutes.    of a twin frozen ice pop.    cup sweetened gelatin dessert.    cup juice.    cup ice cream or frozen yogurt.    cup cooked cereal.    cup sherbet.   1 cup broth-based soup with noodles or rice, reconstituted with water.   1 cup cream soup.    cup regular custard.    cup regular pudding.   1 cup sports drink.   1 cup plain yogurt.   1 slice toast.   6 squares saltine crackers.   5 vanilla wafers.  SEEK MEDICAL CARE IF:   You are sick and see no improvement in 6 to 8 hours.   You are unable to eat regular foods for more than 24 hours.   You have blood glucose readings over 240 mg/dl, 2 times in a row.   Your blood glucose is less than 70 mg/dl, 2 times in a row.   You are not sure what to do to take care of yourself.   You vomit more than once in 4 to 6 hours.   You have moderate or large ketones in your urine.   You have a fever.   Your symptoms are getting worse even though you are following your caregiver's instructions.  Document Released: 10/03/2003 Document Revised: 06/12/2011 Document Reviewed: 06/28/2009 [facility] Patient Information 2012 [place], [place].Diabetes and Standards of Medical Care  Diabetes is complicated. You may find that your diabetes team includes a dietitian, nurse, diabetes educator, eye doctor, and more. To help everyone know what is going on and to help you get the care you deserve, the following schedule of care was developed to help keep you on track. Below are the tests, exams,  vaccines, medicines, education, and plans you will need. A1c test  Performed at least 2 times a year if you are meeting treatment goals.   Performed 4 times a year if therapy has changed or if you are not meeting therapy/glycemic goals.  Aspirin medicine  Take daily as directed by your caregiver.  Blood pressure test  Performed at every routine medical visit. The goal is less than 130/80 mm/Hg.  Dental exam  Get a dental exam at least 2 times a year.  Dilated eye exam (retinal exam)  Type 1 diabetes: Get an exam within 5 years of diagnosis and then yearly.   Type 2 diabetes: Get an exam at diagnosis and then yearly.  All exams thereafter can be extended to every 2 to 3 years if one or more exams have been normal. Foot care exam  Visual foot exams are performed at every routine medical visit. The exams check for cuts, injuries, or other problems with the feet.   A comprehensive foot exam should be done yearly. This includes visual inspection as well as assessing foot pulses and testing  for loss of sensation.  Kidney function test (urine microalbumin)  Performed once a year.   Type 1 diabetes: The first test is performed 5 years after diagnosis.   Type 2 diabetes: The first test is performed at the time of diagnosis.   A serum creatinine and estimated glomerular filtration rate (eGFR) test is done once a year to tell the level of chronic kidney disease (CKD), if present.  Lipid profile (Cholesterol, HDL, LDL, Triglycerides)  Performed once a year for most people. If at low risk, may be assessed every 2 years.   The goal for LDL is less than 100 mg/dl. If at high risk, the goal is less than 70 mg/dl.   The goal for HDL is higher than 40 mg/dl for men and higher than 50 mg/dl for women.   The goal for triglycerides is less than 150 mg/dl.  Flu vaccine, pneumonia vaccine, and hepatitis B vaccine  The flu vaccine is recommended yearly.   The pneumonia vaccine is generally  given once in a lifetime. However, there are some instances where another vaccine is recommended. Check with your caregiver.   The hepatitis B vaccine is also recommended for adults with diabetes.  Diabetes self-management education  Recommended at diagnosis and ongoing as needed.  Treatment plan  Reviewed at every medical visit.  Document Released: 07/28/2009 Document Revised: 06/12/2011 Document Reviewed: 04/02/2011 [facility] Patient Information 2012 [place], [place].Diabetes Meal Planning Guide The diabetes meal planning guide is a tool to help you plan your meals and snacks. It is important for people with diabetes to manage their blood glucose (sugar) levels. Choosing the right foods and the right amounts throughout your day will help control your blood glucose. Eating right can even help you improve your blood pressure and reach or maintain a healthy weight. CARBOHYDRATE COUNTING MADE EASY When you eat carbohydrates, they turn to sugar. This raises your blood glucose level. Counting carbohydrates can help you control this level so you feel better. When you plan your meals by counting carbohydrates, you can have more flexibility in what you eat and balance your medicine with your food intake. Carbohydrate counting simply means adding up the total amount of carbohydrate grams in your meals and snacks. Try to eat about the same amount at each meal. Foods with carbohydrates are listed below. Each portion below is 1 carbohydrate serving or 15 grams of carbohydrates. Ask your dietician how many grams of carbohydrates you should eat at each meal or snack. Grains and Starches  1 slice bread.    English muffin or hotdog/hamburger bun.    cup cold cereal (unsweetened).   ? cup cooked pasta or rice.    cup starchy vegetables (corn, potatoes, peas, beans, winter squash).   1 tortilla (6 inches).    bagel.   1 waffle or pancake (size of a CD).    cup cooked cereal.   4 to 6 small  crackers.  *Whole grain is recommended. Fruit  1 cup fresh unsweetened berries, melon, papaya, pineapple.   1 small fresh fruit.    banana or mango.    cup fruit juice (4 oz unsweetened).    cup canned fruit in natural juice or water.   2 tbs dried fruit.   12 to 15 grapes or cherries.  Milk and Yogurt  1 cup fat-free or 1% milk.   1 cup soy milk.   6 oz light yogurt with sugar-free sweetener.   6 oz low-fat soy yogurt.   6 oz  plain yogurt.  Vegetables  1 cup raw or  cup cooked is counted as 0 carbohydrates or a "free" food.   If you eat 3 or more servings at 1 meal, count them as 1 carbohydrate serving.  Other Carbohydrates   oz chips or pretzels.    cup ice cream or frozen yogurt.    cup sherbet or sorbet.   2 inch square cake, no frosting.   1 tbs honey, sugar, jam, jelly, or syrup.   2 small cookies.   3 squares of graham crackers.   3 cups popcorn.   6 crackers.   1 cup broth-based soup.   Count 1 cup casserole or other mixed foods as 2 carbohydrate servings.   Foods with less than 20 calories in a serving may be counted as 0 carbohydrates or a "free" food.  You may want to purchase a book or computer software that lists the carbohydrate gram counts of different foods. In addition, the nutrition facts panel on the labels of the foods you eat are a good source of this information. The label will tell you how big the serving size is and the total number of carbohydrate grams you will be eating per serving. Divide this number by 15 to obtain the number of carbohydrate servings in a portion. Remember, 1 carbohydrate serving equals 15 grams of carbohydrate. SERVING SIZES Measuring foods and serving sizes helps you make sure you are getting the right amount of food. The list below tells how big or small some common serving sizes are.  1 oz.........4 stacked dice.   3 oz........[place] KitchenDeck of cards.   1 tsp.......[place] KitchenTip of little finger.   1  tbs......Marland KitchenMarland KitchenThumb.   2 tbs.......Marland KitchenGolf ball.    cup......Marland KitchenHalf of a fist.   1 cup.......Marland KitchenA fist.  SAMPLE DIABETES MEAL PLAN Below is a sample meal plan that includes foods from the grain and starches, dairy, vegetable, fruit, and meat groups. A dietician can individualize a meal plan to fit your calorie needs and tell you the number of servings needed from each food group. However, controlling the total amount of carbohydrates in your meal or snack is more important than making sure you include all of the food groups at every meal. You may interchange carbohydrate containing foods (dairy, starches, and fruits). The meal plan below is an example of a 2000 calorie diet using carbohydrate counting. This meal plan has 17 carbohydrate servings. Breakfast  1 cup oatmeal (2 carb servings).    cup light yogurt (1 carb serving).   1 cup blueberries (1 carb serving).    cup almonds.  Snack  1 large apple (2 carb servings).   1 low-fat string cheese stick.  Lunch  Chicken breast salad.   1 cup spinach.    cup chopped tomatoes.   2 oz chicken breast, sliced.   2 tbs low-fat [place] dressing.   12 whole-wheat crackers (2 carb servings).   12 to 15 grapes (1 carb serving).   1 cup low-fat milk (1 carb serving).  Snack  1 cup carrots.    cup hummus (1 carb serving).  Dinner  3 oz broiled salmon.   1 cup brown rice (3 carb servings).  Snack  1  cups steamed broccoli (1 carb serving) drizzled with 1 tsp olive oil and lemon juice.   1 cup light pudding (2 carb servings).  DIABETES MEAL PLANNING WORKSHEET Your dietician can use this worksheet to help you decide how many servings of foods and what types of  foods are right for you.  BREAKFAST Food Group and Servings / Carb Servings Grain/Starches __________________________________ Dairy __________________________________________ Vegetable ______________________________________ Fruit  ___________________________________________ Meat __________________________________________ Fat ____________________________________________ LUNCH Food Group and Servings / Carb Servings Grain/Starches ___________________________________ Dairy ___________________________________________ Fruit ____________________________________________ Meat ___________________________________________ Fat _____________________________________________ [place] Food Group and Servings / Carb Servings Grain/Starches ___________________________________ Dairy ___________________________________________ Fruit ____________________________________________ Meat ___________________________________________ Fat _____________________________________________ SNACKS Food Group and Servings / Carb Servings Grain/Starches ___________________________________ Dairy ___________________________________________ Vegetable _______________________________________ Fruit ____________________________________________ Meat ___________________________________________ Fat _____________________________________________ DAILY TOTALS Starches _________________________ Vegetable ________________________ Fruit ____________________________ Dairy ____________________________ Meat ____________________________ Fat ______________________________ Document Released: 06/27/2005 Document Revised: 06/12/2011 Document Reviewed: 05/08/2009 [employer] Patient Information 2012 [place], [place].Heart Failure Heart failure (HF) is a condition in which the heart has trouble pumping blood. This means your heart does not pump blood efficiently for your body to work well. In some cases of HF, fluid may back up into your lungs or you may have swelling (edema) in your lower legs. HF is a long-term (chronic) condition. It is important for you to take good care of yourself and follow your caregiver's treatment plan. CAUSES   Health conditions:   High blood  pressure (hypertension) causes the heart muscle to work harder than normal. When pressure in the blood vessels is high, the heart needs to pump (contract) with more force in order to circulate blood throughout the body. High blood pressure eventually causes the heart to become stiff and weak.   Coronary artery disease (CAD) is the buildup of cholesterol and fat (plaques) in the arteries of the heart. The blockage in the arteries deprives the heart muscle of oxygen and blood. This can cause chest pain and may lead to a heart attack. High blood pressure can also contribute to CAD.   Heart attack (myocardial infarction) occurs when 1 or more arteries in the heart become blocked. The loss of oxygen damages the muscle tissue of the heart. When this happens, part of the heart muscle dies. The injured tissue does not contract as well and weakens the heart's ability to pump blood.   Abnormal heart valves can cause HF when the heart valves do not open and close properly. This makes the heart muscle pump harder to keep the blood flowing.   Heart muscle disease (cardiomyopathy or myocarditis) is damage to the heart muscle from a variety of causes. These can include drug or alcohol abuse, infections, or unknown reasons. These can increase the risk of HF.   Lung disease makes the heart work harder because the lungs do not work properly. This can cause a strain on the heart leading it to fail.   Diabetes increases the risk of HF. High blood sugar contributes to high fat (lipid) levels in the blood. Diabetes can also cause slow damage to tiny blood vessels that carry important nutrients to the heart muscle. When the heart does not get enough oxygen and food, it can cause the heart to become weak and stiff. This leads to a heart that does not contract efficiently.   Other diseases can contribute to HF. These include abnormal heart rhythms, thyroid problems, and low blood counts (anemia).   Unhealthy lifestyle  habits:   Obesity.   Smoking.   Eating foods high in fat and cholesterol.   Eating or drinking beverages high in salt.   Drug or alcohol abuse.   Lack of exercise.  SYMPTOMS  HF symptoms may vary and can be hard to detect. Symptoms may include:  Shortness of breath with activity, such as climbing  stairs.   Persistent cough.   Swelling of the feet, ankles, legs, or abdomen.   Unexplained weight gain.   Difficulty breathing when lying flat.   Waking from sleep because of the need to sit up and get more air.   Rapid heartbeat.   Fatigue and loss of energy.   Feeling lightheaded or close to fainting.  DIAGNOSIS  A diagnosis of HF is based on your history, symptoms, physical examination, and diagnostic tests. Diagnostic tests for HF may include:  EKG.   Chest X-ray.   Blood tests.   Exercise stress test.   Blood oxygen test (arterial blood gas).   Evaluation by a heart doctor (cardiologist).   Ultrasound evaluation of the heart (echocardiogram).   Heart artery test to look for blockages (angiogram).   Radioactive imaging to look at the heart (radionuclide test).  TREATMENT  Treatment is aimed at managing the symptoms of HF. Medicines, lifestyle changes, or surgical intervention may be necessary to treat HF.  Medicines to help treat HF may include:   Angiotensin-converting enzyme (ACE) inhibitors. These block the effects of a blood protein called angiotensin-converting enzyme. ACE inhibitors relax (dilate) the blood vessels and help lower blood pressure. This decreases the workload of the heart, slows the progression of HF, and improves symptoms.   Angiotensin receptor blockers (ARBs). These medications work similar to ACE inhibitors. ARBs may be an alternative for people who cannot tolerate an ACE inhibitor.   Aldosterone antagonists. This medication helps get rid of extra fluid from your body. This lowers the volume of blood the heart has to pump.   Water  pills (diuretics). Diuretics cause the kidneys to remove salt and water from the blood. The extra fluid is removed by urination. By removing extra fluid from the body, diuretics help lower the workload of the heart and help prevent fluid buildup in the lungs so breathing is easier.   Beta blockers. These prevent the heart from beating too fast and improve heart muscle strength. Beta blockers help maintain a normal heart rate, control blood pressure, and improve HF symptoms.   Digitalis. This increases the force of the heartbeat and may be helpful to people with HF or heart rhythm problems.   Healthy lifestyle changes include:   Stopping smoking.   Eating a healthy diet. Avoid foods high in fat. Avoid foods fried in oil or made with fat. A dietician can help with healthy food choices.   Limiting how much salt you eat.   Limiting alcohol intake to no more than 1 drink per day for women and 2 drinks per day for men. Drinking more than that is harmful to your heart. If your heart has already been damaged by alcohol or you have severe HF, drinking alcohol should be stopped completely.   Exercising as directed by your caregiver.   Surgical treatment for HF may include:   Procedures to open blocked arteries, repair damaged heart valves, or remove damaged heart muscle tissue.   A pacemaker to help heart muscle function and to control certain abnormal heart rhythms.   A defibrillator to possibly prevent sudden cardiac death.  HOME CARE INSTRUCTIONS   Activity level. Your caregiver can help you determine what type of exercise program may be helpful. It is important to maintain your strength. Pace your physical activity to avoid shortness of breath or chest pain. Rest for 1 hour before and after meals. A cardiac rehabilitation program may be helpful to some people with HF.   Diet.  Eat a heart healthy diet. Food choices should be low in saturated fat and cholesterol. Talk to a dietician to learn  about heart healthy foods.   Salt intake. When you have HF, you need to limit the amount of salt you eat. Eat less than 1500 milligrams (mg) of salt per day or as recommended by your caregiver.   Weight monitoring. Weigh yourself every day. You should weigh yourself in the morning after you urinate and before you eat breakfast. Wear the same amount of clothing each time you weigh yourself. Record your weight daily. Bring your recorded weights to your clinic visits. Tell your caregiver right away if you have gained 3 lb/1.4 kg in 1 day, or 5 lb/2.3 kg in a week or whatever amount you were told to report.   Blood pressure monitoring. This should be done as directed by your caregiver. A home blood pressure cuff can be purchased at a drugstore. Record your blood pressure numbers and bring them to your clinic visits. Tell your caregiver if you become dizzy or lightheaded upon standing up.   Smoking. If you are currently a smoker, it is time to quit. Nicotine makes your heart work harder by causing your blood vessels to constrict. Do not use nicotine gum or patches before talking to your caregiver.   Follow up. Be sure to schedule a follow-up visit with your caregiver. Keep all your appointments.  SEEK MEDICAL CARE IF:   Your weight increases by 3 lb/1.4 kg in 1 day or 5 lb/2.3 kg in a week.   You notice increasing shortness of breath that is unusual for you. This may happen during rest, sleep, or with activity.   You cough more than normal, especially with physical activity.   You notice more swelling in your hands, feet, ankles, or belly (abdomen).   You are unable to sleep because it is hard to breathe.   You cough up bloody mucus (sputum).   You begin to feel "jumping" or "fluttering" sensations (palpitations) in your chest.  SEEK IMMEDIATE MEDICAL CARE IF:   You have severe chest pain or pressure which may include symptoms such as:   Pain or pressure in the arms, neck, jaw, or back.    Feeling sweaty.   Feeling sick to your stomach (nauseous).   Feeling short of breath while at rest.   Having a fast or irregular heartbeat.   You experience stroke symptoms. These symptoms include:   Facial weakness or numbness.   Weakness or numbness in an arm, leg, or on one side of your body.   Blurred vision.   Difficulty talking or thinking.   Dizziness or fainting.   Severe headache.  THESE ARE MEDICAL EMERGENCIES. Do not wait to see if the symptoms go away. Call your local emergency services (911 in [place] DO NOT drive yourself to the hospital. IMPORTANT  Make a list of every medicine, vitamin, or herbal supplement you are taking. Keep the list with you at all times. Show it to your caregiver at every visit. Keep the list up-to-date.   Ask your caregiver or pharmacist to write an explanation of each medicine you are taking. This should include:   Why you are taking it.   The possible side effects.   The best time of day to take it.   Foods to take with it or what foods to avoid.   When to stop taking it.  MAKE SURE YOU:   Understand these instructions.   Will  watch your condition.   Will get help right away if you are not doing well or get worse.  Document Released: 09/30/2005 Document Revised: 06/12/2011 Document Reviewed: 01/12/2010 [facility] Patient Information 2012 [place], [place].Heart Failure Heart failure (HF) means your heart has trouble pumping blood. The blood is not circulated very well in your body because your heart is weak. HF may cause blood to back up into your lungs. This is commonly called "fluid in the lungs." HF may also cause your ankles and legs to puff up (swell). It is important to take good care of yourself when you have HF. HOME CARE Medicine  Take your medicine as told by your doctor.   Do not stop taking your medicine unless told to by your doctor.   Be sure to get your medicine refilled before it runs out.   Do not skip  any doses of medicine.   Tell your doctor if you cannot afford your medicine.   Keep a list of all the medicine you take. This should include the name, how much you take, and when you take it.   Ask your doctor if you have any questions about your medicine. Do not take over-the-counter medicine unless your doctor says it is okay.  What you eat  Do not drink alcohol unless your doctor says it is okay.   Avoid food that is high in fat. Avoid foods fried in oil or made with fat.   Eat a healthy diet. A dietitian can help you with healthy food choices.   Limit how much salt you eat. Do not eat more than 1500 milligrams (mg) of salt (sodium) a day.   Do not add salt to your food.   Do not eat food made with a lot of salt. Here are some examples:   Canned vegetables.   Canned soups.   Canned drinks.   Hot dogs.   Fast food.   Pizza.   Chips.  Check your weight  Weigh yourself every morning. You should do this after you pee (urinate) and before you eat breakfast.   Wear the same amount of clothes each time you weigh yourself.   Write down your weight every day. Tell your doctor if you gain 3 lb/1.4 kg or more in 1 day or 5 lb/2.3 kg in a week.  Blood pressure monitoring  Buy a home blood pressure cuff.   Check your blood pressure as told by your doctor. Write down your blood pressure numbers on a sheet of paper.   Bring your blood pressure numbers to your doctor visits.  Smoking  Smoking is bad for your heart.   Ask your doctor how to stop smoking.  Exercise  Talk to your doctor about exercise.   Ask how much exercise is right for you.   Exercise as much as you can. Stop if you feel tired, have problems breathing, or have chest pain.  Keep all your doctor appointments. GET HELP RIGHT AWAY IF:   You have trouble breathing.   You have a cough that does not go away.   You cannot sleep because you have trouble breathing.   You gain 3 lb/1.4 kg or more in 1  day or 5 lb/2.3 kg in a week.   You have puffy ankles or legs.   You have an enlarged (bloated) belly (abdomen).   You pass out (faint).   You have really bad chest pain or pressure. This includes pain or pressure in your:  Arms.   Jaw.   Neck.   Back.  If you have any of the above problems, call your local emergency services (911 in [place] Do not drive yourself to the hospital. MAKE SURE YOU:   Understand these instructions.   Will watch your condition.   Will get help right away if you are not doing well or get worse.  Document Released: 07/09/2008 Document Revised: 06/12/2011 Document Reviewed: 01/31/2009 [facility] Patient Information 2012 [place], [place].High Blood Sugar High blood sugar (hyperglycemia) means that the level of sugar in your blood is higher than it should be. Signs of high blood sugar include:  Feeling thirsty.   Frequent peeing (urinating).   Feeling tired or sleepy.   Dry mouth.   Vision changes.   Feeling weak.   Feeling hungry but losing weight.   Numbness and tingling in your hands or feet.   Headache.  When you ignore these signs, your blood sugar may keep going up. These problems may get worse, and other problems may begin. HOME CARE  Check your blood sugars as told by your doctor. Write down the numbers with the date and time.   Take the right amount of insulin or diabetes pills at the right time. Write down the dose with date and time.   Refill your insulin or diabetes pills before running out.   Watch what you eat. Follow your meal plan.   Drink liquids without sugar, such as water. Check with your doctor if you have kidney or heart disease.   Follow your doctor's orders for exercise. Exercise at the same time of day.   Keep your doctor's appointments.  GET HELP RIGHT AWAY IF:   You have trouble thinking or are confused.   You have fast breathing with fruity smelling breath.   You pass out (faint).   You have 2 to 3  days of high blood sugars and you do not know why.   You have chest pain.   You are feeling sick to your stomach (nauseous) or throwing up (vomiting).   You have sudden vision changes.  MAKE SURE YOU:   Understand these instructions.   Will watch your condition.   Will get help right away if you are not doing well or get worse.  Document Released: 07/28/2009 Document Revised: 06/12/2011 Document Reviewed: 07/28/2009 [facility] Patient Information 2012 [place], [place].Myocardial Infarction A myocardial infarction (MI) is damage to the heart that is not reversible. It is also called a heart attack. An MI usually occurs when a heart (coronary) artery becomes blocked or narrowed. This cuts off the blood supply to the heart. When one or more of the heart (coronary) arteries becomes blocked, that area of the heart begins to die. This causes pain felt during an MI.  If you think you might be having an MI, call your local emergency services immediately (911 in [place] It is recommended that you take a 162 mg non-enteric coated aspirin if you do not have an aspirin allergy. Do not drive yourself to the hospital or wait to see if your symptoms go away. The sooner MI is treated, the greater the amount of heart muscle saved. Time is muscle. It can save your life. CAUSES  An MI can occur from:  A gradual buildup of a fatty substance called plaque. When plaque builds up in the arteries, this condition is called atherosclerosis. This buildup can block or reduce the blood supply to the heart artery(s).   A sudden plaque rupture within  a heart artery that causes a blood clot (thrombus). A blood clot can block the heart artery which does not allow blood flow to the heart.   A severe tightening (spasm) of the heart artery. This is a less common cause of a heart attack. When a heart artery spasms, it cuts off blood flow through the artery. Spasms can occur in heart arteries that do not have atherosclerosis.  RISK  FACTORS People at risk for an MI usually have one or more risk factors, such as:  High blood pressure.   High cholesterol.   Smoking.   Gender. Men have a higher heart attack risk.   Overweight/obesity.   Age.   Family history.   Lack of exercise.   Diabetes.   Stress.   Excessive alcohol use.   Street drug use (cocaine and methamphetamines).  SYMPTOMS  MI symptoms can vary, such as:  In both men and women, MI symptoms can include the following:   Chest pain. The chest pain may feel like a crushing, squeezing, or "pressure" type feeling. MI pain can be "referred," meaning pain can be caused in one part of the body but felt in another part of the body. Referred MI pain may occur in the left arm, neck, or jaw. Pain may even be felt in the right arm.   Shortness of breath (dyspnea).   Heartburn or indigestion with or without vomiting, shortness of breath, or sweating (diaphoresis).   Sudden, cold sweats.   Sudden lightheadedness.   Upper back pain.   Women can have unique MI symptoms, such as:   Unexplained feelings of nervousness or anxiety.   Discomfort between the shoulder blades (scapula) or upper back.   Tingling in the hands and arms.   In elderly people (regardless of gender), MI symptoms can be subtle, such as:   Sweating (diaphoresis).   Shortness of breath (dyspnea).   General tiredness (fatigue) or not feeling well (malaise).  DIAGNOSIS  Diagnosis of an MI involves several tests such as:  An assessment of your vital signs such as heart rhythm, blood pressure, respiratory rate, and oxygen level.   An EKG (ECG) to look at the electrical activity of your heart.   Blood tests called cardiac markers are drawn at scheduled times to measure proteins or enzymes released by the damaged heart muscle.   A chest X-ray.   An echocardiogram to evaluate heart motion and blood flow.   Coronary angiography (cardiac catheterization). This is a diagnostic  procedure to look at the heart arteries.  TREATMENT  Acute Intervention. For an MI, the national standard in the [place] is to have an acute intervention in under 90 minutes from the time you get to the hospital. An acute intervention is a special procedure to open up the heart arteries. It is done in a treatment room called a "catheterization lab" (cath lab). Some hospitals do no have a cath lab. If you are having an MI and the hospital does not have a cath lab, the standard is to transport you to a hospital that has one. In the cath lab, acute intervention includes:  Angioplasty. An angioplasty involves inserting a thin, flexible tube (catheter) into an artery in either your groin or wrist. The catheter is threaded to the heart arteries. A balloon at the end of the catheter is inflated to open a narrowed or blocked heart artery. During an angioplasty procedure, a small mesh tube (stent) may be used to keep the heart artery open.  Depending on your condition and health history, one of two types of stents may be placed:   Drug-eluting stent (DES). A DES is coated with a medicine to prevent scar tissue from growing over the stent. With drug-eluting stents, blood thinning medicine will need to be taken for up to a year.   Bare metal stent. This type of stent has no special coating to keep tissue from growing over it. This type of stent is used if you cannot take blood thinning medicine for a prolonged time or you need surgery in the near future. After a bare metal stent is placed, blood thinning medicine will need to be taken for about a month.   If you are taking blood thinning medicine (anti-platelet therapy) after stent placement, do not stop taking it unless your caregiver says it is okay to do so. Make sure you understand how long you need to take the medicine.  Surgical Intervention  If an acute intervention is not successful, surgery may be needed:   Open heart surgery (coronary artery  bypass graft, CABG). CABG takes a vein (saphenous vein) from your leg. The vein is then attached to the blocked heart artery which bypasses the blockage. This then allows blood flow to the heart muscle.  Additional Interventions  A "clot buster" medicine (thrombolytic) may be given. This medicine can help break up a clot in the heart artery. This medicine may be given if a person cannot get to a cath lab right away.   Intra-aortic balloon pump (IABP). If you have suffered a very severe MI and are too unstable to go to the cath lab or to surgery, an IABP may be used. This is a temporary mechanical device used to increase blood flow to the heart and reduce the workload of the heart until you are stable enough to go to the cath lab or surgery.  HOME CARE INSTRUCTIONS After an MI, you may need the following:  Medication. Take medication as directed by your caregiver. Medications after an MI may:   Keep your blood from clotting easily (blood thinners).   Control your blood pressure.   Help lower your cholesterol.   Control abnormal heart rhythms.   Lifestyle changes. Under the guidance of your caregiver, lifestyle changes include:   Quitting smoking, if you smoke. Your caregiver can help you quit.   Being physically active.   Maintaining a healthy weight.   Eating a heart healthy diet. A dietician can help you learn healthy eating options.   Managing diabetes.   Reducing stress.   Limiting alcohol intake.  SEEK IMMEDIATE MEDICAL CARE IF:   You have severe chest pain, especially if the pain is crushing or pressure-like and spreads to the arms, back, neck, or jaw. This is an emergency. Do not wait to see if the pain will go away. Get medical help at once. Call your local emergency services (911 in the [place] Do not drive yourself to the hospital.   You have shortness of breath during rest, sleep, or with activity.   You have sudden sweating or clammy skin.   You feel sick to your  stomach (nauseous) and throw up (vomit).   You suddenly become lightheaded or dizzy.   You feel your heart beating rapidly or you notice "skipped" beats.  MAKE SURE YOU:   Understand these instructions.   Will watch your condition.   Will get help right away if you are not doing well or get worse.  Document Released: 09/30/2005  Document Revised: 06/12/2011 Document Reviewed: 02/27/2011 [facility] Patient Information 2012 [place], [place].EDURE  You will stay in bed for several hours.   The access site will be watched and you will be checked frequently.   Blood tests, X-rays, and an electrocardiogram (EKG) may be done.   You may stay in the hospital overnight for observation.  Document Released: 09/27/2000 Document Revised: 06/12/2011 Document Reviewed: 01/21/2008 [facility] Patient Information 2012 [place], [place].

## 2011-09-14 NOTE — Progress Notes (Signed)
Subjective: Pt states that CP is improved. Pt states that she has still had some pleuritic CP associated with deep breathing. Pt is also complaining of RUQ pain, which has been a chronic issue. Pt states that she has baseline GB disease. Pt states that IV morphine has been helping with this.   Objective: Vital signs in last 24 hours: Temp:  [98.3 F (36.8 C)-98.6 F (37 C)] 98.3 F (36.8 C) (12/01 0619) Pulse Rate:  [65-67] 65  (12/01 0619) Resp:  [16-20] 20  (12/01 0619) BP: (136-148)/(63-72) 148/65 mmHg (12/01 0619) SpO2:  [96 %-100 %] 96 % (12/01 0816) Weight change:  Last BM Date: 09/13/11  Intake/Output from previous day: 11/30 0701 - 12/01 0700 In: 540 [P.O.:540] Out: 5100 [Urine:5100] Intake/Output this shift:    General appearance: alert, cooperative and no significant distress, minimal increased WOB  Head: Normocephalic, without obvious abnormality, atraumatic Throat: lips, mucosa, and tongue normal; teeth and gums normal and large neck girth Resp: diffuse expiratory wheezes bilaterally, adequate resp effort  Chest wall: no tenderness Cardio: regular rate and rhythm, S1, S2 normal, no murmur, click, rub or gallop Extremities: 2+ peripheral pulses, 1-2+ edema  Skin: Skin color, texture, turgor normal. No rashes or lesions or mild generalized pallor   Lab Results:  Basename 09/14/11 0500 09/13/11 0500  WBC 6.5 6.6  HGB 8.2* 7.8*  HCT 28.0* 27.2*  PLT 128* 128*   BMET  Basename 09/13/11 0500 09/12/11 0420  NA 142 138  K 3.8 3.7  CL 93* 94*  CO2 41* 39*  GLUCOSE 92 138*  BUN 14 14  CREATININE 1.07 0.93  CALCIUM 8.8 8.6    Studies/Results: No results found.  Medications: I have reviewed the patient's current medications.  Assessment/Plan: 13 YOF with baseline hx/o CAD, diastolic CHF, COPD (on 4L at home) who presented with NSTEMI  CV: Cardiac chest pain clincally resolved. Now with pleuritic CP in setting of COPD, CHF, OHS S/p PTCA to ostial and  proximal CF. Multiple stents.  Continue ASA, Plavix, ACEI, Crestor 40, bisoprolol. Will continue with medical management per cards recs. Anticipate d/c soon.   PULM:  -baseline hx/o CHF and COPD as well as likely obesity hypoventilation syndrome.  -Diuresing well. Noted 4L diuresis overnight. Also with 6kg weight loss since admission.  - continue lasix 40mg  BID and nebs treatement  - on 4L O2 via n/c. This is her home regimen.  -Pt may also benefit from BiPAP given body habitus. ?oupt pulm c/s vs starting BiPAP inhouse.   Heme:  -Anemia back at baseline. Will continue to follow.   ENDO:  -SSI   FEN/GI: -IV lasix, heart healthy diet.  -Will transition to low fat diet as this may help with baseline GB disease.  -metabolic alkalosis: likely multifactorial with compensated resp acidosis as well as diuretic therapy. Pt is still clinically edematous.   ID: -UTI with no growth.   Psych: -Off home meds currently  -Will monitor for now. Avoid BDZ due to respiratory issues.   Social  Patient with financial concerns about paying for hospital bills and living expenses. SW has been assisting with the patient's living situation.   PPx Heparin SQ  Dispo Pending clinical improvement of respiratory status. Anticipate possible d/c tomorrow vs. Later today.      LOS: 4 days   Carolin Quang 09/14/2011, 8:58 AM

## 2011-09-14 NOTE — Progress Notes (Signed)
CRITICAL VALUE ALERT  Critical value received:  co2  Date of notification:  09/14/2011  Time of notification:  0920  Critical value read back:yes  Nurse who received alert:  Aiden Helzer L.Ria Bush RN,BSN  MD notified (1st page):  Sarah [place]   Time of first page:  [phone]  MD notified (2nd page):  Time of second page:  Responding MD:  funches  Time MD responded:  [phone]

## 2011-09-15 NOTE — Discharge Summary (Signed)
Family Medicine Teaching Service  Discharge Note : Attending Evgenia Merriman MD Pager [phone] Office [phone] I have seen and examined this patient, reviewed their chart and discussed discharge planning wit the resident. I agree with the discharge plan as above.  

## 2011-09-18 ENCOUNTER — Encounter (HOSPITAL_COMMUNITY): Payer: Self-pay | Admitting: *Deleted

## 2011-09-18 ENCOUNTER — Inpatient Hospital Stay (HOSPITAL_COMMUNITY)
Admission: EM | Admit: 2011-09-18 | Discharge: 2011-09-24 | DRG: 563 | Disposition: A | Discharge status: Skilled Nursing Facility | Payer: Medicare Other | Attending: Family Medicine | Admitting: Family Medicine

## 2011-09-18 ENCOUNTER — Other Ambulatory Visit: Payer: Self-pay

## 2011-09-18 DIAGNOSIS — R531 Weakness: Secondary | ICD-10-CM

## 2011-09-18 DIAGNOSIS — S92919A Unspecified fracture of unspecified toe(s), initial encounter for closed fracture: Principal | ICD-10-CM | POA: Diagnosis present

## 2011-09-18 DIAGNOSIS — Z86711 Personal history of pulmonary embolism: Secondary | ICD-10-CM

## 2011-09-18 DIAGNOSIS — I214 Non-ST elevation (NSTEMI) myocardial infarction: Secondary | ICD-10-CM | POA: Diagnosis present

## 2011-09-18 DIAGNOSIS — I2510 Atherosclerotic heart disease of native coronary artery without angina pectoris: Secondary | ICD-10-CM | POA: Diagnosis present

## 2011-09-18 DIAGNOSIS — D649 Anemia, unspecified: Secondary | ICD-10-CM

## 2011-09-18 DIAGNOSIS — F341 Dysthymic disorder: Secondary | ICD-10-CM | POA: Diagnosis present

## 2011-09-18 DIAGNOSIS — Y998 Other external cause status: Secondary | ICD-10-CM

## 2011-09-18 DIAGNOSIS — Z7982 Long term (current) use of aspirin: Secondary | ICD-10-CM

## 2011-09-18 DIAGNOSIS — R5383 Other fatigue: Secondary | ICD-10-CM | POA: Diagnosis present

## 2011-09-18 DIAGNOSIS — S91309A Unspecified open wound, unspecified foot, initial encounter: Secondary | ICD-10-CM | POA: Diagnosis present

## 2011-09-18 DIAGNOSIS — G609 Hereditary and idiopathic neuropathy, unspecified: Secondary | ICD-10-CM | POA: Diagnosis present

## 2011-09-18 DIAGNOSIS — G253 Myoclonus: Secondary | ICD-10-CM | POA: Diagnosis present

## 2011-09-18 DIAGNOSIS — B961 Klebsiella pneumoniae [K. pneumoniae] as the cause of diseases classified elsewhere: Secondary | ICD-10-CM | POA: Diagnosis present

## 2011-09-18 DIAGNOSIS — Z9861 Coronary angioplasty status: Secondary | ICD-10-CM

## 2011-09-18 DIAGNOSIS — K5900 Constipation, unspecified: Secondary | ICD-10-CM | POA: Diagnosis present

## 2011-09-18 DIAGNOSIS — J449 Chronic obstructive pulmonary disease, unspecified: Secondary | ICD-10-CM | POA: Diagnosis present

## 2011-09-18 DIAGNOSIS — R5381 Other malaise: Secondary | ICD-10-CM | POA: Diagnosis present

## 2011-09-18 DIAGNOSIS — E118 Type 2 diabetes mellitus with unspecified complications: Secondary | ICD-10-CM

## 2011-09-18 DIAGNOSIS — R296 Repeated falls: Secondary | ICD-10-CM | POA: Diagnosis present

## 2011-09-18 DIAGNOSIS — N390 Urinary tract infection, site not specified: Secondary | ICD-10-CM | POA: Diagnosis present

## 2011-09-18 DIAGNOSIS — Z86718 Personal history of other venous thrombosis and embolism: Secondary | ICD-10-CM

## 2011-09-18 DIAGNOSIS — M549 Dorsalgia, unspecified: Secondary | ICD-10-CM | POA: Diagnosis present

## 2011-09-18 DIAGNOSIS — J4489 Other specified chronic obstructive pulmonary disease: Secondary | ICD-10-CM | POA: Diagnosis present

## 2011-09-18 DIAGNOSIS — E119 Type 2 diabetes mellitus without complications: Secondary | ICD-10-CM | POA: Diagnosis present

## 2011-09-18 DIAGNOSIS — E7800 Pure hypercholesterolemia, unspecified: Secondary | ICD-10-CM | POA: Diagnosis present

## 2011-09-18 DIAGNOSIS — S92919B Unspecified fracture of unspecified toe(s), initial encounter for open fracture: Secondary | ICD-10-CM

## 2011-09-18 NOTE — ED Notes (Signed)
Dr. Delo at the bedside 

## 2011-09-18 NOTE — ED Notes (Addendum)
Received pt. In room 1 via EMS with complaints of tremor that started yesterday and fall with laceration to left foot, tonight. Recent admit. For heart with angioplasty per EMS

## 2011-09-18 NOTE — ED Notes (Signed)
Family at bedside. 

## 2011-09-18 NOTE — ED Provider Notes (Signed)
History     CSN: [id number] Arrival date & time: 09/18/2011  9:36 PM   First MD Initiated Contact with Patient 09/18/11 2336      Chief Complaint  Patient presents with  . Fall  . Tremors  . Extremity Laceration    left foot    (Consider location/radiation/quality/duration/timing/severity/associated sxs/prior treatment) HPI Comments: Patient recently discharged from [facility] where she was admitted for angioplasty.  She had several medications changed.  She was discharged three days ago.  Since that time, she has become more shaky, tremulous.  Today she tried to get from bed to wheelchair and fell.  She lacerated her toes on the left foot.  Can't stop shaking.    Patient is a 70 y.o. female presenting with fall. The history is provided by the patient and a relative (son).  Fall The accident occurred less than 1 hour ago. The fall occurred while walking.    Past Medical History  Diagnosis Date  . PE (pulmonary embolism)   . DVT (deep venous thrombosis)   . Diabetes mellitus   . Asthma   . Hypertension   . CAD (coronary artery disease)     s/p pci rca in past; nstemi 12/2009 with rca occlusion/isr - 2.75 x 18-mm PROMUS DES placed.  residual ostial lcx dzs  . GERD (gastroesophageal reflux disease)   . COPD (chronic obstructive pulmonary disease)   . Obesity   . Tardive dyskinesia   . History of renal failure   . Depression   . Borderline personality disorder   . Anxiety   . Anemia     hx of blood transfusion x 3    Past Surgical History  Procedure Date  . US echocardiography 2008  . Orif ankle fracture   . Appendectomy   . Cervical discectomy   . Ptca 06/2010  . Tonsillectomy   . Transthoracic echocardiogram 06/2010    Grade 1 diastolic, EF 55-60    Family History  Problem Relation Age of Onset  . Breast cancer      aunt  . Alcohol abuse Son   . Cancer Daughter   . Leukemia Father     deceased  . Alzheimer's disease Mother     deceased    History  Substance  Use Topics  . Smoking status: Passive Smoker  . Smokeless tobacco: Never Used   Comment: never smoked - children smoke - lives with son.  . Alcohol Use: No    OB History    Grav Para Term Preterm Abortions TAB SAB Ect Mult Living                  Review of Systems  All other systems reviewed and are negative.    Allergies  Ibuprofen; Amlodipine besylate; Amoxicillin; Codeine; Cortisone; Haloperidol lactate; and Naproxen  Home Medications   Current Outpatient Rx  Name Route Sig Dispense Refill  . ALBUTEROL SULFATE (2.5 MG/3ML) 0.083% IN NEBU Nebulization Take 2.5 mg by nebulization every 6 (six) hours as needed. Do not use with albuterol inhaler.     . ASPIRIN 81 MG PO TBEC Oral Take 81 mg by mouth daily.      Marland Kitchen BISOPROLOL FUMARATE 5 MG PO TABS Oral Take 5 mg by mouth daily.      Marland Kitchen CLOPIDOGREL BISULFATE 75 MG PO TABS Oral Take 75 mg by mouth daily.      Marland Kitchen DOCUSATE SODIUM 100 MG PO CAPS Oral Take 1 capsule (100 mg total) by mouth 2 (  two) times daily. 100 capsule 1  . FERROUS SULFATE 325 (65 FE) MG PO TABS Oral Take 1 tablet (325 mg total) by mouth daily. 90 tablet 2  . INSULIN ASPART 100 UNIT/ML [place] SOLN Subcutaneous Inject 16-24 Units into the skin 3 (three) times daily before meals. 16 units prior to breakfast, 20 units prior to lunch, 24 prior units to dinner     . INSULIN GLARGINE 100 UNIT/ML [place] SOLN Subcutaneous Inject 55 Units into the skin daily.      Marland Kitchen LISINOPRIL 5 MG PO TABS Oral Take 1 tablet (5 mg total) by mouth daily. 90 tablet 0  . METOCLOPRAMIDE HCL 5 MG PO TABS Oral Take 5 mg by mouth 4 (four) times daily.      . MORPHINE SULFATE ER 15 MG PO TB12 Oral Take 1 tablet (15 mg total) by mouth 3 (three) times daily. 90 tablet 0  . SENIOR MULTIVITAMIN PLUS PO TABS Oral Take 1 tablet by mouth daily. 100 tablet 1  . NITROGLYCERIN 0.4 MG SL SUBL Sublingual Place 1 tablet (0.4 mg total) under the tongue every 5 (five) minutes as needed for chest pain. 30 tablet 3  . NYSTATIN  100000 UNIT/GM EX POWD Topical Apply 1 Units topically 2 (two) times daily. Apply to affected area      . NYSTATIN 100000 UNIT/GM EX CREA Topical Apply 1 application topically 2 (two) times daily.      Marland Kitchen OMEPRAZOLE 40 MG PO CPDR Oral Take 40 mg by mouth daily.      Marland Kitchen ONDANSETRON HCL 4 MG PO TABS Oral Take 4 mg by mouth every 6 (six) hours as needed.      Marland Kitchen PROMETHAZINE HCL 25 MG PO TABS Oral Take 1 tablet (25 mg total) by mouth every 6 (six) hours as needed for nausea. 120 tablet 3  . RANITIDINE HCL 150 MG PO CAPS Oral Take 1 capsule (150 mg total) by mouth 2 (two) times daily. 120 capsule 2  . ROSUVASTATIN CALCIUM 40 MG PO TABS Oral Take 40 mg by mouth daily.      . SENNOSIDES 8.6 MG PO TABS Oral Take 3 tablets by mouth daily.      . SUCRALFATE 1 G PO TABS Oral Take 1 g by mouth 3 (three) times daily before meals.      . TORSEMIDE 20 MG PO TABS Oral Take 20 mg by mouth daily.        BP 96/45  Pulse 67  Temp(Src) 98.3 F (36.8 C) (Oral)  Resp 11  SpO2 100%  Physical Exam  Constitutional: She is oriented to person, place, and time. She appears well-developed and well-nourished.       Obese female, nad.  HENT:  Head: Normocephalic and atraumatic.  Eyes: Conjunctivae are normal. Pupils are equal, round, and reactive to light.  Neck: Normal range of motion. Neck supple.  Cardiovascular: Normal rate and regular rhythm.  Exam reveals no friction rub.   No murmur heard. Pulmonary/Chest: Effort normal and breath sounds normal. No respiratory distress. She has no wheezes.  Abdominal: Soft. Bowel sounds are normal. She exhibits no distension. There is no tenderness.  Musculoskeletal: Normal range of motion.       The 1st and 4th toes of the left foot are lacerated on the inferior aspect.  Neurological: She is alert and oriented to person, place, and time. No cranial nerve deficit.       Somewhat tremulous.    Skin: Skin is warm and  dry.    ED Course  Procedures (including critical care  time)   Labs Reviewed  CBC  DIFFERENTIAL  COMPREHENSIVE METABOLIC PANEL  CK TOTAL AND CKMB  TROPONIN I   No results found.   No diagnosis found.    MDM  The labs show an anemia which she had while here for her stents.  I suspect her weakness is multi-factorial with anemia, medication changes, and deconditioning each playing a role.  I believe she should be admitted as she is at risk for falls and injury.  She also has fractures of several toes with lacerations overlying them.  Will give kefzol.          Geoffery Lyons, MD 09/19/11 [phone]

## 2011-09-19 ENCOUNTER — Other Ambulatory Visit: Payer: Self-pay

## 2011-09-19 ENCOUNTER — Inpatient Hospital Stay: Payer: Medicare Other | Admitting: Family Medicine

## 2011-09-19 ENCOUNTER — Emergency Department (HOSPITAL_COMMUNITY): Payer: Medicare Other

## 2011-09-19 ENCOUNTER — Encounter (HOSPITAL_COMMUNITY): Payer: Self-pay | Admitting: Nurse Practitioner

## 2011-09-19 DIAGNOSIS — S92909A Unspecified fracture of unspecified foot, initial encounter for closed fracture: Secondary | ICD-10-CM

## 2011-09-19 DIAGNOSIS — R269 Unspecified abnormalities of gait and mobility: Secondary | ICD-10-CM

## 2011-09-19 LAB — GLUCOSE, CAPILLARY
Glucose-Capillary: 79 mg/dL (ref 70–99)
Glucose-Capillary: 230 mg/dL — ABNORMAL HIGH (ref 70–99)

## 2011-09-19 LAB — URINALYSIS, ROUTINE W REFLEX MICROSCOPIC
Bilirubin Urine: NEGATIVE
Bilirubin Urine: NEGATIVE
Glucose, UA: NEGATIVE mg/dL
Ketones, ur: NEGATIVE mg/dL
Ketones, ur: NEGATIVE mg/dL
Protein, ur: NEGATIVE mg/dL
Specific Gravity, Urine: 1.01 (ref 1.005–1.030)
Urobilinogen, UA: 0.2 mg/dL (ref 0.0–1.0)
pH: 6 (ref 5.0–8.0)
pH: 5 (ref 5.0–8.0)

## 2011-09-19 LAB — COMPREHENSIVE METABOLIC PANEL
Albumin: 2.6 g/dL — ABNORMAL LOW (ref 3.5–5.2)
Albumin: 2.8 g/dL — ABNORMAL LOW (ref 3.5–5.2)
Alkaline Phosphatase: 52 U/L (ref 39–117)
BUN: 20 mg/dL (ref 6–23)
BUN: 21 mg/dL (ref 6–23)
Calcium: 9 mg/dL (ref 8.4–10.5)
Chloride: 92 mEq/L — ABNORMAL LOW (ref 96–112)
Chloride: 95 mEq/L — ABNORMAL LOW (ref 96–112)
Creatinine, Ser: 1.16 mg/dL — ABNORMAL HIGH (ref 0.50–1.10)
Creatinine, Ser: 1.2 mg/dL — ABNORMAL HIGH (ref 0.50–1.10)
GFR calc Af Amer: 53 mL/min — ABNORMAL LOW (ref >90)
GFR calc non Af Amer: 46 mL/min — ABNORMAL LOW (ref >90)
Glucose, Bld: 90 mg/dL (ref 70–99)
Potassium: 4.5 mEq/L (ref 3.5–5.1)
Total Bilirubin: 0.2 mg/dL — ABNORMAL LOW (ref 0.3–1.2)
Total Bilirubin: 0.2 mg/dL — ABNORMAL LOW (ref 0.3–1.2)

## 2011-09-19 LAB — CBC
HCT: 24.9 % — ABNORMAL LOW (ref 36.0–46.0)
HCT: 26.9 % — ABNORMAL LOW (ref 36.0–46.0)
Hemoglobin: 7.5 g/dL — ABNORMAL LOW (ref 12.0–15.0)
MCH: 23.4 pg — ABNORMAL LOW (ref 26.0–34.0)
MCH: 24 pg — ABNORMAL LOW (ref 26.0–34.0)
MCHC: 27.9 g/dL — ABNORMAL LOW (ref 30.0–36.0)
MCHC: 28.1 g/dL — ABNORMAL LOW (ref 30.0–36.0)
MCV: 85.3 fL (ref 78.0–100.0)
Platelets: 130 10*3/uL — ABNORMAL LOW (ref 150–400)
RBC: 3.2 MIL/uL — ABNORMAL LOW (ref 3.87–5.11)
RDW: 14.6 % (ref 11.5–15.5)
WBC: 7.1 10*3/uL (ref 4.0–10.5)

## 2011-09-19 LAB — DIFFERENTIAL
Basophils Relative: 0 % (ref 0–1)
Lymphs Abs: 0.9 10*3/uL (ref 0.7–4.0)
Monocytes Absolute: 0.4 10*3/uL (ref 0.1–1.0)
Monocytes Relative: 4 % (ref 3–12)
Neutro Abs: 8.6 10*3/uL — ABNORMAL HIGH (ref 1.7–7.7)

## 2011-09-19 LAB — CK TOTAL AND CKMB (NOT AT ARMC)
CK, MB: 2.7 ng/mL (ref 0.3–4.0)
Total CK: 32 U/L (ref 7–177)

## 2011-09-19 LAB — TYPE AND SCREEN: Antibody Screen: NEGATIVE

## 2011-09-19 LAB — URINE MICROSCOPIC-ADD ON

## 2011-09-19 MED ORDER — BISOPROLOL FUMARATE 5 MG PO TABS
5.0000 mg | ORAL_TABLET | Freq: Every day | ORAL | Status: DC
  Filled 2011-09-19: qty 1

## 2011-09-19 MED ORDER — TORSEMIDE 20 MG PO TABS: 20.0000 mg | ORAL_TABLET | Freq: Every day | ORAL | Status: DC

## 2011-09-19 MED ORDER — FERROUS SULFATE 325 (65 FE) MG PO TABS
325.0000 mg | ORAL_TABLET | Freq: Every day | ORAL | Status: DC
  Administered 2011-09-19 – 2011-09-24 (×6): 325 mg via ORAL
  Filled 2011-09-19 (×6): qty 1

## 2011-09-19 MED ORDER — HEPARIN SODIUM (PORCINE) 5000 UNIT/ML IJ SOLN
5000.0000 [IU] | Freq: Three times a day (TID) | INTRAMUSCULAR | Status: DC
  Administered 2011-09-19 – 2011-09-24 (×17): 5000 [IU] via SUBCUTANEOUS
  Filled 2011-09-19 (×22): qty 1

## 2011-09-19 MED ORDER — TORSEMIDE 20 MG PO TABS
20.0000 mg | ORAL_TABLET | Freq: Every day | ORAL | Status: DC
  Administered 2011-09-19 – 2011-09-24 (×6): 20 mg via ORAL
  Filled 2011-09-19 (×6): qty 1

## 2011-09-19 MED ORDER — ACETAMINOPHEN 325 MG PO TABS
650.0000 mg | ORAL_TABLET | Freq: Four times a day (QID) | ORAL | Status: DC | PRN
  Administered 2011-09-20 – 2011-09-23 (×5): 650 mg via ORAL
  Filled 2011-09-19 (×3): qty 2
  Filled 2011-09-19: qty 1
  Filled 2011-09-19: qty 2

## 2011-09-19 MED ORDER — ACETAMINOPHEN 650 MG RE SUPP: 650.0000 mg | Freq: Four times a day (QID) | RECTAL | Status: DC | PRN

## 2011-09-19 MED ORDER — DOCUSATE SODIUM 100 MG PO CAPS
100.0000 mg | ORAL_CAPSULE | Freq: Two times a day (BID) | ORAL | Status: DC
  Administered 2011-09-19 – 2011-09-24 (×11): 100 mg via ORAL
  Filled 2011-09-19 (×13): qty 1

## 2011-09-19 MED ORDER — LISINOPRIL 5 MG PO TABS
5.0000 mg | ORAL_TABLET | Freq: Every day | ORAL | Status: DC
  Administered 2011-09-19 – 2011-09-24 (×6): 5 mg via ORAL
  Filled 2011-09-19 (×6): qty 1

## 2011-09-19 MED ORDER — CLOPIDOGREL BISULFATE 75 MG PO TABS
75.0000 mg | ORAL_TABLET | Freq: Every day | ORAL | Status: DC
  Administered 2011-09-19 – 2011-09-24 (×6): 75 mg via ORAL
  Filled 2011-09-19 (×6): qty 1

## 2011-09-19 MED ORDER — BISOPROLOL FUMARATE 5 MG PO TABS
5.0000 mg | ORAL_TABLET | Freq: Every day | ORAL | Status: DC
  Administered 2011-09-19 – 2011-09-24 (×6): 5 mg via ORAL
  Filled 2011-09-19 (×6): qty 1

## 2011-09-19 MED ORDER — NYSTATIN 100000 UNIT/GM EX POWD
100000.0000 [IU] | Freq: Two times a day (BID) | CUTANEOUS | Status: DC
  Administered 2011-09-20 – 2011-09-24 (×8): 100000 [IU] via TOPICAL
  Filled 2011-09-19 (×2): qty 15

## 2011-09-19 MED ORDER — INSULIN ASPART 100 UNIT/ML ~~LOC~~ SOLN
0.0000 [IU] | Freq: Three times a day (TID) | SUBCUTANEOUS | Status: DC
  Administered 2011-09-19: 2 [IU] via SUBCUTANEOUS
  Administered 2011-09-19 – 2011-09-20 (×2): 3 [IU] via SUBCUTANEOUS
  Administered 2011-09-20 (×2): 5 [IU] via SUBCUTANEOUS
  Administered 2011-09-21: 3 [IU] via SUBCUTANEOUS
  Administered 2011-09-21: 5 [IU] via SUBCUTANEOUS
  Administered 2011-09-21: 3 [IU] via SUBCUTANEOUS
  Filled 2011-09-19: qty 3

## 2011-09-19 MED ORDER — SODIUM CHLORIDE 0.9 % IV SOLN: 250.0000 mL | INTRAVENOUS | Status: DC | PRN

## 2011-09-19 MED ORDER — BIOTENE DRY MOUTH MT LIQD
15.0000 mL | Freq: Two times a day (BID) | OROMUCOSAL | Status: DC
  Administered 2011-09-20 – 2011-09-22 (×6): 15 mL via OROMUCOSAL

## 2011-09-19 MED ORDER — ROSUVASTATIN CALCIUM 40 MG PO TABS
40.0000 mg | ORAL_TABLET | Freq: Every day | ORAL | Status: DC
  Filled 2011-09-19: qty 1

## 2011-09-19 MED ORDER — ALBUTEROL SULFATE (5 MG/ML) 0.5% IN NEBU: 2.5000 mg | INHALATION_SOLUTION | RESPIRATORY_TRACT | Status: DC | PRN

## 2011-09-19 MED ORDER — PANTOPRAZOLE SODIUM 40 MG PO TBEC
40.0000 mg | DELAYED_RELEASE_TABLET | Freq: Every day | ORAL | Status: DC
  Administered 2011-09-19 – 2011-09-24 (×6): 40 mg via ORAL
  Filled 2011-09-19 (×5): qty 1

## 2011-09-19 MED ORDER — INSULIN ASPART 100 UNIT/ML ~~LOC~~ SOLN
3.0000 [IU] | Freq: Three times a day (TID) | SUBCUTANEOUS | Status: DC
  Administered 2011-09-19 – 2011-09-21 (×7): 3 [IU] via SUBCUTANEOUS

## 2011-09-19 MED ORDER — FAMOTIDINE 20 MG PO TABS
20.0000 mg | ORAL_TABLET | Freq: Every day | ORAL | Status: DC
  Administered 2011-09-20 – 2011-09-24 (×5): 20 mg via ORAL
  Filled 2011-09-19 (×5): qty 1

## 2011-09-19 MED ORDER — ROSUVASTATIN CALCIUM 40 MG PO TABS
40.0000 mg | ORAL_TABLET | Freq: Every day | ORAL | Status: DC
  Administered 2011-09-19 – 2011-09-24 (×6): 40 mg via ORAL
  Filled 2011-09-19 (×6): qty 1

## 2011-09-19 MED ORDER — SUCRALFATE 1 G PO TABS
1.0000 g | ORAL_TABLET | Freq: Three times a day (TID) | ORAL | Status: DC
  Administered 2011-09-19 – 2011-09-24 (×17): 1 g via ORAL
  Filled 2011-09-19 (×21): qty 1

## 2011-09-19 MED ORDER — METOCLOPRAMIDE HCL 5 MG PO TABS
5.0000 mg | ORAL_TABLET | Freq: Three times a day (TID) | ORAL | Status: DC
  Administered 2011-09-19 – 2011-09-24 (×21): 5 mg via ORAL
  Filled 2011-09-19 (×26): qty 1

## 2011-09-19 MED ORDER — SENNA 8.6 MG PO TABS
3.0000 | ORAL_TABLET | Freq: Every evening | ORAL | Status: DC | PRN
  Administered 2011-09-20: 25.8 mg via ORAL
  Filled 2011-09-19: qty 3

## 2011-09-19 MED ORDER — INSULIN ASPART 100 UNIT/ML ~~LOC~~ SOLN
0.0000 [IU] | Freq: Every day | SUBCUTANEOUS | Status: DC
  Administered 2011-09-19 – 2011-09-20 (×2): 2 [IU] via SUBCUTANEOUS

## 2011-09-19 MED ORDER — SODIUM CHLORIDE 0.9 % IJ SOLN: 3.0000 mL | INTRAMUSCULAR | Status: DC | PRN

## 2011-09-19 MED ORDER — ASPIRIN EC 81 MG PO TBEC
81.0000 mg | DELAYED_RELEASE_TABLET | Freq: Every day | ORAL | Status: DC
  Administered 2011-09-19 – 2011-09-24 (×6): 81 mg via ORAL
  Filled 2011-09-19 (×8): qty 1

## 2011-09-19 MED ORDER — INSULIN GLARGINE 100 UNIT/ML ~~LOC~~ SOLN
55.0000 [IU] | Freq: Every day | SUBCUTANEOUS | Status: DC
  Filled 2011-09-19: qty 3

## 2011-09-19 MED ORDER — CEFAZOLIN SODIUM 1-5 GM-% IV SOLN
1.0000 g | Freq: Once | INTRAVENOUS | Status: AC
  Administered 2011-09-19: 1 g via INTRAVENOUS
  Filled 2011-09-19: qty 50

## 2011-09-19 MED ORDER — SODIUM CHLORIDE 0.9 % IJ SOLN
3.0000 mL | Freq: Two times a day (BID) | INTRAMUSCULAR | Status: DC
  Administered 2011-09-19 – 2011-09-24 (×11): 3 mL via INTRAVENOUS

## 2011-09-19 MED ORDER — MORPHINE SULFATE CR 15 MG PO TB12
15.0000 mg | ORAL_TABLET | Freq: Three times a day (TID) | ORAL | Status: DC
  Administered 2011-09-19 – 2011-09-24 (×18): 15 mg via ORAL
  Filled 2011-09-19 (×18): qty 1

## 2011-09-19 MED ORDER — ONDANSETRON HCL 4 MG PO TABS
4.0000 mg | ORAL_TABLET | Freq: Four times a day (QID) | ORAL | Status: DC | PRN
  Administered 2011-09-23: 4 mg via ORAL
  Filled 2011-09-19: qty 1

## 2011-09-19 MED ORDER — INSULIN GLARGINE 100 UNIT/ML ~~LOC~~ SOLN
30.0000 [IU] | Freq: Every day | SUBCUTANEOUS | Status: DC
  Administered 2011-09-19 – 2011-09-20 (×2): 30 [IU] via SUBCUTANEOUS

## 2011-09-19 NOTE — ED Notes (Signed)
Pt. Returned from X-ray, NAD noted 

## 2011-09-19 NOTE — ED Notes (Signed)
Admit team at the bedside.

## 2011-09-19 NOTE — Progress Notes (Signed)
Pt's C02 40.  Result called in to Dr. Aviva Signs.  No new orders given.  Will cont. To monitor.

## 2011-09-19 NOTE — Consult Note (Signed)
WOC consult Note Reason for Consult: Consult requested for left foot.  Pt states she broke it this AM.  This is consistent with Xray results.  Ortho notes indicate they will follow patient tomorrow for further plan of care. Wound type: No open wound or drainage.  4th left toe with small partial thickness abrasion. Measurement: .2X.2X.1cm Wound bed: dk purple-red Drainage (amount, consistency, odor) small bloody drainage, tip of toe black. Dressing procedure/placement/frequency: Foam dressing to protect site.  Will not plan to follow further unless re-consulted.  [address], RN, MSN, [employer]  [phone]
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
## 2011-09-19 NOTE — Progress Notes (Signed)
Note opened in error.

## 2011-09-19 NOTE — Progress Notes (Signed)
Physical Therapy Cancellation  Patient Details Name: Holly Kim MRN: [id number] DOB: Sep 22, 1941 Today's Date: 09/19/2011  PT order received however pt currently on strict bed rest.  Pt Hgb is also 7.0 (nontherapuetic range).  Will need updated activity order.  Thanks!!!  Rosemary Pentecost 09/19/2011, 8:38 AM [phone]

## 2011-09-19 NOTE — Progress Notes (Signed)
Pt consulted with Dr. Dorene Grebe from [facility]. He looked the xray and recommended wound consult and he will be coming to see pt tomorrow as he does not think this is an open fracture.

## 2011-09-19 NOTE — ED Notes (Signed)
Pt. Admitted to hospital, transferred to floor via stretcher, NAD noted

## 2011-09-19 NOTE — H&P (Signed)
I interviewed and examined this patient and discussed the care plan with Dr.Corey and the FPTS team and agree with assessment and plan as documented in the admission note for last evening. Her pain complaints this morning relate to the left foot and her low back. She denies any UTI specific symptoms, but is on Cephalexin per the ER. This should cover any concern for an open fracture of the left forefoot. If the cath urine culture confirm infection or colonization, and it is decided to treat for infection, the antibiotic should be continued at least 7 days. Disposition may depend on her ability to do transfers.     Icey Tello A. Sheffield Slider, MD Family Medicine Teaching Service Attending  09/19/2011 8:52 AM

## 2011-09-19 NOTE — Progress Notes (Signed)
Noted order for [facility], please clarify order to be specific as to [facility] needed, must order OT,PT and/or RN if needed.

## 2011-09-19 NOTE — ED Notes (Signed)
Dr. Sid Falcon at the bedside

## 2011-09-20 LAB — URINE CULTURE
Colony Count: >=100000
Culture  Setup Time: 201212060308

## 2011-09-20 LAB — GLUCOSE, CAPILLARY
Glucose-Capillary: 208 mg/dL — ABNORMAL HIGH (ref 70–99)
Glucose-Capillary: 210 mg/dL — ABNORMAL HIGH (ref 70–99)
Glucose-Capillary: 228 mg/dL — ABNORMAL HIGH (ref 70–99)

## 2011-09-20 LAB — CBC
HCT: 26.7 % — ABNORMAL LOW (ref 36.0–46.0)
Hemoglobin: 7.7 g/dL — ABNORMAL LOW (ref 12.0–15.0)
MCH: 24.1 pg — ABNORMAL LOW (ref 26.0–34.0)
MCHC: 28.8 g/dL — ABNORMAL LOW (ref 30.0–36.0)
MCV: 83.4 fL (ref 78.0–100.0)

## 2011-09-20 NOTE — Progress Notes (Signed)
Pt. Consult received, noted pt. Continues to be on strict bed rest and also will await ortho consult. Will re-attempt at later time. Thanks!  Cassandria Anger, OTR/L Pager: [phone] 09/20/2011 .

## 2011-09-20 NOTE — Progress Notes (Signed)
Family Medicine Teaching Service Attending Note  I interviewed and examined patient Holly Kim and reviewed their tests and x-rays.  I discussed with Dr. Aviva Signs and reviewed their note for today.  I agree with their assessment and plan.     Additionally  She will need to go to SNF (which she doe not want) unless able to use bed side commode.  Trial of this today.   No signs of secondary infection but need to watch closely

## 2011-09-20 NOTE — Progress Notes (Signed)
Clinical Child psychotherapist (CSW) CSW completed psychosocial assessment which can be found in pt shadow chart. CSW assessed for placement as pt is agreeable to skilled nursing facility. Pt has a history of falls and constant challenges with keeping electricity/gas on at her house do to being unable to afford expenses. Pt states she prefers to go to [facility] or [facility]. Pt son and son gf in room during assessment who are also in agreement with pt going to a skilled nursing facility in order to get the necessary therapy she needs. CSW will complete FL2 and fax pt out to preferred facilities. [name], MSW, Theresia Majors [phone]

## 2011-09-20 NOTE — Progress Notes (Deleted)
CM secretary able to speak with pts Medicare Part D provider at [facility] and pt will need to pay $253 initially to meet deductiable then would have copay of $3 per month afterward.  This CM concerned that $253 may be more than this pt can afford, perhaps another medication would be appropriate.  CRoyal RN MPH

## 2011-09-20 NOTE — Progress Notes (Signed)
Inpatient Diabetes Program Recommendations  AACE/ADA: New Consensus Statement on Inpatient Glycemic Control (2009)  Target Ranges:  Prepandial:   less than 140 mg/dL      Peak postprandial:   less than 180 mg/dL (1-2 hours)      Critically ill patients:  140 - 180 mg/dL   Reason for Visit: CBGs greater than 180 mg/dl  Inpatient Diabetes Program Recommendations Insulin - Basal: Increase Lantus to 40 units daily Insulin - Meal Coverage: Increase Novolog meal coverage to 6 units TID   Note: Titrate insulin as needed

## 2011-09-20 NOTE — Consult Note (Signed)
Reason for Consult: Left toe pain  Referring Physician: Dr. Claudean Kinds  Holly Kim is an 70 y.o. female.  HPI: Holly Kim is a 70 year old female who fell 2 days before admission and injured her left foot and toes. Her helper was trying to assist her in getting from the potty chair to the bed when her left foot toes curled under her. The patient felt some popping and had bleeding. The bleeding was coming primarily from the plantar surface of the fourth toe. The patient has been able to ambulate. She denies any fever or chills.  Past Medical History  Diagnosis Date  . PE (pulmonary embolism)   . DVT (deep venous thrombosis)   . Diabetes mellitus   . Asthma   . Hypertension   . CAD (coronary artery disease)     s/p pci rca in past; nstemi 12/2009 with rca occlusion/isr - 2.75 x 18-mm PROMUS DES placed.  residual ostial lcx dzs  . GERD (gastroesophageal reflux disease)   . COPD (chronic obstructive pulmonary disease)   . Obesity   . Tardive dyskinesia   . History of renal failure   . Depression   . Borderline personality disorder   . Anxiety   . Anemia     hx of blood transfusion x 3    Past Surgical History  Procedure Date  . US echocardiography 2008  . Orif ankle fracture   . Appendectomy   . Cervical discectomy   . Ptca 06/2010  . Tonsillectomy   . Transthoracic echocardiogram 06/2010    Grade 1 diastolic, EF 55-60    Family History  Problem Relation Age of Onset  . Breast cancer      aunt  . Alcohol abuse Son   . Cancer Daughter   . Leukemia Father     deceased  . Alzheimer's disease Mother     deceased    Social History:  reports that she has been passively smoking.  She has never used smokeless tobacco. She reports that she does not drink alcohol or use illicit drugs.  Allergies:  Allergies  Allergen Reactions  . Ibuprofen     REACTION: "mouth swells up and I can't swallow good"  . Amlodipine Besylate     REACTION: hypotension  .  Amoxicillin     REACTION: itching  . Codeine Itching  . Cortisone Other (See Comments)    Pt states she gets "blood poisoning where they give me the shots of cortisone"  . Haloperidol Lactate     REACTION: My head gets places before my body  . Naproxen     REACTION: rash    Medications: I have reviewed the patient's current medications.  Results for orders placed during the hospital encounter of 09/18/11 (from the past 48 hour(s))  CBC     Status: Abnormal   Collection Time   09/18/11 11:45 PM      Component Value Range Comment   WBC 10.0  4.0 - 10.5 (K/uL)    RBC 3.20 (*) 3.87 - 5.11 (MIL/uL)    Hemoglobin 7.5 (*) 12.0 - 15.0 (g/dL)    HCT 09.6 (*) 04.5 - 46.0 (%)    MCV 84.1  78.0 - 100.0 (fL)    MCH 23.4 (*) 26.0 - 34.0 (pg)    MCHC 27.9 (*) 30.0 - 36.0 (g/dL)    RDW 40.9  81.1 - 91.4 (%)    Platelets 134 (*) 150 - 400 (K/uL)   DIFFERENTIAL  Status: Abnormal   Collection Time   09/18/11 11:45 PM      Component Value Range Comment   Neutrophils Relative 86 (*) 43 - 77 (%)    Neutro Abs 8.6 (*) 1.7 - 7.7 (K/uL)    Lymphocytes Relative 9 (*) 12 - 46 (%)    Lymphs Abs 0.9  0.7 - 4.0 (K/uL)    Monocytes Relative 4  3 - 12 (%)    Monocytes Absolute 0.4  0.1 - 1.0 (K/uL)    Eosinophils Relative 1  0 - 5 (%)    Eosinophils Absolute 0.1  0.0 - 0.7 (K/uL)    Basophils Relative 0  0 - 1 (%)    Basophils Absolute 0.0  0.0 - 0.1 (K/uL)   COMPREHENSIVE METABOLIC PANEL     Status: Abnormal   Collection Time   09/18/11 11:45 PM      Component Value Range Comment   Sodium 135  135 - 145 (mEq/L)    Potassium 4.5  3.5 - 5.1 (mEq/L)    Chloride 92 (*) 96 - 112 (mEq/L)    CO2 39 (*) 19 - 32 (mEq/L)    Glucose, Bld 90  70 - 99 (mg/dL)    BUN 21  6 - 23 (mg/dL)    Creatinine, Ser 1.61 (*) 0.50 - 1.10 (mg/dL)    Calcium 9.3  8.4 - 10.5 (mg/dL)    Total Protein 6.7  6.0 - 8.3 (g/dL)    Albumin 2.8 (*) 3.5 - 5.2 (g/dL)    AST 20  0 - 37 (U/L)    ALT 17  0 - 35 (U/L)    Alkaline  Phosphatase 52  39 - 117 (U/L)    Total Bilirubin 0.2 (*) 0.3 - 1.2 (mg/dL)    GFR calc non Af Amer 46 (*) >90 (mL/min)    GFR calc Af Amer 53 (*) >90 (mL/min)   CK TOTAL AND CKMB     Status: Normal   Collection Time   09/18/11 11:45 PM      Component Value Range Comment   Total CK 32  7 - 177 (U/L)    CK, MB 2.7  0.3 - 4.0 (ng/mL)    Relative Index RELATIVE INDEX IS INVALID  0.0 - 2.5    TROPONIN I     Status: Normal   Collection Time   09/18/11 11:45 PM      Component Value Range Comment   Troponin I <0.30  <0.30 (ng/mL)   URINALYSIS, ROUTINE W REFLEX MICROSCOPIC     Status: Abnormal   Collection Time   09/19/11  1:26 AM      Component Value Range Comment   Color, Urine YELLOW  YELLOW     APPearance CLOUDY (*) CLEAR     Specific Gravity, Urine 1.009  1.005 - 1.030     pH 6.0  5.0 - 8.0     Glucose, UA NEGATIVE  NEGATIVE (mg/dL)    Hgb urine dipstick TRACE (*) NEGATIVE     Bilirubin Urine NEGATIVE  NEGATIVE     Ketones, ur NEGATIVE  NEGATIVE (mg/dL)    Protein, ur NEGATIVE  NEGATIVE (mg/dL)    Urobilinogen, UA 0.2  0.0 - 1.0 (mg/dL)    Nitrite NEGATIVE  NEGATIVE     Leukocytes, UA LARGE (*) NEGATIVE    URINE CULTURE     Status: Normal (Preliminary result)   Collection Time   09/19/11  1:26 AM      Component Value  Range Comment   Specimen Description URINE, CLEAN CATCH      Special Requests NONE      Setup Time [id number]      Colony Count >=100,000 COLONIES/ML      Culture GRAM NEGATIVE RODS      Report Status PENDING     URINE MICROSCOPIC-ADD ON     Status: Abnormal   Collection Time   09/19/11  1:26 AM      Component Value Range Comment   Squamous Epithelial / LPF RARE  RARE     WBC, UA 21-50  <3 (WBC/hpf)    RBC / HPF 3-6  <3 (RBC/hpf)    Bacteria, UA MANY (*) RARE    TYPE AND SCREEN     Status: Normal   Collection Time   09/19/11  3:07 AM      Component Value Range Comment   ABO/RH(D) A NEG      Antibody Screen NEG      Sample Expiration 09/22/2011     CBC      Status: Abnormal   Collection Time   09/19/11  6:20 AM      Component Value Range Comment   WBC 7.1  4.0 - 10.5 (K/uL)    RBC 2.92 (*) 3.87 - 5.11 (MIL/uL)    Hemoglobin 7.0 (*) 12.0 - 15.0 (g/dL)    HCT 81.1 (*) 91.4 - 46.0 (%)    MCV 85.3  78.0 - 100.0 (fL)    MCH 24.0 (*) 26.0 - 34.0 (pg)    MCHC 28.1 (*) 30.0 - 36.0 (g/dL)    RDW 78.2  95.6 - 21.3 (%)    Platelets 130 (*) 150 - 400 (K/uL)   COMPREHENSIVE METABOLIC PANEL     Status: Abnormal   Collection Time   09/19/11  6:20 AM      Component Value Range Comment   Sodium 139  135 - 145 (mEq/L)    Potassium 4.2  3.5 - 5.1 (mEq/L)    Chloride 95 (*) 96 - 112 (mEq/L)    CO2 40 (*) 19 - 32 (mEq/L)    Glucose, Bld 83  70 - 99 (mg/dL)    BUN 20  6 - 23 (mg/dL)    Creatinine, Ser 0.86 (*) 0.50 - 1.10 (mg/dL)    Calcium 9.0  8.4 - 10.5 (mg/dL)    Total Protein 6.2  6.0 - 8.3 (g/dL)    Albumin 2.6 (*) 3.5 - 5.2 (g/dL)    AST 21  0 - 37 (U/L)    ALT 17  0 - 35 (U/L)    Alkaline Phosphatase 53  39 - 117 (U/L)    Total Bilirubin 0.2 (*) 0.3 - 1.2 (mg/dL)    GFR calc non Af Amer 44 (*) >90 (mL/min)    GFR calc Af Amer 51 (*) >90 (mL/min)   GLUCOSE, CAPILLARY     Status: Normal   Collection Time   09/19/11  7:00 AM      Component Value Range Comment   Glucose-Capillary 79  70 - 99 (mg/dL)    Comment 1 Notify RN      Comment 2 Documented in Chart     GLUCOSE, CAPILLARY     Status: Abnormal   Collection Time   09/19/11 11:18 AM      Component Value Range Comment   Glucose-Capillary 121 (*) 70 - 99 (mg/dL)    Comment 1 Notify RN      Comment 2 Documented in Chart  GLUCOSE, CAPILLARY     Status: Abnormal   Collection Time   09/19/11  6:27 PM      Component Value Range Comment   Glucose-Capillary 176 (*) 70 - 99 (mg/dL)    Comment 1 Notify RN      Comment 2 Documented in Chart     URINALYSIS, ROUTINE W REFLEX MICROSCOPIC     Status: Abnormal   Collection Time   09/19/11  6:49 PM      Component Value Range Comment   Color,  Urine YELLOW  YELLOW     APPearance CLOUDY (*) CLEAR     Specific Gravity, Urine 1.010  1.005 - 1.030     pH 5.0  5.0 - 8.0     Glucose, UA NEGATIVE  NEGATIVE (mg/dL)    Hgb urine dipstick TRACE (*) NEGATIVE     Bilirubin Urine NEGATIVE  NEGATIVE     Ketones, ur NEGATIVE  NEGATIVE (mg/dL)    Protein, ur NEGATIVE  NEGATIVE (mg/dL)    Urobilinogen, UA 0.2  0.0 - 1.0 (mg/dL)    Nitrite NEGATIVE  NEGATIVE     Leukocytes, UA LARGE (*) NEGATIVE    URINE MICROSCOPIC-ADD ON     Status: Normal   Collection Time   09/19/11  6:49 PM      Component Value Range Comment   Squamous Epithelial / LPF RARE  RARE     WBC, UA 7-10  <3 (WBC/hpf)    RBC / HPF 0-2  <3 (RBC/hpf)    Bacteria, UA RARE  RARE     Urine-Other MUCOUS PRESENT     GLUCOSE, CAPILLARY     Status: Abnormal   Collection Time   09/19/11  9:24 PM      Component Value Range Comment   Glucose-Capillary 230 (*) 70 - 99 (mg/dL)   GLUCOSE, CAPILLARY     Status: Abnormal   Collection Time   09/20/11  7:15 AM      Component Value Range Comment   Glucose-Capillary 208 (*) 70 - 99 (mg/dL)    Comment 1 Notify RN     GLUCOSE, CAPILLARY     Status: Abnormal   Collection Time   09/20/11 11:06 AM      Component Value Range Comment   Glucose-Capillary 200 (*) 70 - 99 (mg/dL)    Comment 1 Documented in Chart      Comment 2 Notify RN     CBC     Status: Abnormal   Collection Time   09/20/11  2:05 PM      Component Value Range Comment   WBC 7.0  4.0 - 10.5 (K/uL)    RBC 3.20 (*) 3.87 - 5.11 (MIL/uL)    Hemoglobin 7.7 (*) 12.0 - 15.0 (g/dL)    HCT 16.1 (*) 09.6 - 46.0 (%)    MCV 83.4  78.0 - 100.0 (fL)    MCH 24.1 (*) 26.0 - 34.0 (pg)    MCHC 28.8 (*) 30.0 - 36.0 (g/dL)    RDW 04.5  40.9 - 81.1 (%)    Platelets 138 (*) 150 - 400 (K/uL)     Dg Chest 2 View  09/19/2011  *RADIOLOGY REPORT*  Clinical Data: Status post fall; weakness.  Concern for chest injury.  CHEST - 2 VIEW  Comparison: Chest radiograph performed 09/10/2011  Findings: The  lungs are well-aerated.  Worsening vascular congestion is noted, without significant edema.  Mildly worsened retrocardiac airspace opacification likely reflects atelectasis. There is no evidence of  pleural effusion or pneumothorax.  The cardiomediastinal silhouette is mildly enlarged.  No acute osseous abnormalities are seen.  There is chronic deformity of the proximal right humerus.  IMPRESSION:  1.  No displaced rib fractures seen. 2.  Worsening vascular congestion and mild cardiomegaly, without significant edema. 3.  Mildly worsened retrocardiac airspace opacification likely reflects atelectasis.  Original Report Authenticated By: Tonia Ghent, M.D.   Dg Foot Complete Left  09/19/2011  *RADIOLOGY REPORT*  Clinical Data: Status post fall; ecchymosis about the left foot.  LEFT FOOT - COMPLETE 3+ VIEW  Comparison: Left foot radiographs performed 02/06/2006, and left great toe radiographs performed 07/28/2010  Findings: There is a mildly comminuted fracture through the first proximal phalanx, with likely extension to the first metacarpophalangeal joint.  There is also a minimally displaced fracture through the proximal aspect of the fourth proximal phalanx, without definite evidence of intra-articular extension.  A minimally displaced fracture through the lateral aspect of the base of the second proximal phalanx is also suspected, with intra- articular extension.  No definite additional fractures are seen, though evaluation is suboptimal due to the degree of osteopenia.  Visualized joint spaces are otherwise grossly preserved.  The subtalar joint is grossly unremarkable in appearance.  Prominent plantar and posterior calcaneal spurs are identified; there is calcification about the expected course of the Achilles tendon, and within the plantar fat pad.  Diffuse vascular calcifications are seen.  Apparent os naviculare and os peroneum are seen.  Diffuse soft tissue swelling is noted about the foot.  IMPRESSION:  1.   Mildly comminuted fracture at the first proximal phalanx, with likely intra-articular extension noted proximally. 2.  Minimally displaced fractures through the proximal second and fourth proximal phalanges, with intra-articular extension at the second phalanx. 3.  Evaluation suboptimal to extensive osteopenia. 4.  Diffuse vascular calcifications seen. 5.  Os naviculare and os peroneum seen.  Original Report Authenticated By: Tonia Ghent, M.D.    Review of Systems  Constitutional: Negative.   HENT: Negative.   Musculoskeletal: Positive for joint pain.  Skin: Negative.   All other systems reviewed and are negative.   Blood pressure 150/66, pulse 59, temperature 98.2 F (36.8 C), temperature source Oral, resp. rate 18, height 5\' 5"  (1.651 m), weight 130.8 kg (288 lb 5.8 oz), SpO2 100.00%. Physical Exam on physical examination Ms. Oloughlin is morbidly obese. She has well healed surgical incision on the right foot and ankle from prior fall in 2006. On the left foot she has some pitting edema palpable and intact anterior tib posterior tib peroneal and Achilles tendons. She has symmetrically diminished sensation on the dorsal and plantar aspect of both feet. Pedal pulses are palpable. She has a small laceration on the plantar aspect of the second toe. This is a superficial laceration. It measures 8 mm x 1 mm. The great toe has some swelling but no lacerations.  Assessment/Plan: Ms. Drue Dun has a laceration on the plantar aspect of the fourth toe she has a minimally comminuted and nondisplaced fracture at the base of the proximal phalanx which is away from this laceration. This is not representative of fracture. Plan is for local wound care and cashew. I have asked the nurse to clean the incision with half and half solution of peroxide and saline apply Bactroban cream and Band-Aid and progress with ambulation as tolerated in a hard sole shoe. Daily wound care will need to be a performed at the skilled  nursing facility because of the patient's diabetes and  high likelihood of potentially having an infection. Because this wound is open out for her not to treat with prophylactic antibiotics until such time if infection ensues. I can follow up with her in 10 days to look at this laceration in nature it is improving.  Alexander Aument SCOTT 09/20/2011, 3:11 PM

## 2011-09-20 NOTE — Progress Notes (Signed)
PGY-1 Daily Progress Note Family Medicine Teaching Service D. Piloto Rolene Arbour, MD Service Pager: [phone]  Patient name: Holly Kim  Medical record [id number] Date of birth:02/25/1941 Age: 70 y.o. Gender: female  LOS: 2 days   Subjective: No events overnight. Feels as her baseline.  Objective:  Temp:  98.2 F  (12/07 0426) Pulse  74  (12/07 0426) Resp: 20  (12/07 0426) BP: 175/78 mmHg (12/07 0426) SpO2: 96 % (12/07 0426) Weight: 288 lb 5.8 oz(12/07 0426)  Physical Exam: General: alert, cooperative, no distress, morbidly obese and slowed mentation  HEENT: extra ocular movement intact, sclera clear, anicteric and oropharynx clear, no lesions  Heart: RRR. No murmurs, rubs or gallops.  Lungs: clear to auscultation, no wheezes or rales.  Abdomen: abdomen is soft without significant tenderness or guarding.  Extremities: edema bilateral and there are lacerations under 1st and 4th toes of left LE. Decreased pedal pulses bilaterally.  Neurology: Oriented X 3. No focal motor deficits. No resting tremors, but present intentional when asked. Gait not explored  Labs and imaging: CBG's: 200, 208, 230 ,176   Medications: Current Facility-Administered Medications  Medication Dose Route Frequency Provider Last Rate Last Dose  . 0.9 %  sodium chloride infusion  250 mL Intravenous PRN Clementeen Graham      . acetaminophen (TYLENOL) tablet 650 mg  650 mg Oral Q6H PRN Evan Corey   650 mg at 09/20/11 1111   Or  . acetaminophen (TYLENOL) suppository 650 mg  650 mg Rectal Q6H PRN Clementeen Graham      . albuterol (PROVENTIL) (5 MG/ML) 0.5% nebulizer solution 2.5 mg  2.5 mg Nebulization Q4H PRN Clementeen Graham      . antiseptic oral rinse (BIOTENE) solution 15 mL  15 mL Mouth Rinse BID Tobin Chad   15 mL at 09/20/11 0752  . aspirin EC tablet 81 mg  81 mg Oral Daily Evan Corey   81 mg at 09/20/11 1105  . bisoprolol (ZEBETA) tablet 5 mg  5 mg Oral Daily Evan Corey   5 mg at 09/20/11 1106  .  clopidogrel (PLAVIX) tablet 75 mg  75 mg Oral Daily Evan Corey   75 mg at 09/20/11 1106  . docusate sodium (COLACE) capsule 100 mg  100 mg Oral BID Evan Corey   100 mg at 09/20/11 1105  . famotidine (PEPCID) tablet 20 mg  20 mg Oral Daily Evan Corey   20 mg at 09/20/11 1106  . ferrous sulfate tablet 325 mg  325 mg Oral Daily Evan Corey   325 mg at 09/20/11 1105  . heparin injection 5,000 Units  5,000 Units Subcutaneous Q8H Evan Corey   5,000 Units at 09/20/11 5621  . insulin aspart (novoLOG) injection 0-15 Units  0-15 Units Subcutaneous TID WC Evan Corey   5 Units at 09/20/11 0752  . insulin aspart (novoLOG) injection 0-5 Units  0-5 Units Subcutaneous QHS Evan Corey   2 Units at 09/19/11 2248  . insulin aspart (novoLOG) injection 3 Units  3 Units Subcutaneous TID WC Evan Corey   3 Units at 09/20/11 0752  . insulin glargine (LANTUS) injection 30 Units  30 Units Subcutaneous QHS Josalyn Funches   30 Units at 09/19/11 2248  . lisinopril (PRINIVIL,ZESTRIL) tablet 5 mg  5 mg Oral Daily Evan Corey   5 mg at 09/20/11 1107  . metoCLOPramide (REGLAN) tablet 5 mg  5 mg Oral TID AC & HS Dayarmys Piloto, MD   5 mg at 09/20/11 1111  .  morphine (MS CONTIN) 12 hr tablet 15 mg  15 mg Oral Q8H Evan Corey   15 mg at 09/20/11 1610  . nystatin (NYSTOP) topical powder 100,000 Units  100,000 Units Topical BID Evan Corey   100,000 Units at 09/20/11 1107  . ondansetron (ZOFRAN) tablet 4 mg  4 mg Oral Q6H PRN Clementeen Graham      . pantoprazole (PROTONIX) EC tablet 40 mg  40 mg Oral Q1200 Evan Corey   40 mg at 09/19/11 1251  . rosuvastatin (CRESTOR) tablet 40 mg  40 mg Oral Daily Evan Corey   40 mg at 09/20/11 1105  . senna (SENOKOT) tablet 25.8 mg  3 tablet Oral QHS PRN Evan Corey   25.8 mg at 09/20/11 0502  . sodium chloride 0.9 % injection 3 mL  3 mL Intravenous Q12H Evan Corey   3 mL at 09/20/11 1108  . sodium chloride 0.9 % injection 3 mL  3 mL Intravenous PRN Clementeen Graham      . sucralfate (CARAFATE) tablet 1 g  1 g Oral  TID AC Evan Corey   1 g at 09/20/11 1111  . torsemide (DEMADEX) tablet 20 mg  20 mg Oral Daily Evan Corey   20 mg at 09/20/11 1107  . DISCONTD: insulin glargine (LANTUS) injection 55 Units  55 Units Subcutaneous Daily Clementeen Graham        Assessment and Plan: Holly Kim is a 70 y.o. year old female presenting with generalized weakness and trauma of left foot.  1. Generalized weakness: Most likely deconditioning. Has improved per pt statement. - up with assistance.  2. Trauma of left foot: fracture at the first proximal phalanx and displaced fractures through the proximal second and fourth proximal phalanges, with intra-articular extension at the second phalanx of the left foot during fall with left foot laceration. Non bleeding at the time of evaluation.  - No sutures. Pain is controlled with her home medications we are continuing during hospitalization. Consult with Dr Dorene Grebe today or as outpatient as per his recommendations. If going home we will order Post op shoe.  3. UTI: Positive UA on clean catch. Cath sample ordered for UA an micro and culture yesterday. Pt asymptomatic. Micro of the cath sample showed rare bacteria 7-10 wbc. We will wait for culture to treat.  4. Anemia:  Hb 7.0 yesterday at 6:00 am . Prior 5 hours earlier on 7.5. No SOB or other acute symptoms related to this condition.. - Repeat CBC.  5. DM - Monitor CBG's and  SSI.   6. Hx of recent angioplasty: No chest pain, no palpitations.  - Telemetry bed and monitor any chest pain.   7. FEN/GI: CHO modified diet.  8. Prophylaxis: Heparin. Pantoprazole 9. Disposition: Possible discharge home today since pt does not want to go to SNF or Rehab.   D. Piloto Rolene Arbour, MD PGY1, Family Medicine Teaching Service Pager [phone] 09/20/2011 [phone] 09/20/2011

## 2011-09-20 NOTE — Progress Notes (Signed)
PT Note PT session cancelled today - post-op shoe not available (did get verbal order from MD).  Will return in am for PT evaluation. Durenda Hurt Renaldo Fiddler, [facility] Acute Rehab Services Pager [phone]

## 2011-09-21 LAB — URINE CULTURE
Colony Count: >=100000
Culture  Setup Time: 201212061958
Special Requests: 1

## 2011-09-21 LAB — GLUCOSE, CAPILLARY

## 2011-09-21 MED ORDER — CIPROFLOXACIN HCL 500 MG PO TABS
500.0000 mg | ORAL_TABLET | Freq: Two times a day (BID) | ORAL | Status: DC
  Administered 2011-09-21 – 2011-09-24 (×7): 500 mg via ORAL
  Filled 2011-09-21 (×9): qty 1

## 2011-09-21 MED ORDER — INSULIN GLARGINE 100 UNIT/ML ~~LOC~~ SOLN
40.0000 [IU] | Freq: Every day | SUBCUTANEOUS | Status: DC
  Administered 2011-09-21: 40 [IU] via SUBCUTANEOUS

## 2011-09-21 MED ORDER — CIPROFLOXACIN HCL 250 MG PO TABS
250.0000 mg | ORAL_TABLET | Freq: Two times a day (BID) | ORAL | Status: DC
  Filled 2011-09-21 (×3): qty 1

## 2011-09-21 NOTE — Progress Notes (Signed)
PGY-1 Daily Progress Note Family Medicine Teaching Service D. Piloto Rolene Arbour, MD Service Pager: [phone]  Patient name: Holly Kim  Medical record [id number] Date of birth:07-19-1941 Age: 70 y.o. Gender: female  LOS: 3 days   Subjective: No events overnight.   Objective:  Vitals Temp:   98.6 F(12/08 0625) Pulse Rate: 55  (12/08 0625) Resp:  19  (12/08 0625) BP: 153/57 mmHg (12/08 0625) SpO2: 96 % (12/08 0625) Physical Exam: General: alert, cooperative, no distress, morbidly obese  HEENT: Moist mucosa. Heart: RRR. No murmurs, rubs or gallops.  Lungs: clear to auscultation, no wheezes or rales.  Abdomen: abdomen is soft without significant tenderness or guarding.  Extremities: edema bilateral and there are lacerations under 1st and 4th toes of left LE. Decreased pedal pulses bilaterally.  Neurology: Oriented X 3. No focal motor deficits. No resting tremors. Gait not explored  Labs and imaging:  CBC  Lab 09/20/11 1405 09/19/11 0620 09/18/11 2345  WBC 7.0 7.1 10.0  HGB 7.7* 7.0* 7.5*  HCT 26.7* 24.9* 26.9*  PLT 138* 130* 134*    Results for orders placed during the hospital encounter of 09/18/11 (from the past 24 hour(s))  GLUCOSE, CAPILLARY     Status: Abnormal   Collection Time   09/20/11  4:04 PM      Component Value Range   Glucose-Capillary 210 (*) 70 - 99 (mg/dL)  GLUCOSE, CAPILLARY     Status: Abnormal   Collection Time   09/20/11  9:47 PM      Component Value Range   Glucose-Capillary 228 (*) 70 - 99 (mg/dL)  GLUCOSE, CAPILLARY     Status: Abnormal   Collection Time   09/21/11  6:13 AM      Component Value Range   Glucose-Capillary 220 (*) 70 - 99 (mg/dL)    Medications: Current Facility-Administered Medications  Medication Dose Route Frequency Provider Last Rate Last Dose                       Or  . acetaminophen (TYLENOL) suppository 650 mg  650 mg Rectal Q6H PRN Clementeen Graham      . albuterol (PROVENTIL) (5 MG/ML) 0.5% nebulizer  solution 2.5 mg  2.5 mg Nebulization Q4H PRN Clementeen Graham      . antiseptic oral rinse (BIOTENE) solution 15 mL  15 mL Mouth Rinse BID Tobin Chad   15 mL at 09/20/11 2129  . aspirin EC tablet 81 mg  81 mg Oral Daily Evan Corey   81 mg at 09/20/11 1105  . bisoprolol (ZEBETA) tablet 5 mg  5 mg Oral Daily Evan Corey   5 mg at 09/20/11 1106  . ciprofloxacin (CIPRO) tablet 500 mg  500mg  Oral BID Dayarmys Piloto, MD      . clopidogrel (PLAVIX) tablet 75 mg  75 mg Oral Daily Evan Corey   75 mg at 09/20/11 1106  . docusate sodium (COLACE) capsule 100 mg  100 mg Oral BID Evan Corey   100 mg at 09/20/11 2129  . famotidine (PEPCID) tablet 20 mg  20 mg Oral Daily Evan Corey   20 mg at 09/20/11 1106  . ferrous sulfate tablet 325 mg  325 mg Oral Daily Evan Corey   325 mg at 09/20/11 1105  . heparin injection 5,000 Units  5,000 Units Subcutaneous Q8H Evan Corey   5,000 Units at 09/21/11 0700  . insulin aspart (novoLOG) injection 0-15 Units  0-15 Units Subcutaneous TID WC Evan  Corey   5 Units at 09/20/11 1737  . insulin aspart (novoLOG) injection 0-5 Units  0-5 Units Subcutaneous QHS Clementeen Graham   2 Units at 09/20/11 2149  . insulin aspart (novoLOG) injection 3 Units  3 Units Subcutaneous TID WC Evan Corey   3 Units at 09/20/11 1738  . insulin glargine (LANTUS) injection 30 Units  30 Units Subcutaneous QHS Josalyn Funches   30 Units at 09/20/11 2147  . lisinopril (PRINIVIL,ZESTRIL) tablet 5 mg  5 mg Oral Daily Evan Corey   5 mg at 09/20/11 1107  . metoCLOPramide (REGLAN) tablet 5 mg  5 mg Oral TID AC & HS Dayarmys Piloto, MD   5 mg at 09/21/11 0643  . morphine (MS CONTIN) 12 hr tablet 15 mg  15 mg Oral Q8H Evan Corey   15 mg at 09/21/11 7829  . nystatin (NYSTOP) topical powder 100,000 Units  100,000 Units Topical BID Evan Corey   100,000 Units at 09/20/11 2131  . ondansetron (ZOFRAN) tablet 4 mg  4 mg Oral Q6H PRN Clementeen Graham      . pantoprazole (PROTONIX) EC tablet 40 mg  40 mg Oral Q1200 Evan Corey   40 mg  at 09/20/11 1239  . rosuvastatin (CRESTOR) tablet 40 mg  40 mg Oral Daily Evan Corey   40 mg at 09/20/11 1105  . senna (SENOKOT) tablet 25.8 mg  3 tablet Oral QHS PRN Evan Corey   25.8 mg at 09/20/11 0502  . sodium chloride 0.9 % injection 3 mL  3 mL Intravenous Q12H Evan Corey   3 mL at 09/20/11 2131  . sodium chloride 0.9 % injection 3 mL  3 mL Intravenous PRN Clementeen Graham      . sucralfate (CARAFATE) tablet 1 g  1 g Oral TID AC Evan Corey   1 g at 09/21/11 5621  . torsemide (DEMADEX) tablet 20 mg  20 mg Oral Daily Evan Corey   20 mg at 09/20/11 1107    Assessment and Plan: Holly Kim is a 70 y.o. year old female presenting with generalized weakness and trauma of left foot.  1. Generalized weakness: Most likely deconditioning. Has improved per pt statement.  - up with assistance.   2. Trauma of left foot: Ortho consulted her yesterday and recommended local wound care daily  and progress with ambulation as tolerated in a hard sole shoe with follow up  in 10 days.  3. UTI: Culture back grew >100 000 col of Klebsiella P. Sensitive to cipro with MIC of 0.25.  - Started Pt on Ciprofloxacin 500 for 7 days since pt has had this year two more episodes of UTI growing enterococcus/ grup B strep and klebsiella and is DM. She denied urinary symptoms but now mentions she has had some frequency in the near past.  4. Anemia: Hb 7.0 yesterday at 6:00 am . CBC stat showed 7.7 yesterday. No SOB or other acute symptoms related to this condition..  - monitor symptoms.  5. DM. Mid 200's. - Monitor CBG's and SSI. Increase Lantus to 40 units. (Pt home lantus is 55units)  6. Hx of recent angioplasty: No chest pain, no palpitations. No alarms on monitor. - Telemetry bed and monitor any chest pain.   8. Chronic airway obstruction. Restrictive Lung disease (morbid obesity): CO2 40 Pt baseline.  - O2 nasal canula. No desaturation during hospitalization.   7. FEN/GI: CHO modified diet.  8. Prophylaxis:  Heparin. Pantoprazole       9. Disposition: Pending  placement in a SNF. Now pt agrees to go to a [facility].  D. Piloto Rolene Arbour, MD PGY1, [facility] Medicine Teaching Service Pager [phone] 09/21/2011 [phone]

## 2011-09-21 NOTE — Progress Notes (Signed)
Physical Therapy Evaluation Patient Details Name: Holly Kim MRN: [id number] DOB: June 03, 1941 Today's Date: 09/21/2011  Problem List:  Patient Active Problem List  Diagnoses  . DIABETES MELLITUS, II, COMPLICATIONS  . HYPERCHOLESTEROLEMIA  . Morbid obesity  . ANEMIA, NORMOCYTIC  . ANXIETY  . BORDERLINE PERSONALITY  . DEPRESSIVE DISORDER, NOS  . TARDIVE DYSKINESIA  . NEUROPATHY, PERIPHERAL  . CAD  . DEEP VEIN THROMBOPHLEBITIS, LEG  . VENOUS INSUFFICIENCY  . CHRONIC AIRWAY OBSTRUCTION NEC  . GERD  . CONSTIPATION  . BACK PAIN, CHRONIC  . CEREBROVAS DIS, LATE EFFECTS (S/P CVA)  . FATIGUE  . CHEST PAIN, ATYPICAL  . Abdominal pain, right upper quadrant  . ADULT MALTREATMENT UNSPECIFIED NEC  . Encounter for long-term (current) use of high-risk medication  . Abdominal pain  . Blindness - both eyes  . Impaired mobility  . Diastolic dysfunction  . Encounter for palliative care  . Dyspnea  . Restrictive lung disease  . Housing or economic circumstance  . Skin tag of female perineum  . Unstable angina    Past Medical History:  Past Medical History  Diagnosis Date  . PE (pulmonary embolism)   . DVT (deep venous thrombosis)   . Diabetes mellitus   . Asthma   . Hypertension   . CAD (coronary artery disease)     s/p pci rca in past; nstemi 12/2009 with rca occlusion/isr - 2.75 x 18-mm PROMUS DES placed.  residual ostial lcx dzs  . GERD (gastroesophageal reflux disease)   . COPD (chronic obstructive pulmonary disease)   . Obesity   . Tardive dyskinesia   . History of renal failure   . Depression   . Borderline personality disorder   . Anxiety   . Anemia     hx of blood transfusion x 3   Past Surgical History:  Past Surgical History  Procedure Date  . US echocardiography 2008  . Orif ankle fracture   . Appendectomy   . Cervical discectomy   . Ptca 06/2010  . Tonsillectomy   . Transthoracic echocardiogram 06/2010    Grade 1 diastolic, EF 55-60    PT  Assessment/Plan/Recommendation PT Assessment Clinical Impression Statement: Patient is a 70 yo female admitted following fall with fracture left foot.  Patient with decreased strength and mobility, impacting safety with transfers.  Recommend SNF for continued therapy at discharge PT Recommendation/Assessment: Patient will need skilled PT in the acute care venue PT Problem List: Decreased strength;Decreased activity tolerance;Decreased balance;Decreased mobility;Decreased knowledge of use of DME;Impaired sensation;Pain Barriers to Discharge: Decreased caregiver support PT Therapy Diagnosis : Generalized weakness (Difficulty with transfers) PT Plan PT Frequency: Min 3X/week PT Treatment/Interventions: DME instruction;Functional mobility training;Therapeutic activities;Therapeutic exercise;Balance training;Patient/family education PT Recommendation Follow Up Recommendations: Skilled nursing facility Equipment Recommended: Defer to next venue PT Goals  Acute Rehab PT Goals PT Goal Formulation: With patient Time For Goal Achievement: 2 weeks Pt will go Supine/Side to Sit: with supervision;with HOB not 0 degrees (comment degree);with rail PT Goal: Supine/Side to Sit - Progress: Not met Pt will go Sit to Supine/Side: with supervision;with HOB not 0 degrees (comment degree);with rail PT Goal: Sit to Supine/Side - Progress: Not met Pt will go Sit to Stand: with supervision PT Goal: Sit to Stand - Progress: Not met Pt will Transfer Bed to Chair/Chair to Bed: with supervision PT Transfer Goal: Bed to Chair/Chair to Bed - Progress: Not met  PT Evaluation Precautions/Restrictions  Precautions Precautions: Fall Restrictions Weight Bearing Restrictions: Yes LLE Weight Bearing: Weight  bearing as tolerated Other Position/Activity Restrictions: Wear post-op boot when transferring/weightbearing on left foot Prior Functioning  Home Living Lives With: Sheran Spine Help From: Family;Personal care  attendant Type of Home: House Home Layout: One level Home Access: Ramped entrance Bathroom Shower/Tub: Walk-in shower (Uses tub bench in shower) Bathroom Toilet: Handicapped height Bathroom Accessibility: Yes How Accessible: Accessible via wheelchair Home Adaptive Equipment: Tub transfer bench;Hospital bed;Hand-held shower hose;Grab bars in shower;Walker - rolling;Straight cane;Wheelchair - powered;Wheelchair - manual Prior Function Level of Independence: Independent with transfers;Needs assistance with ADLs;Needs assistance with homemaking Driving: No Cognition Cognition Arousal/Alertness: Awake/alert Overall Cognitive Status: Appears within functional limits for tasks assessed Orientation Level: Oriented X4 Sensation/Coordination Sensation Light Touch: Impaired by gross assessment (Neuropathy in bil. LE's) Extremity Assessment RUE Assessment RUE Assessment: Exceptions to [facility] RUE AROM (degrees) Overall AROM Right Upper Extremity: Deficits;Due to premorbid status (Decreased shoulder flexion - prior injury) RUE Strength RUE Overall Strength: Within Functional Limits for tasks performed (Grossly 4/5) LUE Assessment LUE Assessment: Within Functional Limits (Grossly 4/5) RLE Assessment RLE Assessment: Exceptions to [facility] (Grossly 4-/5 - general weakness) LLE Assessment LLE Assessment: Exceptions to [facility] (Grossly 4-/5 - General weakness) Mobility (including Balance) Bed Mobility Supine to Sit: 4: Min assist;HOB elevated (Comment degrees);With rails (HOB at 45 degrees) Supine to Sit Details (indicate cue type and reason): Cues for technique Sitting - Scoot to Edge of Bed: 4: Min assist Sitting - Scoot to Edge of Bed Details (indicate cue type and reason): Verbal cues to scoot one hip forward at a time Transfers Transfers: Yes Sit to Stand: 4: Min assist;With upper extremity assist;With armrests;From bed;From chair/3-in-1 Sit to Stand Details (indicate cue type and reason): verbal cues  for hand placement and safety Stand to Sit: 4: Min assist;With upper extremity assist;With armrests;To chair/3-in-1 Stand to Sit Details: Verbal cues for technique and hand placement Stand Pivot Transfers: 4: Min assist;With armrests;From elevated surface Stand Pivot Transfer Details (indicate cue type and reason): Cues for technique.  Transfers to [employer] and to recliner Ambulation/Gait Ambulation/Gait: No  Balance Balance Assessed: Yes Dynamic Sitting Balance Dynamic Sitting - Balance Support: Left upper extremity supported Dynamic Sitting - Level of Assistance: 5: Stand by assistance Dynamic Sitting Balance - Compensations: Uses unilateral support when reaching outside base of support Static Standing Balance Static Standing - Balance Support: Bilateral upper extremity supported Static Standing - Comment/# of Minutes: Minimal amount of time standing due to weakness/fatigue.  Requires UE support for balance Exercise    End of Session PT - End of Session Equipment Utilized During Treatment:  (Post-op shoe on LLE) Activity Tolerance: Patient limited by fatigue;Patient limited by pain Patient left: in chair;with call bell in reach Nurse Communication: Mobility status for transfers General Behavior During Session: [facility] for tasks performed Cognition: [facility] for tasks performed  Vena Austria [phone] 09/21/2011, 3:21 PM

## 2011-09-21 NOTE — Progress Notes (Signed)
Family Medicine Teaching Service Attending Note  I interviewed and examined patient Holly Kim and reviewed their tests and x-rays.  I discussed with Dr. Aviva Signs and reviewed their note for today.  I agree with their assessment and plan.     Additionally  DC Cardiac monitor DC foley Elevate left leg

## 2011-09-22 ENCOUNTER — Other Ambulatory Visit: Payer: Self-pay

## 2011-09-22 LAB — GLUCOSE, CAPILLARY
Glucose-Capillary: 154 mg/dL — ABNORMAL HIGH (ref 70–99)
Glucose-Capillary: 50 mg/dL — ABNORMAL LOW (ref 70–99)
Glucose-Capillary: 85 mg/dL (ref 70–99)
Glucose-Capillary: 121 mg/dL — ABNORMAL HIGH (ref 70–99)

## 2011-09-22 MED ORDER — INSULIN GLARGINE 100 UNIT/ML ~~LOC~~ SOLN
30.0000 [IU] | Freq: Every day | SUBCUTANEOUS | Status: DC
  Administered 2011-09-22 – 2011-09-23 (×2): 30 [IU] via SUBCUTANEOUS

## 2011-09-22 MED ORDER — INSULIN ASPART 100 UNIT/ML ~~LOC~~ SOLN
0.0000 [IU] | Freq: Three times a day (TID) | SUBCUTANEOUS | Status: DC
  Administered 2011-09-22 (×2): 3 [IU] via SUBCUTANEOUS
  Administered 2011-09-23: 4 [IU] via SUBCUTANEOUS
  Administered 2011-09-23: 7 [IU] via SUBCUTANEOUS
  Administered 2011-09-23: 3 [IU] via SUBCUTANEOUS
  Administered 2011-09-24: 7 [IU] via SUBCUTANEOUS
  Administered 2011-09-24: 4 [IU] via SUBCUTANEOUS

## 2011-09-22 MED ORDER — INSULIN ASPART 100 UNIT/ML ~~LOC~~ SOLN
6.0000 [IU] | Freq: Three times a day (TID) | SUBCUTANEOUS | Status: DC
  Administered 2011-09-22 – 2011-09-24 (×7): 6 [IU] via SUBCUTANEOUS

## 2011-09-22 MED ORDER — INSULIN ASPART 100 UNIT/ML ~~LOC~~ SOLN
0.0000 [IU] | Freq: Every day | SUBCUTANEOUS | Status: DC
  Administered 2011-09-23: 2 [IU] via SUBCUTANEOUS

## 2011-09-22 MED ORDER — NITROGLYCERIN 0.4 MG SL SUBL
SUBLINGUAL_TABLET | SUBLINGUAL | Status: AC
  Filled 2011-09-22: qty 25

## 2011-09-22 NOTE — Progress Notes (Signed)
Family Medicine Teaching Service Attending Note  I interviewed and examined patient Holly Kim and reviewed their tests and x-rays.  I discussed with Dr. Lula Olszewski and reviewed their note for today.  I agree with their assessment and plan.     Additionally  Asked nurse to clean her foot wounds Medically stable

## 2011-09-22 NOTE — Progress Notes (Signed)
CSW initiated SNF search in [place]. Will f/u with offers as available. FL2 on chart for MD signature.  Dellie Burns, MSW, [place] [phone]

## 2011-09-22 NOTE — Progress Notes (Signed)
Family Medicine Teaching Service Daily Progress Note  Subjective: Patient complaining of gall bladder pain and of bilateral lower extremity and feet pain.  She says she is only getting tylenol for the pain which does not help.  She is not feeling very hungry but is tolerating some fluids by mouth.  Objective: Vital signs in last 24 hours: Filed Vitals:   09/21/11 0625 09/21/11 1400 09/21/11 2149 09/22/11 0541  BP: 153/57 145/77 147/53 114/68  Pulse: 55 52 52 52  Temp: 98.6 F (37 C) 98.8 F (37.1 C) 98.5 F (36.9 C) 98.2 F (36.8 C)  TempSrc: Oral Oral Oral Oral  Resp: 19 22 20 18   Height:      Weight: 286 lb 9.6 oz (130 kg)   289 lb 0.4 oz (131.1 kg)  SpO2: 96% 100% 99% 100%   Weight change: 2 lb 6.8 oz (1.1 kg)  Intake/Output Summary (Last 24 hours) at 09/22/11 0857 Last data filed at 09/22/11 1610  Gross per 24 hour  Intake    960 ml  Output   1000 ml  Net    -40 ml   General appearance: alert and cooperative Eyes: PERRL, EOMIT Throat: Oral mucosa moist, no lesions Neck: no adenopathy, no JVD, supple, symmetrical, trachea midline and thyroid not enlarged, symmetric, no tenderness/mass/nodules Lungs: clear to auscultation bilaterally Heart: regular rate and rhythm, S1, S2 normal, no murmur, click, rub or gallop Abdomen: normal findings: bowel sounds normal and obese and soft and abnormal findings:  diffuse tenderness, no rebound Extremities: edema of right lower extremity, 1+, pain with palpation of either extremity.  Pulses: decreased pulses in lower extremities bilaterally.  Skin: wounds with scabs on left shin, multiple on left foot and toes, no erythema or drainage or warmth. Lab Results:  [facility] 09/20/11 1405  WBC 7.0  HGB 7.7*  HCT 26.7*  PLT 138*    Micro Results: Recent Results (from the past 240 hour(s))  URINE CULTURE     Status: Normal   Collection Time   09/19/11  1:26 AM      Component Value Range Status Comment   Specimen Description URINE,  CLEAN CATCH   Final    Special Requests NONE   Final    Setup Time [id number]   Final    Colony Count >=100,000 COLONIES/ML   Final    Culture KLEBSIELLA PNEUMONIAE   Final    Report Status 09/20/2011 FINAL   Final    Organism ID, Bacteria KLEBSIELLA PNEUMONIAE   Final   URINE CULTURE     Status: Normal   Collection Time   09/19/11  6:49 PM      Component Value Range Status Comment   Specimen Description URINE, CATHETERIZED   Final    Special Requests 1 dose yesterday of ancef Normal   Final    Setup Time [id number]   Final    Colony Count >=100,000 COLONIES/ML   Final    Culture KLEBSIELLA PNEUMONIAE   Final    Report Status 09/21/2011 FINAL   Final    Organism ID, Bacteria KLEBSIELLA PNEUMONIAE   Final    Studies/Results: No new imaging.   Medications: I have reviewed the patient's current medications. Scheduled Meds:   . antiseptic oral rinse  15 mL Mouth Rinse BID  . aspirin EC  81 mg Oral Daily  . bisoprolol  5 mg Oral Daily  . ciprofloxacin  500 mg Oral BID  . clopidogrel  75 mg Oral Daily  . docusate sodium  100 mg Oral BID  . famotidine  20 mg Oral Daily  . ferrous sulfate  325 mg Oral Daily  . heparin  5,000 Units Subcutaneous Q8H  . insulin aspart  0-15 Units Subcutaneous TID WC  . insulin aspart  0-5 Units Subcutaneous QHS  . insulin aspart  3 Units Subcutaneous TID WC  . insulin glargine  40 Units Subcutaneous QHS  . lisinopril  5 mg Oral Daily  . metoCLOPramide  5 mg Oral TID AC & HS  . morphine  15 mg Oral Q8H  . nystatin  100,000 Units Topical BID  . pantoprazole  40 mg Oral Q1200  . rosuvastatin  40 mg Oral Daily  . sodium chloride  3 mL Intravenous Q12H  . sucralfate  1 g Oral TID AC  . torsemide  20 mg Oral Daily  . DISCONTD: ciprofloxacin  250 mg Oral BID  . DISCONTD: insulin glargine  30 Units Subcutaneous QHS   Continuous Infusions:  PRN Meds:.sodium chloride, acetaminophen, acetaminophen, albuterol, ondansetron, senna, sodium  chloride Assessment/Plan: Holly Kim is a 70 y.o. female patient, Hospital day 4 for trauma of left foot, generalized weakness, now awaiting placement.  1) Ortho: Fracture of left foot: Pt to wear Post op shoe per Ortho.  Daily wound care for laceration, Orthopedics does not feel the laceration is related to the fracture, ie, it is a closed fracture.  Physical therapy is following patient, will continue rehab at [facility]. Patient is on her home dose of MS contin, considering her obesity and restrictive lung disease, I am concerned that oversedation could cause respiratory compromise in this patient, will continue with MS contin 15mg  PO TID and Tylenol as needed.  2) Weakness- no organic abnormality as cause, will continue with physical therapy for deconditioning.  3) GU: UTI- pt with UTI Klebsiella sensitive to Cipro, will continue Ciprofloxacin for 10 day course.  4) Heme: Anemia- not symptomatic, will obtain labs tomorrow morning to continue monitoring. Continue PO Iron.  5) Endocrine: Diabetes- pt with low blood sugars after increase in lantus.  Also, she is not eating well.  Will decrease lantus back to 30 units, but increase the sliding scale from moderate to resistant in hopes of good control without low blood sugars.  6) Cardiovascular: Recent MI and angioplasty-  Pt currently asymptomatic, no chest pain, will continue telemetry.  Transfusion threshold should be slightly higher (8) due to CAD. Continue Plavix.  Diastolic Heart failure-  Pt with some lower extremity edema, she says this is about normal for her.  Will continue Betablocker and torsemide at current dose, monitor for signs of fluid overload.  7) Pulmonary: Restrictive lung disease-No wheezing on exam, no desaturations, or complaints of dyspnea.  Continue monitoring.  8) FEN/GI- continue carb modified diet, encourage PO intake, continue saline lock IV.  9) Prophylaxis- SQ Heparin and PO Protonix.  10) Disposition- Pending  placement.    LOS: 4 days   Holly Kim 09/22/2011, 8:57 AM

## 2011-09-22 NOTE — Progress Notes (Signed)
CBG: 50   Treatment: 15 GM carbohydrate snack  Symptoms: None  Follow-up CBG: Time:0640 CBG Result:66  Possible Reasons for Event: Unknown  Comments/MD notified:    Bartholomew Boards

## 2011-09-22 NOTE — Progress Notes (Signed)
CBG: 66  Treatment: 15 GM carbohydrate snack  Symptoms: None  Follow-up CBG: Time:0705 CBG Result:85  Possible Reasons for Event: Unknown  Comments/MD notified:    Bartholomew Boards

## 2011-09-23 LAB — CBC
Hemoglobin: 8.1 g/dL — ABNORMAL LOW (ref 12.0–15.0)
MCH: 23.5 pg — ABNORMAL LOW (ref 26.0–34.0)
MCHC: 28.6 g/dL — ABNORMAL LOW (ref 30.0–36.0)
RDW: 14.6 % (ref 11.5–15.5)

## 2011-09-23 LAB — GLUCOSE, CAPILLARY
Glucose-Capillary: 144 mg/dL — ABNORMAL HIGH (ref 70–99)
Glucose-Capillary: 215 mg/dL — ABNORMAL HIGH (ref 70–99)

## 2011-09-23 LAB — BASIC METABOLIC PANEL
BUN: 12 mg/dL (ref 6–23)
Calcium: 9.5 mg/dL (ref 8.4–10.5)
GFR calc non Af Amer: 57 mL/min — ABNORMAL LOW (ref >90)
Glucose, Bld: 163 mg/dL — ABNORMAL HIGH (ref 70–99)
Sodium: 139 mEq/L (ref 135–145)

## 2011-09-23 NOTE — Progress Notes (Signed)
Occupational Therapy Evaluation Patient Details Name: Holly Kim MRN: [id number] DOB: 1941/07/20 Today's Date: 09/23/2011  Problem List:  Patient Active Problem List  Diagnoses  . DIABETES MELLITUS, II, COMPLICATIONS  . HYPERCHOLESTEROLEMIA  . Morbid obesity  . ANEMIA, NORMOCYTIC  . ANXIETY  . BORDERLINE PERSONALITY  . DEPRESSIVE DISORDER, NOS  . TARDIVE DYSKINESIA  . NEUROPATHY, PERIPHERAL  . CAD  . DEEP VEIN THROMBOPHLEBITIS, LEG  . VENOUS INSUFFICIENCY  . CHRONIC AIRWAY OBSTRUCTION NEC  . GERD  . CONSTIPATION  . BACK PAIN, CHRONIC  . CEREBROVAS DIS, LATE EFFECTS (S/P CVA)  . FATIGUE  . CHEST PAIN, ATYPICAL  . Abdominal pain, right upper quadrant  . ADULT MALTREATMENT UNSPECIFIED NEC  . Encounter for long-term (current) use of high-risk medication  . Abdominal pain  . Blindness - both eyes  . Impaired mobility  . Diastolic dysfunction  . Encounter for palliative care  . Dyspnea  . Restrictive lung disease  . Housing or economic circumstance  . Skin tag of female perineum  . Unstable angina    Past Medical History:  Past Medical History  Diagnosis Date  . PE (pulmonary embolism)   . DVT (deep venous thrombosis)   . Diabetes mellitus   . Asthma   . Hypertension   . CAD (coronary artery disease)     s/p pci rca in past; nstemi 12/2009 with rca occlusion/isr - 2.75 x 18-mm PROMUS DES placed.  residual ostial lcx dzs  . GERD (gastroesophageal reflux disease)   . COPD (chronic obstructive pulmonary disease)   . Obesity   . Tardive dyskinesia   . History of renal failure   . Depression   . Borderline personality disorder   . Anxiety   . Anemia     hx of blood transfusion x 3   Past Surgical History:  Past Surgical History  Procedure Date  . US echocardiography 2008  . Orif ankle fracture   . Appendectomy   . Cervical discectomy   . Ptca 06/2010  . Tonsillectomy   . Transthoracic echocardiogram 06/2010    Grade 1 diastolic, EF 55-60    OT  Assessment/Plan/Recommendation OT Assessment Clinical Impression Statement: Pt presents to OT with decreased I with all ADL activity,  Pt will benefit from skilled OT at SNF to increase I with ADL activity in order to return home with son.   OT Recommendation/Assessment: Patient will need skilled OT in the acute care venue OT Problem List: Decreased strength;Decreased activity tolerance OT Therapy Diagnosis : Generalized weakness;Acute pain OT Plan OT Frequency: Min 1X/week OT Treatment/Interventions: Self-care/ADL training;Therapeutic activities OT Recommendation Follow Up Recommendations: Skilled nursing facility Equipment Recommended: Defer to next venue OT Goals Acute Rehab OT Goals OT Goal Formulation: With patient ADL Goals Pt Will Perform Grooming: with supervision ADL Goal: Grooming - Progress: Progressing toward goals Pt Will Transfer to Toilet: with supervision;Comfort height toilet;3-in-1 ADL Goal: Toilet Transfer - Progress: Progressing toward goals  OT Evaluation Precautions/Restrictions  Precautions Precautions: Fall Restrictions Weight Bearing Restrictions: Yes LLE Weight Bearing: Weight bearing as tolerated Other Position/Activity Restrictions: Wear post-op boot when transferring/weightbearing on left foot      ADL ADL Eating/Feeding: Supervision/safety Where Assessed - Eating/Feeding: Edge of bed Grooming: Performed Where Assessed - Grooming: Sitting, bed;Unsupported Upper Body Bathing: Simulated;Minimal assistance Where Assessed - Upper Body Bathing: Sitting, bed;Unsupported Where Assessed - Lower Body Bathing: Sitting, bed;Unsupported Upper Body Dressing: Simulated Where Assessed - Upper Body Dressing: Sitting, bed;Unsupported Lower Body Dressing: Simulated;Maximal assistance Toilet Transfer:  Not assessed Toilet Transfer Method: Not assessed Toileting - Hygiene: Not assessed Where Assessed - Toileting Hygiene: Not assessed Tub/Shower Transfer Method:  Not assessed ADL Comments: Pt agreed to sit EOB.  Pt declined further activity.  Pt in pain and could not get comfortable after getting back in bed.  Encouraged pt to help OT with bed mobility and pt just wanted to be pulled up in bed.  OT made bed flat and pt eventually helped with  moving up in bed- with help of another person also.    Cognition Cognition Arousal/Alertness: Awake/alert Overall Cognitive Status: Appears within functional limits for tasks assessed Orientation Level: Oriented X4 Cognition - Other Comments: needs max encouragement to sit EOB.    Mobility  Bed Mobility Supine to Sit: 3: Mod assist;HOB elevated (Comment degrees) Sitting - Scoot to Edge of Bed: 4: Min assist Transfers Transfers: No    End of Session OT - End of Session Activity Tolerance: Patient limited by fatigue;Patient limited by pain Patient left: in bed;with call bell in reach General Behavior During Session: Flat affect (needed max encouragement) Cognition: WFL for tasks performed   Kirt Boys 09/23/2011, 2:26 PM

## 2011-09-23 NOTE — Progress Notes (Signed)
PGY-1 Daily Progress Note Family Medicine Teaching Service D. Piloto Rolene Arbour, MD Service Pager: [phone]  Patient name: Holly Kim  Medical record [id number] Date of birth:04-24-1941 Age: 70 y.o. Gender: female  LOS: 5 days   Subjective: No events overnight. Mild pain on her left foot.   Objective:  Vitals: Temp:98.5 F (12/10 0622) Pulse: 54  (12/10 0622) Resp: 18  (12/10 0622) BP: 144/88 mmHg (12/10 0622) SpO2:   99 % (12/10 0622) on 2 liters [place]  Physical Exam: General: alert, cooperative, no distress, morbidly obese  HEENT: Moist mucosa.  Heart: RRR. No murmurs, rubs or gallops.  Lungs: clear to auscultation, no wheezes or rales.  Abdomen: abdomen is soft without significant tenderness or guarding.  Extremities: edema bilateral and there are lacerations under 1st and 4th toes of left LE. Nurology: Oriented X 3. No focal motor deficits. No resting tremors.   Labs and imaging:  CBC  Lab 09/23/11 0655 09/20/11 1405 09/19/11 0620  WBC 4.3 7.0 7.1  HGB 8.1* 7.7* 7.0*  HCT 28.3* 26.7* 24.9*  PLT 163 138* 130*   BMET  Lab 09/23/11 0655 09/19/11 0620 09/18/11 2345  NA 139 139 135  K 4.2 4.2 4.5  CL 96 95* 92*  CO2 35* 40* 39*  BUN 12 20 21   CREATININE 0.97 1.20* 1.16*  LABGLOM -- -- --  GLUCOSE 163* -- --  CALCIUM 9.5 9.0 9.3   CBG's Low and mid 100's.   Medications: Current Facility-Administered Medications  Medication Dose Route Frequency Provider Last Rate Last Dose  .         Marland Kitchen acetaminophen (TYLENOL) tablet 650 mg  650 mg Oral Q6H PRN Evan Corey   650 mg at 09/23/11 0012  . albuterol (PROVENTIL) (5 MG/ML) 0.5% nebulizer solution 2.5 mg  2.5 mg Nebulization Q4H PRN Clementeen Graham      . antiseptic oral rinse (BIOTENE) solution 15 mL  15 mL Mouth Rinse BID Tobin Chad   15 mL at 09/22/11 2133  . aspirin EC tablet 81 mg  81 mg Oral Daily Evan Corey   81 mg at 09/22/11 0947  . bisoprolol (ZEBETA) tablet 5 mg  5 mg Oral Daily Evan Corey   5  mg at 09/22/11 0948  . ciprofloxacin (CIPRO) tablet 500 mg  500 mg Oral BID Dayarmys Piloto, MD   500 mg at 09/23/11 0751  . clopidogrel (PLAVIX) tablet 75 mg  75 mg Oral Daily Evan Corey   75 mg at 09/22/11 0948  . docusate sodium (COLACE) capsule 100 mg  100 mg Oral BID Evan Corey   100 mg at 09/22/11 2124  . famotidine (PEPCID) tablet 20 mg  20 mg Oral Daily Evan Corey   20 mg at 09/22/11 0948  . ferrous sulfate tablet 325 mg  325 mg Oral Daily Evan Corey   325 mg at 09/22/11 0948  . heparin injection 5,000 Units  5,000 Units Subcutaneous Q8H Evan Corey   5,000 Units at 09/23/11 5621  . insulin aspart (novoLOG) injection 0-20 Units  0-20 Units Subcutaneous TID WC Ardyth Gal, MD   7 Units at 09/23/11 0848  . insulin aspart (novoLOG) injection 0-5 Units  0-5 Units Subcutaneous QHS Ardyth Gal, MD      . insulin aspart (novoLOG) injection 6 Units  6 Units Subcutaneous TID WC Ardyth Gal, MD   6 Units at 09/23/11 0850  . insulin glargine (LANTUS) injection 30 Units  30 Units Subcutaneous QHS Ardyth Gal,  MD   30 Units at 09/22/11 2143  . lisinopril (PRINIVIL,ZESTRIL) tablet 5 mg  5 mg Oral Daily Evan Corey   5 mg at 09/22/11 0949  . metoCLOPramide (REGLAN) tablet 5 mg  5 mg Oral TID AC & HS Dayarmys Piloto, MD   5 mg at 09/23/11 0625  . morphine (MS CONTIN) 12 hr tablet 15 mg  15 mg Oral Q8H Evan Corey   15 mg at 09/23/11 0625  . nitroGLYCERIN (NITROSTAT) 0.4 MG SL tablet           . nystatin (NYSTOP) topical powder 100,000 Units  100,000 Units Topical BID Evan Corey   100,000 Units at 09/22/11 2134  . ondansetron (ZOFRAN) tablet 4 mg  4 mg Oral Q6H PRN Clementeen Graham      . pantoprazole (PROTONIX) EC tablet 40 mg  40 mg Oral Q1200 Evan Corey   40 mg at 09/22/11 1200  . rosuvastatin (CRESTOR) tablet 40 mg  40 mg Oral Daily Evan Corey   40 mg at 09/22/11 0949  . senna (SENOKOT) tablet 25.8 mg  3 tablet Oral QHS PRN Evan Corey   25.8 mg at 09/20/11 0502  . sodium chloride  0.9 % injection 3 mL  3 mL Intravenous Q12H Evan Corey   3 mL at 09/22/11 2131  . sodium chloride 0.9 % injection 3 mL  3 mL Intravenous PRN Clementeen Graham      . sucralfate (CARAFATE) tablet 1 g  1 g Oral TID AC Evan Corey   1 g at 09/23/11 1610  . torsemide (DEMADEX) tablet 20 mg  20 mg Oral Daily Evan Corey   20 mg at 09/22/11 0950   Assessment and Plan:  [name] is a 70 y.o. year old female presenting with generalized weakness and trauma of left foot.  1. Generalized weakness: Most likely deconditioning. Has improved per pt statement.  - up with assistance, bed side commode.  2. Trauma of left foot: Ortho consulted her yesterday and recommended local wound care daily and progress with ambulation as tolerated in a hard sole shoe with follow up in 10 days.  3. UTI: Culture back grew >100 000 col of Klebsiella P. Sensitive to cipro with MIC of 0.25.  - Started Pt on Ciprofloxacin 500 for 7 days.  4. Anemia: Hb at her baseline.   5. DM: CBG's on low mid to low 100's. lantus 30 units. No hypoglycemic events  -Continue Lantus 30 and resistant SSI. Continue monitoring CBG's.  6. Hx of recent angioplasty: No chest pain, no palpitations. No alarms on monitor.  - Telemetry monitor if she become symptomatic.   8. Chronic airway obstruction. Restrictive Lung disease (morbid obesity): CO2 40 Pt baseline.  - O2 nasal  [place] as per home requirement.    7. FEN/GI: CHO modified diet.  8. Prophylaxis: Heparin. Pantoprazole 9. Disposition: Pending placement in a SNF.  D. Piloto Rolene Arbour, MD PGY1, [facility] Medicine Teaching Service Pager [phone] 09/23/2011

## 2011-09-23 NOTE — Discharge Summary (Signed)
Physician Discharge Summary  Patient ID: Holly Kim MRN: [id number] DOB:[birth year]                                 Age:  70 y.o.  Admit date: 09/18/2011 Discharge date: 09/24/11  PCP: Burman Blacksmith., MD   Discharge Diagnosis: 1. Left Foot Trauma. 2. UTI ( Klebsiella P.) 3.  CAD s/p multiple stents in the past and also s/p angioplasty Nov 2012  4.   DM II. 5.   Morbid obesity 6.  ANEMIA, NORMOCYTIC   7.  CHRONIC AIRWAY OBSTRUCTION. Restrictive lung disease.  8.  GERD  9.  HYPERCHOLESTEROLEMIA   10.  ANXIETY  11  BORDERLINE PERSONALITY  12. DEPRESSIVE DISORDER 13.TARDIVE DYSKINESIA  14. NEUROPATHY, PERIPHERAL  15  VENOUS INSUFFICIENCY  16. CONSTIPATION  17. BACK PAIN, CHRONIC   Discharge Medications   Home Medication Instructions    Medication Information    docusate sodium (COLACE) 100 MG capsule Take 1 capsule (100 mg total) by mouth 2 (two) times daily.   lisinopril (PRINIVIL,ZESTRIL) 5 MG tablet Take 1 tablet (5 mg total) by mouth daily.   Multiple Vitamins-Minerals (SENIOR MULTIVITAMIN PLUS) TABS Take 1 tablet by mouth daily.   promethazine (PHENERGAN) 25 MG tablet Take 1 tablet (25 mg total) by mouth every 6 (six) hours as needed for nausea.   ferrous sulfate 325 (65 FE) MG tablet Take 1 tablet (325 mg total) by mouth daily.   ranitidine (ZANTAC) 150 MG capsule Take 1 capsule (150 mg total) by mouth 2 (two) times daily.   nitroGLYCERIN (NITROSTAT) 0.4 MG SL tablet Place 1 tablet (0.4 mg total) under the tongue every 5 (five) minutes as needed for chest pain.   morphine (MS CONTIN) 15 MG 12 hr tablet Take 1 tablet (15 mg total) by mouth 3 (three) times daily.   albuterol (PROVENTIL) (2.5 MG/3ML) 0.083% nebulizer solution Take 2.5 mg by nebulization every 6 (six) hours as needed. Do not use with albuterol inhaler.    clopidogrel (PLAVIX) 75 MG tablet Take 75 mg by mouth daily.     rosuvastatin (CRESTOR) 40 MG tablet Take 40 mg by mouth  daily.     insulin aspart (NOVOLOG) 100 UNIT/ML injection Inject 16-24 Units into the skin 3 (three) times daily before meals. 16 units prior to breakfast, 20 units prior to lunch, 24 prior units to dinner    metoCLOPramide (REGLAN) 5 MG tablet Take 5 mg by mouth 4 (four) times daily.     senna (SENOKOT) 8.6 MG tablet Take 3 tablets by mouth daily.     sucralfate (CARAFATE) 1 G tablet Take 1 g by mouth 3 (three) times daily before meals.     torsemide (DEMADEX) 20 MG tablet Take 20 mg by mouth daily.     nystatin (MYCOSTATIN) powder Apply 1 Units topically 2 (two) times daily. Apply to affected area     aspirin 81 MG EC tablet Take 81 mg by mouth daily.     insulin glargine (LANTUS) 100 UNIT/ML injection Inject 55 Units into the skin daily.     nystatin cream (MYCOSTATIN) Apply 1 application topically 2 (two) times daily.     omeprazole (PRILOSEC) 40 MG capsule Take 40 mg by mouth daily.     bisoprolol (ZEBETA) 5 MG tablet Take 5 mg by mouth daily.        Consults: Ortho. Dr. Dorene Grebe  Labs: CBC  Lab  09/23/11 0655 09/20/11 1405 09/19/11 0620  WBC 4.3 7.0 7.1  HGB 8.1* 7.7* 7.0*  HCT 28.3* 26.7* 24.9*  PLT 163 138* 130*   BMET  Lab 09/23/11 0655 09/19/11 0620 09/18/11 2345  NA 139 139 135  K 4.2 4.2 4.5  CL 96 95* 92*  CO2 35* 40* 39*  BUN 12 20 21   CREATININE 0.97 1.20* 1.16*  CALCIUM 9.5 9.0 9.3  PROT -- 6.2 6.7  BILITOT -- 0.2* 0.2*  ALKPHOS -- 53 52  ALT -- 17 17  AST -- 21 20  GLUCOSE 163* 83 90   Urine Culture: URINE, CATHETERIZED Special Requests 1 dose yesterday of ancef Normal Setup Time [id number] Colony Count >=100,000 COLONIES/ML Culture KLEBSIELLA PNEUMONIAE Report Status 09/21/2011 FINAL Organism ID, Bacteria KLEBSIELLA PNEUMONIAE   Procedures/Imaging:  Dg Chest 2 View 09/19/2011   IMPRESSION:  1.  No displaced rib fractures seen. 2.  Worsening vascular congestion and mild cardiomegaly, without significant edema. 3.  Mildly  worsened retrocardiac airspace opacification likely reflects atelectasis.  Original Report Authenticated By: Tonia Ghent, M.D.   Dg Chest 2 View 09/10/2011   IMPRESSION: Cardiomegaly with vascular congestion and chronic peribronchial thickening.  Chronic left basilar opacity, presumably atelectasis.  Original Report Authenticated By: Cyndie Chime, M.D.   Dg Foot Complete Left 09/19/2011   IMPRESSION:  1.  Mildly comminuted fracture at the first proximal phalanx, with likely intra-articular extension noted proximally. 2.  Minimally displaced fractures through the proximal second and fourth proximal phalanges, with intra-articular extension at the second phalanx. 3.  Evaluation suboptimal to extensive osteopenia. 4.  Diffuse vascular calcifications seen. 5.  Os naviculare and os peroneum seen.  Original Report Authenticated By: Tonia Ghent, M.D.     Brief Hospital Course:  Holly Kim is a 70 y.o. year old female presenting with generalized weakness and trauma of left foot.   1. Generalized weakness: Most likely deconditioning. Has improved during hospitalization. 2. Trauma of left foot: Stable during hospitalization. No open or displaced fracture. Presents lacerations on her first and  tourth toes no complicated and healing properly. Ortho consulted and recommend local wound care and  ambulation as tolerated in a hard sole shoe with follow up in 10 days. 3. UTI: Had some frequency in the past, no other symptoms. Culture back grew >100 000 col of Klebsiella P. Sensitive to cipro with MIC of 0.25. Started on 09/21/11 Ciprofloxacin 500 for a total course of  7 days.  4. Anemia: Hb on admission at 7.5 and was lower to 7.0 but same day a repeated test showed 7.7. At discharge Hb at her baseline. 5. DM: CBG's on low mid to low 100's. lantus 30 units. No hypoglycemic events  6. Hx of recent angioplasty: No chest pain, no palpitations. No alarms on monitor during hospital stay. 7. Chronic  airway obstruction. Restrictive Lung disease (morbid obesity): CO2 40 Pt baseline. No episodes of desaturations. No new O2 requerements.  Patient condition at time of discharge/disposition:  Patient is discharge to a Skilled Nurse facility on stable medical condition.   Follow up issues: 1. With Ortho in 10 days. 2. F/u with PCP.Evaluate antibiotic compliance and resolution of UTI.   Discharge follow up:  Discharge Orders    Future Appointments: Provider: Department: Dept Phone: Center:   09/25/2011 1:00 PM Evern Bio, NP [facility] [phone] [facility]   10/03/2011 1:30 PM Wyona Almas, RD [facility] [phone] [facility]   10/03/2011 1:30 PM Sarah T.  [place] [facility] [phone] [facility]      D. Piloto Rolene Arbour, MD  [facility] 09/23/2011

## 2011-09-23 NOTE — Progress Notes (Signed)
I discussed with Dr Piloto.  I agree with their plans documented in their  Note for today.  

## 2011-09-23 NOTE — Progress Notes (Signed)
PT Cancellation Note  ___Treatment cancelled today due to medical issues with patient which prohibited therapy  ___ Treatment cancelled today due to patient receiving procedure or test   _x__ Treatment cancelled today due to patient's refusal to participate.  She states that her gallbladder is hurting her.  She has already been premedicated and is not due anymore pain meds per RN.  PT to check back at a later date.    ___ Treatment cancelled today due to   Signature: Nasreen Goedecke B. Magdelene Ruark, PT, DPT [phone]

## 2011-09-23 NOTE — Progress Notes (Signed)
   CARE MANAGEMENT NOTE 09/23/2011  Patient:  Holly Kim, Holly Kim   Account Number:  [id number]  Date Initiated:  09/23/2011  Documentation initiated by:  Marolyn Urschel  Subjective/Objective Assessment:   Order for HH,however pt will d/c to SNF     Action/Plan:   Pt to d/c to SNF.   Anticipated DC Date:  09/23/2011   Anticipated DC Plan:  SKILLED NURSING FACILITY         Choice offered to / List presented to:             Status of service:  Completed, signed off Medicare Important Message given?   (If response is "NO", the following Medicare IM given date fields will be blank) Date Medicare IM given:   Date Additional Medicare IM given:    Discharge Disposition:  SKILLED NURSING FACILITY  Per UR Regulation:  Reviewed for med. necessity/level of care/duration of stay  Comments:  Pt to d/c to [facility], SNF today.

## 2011-09-23 NOTE — Progress Notes (Signed)
Clinical Child psychotherapist (CSW) received and provided pt with bed offers. Pt has been offered placement at [facility] skilled nursing facility. Pt appeared pleased with information and confirmed she would like to proceed with placement however she does not feel well today and would prefer to dc tomorrow. CSW informed pt that MD would decide when pt was medically ready. CSW tried to inform pt son [name] of bed offers however CSW was unable to leave a message on either of the two numbers CSW was given: [phone].  CSW left a message for [facility] informing them that pt would like to accept bed offer. CSW will continue to follow. [name], MSW, [facility] [phone]

## 2011-09-23 NOTE — Progress Notes (Signed)
Clinical Child psychotherapist (CSW) contacted [facility] skilled nursing facility to confirm if they are still able to provide pt with a bed offer. CSW informed facility that pt also has Medicare in addition to Medicaid. [facility] stated they would follow up with DSS tomorrow to confirm Medicaid however will be able to offer placement for pt tomorrow considering she has Medicare. CSW informed MD and will assist with dc planning tomorrow. [name], MSW, [facility] [phone]

## 2011-09-24 MED ORDER — CIPROFLOXACIN HCL 500 MG PO TABS: 500.0000 mg | ORAL_TABLET | Freq: Two times a day (BID) | ORAL | Status: AC

## 2011-09-24 MED ORDER — MORPHINE SULFATE CR 15 MG PO TB12: 15.0000 mg | ORAL_TABLET | Freq: Three times a day (TID) | ORAL | Status: DC

## 2011-09-24 NOTE — Progress Notes (Signed)
I discussed with Dr Aviva Signs.  I agree with their plans documented in their  Note for today.

## 2011-09-24 NOTE — Progress Notes (Signed)
PGY-1 Daily Progress Note Family Medicine Teaching Service D. Piloto Rolene Arbour, MD Service Pager: [phone]  Patient name: Holly Kim  Medical record [id number] Date of birth:03-01-1941 Age: 70 y.o. Gender: female  LOS: 6 days   Subjective:  No events overnight.  Objective:  Vitals: Temp:  99 F (37.2 C) (12/11 0548) Pulse Rate: 57  (12/11 0548) Resp:   19  (12/11 0548) BP: 169/73 mmHg (12/11 0548) SpO2:  98 % (12/11 0548) on 2 L FiO2 (%):  97 % (12/10 2150)  Physical Exam:  General: alert, cooperative, no distress, morbidly obese   HEENT: Moist mucosa.   Heart: RRR. No murmurs, rubs or gallops.   Lungs: clear to auscultation, no wheezes or rales.   Abdomen: abdomen is soft without significant tenderness or guarding.   Extremities: edema bilateral and there are lacerations under 1st and 4th toes of left LE. Nurology: Oriented X 3. No focal motor deficits. No resting tremors.   Labs and imaging:  CBC  Lab 09/23/11 0655 09/20/11 1405 09/19/11 0620  WBC 4.3 7.0 7.1  HGB 8.1* 7.7* 7.0*  HCT 28.3* 26.7* 24.9*  PLT 163 138* 130*   BMET  Lab 09/23/11 0655 09/19/11 0620 09/18/11 2345  NA 139 139 135  K 4.2 4.2 4.5  CL 96 95* 92*  CO2 35* 40* 39*  BUN 12 20 21   CREATININE 0.97 1.20* 1.16*  LABGLOM -- -- --  GLUCOSE 163* -- --  CALCIUM 9.5 9.0 9.3     Medications: Current Facility-Administered Medications  Medication Route Frequency Last Dose  . acetaminophen (TYLENOL) tablet 650 mg Oral Q6H PRN 650 mg at 09/23/11 1144     . albuterol (PROVENTIL) (5 MG/ML) 0.5% nebulizer solution 2.5 mg Nebulization Q4H PRN    . antiseptic oral rinse (BIOTENE) solution 15 mL Mouth Rinse BID 15 mL at 09/22/11 2133  . aspirin EC tablet 81 mg Oral Daily 81 mg at 09/23/11 1138  . bisoprolol (ZEBETA) tablet 5 mg Oral Daily 5 mg at 09/23/11 1006  . ciprofloxacin (CIPRO) tablet 500 mg Oral BID 500 mg at 09/24/11 0900  . clopidogrel (PLAVIX) tablet 75 mg Oral Daily 75 mg  at 09/23/11 1006  . docusate sodium (COLACE) capsule 100 mg Oral BID 100 mg at 09/23/11 2130  . famotidine (PEPCID) tablet 20 mg Oral Daily 20 mg at 09/23/11 1007  . ferrous sulfate tablet 325 mg Oral Daily 325 mg at 09/23/11 1008  . heparin injection 5,000 Units Subcutaneous Q8H 5,000 Units at 09/24/11 0640  . insulin aspart (novoLOG) injection 0-20 Units Subcutaneous TID WC 4 Units at 09/24/11 0653  . insulin aspart (novoLOG) injection 0-5 Units Subcutaneous QHS 2 Units at 09/23/11 2135  . insulin aspart (novoLOG) injection 6 Units Subcutaneous TID WC 6 Units at 09/24/11 0914  . insulin glargine (LANTUS) injection 30 Units Subcutaneous QHS 30 Units at 09/23/11 2133  . lisinopril (PRINIVIL,ZESTRIL) tablet 5 mg Oral Daily 5 mg at 09/23/11 1009  . metoCLOPramide (REGLAN) tablet 5 mg Oral TID AC & HS 5 mg at 09/24/11 2841  . morphine (MS CONTIN) 12 hr tablet 15 mg Oral Q8H 15 mg at 09/24/11 0637  . nystatin (NYSTOP) topical powder 100,000 Units Topical BID 100,000 Units at 09/23/11 2142  . ondansetron (ZOFRAN) tablet 4 mg Oral Q6H PRN 4 mg at 09/23/11 2036  . pantoprazole (PROTONIX) EC tablet 40 mg Oral Q1200 40 mg at 09/23/11 1245  . rosuvastatin (CRESTOR) tablet 40 mg Oral Daily 40  mg at 09/23/11 1010  . senna (SENOKOT) tablet 25.8 mg Oral QHS PRN 25.8 mg at 09/20/11 0502  . sodium chloride 0.9 % injection 3 mL Intravenous Q12H 3 mL at 09/23/11 2142  . sodium chloride 0.9 % injection 3 mL Intravenous PRN    . sucralfate (CARAFATE) tablet 1 g Oral TID AC 1 g at 09/24/11 1610  . torsemide (DEMADEX) tablet 20 mg Oral Daily 20 mg at 09/23/11 1011   Assessment and Plan: Holly Kim is a 70 y.o. Year old female presenting with generalized weakness and trauma of left foot.     1. Generalized weakness: Most likely deconditioning. Has improved per pt statement.   - up with assistance, bed side commode.  2. Trauma of left foot: Ortho consulted her yesterday and recommended local wound care  daily and progress with ambulation as tolerated in a hard sole shoe with follow up in 10 days.  3. UTI: Culture back grew >100 000 col of Klebsiella P. Sensitive to cipro with MIC of 0.25.   - Started Pt on Ciprofloxacin 500 for 7 days.  4. Anemia: Hb at her baseline.   5. DM: CBG's on low mid to low 100's. lantus 30 units. No hypoglycemic events   -Continue Lantus 30 and resistant SSI. Continue monitoring CBG's.  6. Hx of recent angioplasty: No chest pain, no palpitations.   - Telemetry monitor if she become symptomatic.   8. Chronic airway obstruction. Restrictive Lung disease (morbid obesity): CO2 40 Pt baseline.   - O2 nasal  [place] as per home requirement.      FEN/GI: CHO modified diet.   Prophylaxis: Heparin. Pantoprazole 9. Disposition: Pending placement in a SNF    D. Piloto Rolene Arbour, MD PGY1, [facility] Medicine Teaching Service Pager [phone] 09/24/2011

## 2011-09-24 NOTE — Discharge Summary (Signed)
I discussed with Dr Piloto.  I agree with their plans documented in their  Note for today.  

## 2011-09-24 NOTE — Progress Notes (Signed)
Physical Therapy Treatment Patient Details Name: Holly Kim MRN: [id number] DOB: [birth year] Today's Date: 09/24/2011  PT Assessment/Plan  PT - Assessment/Plan Comments on Treatment Session: Pt only agreeable to transfer to 3n1 and not to recliner.  Pt reported not able to walk for 20 years due to salesman selling her a scooter and she stopped walking.  Pt seems to be self limiting and believe pt could walk.  Discussed with pt about using RW to begin walking.  Pt seems more anxious about walking.  Pt able to take small steps to 3n1.  Will continuet work with patient and encourage ambulation as well as improve transfers and overall mobility and strength. PT Plan: Discharge plan remains appropriate PT Frequency: Min 3X/week Follow Up Recommendations: Skilled nursing facility Equipment Recommended: Defer to next venue PT Goals  Acute Rehab PT Goals PT Goal Formulation: With patient Time For Goal Achievement: 2 weeks Pt will go Supine/Side to Sit: with supervision;with HOB not 0 degrees (comment degree);with rail PT Goal: Supine/Side to Sit - Progress: Not met Pt will go Sit to Supine/Side: with supervision;with HOB not 0 degrees (comment degree);with rail PT Goal: Sit to Supine/Side - Progress: Progressing toward goal Pt will go Sit to Stand: with supervision PT Goal: Sit to Stand - Progress: Progressing toward goal Pt will Transfer Bed to Chair/Chair to Bed: with supervision PT Transfer Goal: Bed to Chair/Chair to Bed - Progress: Progressing toward goal Additional Goals Additional Goal #1: Pt will be able to ambulate 15 feet with min (A) and RW. PT Goal: Additional Goal #1 - Progress: Not met  PT Treatment Precautions/Restrictions  Precautions Precautions: Fall Restrictions Weight Bearing Restrictions: Yes LLE Weight Bearing: Weight bearing as tolerated Other Position/Activity Restrictions: Wear post-op boot when transferring/weightbearing on left foot Mobility (including  Balance) Bed Mobility Bed Mobility: Yes Supine to Sit: 4: Min assist;HOB elevated (Comment degrees) Supine to Sit Details (indicate cue type and reason): VCs for hand placement and (A) with trunk OOB. Sitting - Scoot to Edge of Bed: 5: Supervision Sitting - Scoot to [place] of Bed Details (indicate cue type and reason): Supervision for safety and VCs for hand placement Transfers Transfers: Yes Sit to Stand: 4: Min assist;With upper extremity assist;With armrests;From bed;From chair/3-in-1 Sit to Stand Details (indicate cue type and reason): VCs for hand placement and (A) to maintain balance in from sit to stand transition Stand to Sit: 4: Min assist;With upper extremity assist;With armrests;To chair/3-in-1 Stand to Sit Details: VCs for hand placement and (A) to slowly descend Stand Pivot Transfers: 4: Min assist;With armrests;From elevated surface Stand Pivot Transfer Details (indicate cue type and reason): VCs for technique and HHA to comlete transfer.  Transfered from bed -> 3n1 -> bed.  Pt refused to sit in recliner. Ambulation/Gait Ambulation/Gait: No Stairs: No Wheelchair Mobility Wheelchair Mobility: No  Posture/Postural Control Posture/Postural Control: No significant limitations Balance Balance Assessed: Yes Static Sitting Balance Static Sitting - Balance Support: Bilateral upper extremity supported Static Sitting - Level of Assistance: 6: Modified independent (Device/Increase time);Other (comment) (needs UE support to maintain balance) Static Sitting - Comment/# of Minutes: Pt sat EOB ~5 minutes.  Pt continue to lean posterior without UE support Exercise    End of Session PT - End of Session Equipment Utilized During Treatment: Gait belt (wide 3n1) Activity Tolerance: Patient limited by fatigue;Patient limited by pain;Other (comment) (Pt seems to be self limiting) Patient left: in bed;with call bell in reach Nurse Communication: Mobility status for  transfers General Behavior During  Session: Flat affect Cognition: WFL for tasks performed  Holly Kim 09/24/2011, 2:29 PM [phone]

## 2011-09-24 NOTE — Progress Notes (Signed)
Clinical Child psychotherapist (CSW) confirmed with Administrator, sports at [facility] that pt is okay to transfer to facility once dc summary has been completed. CSW will contact MD to confirm dc, fax necessary paperwork to facility and contact [facility] for transportation.  [name], MSW, [facility] [phone]

## 2011-09-25 ENCOUNTER — Encounter: Payer: Medicare Other | Attending: Neurosurgery | Admitting: Neurosurgery

## 2011-09-30 ENCOUNTER — Inpatient Hospital Stay: Payer: Medicare Other | Admitting: Family Medicine

## 2011-10-01 ENCOUNTER — Telehealth: Payer: Self-pay | Admitting: Family Medicine

## 2011-10-01 NOTE — Telephone Encounter (Signed)
Trying to find out if pt has a cardiologist?

## 2011-10-03 ENCOUNTER — Ambulatory Visit: Payer: Medicare Other | Admitting: Family Medicine

## 2011-10-03 ENCOUNTER — Telehealth: Payer: Self-pay | Admitting: Family Medicine

## 2011-10-03 NOTE — Telephone Encounter (Signed)
She needs to be referred to [facility] clinic - f/u stint Also needs referral for [facility] - Dr. Dorene Grebe - f/u broken toes

## 2011-10-03 NOTE — Telephone Encounter (Signed)
If she has been seen by those practices before (either as an outpatient or in the hospital), she should be able to call and make the appointments.  If she does need a referral for some reason, I would be happy to do it.  Could you let [name] know?

## 2011-10-24 ENCOUNTER — Emergency Department (HOSPITAL_COMMUNITY)
Admission: EM | Admit: 2011-10-24 | Discharge: 2011-10-24 | Disposition: A | Discharge status: Home / Self Care | Payer: Medicare Other | Attending: Emergency Medicine | Admitting: Emergency Medicine

## 2011-10-24 ENCOUNTER — Encounter (HOSPITAL_COMMUNITY): Payer: Self-pay

## 2011-10-24 DIAGNOSIS — Z794 Long term (current) use of insulin: Secondary | ICD-10-CM | POA: Insufficient documentation

## 2011-10-24 DIAGNOSIS — Z79899 Other long term (current) drug therapy: Secondary | ICD-10-CM | POA: Insufficient documentation

## 2011-10-24 DIAGNOSIS — K219 Gastro-esophageal reflux disease without esophagitis: Secondary | ICD-10-CM | POA: Insufficient documentation

## 2011-10-24 DIAGNOSIS — I10 Essential (primary) hypertension: Secondary | ICD-10-CM | POA: Insufficient documentation

## 2011-10-24 DIAGNOSIS — E119 Type 2 diabetes mellitus without complications: Secondary | ICD-10-CM | POA: Insufficient documentation

## 2011-10-24 DIAGNOSIS — J449 Chronic obstructive pulmonary disease, unspecified: Secondary | ICD-10-CM | POA: Insufficient documentation

## 2011-10-24 DIAGNOSIS — R259 Unspecified abnormal involuntary movements: Secondary | ICD-10-CM | POA: Insufficient documentation

## 2011-10-24 DIAGNOSIS — Z7982 Long term (current) use of aspirin: Secondary | ICD-10-CM | POA: Insufficient documentation

## 2011-10-24 DIAGNOSIS — F341 Dysthymic disorder: Secondary | ICD-10-CM | POA: Insufficient documentation

## 2011-10-24 DIAGNOSIS — I2510 Atherosclerotic heart disease of native coronary artery without angina pectoris: Secondary | ICD-10-CM | POA: Insufficient documentation

## 2011-10-24 DIAGNOSIS — J4489 Other specified chronic obstructive pulmonary disease: Secondary | ICD-10-CM | POA: Insufficient documentation

## 2011-10-24 DIAGNOSIS — Z86718 Personal history of other venous thrombosis and embolism: Secondary | ICD-10-CM | POA: Insufficient documentation

## 2011-10-24 DIAGNOSIS — R251 Tremor, unspecified: Secondary | ICD-10-CM

## 2011-10-24 MED ORDER — LORAZEPAM 1 MG PO TABS
1.0000 mg | ORAL_TABLET | Freq: Once | ORAL | Status: AC
  Administered 2011-10-24: 1 mg via ORAL
  Filled 2011-10-24: qty 1

## 2011-10-24 MED ORDER — LORAZEPAM 1 MG PO TABS: 1.0000 mg | ORAL_TABLET | Freq: Three times a day (TID) | ORAL | Status: AC | PRN

## 2011-10-24 NOTE — ED Provider Notes (Signed)
History     CSN: [id number]  Arrival date & time 10/24/11  1638   First MD Initiated Contact with Patient 10/24/11 1710      Chief Complaint  Patient presents with  . Tremors    (Consider location/radiation/quality/duration/timing/severity/associated sxs/prior treatment) The history is provided by the patient.   71 year old female comes in complaining of episodes of tremors. She has had these episodes for a long time and they have been controlled on lorazepam. 2 months ago, she was taken off of lorazepam, and the episodes of tremors have gotten worse. The episodes are intermittent and she will sometimes go several days without an episode. However, nothing has controlled him well since she was taken off of lorazepam. She describes her symptoms as severe.  Past Medical History  Diagnosis Date  . PE (pulmonary embolism)   . DVT (deep venous thrombosis)   . Diabetes mellitus   . Asthma   . Hypertension   . CAD (coronary artery disease)     s/p pci rca in past; nstemi 12/2009 with rca occlusion/isr - 2.75 x 18-mm PROMUS DES placed.  residual ostial lcx dzs  . GERD (gastroesophageal reflux disease)   . COPD (chronic obstructive pulmonary disease)   . Obesity   . Tardive dyskinesia   . History of renal failure   . Depression   . Borderline personality disorder   . Anxiety   . Anemia     hx of blood transfusion x 3    Past Surgical History  Procedure Date  . US echocardiography 2008  . Orif ankle fracture   . Appendectomy   . Cervical discectomy   . Ptca 06/2010  . Tonsillectomy   . Transthoracic echocardiogram 06/2010    Grade 1 diastolic, EF 55-60    Family History  Problem Relation Age of Onset  . Breast cancer      aunt  . Alcohol abuse Son   . Cancer Daughter   . Leukemia Father     deceased  . Alzheimer's disease Mother     deceased    History  Substance Use Topics  . Smoking status: Passive Smoker  . Smokeless tobacco: Never Used   Comment: never smoked  - children smoke - lives with son.  . Alcohol Use: No    OB History    Grav Para Term Preterm Abortions TAB SAB Ect Mult Living                  Review of Systems  All other systems reviewed and are negative.    Allergies  Ibuprofen; Amlodipine besylate; Amoxicillin; Codeine; Cortisone; Haloperidol lactate; and Naproxen  Home Medications   Current Outpatient Rx  Name Route Sig Dispense Refill  . ALBUTEROL SULFATE (2.5 MG/3ML) 0.083% IN NEBU Nebulization Take 2.5 mg by nebulization every 6 (six) hours as needed. For wheezing    . ASPIRIN 81 MG PO TBEC Oral Take 81 mg by mouth daily.      Marland Kitchen BISOPROLOL FUMARATE 5 MG PO TABS Oral Take 5 mg by mouth daily.      Marland Kitchen DOCUSATE SODIUM 100 MG PO CAPS Oral Take 1 capsule (100 mg total) by mouth 2 (two) times daily. 100 capsule 1  . FERROUS SULFATE 325 (65 FE) MG PO TABS Oral Take 1 tablet (325 mg total) by mouth daily. 90 tablet 2  . INSULIN ASPART 100 UNIT/ML [place] SOLN Subcutaneous Inject 16-24 Units into the skin 3 (three) times daily before meals. 16 units prior  to breakfast, 20 units prior to lunch, 24 prior units to dinner    . INSULIN GLARGINE 100 UNIT/ML [place] SOLN Subcutaneous Inject 55 Units into the skin daily.      Marland Kitchen LISINOPRIL 5 MG PO TABS Oral Take 1 tablet (5 mg total) by mouth daily. 90 tablet 0  . METOCLOPRAMIDE HCL 5 MG PO TABS Oral Take 5 mg by mouth 4 (four) times daily.      . MORPHINE SULFATE ER 15 MG PO TB12 Oral Take 1 tablet (15 mg total) by mouth 3 (three) times daily. 90 tablet 0  . SENIOR MULTIVITAMIN PLUS PO TABS Oral Take 1 tablet by mouth daily. 100 tablet 1  . NITROGLYCERIN 0.4 MG SL SUBL Sublingual Place 0.4 mg under the tongue every 5 (five) minutes as needed. For chest pain    . NYSTATIN 100000 UNIT/GM EX POWD Topical Apply 1 Units topically 2 (two) times daily.     . NYSTATIN 100000 UNIT/GM EX CREA Topical Apply 1 application topically 2 (two) times daily.      Marland Kitchen OMEPRAZOLE 40 MG PO CPDR Oral Take 40 mg by mouth  daily.      Marland Kitchen PROMETHAZINE HCL 25 MG PO TABS Oral Take 25 mg by mouth every 6 (six) hours as needed. For nausea    . RANITIDINE HCL 150 MG PO CAPS Oral Take 1 capsule (150 mg total) by mouth 2 (two) times daily. 120 capsule 2  . ROSUVASTATIN CALCIUM 40 MG PO TABS Oral Take 40 mg by mouth daily.      . SENNOSIDES 8.6 MG PO TABS Oral Take 3 tablets by mouth daily.      . SUCRALFATE 1 G PO TABS Oral Take 1 g by mouth 3 (three) times daily before meals.      . TORSEMIDE 20 MG PO TABS Oral Take 20 mg by mouth daily.      Marland Kitchen CLOPIDOGREL BISULFATE 75 MG PO TABS Oral Take 75 mg by mouth daily.        BP 114/62  Pulse 72  Temp(Src) 99.1 F (37.3 C) (Oral)  Physical Exam  Nursing note and vitals reviewed.  71 year old female somewhat obese and in no acute distress. Vital signs are normal. Head is normocephalic atraumatic. PERRLA, EOMI. Neck is nontender and supple without adenopathy or JVD. Lungs are clear without rales, wheezes, rhonchi. Heart has regular rate and rhythm without murmur. Abdomen is soft, flat, nontender without masses or hepatosplenomegaly. Extremities: Left foot and lower leg have a dressing and cast shoe in place which are not removed. Both lower legs are tender to palpation without any focal swelling. There is generalized erythema of the skin of the lower legs. Changes appear mostly due to chronic venous stasis. Skin has no other rashes. Neurologic: Patient appears somewhat anxious. There is a coarse and irregular tremor present of all extremities. It does not have the typical cogwheeling of Parkinson's disease. Cranial nerves are intact and there no focal motor or sensory deficits.  ED Course  Procedures (including critical care time)  Labs Reviewed - No data to display No results found.   No diagnosis found.  She was given a dose of Ativan with significant improvement.  MDM  Tremor which is episodic worse since discontinuing benzodiazepines. Of note, she is taking  metoclopramide which can cause of movement disorders and she may need to come off of that and be switched to a different antiemetic. She will be given a trial of a dose of  lorazepam in the ED.        Dione Booze, MD 10/24/11 [phone]

## 2011-10-24 NOTE — ED Notes (Signed)
Per ems- pt c/o tremors since last night. Pt has hx of tremors but they were controlled with xanax. Pt has since been taken off xanax and tremors have gotten worse. Pt appears very anxious. NAD noted at this time.

## 2011-10-24 NOTE — ED Notes (Signed)
Pt d/c home with [facility]. NAd noted at this time. Pt given prescription for Ativan

## 2011-10-26 ENCOUNTER — Emergency Department (HOSPITAL_COMMUNITY): Payer: Medicare Other

## 2011-10-26 ENCOUNTER — Encounter (HOSPITAL_COMMUNITY): Payer: Self-pay | Admitting: Physical Medicine and Rehabilitation

## 2011-10-26 ENCOUNTER — Other Ambulatory Visit: Payer: Self-pay

## 2011-10-26 ENCOUNTER — Inpatient Hospital Stay (HOSPITAL_COMMUNITY)
Admission: EM | Admit: 2011-10-26 | Discharge: 2011-10-29 | DRG: 690 | Disposition: A | Discharge status: Skilled Nursing Facility | Payer: Medicare Other | Attending: Family Medicine | Admitting: Family Medicine

## 2011-10-26 DIAGNOSIS — T450X5A Adverse effect of antiallergic and antiemetic drugs, initial encounter: Secondary | ICD-10-CM | POA: Diagnosis present

## 2011-10-26 DIAGNOSIS — F329 Major depressive disorder, single episode, unspecified: Secondary | ICD-10-CM | POA: Diagnosis present

## 2011-10-26 DIAGNOSIS — K5900 Constipation, unspecified: Secondary | ICD-10-CM | POA: Diagnosis present

## 2011-10-26 DIAGNOSIS — I5189 Other ill-defined heart diseases: Secondary | ICD-10-CM

## 2011-10-26 DIAGNOSIS — H543 Unqualified visual loss, both eyes: Secondary | ICD-10-CM | POA: Diagnosis present

## 2011-10-26 DIAGNOSIS — L89309 Pressure ulcer of unspecified buttock, unspecified stage: Secondary | ICD-10-CM | POA: Diagnosis present

## 2011-10-26 DIAGNOSIS — N390 Urinary tract infection, site not specified: Principal | ICD-10-CM | POA: Diagnosis present

## 2011-10-26 DIAGNOSIS — D649 Anemia, unspecified: Secondary | ICD-10-CM | POA: Diagnosis present

## 2011-10-26 DIAGNOSIS — J449 Chronic obstructive pulmonary disease, unspecified: Secondary | ICD-10-CM | POA: Diagnosis present

## 2011-10-26 DIAGNOSIS — R296 Repeated falls: Secondary | ICD-10-CM

## 2011-10-26 DIAGNOSIS — J4489 Other specified chronic obstructive pulmonary disease: Secondary | ICD-10-CM | POA: Diagnosis present

## 2011-10-26 DIAGNOSIS — K219 Gastro-esophageal reflux disease without esophagitis: Secondary | ICD-10-CM | POA: Diagnosis present

## 2011-10-26 DIAGNOSIS — E119 Type 2 diabetes mellitus without complications: Secondary | ICD-10-CM | POA: Diagnosis present

## 2011-10-26 DIAGNOSIS — G2401 Drug induced subacute dyskinesia: Secondary | ICD-10-CM | POA: Diagnosis present

## 2011-10-26 DIAGNOSIS — I2510 Atherosclerotic heart disease of native coronary artery without angina pectoris: Secondary | ICD-10-CM

## 2011-10-26 DIAGNOSIS — R5381 Other malaise: Secondary | ICD-10-CM | POA: Diagnosis present

## 2011-10-26 DIAGNOSIS — E7800 Pure hypercholesterolemia, unspecified: Secondary | ICD-10-CM | POA: Insufficient documentation

## 2011-10-26 DIAGNOSIS — E1165 Type 2 diabetes mellitus with hyperglycemia: Secondary | ICD-10-CM

## 2011-10-26 DIAGNOSIS — Z9861 Coronary angioplasty status: Secondary | ICD-10-CM

## 2011-10-26 DIAGNOSIS — F411 Generalized anxiety disorder: Secondary | ICD-10-CM | POA: Diagnosis present

## 2011-10-26 DIAGNOSIS — K3184 Gastroparesis: Secondary | ICD-10-CM | POA: Diagnosis present

## 2011-10-26 DIAGNOSIS — F3289 Other specified depressive episodes: Secondary | ICD-10-CM | POA: Diagnosis present

## 2011-10-26 DIAGNOSIS — L8990 Pressure ulcer of unspecified site, unspecified stage: Secondary | ICD-10-CM | POA: Diagnosis present

## 2011-10-26 DIAGNOSIS — I872 Venous insufficiency (chronic) (peripheral): Secondary | ICD-10-CM | POA: Diagnosis present

## 2011-10-26 DIAGNOSIS — G609 Hereditary and idiopathic neuropathy, unspecified: Secondary | ICD-10-CM | POA: Diagnosis present

## 2011-10-26 DIAGNOSIS — Z7409 Other reduced mobility: Secondary | ICD-10-CM | POA: Insufficient documentation

## 2011-10-26 DIAGNOSIS — Z86718 Personal history of other venous thrombosis and embolism: Secondary | ICD-10-CM

## 2011-10-26 HISTORY — DX: Unspecified osteoarthritis, unspecified site: M19.90

## 2011-10-26 LAB — COMPREHENSIVE METABOLIC PANEL
Alkaline Phosphatase: 68 U/L (ref 39–117)
BUN: 29 mg/dL — ABNORMAL HIGH (ref 6–23)
CO2: 33 mEq/L — ABNORMAL HIGH (ref 19–32)
Calcium: 9 mg/dL (ref 8.4–10.5)
GFR calc Af Amer: 52 mL/min — ABNORMAL LOW (ref >90)
GFR calc non Af Amer: 44 mL/min — ABNORMAL LOW (ref >90)
Glucose, Bld: 130 mg/dL — ABNORMAL HIGH (ref 70–99)
Potassium: 4.9 mEq/L (ref 3.5–5.1)
Total Protein: 7.1 g/dL (ref 6.0–8.3)

## 2011-10-26 LAB — URINALYSIS, ROUTINE W REFLEX MICROSCOPIC
Bilirubin Urine: NEGATIVE
Glucose, UA: NEGATIVE mg/dL
Ketones, ur: NEGATIVE mg/dL
Nitrite: POSITIVE — AB
pH: 5 (ref 5.0–8.0)

## 2011-10-26 LAB — URINE CULTURE
Colony Count: >=100000
Culture  Setup Time: 201301130252

## 2011-10-26 LAB — CBC
HCT: 27.7 % — ABNORMAL LOW (ref 36.0–46.0)
Hemoglobin: 8.3 g/dL — ABNORMAL LOW (ref 12.0–15.0)
MCH: 24 pg — ABNORMAL LOW (ref 26.0–34.0)
MCHC: 30 g/dL (ref 30.0–36.0)

## 2011-10-26 LAB — CARDIAC PANEL(CRET KIN+CKTOT+MB+TROPI)
Relative Index: INVALID (ref 0.0–2.5)
Total CK: 19 U/L (ref 7–177)

## 2011-10-26 LAB — URINE MICROSCOPIC-ADD ON

## 2011-10-26 MED ORDER — CIPROFLOXACIN IN D5W 400 MG/200ML IV SOLN
400.0000 mg | Freq: Once | INTRAVENOUS | Status: AC
  Administered 2011-10-26: 400 mg via INTRAVENOUS
  Filled 2011-10-26: qty 200

## 2011-10-26 MED ORDER — CIPROFLOXACIN HCL 500 MG PO TABS
500.0000 mg | ORAL_TABLET | Freq: Two times a day (BID) | ORAL | Status: DC
  Administered 2011-10-27 – 2011-10-29 (×5): 500 mg via ORAL
  Filled 2011-10-26 (×7): qty 1

## 2011-10-26 MED ORDER — BENZTROPINE MESYLATE 1 MG/ML IJ SOLN
1.0000 mg | Freq: Once | INTRAMUSCULAR | Status: DC
  Filled 2011-10-26: qty 1

## 2011-10-26 MED ORDER — BENZTROPINE MESYLATE 1 MG/ML IJ SOLN
1.0000 mg | Freq: Once | INTRAMUSCULAR | Status: AC
  Administered 2011-10-26: 1 mg via INTRAMUSCULAR
  Filled 2011-10-26: qty 1

## 2011-10-26 NOTE — ED Provider Notes (Signed)
History     CSN: [id number]  Arrival date & time 10/26/11  1704   First MD Initiated Contact with Patient 10/26/11 1723      Chief Complaint  Patient presents with  . Fall  Pt lives with son and daughter-in-law and has had more frequent falls lately. Last fell today while trying to transfer from wheelchair. No significant trauma. No head injury, LOC or neck pain. Pt has no specific complaints of pain but states she has been increasingly shaky. Recently started back on ativan and reglan help by PCP.  (Consider location/radiation/quality/duration/timing/severity/associated sxs/prior treatment) Patient is a 71 y.o. female presenting with fall. The history is provided by the patient and medical records.  Fall Pertinent negatives include no fever, no numbness, no abdominal pain, no nausea, no vomiting and no headaches.    Past Medical History  Diagnosis Date  . PE (pulmonary embolism)   . DVT (deep venous thrombosis)   . Diabetes mellitus   . Asthma   . Hypertension   . CAD (coronary artery disease)     s/p pci rca in past; nstemi 12/2009 with rca occlusion/isr - 2.75 x 18-mm PROMUS DES placed.  residual ostial lcx dzs  . GERD (gastroesophageal reflux disease)   . COPD (chronic obstructive pulmonary disease)   . Obesity   . Tardive dyskinesia   . History of renal failure   . Depression   . Borderline personality disorder   . Anxiety   . Anemia     hx of blood transfusion x 3  . Arthritis     Past Surgical History  Procedure Date  . US echocardiography 2008  . Orif ankle fracture   . Appendectomy   . Cervical discectomy   . Ptca 06/2010  . Tonsillectomy   . Transthoracic echocardiogram 06/2010    Grade 1 diastolic, EF 55-60    Family History  Problem Relation Age of Onset  . Breast cancer      aunt  . Alcohol abuse Son   . Cancer Daughter   . Leukemia Father     deceased  . Alzheimer's disease Mother     deceased    History  Substance Use Topics  . Smoking  status: Passive Smoker  . Smokeless tobacco: Never Used   Comment: never smoked - children smoke - lives with son.  . Alcohol Use: No    OB History    Grav Para Term Preterm Abortions TAB SAB Ect Mult Living                  Review of Systems  Constitutional: Negative for fever and chills.  HENT: Negative for facial swelling, neck pain and neck stiffness.   Respiratory: Negative for chest tightness, shortness of breath and wheezing.   Cardiovascular: Negative for chest pain, palpitations and leg swelling.  Gastrointestinal: Negative for nausea, vomiting, abdominal pain and diarrhea.  Genitourinary: Negative for dysuria and flank pain.  Skin: Negative for pallor and rash.  Neurological: Negative for dizziness, light-headedness, numbness and headaches.    Allergies  Ibuprofen; Amlodipine besylate; Amoxicillin; Codeine; Cortisone; Haloperidol lactate; Promethazine; Reglan; and Naproxen  Home Medications   Current Outpatient Rx  Name Route Sig Dispense Refill  . ALBUTEROL SULFATE (2.5 MG/3ML) 0.083% IN NEBU Nebulization Take 2.5 mg by nebulization every 6 (six) hours as needed. For wheezing    . ASPIRIN 81 MG PO TBEC Oral Take 81 mg by mouth daily.      Marland Kitchen BISOPROLOL FUMARATE 5 MG  PO TABS Oral Take 5 mg by mouth daily.      Marland Kitchen CLOPIDOGREL BISULFATE 75 MG PO TABS Oral Take 75 mg by mouth daily.      Marland Kitchen DOCUSATE SODIUM 100 MG PO CAPS Oral Take 1 capsule (100 mg total) by mouth 2 (two) times daily. 100 capsule 1  . FERROUS SULFATE 325 (65 FE) MG PO TABS Oral Take 1 tablet (325 mg total) by mouth daily. 90 tablet 2  . INSULIN ASPART 100 UNIT/ML [place] SOLN Subcutaneous Inject 16-24 Units into the skin 3 (three) times daily before meals. 16 units prior to breakfast, 20 units prior to lunch, 24 prior units to dinner    . INSULIN GLARGINE 100 UNIT/ML [place] SOLN Subcutaneous Inject 55 Units into the skin daily.      Marland Kitchen LISINOPRIL 5 MG PO TABS Oral Take 1 tablet (5 mg total) by mouth daily. 90 tablet 0   . LORAZEPAM 1 MG PO TABS Oral Take 1 tablet (1 mg total) by mouth 3 (three) times daily as needed for anxiety. 15 tablet 0  . METOCLOPRAMIDE HCL 5 MG PO TABS Oral Take 5 mg by mouth 4 (four) times daily. Patients states she takes reglan per patient.    . MORPHINE SULFATE ER 15 MG PO TB12 Oral Take 1 tablet (15 mg total) by mouth 3 (three) times daily. 90 tablet 0  . SENIOR MULTIVITAMIN PLUS PO TABS Oral Take 1 tablet by mouth daily. 100 tablet 1  . NITROGLYCERIN 0.4 MG SL SUBL Sublingual Place 0.4 mg under the tongue every 5 (five) minutes as needed. For chest pain    . NYSTATIN 100000 UNIT/GM EX POWD Topical Apply 1 Units topically 2 (two) times daily.     . NYSTATIN 100000 UNIT/GM EX CREA Topical Apply 1 application topically 2 (two) times daily.      Marland Kitchen OMEPRAZOLE 40 MG PO CPDR Oral Take 40 mg by mouth daily.      Marland Kitchen ROSUVASTATIN CALCIUM 40 MG PO TABS Oral Take 40 mg by mouth daily.      . SENNOSIDES 8.6 MG PO TABS Oral Take 3 tablets by mouth daily.      . SUCRALFATE 1 G PO TABS Oral Take 1 g by mouth 3 (three) times daily before meals.      . TORSEMIDE 20 MG PO TABS Oral Take 20 mg by mouth daily.        BP 117/38  Pulse 54  Temp(Src) 97.2 F (36.2 C) (Oral)  Resp 15  SpO2 100%  Physical Exam  Constitutional: She is oriented to person, place, and time. She appears well-developed and well-nourished. No distress.       Watching television, no distress  HENT:  Head: Normocephalic and atraumatic.  Eyes: Conjunctivae and EOM are normal. Pupils are equal, round, and reactive to light.  Neck: Normal range of motion. Neck supple.       No posterior cervical ttp. No evidence of trauma  Cardiovascular: Normal rate and regular rhythm.   Pulmonary/Chest: Effort normal and breath sounds normal. No respiratory distress. She has no rales. She exhibits no tenderness.  Abdominal: Soft. Bowel sounds are normal. She exhibits no mass. There is no tenderness. There is no rebound and no guarding.    Musculoskeletal:       Scattered bruises at different stages of healing  Neurological: She is alert and oriented to person, place, and time.       Moves all 4 ext. Tremor noted in  all ext and tongue writhing characteristic of dystonic reaction  Skin: Skin is warm and dry.    ED Course  Procedures (including critical care time)  Labs Reviewed  CBC - Abnormal; Notable for the following:    RBC 3.46 (*)    Hemoglobin 8.3 (*)    HCT 27.7 (*)    MCH 24.0 (*)    All other components within normal limits  COMPREHENSIVE METABOLIC PANEL - Abnormal; Notable for the following:    Sodium 134 (*)    Chloride 95 (*)    CO2 33 (*)    Glucose, Bld 130 (*)    BUN 29 (*)    Creatinine, Ser 1.19 (*)    Albumin 2.7 (*)    Total Bilirubin 0.2 (*)    GFR calc non Af Amer 44 (*)    GFR calc Af Amer 52 (*)    All other components within normal limits  URINALYSIS, ROUTINE W REFLEX MICROSCOPIC - Abnormal; Notable for the following:    Hgb urine dipstick TRACE (*)    Nitrite POSITIVE (*)    Leukocytes, UA TRACE (*)    All other components within normal limits  URINE MICROSCOPIC-ADD ON - Abnormal; Notable for the following:    Bacteria, UA MANY (*)    All other components within normal limits  CARDIAC PANEL(CRET KIN+CKTOT+MB+TROPI)  URINE CULTURE   Dg Chest Portable 1 View  10/26/2011  *RADIOLOGY REPORT*  Clinical Data: Follow out of bed.  Chest pain and shortness of breath.  PORTABLE CHEST - 1 VIEW  Comparison: 09/19/2011  Findings: Numerous leads and wires project over the chest.  Mildly degraded exam due to AP portable technique and patient body habitus.  Cardiomegaly accentuated by AP portable technique.  No right and no definite left pleural effusion. No pneumothorax.  Low lung volumes with resultant pulmonary interstitial prominence.  No overt congestive failure.  Left lower lobe airspace disease.  IMPRESSION:  1. Decreased sensitivity and specificity exam due to technique related factors, as  described above. 2.  Cardiomegaly and low lung volumes. 3.  Left base airspace disease, likely atelectasis.  Original Report Authenticated By: Consuello Bossier, M.D.     1. UTI (lower urinary tract infection)   2. Falls frequently       MDM  Admit to fam med.         Loren Racer, MD 10/26/11 2127

## 2011-10-26 NOTE — ED Notes (Signed)
MD at bedside to discuss plan of care with family.  Family states they can no longer care for patient at home and wish for placement.  MD to page internal medicine for admit.
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
## 2011-10-26 NOTE — ED Notes (Signed)
Family took personal belongings home leaving purse with patient .

## 2011-10-26 NOTE — ED Notes (Signed)
Pt from home via EMS related to fall today. Ems states family stated they wanted pt evaluated for placement related to multiple falls.Pt with generalized complaints of pain. No obvious new trauma. L foot with post op shoe related to previous fall.

## 2011-10-26 NOTE — ED Notes (Signed)
Pt presents to department via EMS from home for evaluation of fall. Pt was attempting to get out of wheelchair at home when she fell onto floor. Per family patient has been falling frequently and is also have tremors. Family doesn't feel she is safe to live alone. Pt is alert and oriented x4. Previous injury to L foot from fall, but no new injuries today. No signs of distress at the time. CBG 141

## 2011-10-26 NOTE — ED Notes (Signed)
Left foot with moderate swelling to anterior aspect of foot and to great toe. Betadine noted with flaking and cracking noted to posterior portion of the great toe and 5th digit

## 2011-10-26 NOTE — ED Notes (Signed)
Patient is resting comfortably. 

## 2011-10-27 DIAGNOSIS — R269 Unspecified abnormalities of gait and mobility: Secondary | ICD-10-CM

## 2011-10-27 DIAGNOSIS — R5381 Other malaise: Secondary | ICD-10-CM

## 2011-10-27 DIAGNOSIS — E118 Type 2 diabetes mellitus with unspecified complications: Secondary | ICD-10-CM

## 2011-10-27 DIAGNOSIS — F609 Personality disorder, unspecified: Secondary | ICD-10-CM

## 2011-10-27 DIAGNOSIS — R5383 Other fatigue: Secondary | ICD-10-CM

## 2011-10-27 LAB — GLUCOSE, CAPILLARY
Glucose-Capillary: 295 mg/dL — ABNORMAL HIGH (ref 70–99)
Glucose-Capillary: 319 mg/dL — ABNORMAL HIGH (ref 70–99)

## 2011-10-27 MED ORDER — ROSUVASTATIN CALCIUM 40 MG PO TABS
40.0000 mg | ORAL_TABLET | Freq: Every day | ORAL | Status: DC
  Administered 2011-10-27 – 2011-10-28 (×2): 40 mg via ORAL
  Filled 2011-10-27 (×3): qty 1

## 2011-10-27 MED ORDER — HEPARIN SODIUM (PORCINE) 5000 UNIT/ML IJ SOLN
5000.0000 [IU] | Freq: Three times a day (TID) | INTRAMUSCULAR | Status: DC
  Administered 2011-10-27 – 2011-10-29 (×6): 5000 [IU] via SUBCUTANEOUS
  Filled 2011-10-27 (×10): qty 1

## 2011-10-27 MED ORDER — NYSTATIN 100000 UNIT/GM EX POWD: 1.0000 [IU] | Freq: Two times a day (BID) | CUTANEOUS | Status: DC

## 2011-10-27 MED ORDER — ASPIRIN EC 81 MG PO TBEC
81.0000 mg | DELAYED_RELEASE_TABLET | Freq: Every day | ORAL | Status: DC
  Administered 2011-10-27 – 2011-10-29 (×3): 81 mg via ORAL
  Filled 2011-10-27 (×3): qty 1

## 2011-10-27 MED ORDER — DOCUSATE SODIUM 100 MG PO CAPS
100.0000 mg | ORAL_CAPSULE | Freq: Two times a day (BID) | ORAL | Status: DC
  Administered 2011-10-27 – 2011-10-29 (×4): 100 mg via ORAL
  Filled 2011-10-27 (×4): qty 1

## 2011-10-27 MED ORDER — LISINOPRIL 5 MG PO TABS
5.0000 mg | ORAL_TABLET | Freq: Every day | ORAL | Status: DC
  Administered 2011-10-27 – 2011-10-29 (×3): 5 mg via ORAL
  Filled 2011-10-27 (×3): qty 1

## 2011-10-27 MED ORDER — INSULIN GLARGINE 100 UNIT/ML ~~LOC~~ SOLN
30.0000 [IU] | Freq: Every day | SUBCUTANEOUS | Status: DC
  Filled 2011-10-27: qty 3

## 2011-10-27 MED ORDER — CLOPIDOGREL BISULFATE 75 MG PO TABS
75.0000 mg | ORAL_TABLET | Freq: Every day | ORAL | Status: DC
  Administered 2011-10-27 – 2011-10-29 (×3): 75 mg via ORAL
  Filled 2011-10-27 (×3): qty 1

## 2011-10-27 MED ORDER — SENIOR MULTIVITAMIN PLUS PO TABS: 1.0000 | ORAL_TABLET | Freq: Every day | ORAL | Status: DC

## 2011-10-27 MED ORDER — PANTOPRAZOLE SODIUM 40 MG PO TBEC
40.0000 mg | DELAYED_RELEASE_TABLET | Freq: Every day | ORAL | Status: DC
  Administered 2011-10-27 – 2011-10-29 (×3): 40 mg via ORAL
  Filled 2011-10-27 (×3): qty 1

## 2011-10-27 MED ORDER — NITROGLYCERIN 0.4 MG SL SUBL: 0.4000 mg | SUBLINGUAL_TABLET | SUBLINGUAL | Status: DC | PRN

## 2011-10-27 MED ORDER — SENNA 8.6 MG PO TABS
3.0000 | ORAL_TABLET | Freq: Every day | ORAL | Status: DC
  Administered 2011-10-28 – 2011-10-29 (×2): 25.8 mg via ORAL
  Filled 2011-10-27: qty 2
  Filled 2011-10-27: qty 3
  Filled 2011-10-27: qty 1
  Filled 2011-10-27 (×2): qty 3

## 2011-10-27 MED ORDER — SUCRALFATE 1 G PO TABS
1.0000 g | ORAL_TABLET | Freq: Three times a day (TID) | ORAL | Status: DC
  Administered 2011-10-27 – 2011-10-29 (×8): 1 g via ORAL
  Filled 2011-10-27 (×10): qty 1

## 2011-10-27 MED ORDER — INSULIN GLARGINE 100 UNIT/ML ~~LOC~~ SOLN
40.0000 [IU] | Freq: Every day | SUBCUTANEOUS | Status: DC
  Administered 2011-10-27: 40 [IU] via SUBCUTANEOUS
  Filled 2011-10-27: qty 3

## 2011-10-27 MED ORDER — LORAZEPAM 0.5 MG PO TABS
0.5000 mg | ORAL_TABLET | Freq: Three times a day (TID) | ORAL | Status: DC | PRN
  Administered 2011-10-27 – 2011-10-29 (×2): 0.5 mg via ORAL
  Filled 2011-10-27 (×2): qty 1

## 2011-10-27 MED ORDER — MORPHINE SULFATE CR 15 MG PO TB12
15.0000 mg | ORAL_TABLET | Freq: Three times a day (TID) | ORAL | Status: DC
  Administered 2011-10-27 – 2011-10-29 (×7): 15 mg via ORAL
  Filled 2011-10-27 (×7): qty 1

## 2011-10-27 MED ORDER — NYSTATIN 100000 UNIT/GM EX POWD
Freq: Two times a day (BID) | CUTANEOUS | Status: DC
  Administered 2011-10-27 – 2011-10-28 (×4): via TOPICAL
  Filled 2011-10-27 (×3): qty 15

## 2011-10-27 MED ORDER — FERROUS SULFATE 325 (65 FE) MG PO TABS
325.0000 mg | ORAL_TABLET | Freq: Every day | ORAL | Status: DC
  Administered 2011-10-27 – 2011-10-29 (×3): 325 mg via ORAL
  Filled 2011-10-27 (×3): qty 1

## 2011-10-27 MED ORDER — BENZTROPINE MESYLATE 0.5 MG PO TABS
0.5000 mg | ORAL_TABLET | Freq: Two times a day (BID) | ORAL | Status: DC | PRN
  Administered 2011-10-27: 0.5 mg via ORAL
  Filled 2011-10-27: qty 1

## 2011-10-27 MED ORDER — ADULT MULTIVITAMIN W/MINERALS CH
1.0000 | ORAL_TABLET | Freq: Every day | ORAL | Status: DC
  Administered 2011-10-27 – 2011-10-29 (×3): 1 via ORAL
  Filled 2011-10-27 (×3): qty 1

## 2011-10-27 MED ORDER — INSULIN ASPART 100 UNIT/ML ~~LOC~~ SOLN
0.0000 [IU] | Freq: Three times a day (TID) | SUBCUTANEOUS | Status: DC
  Administered 2011-10-27 (×2): 8 [IU] via SUBCUTANEOUS
  Administered 2011-10-27: 3 [IU] via SUBCUTANEOUS
  Administered 2011-10-28 (×2): 8 [IU] via SUBCUTANEOUS
  Administered 2011-10-29 (×2): 3 [IU] via SUBCUTANEOUS
  Filled 2011-10-27: qty 3

## 2011-10-27 MED ORDER — ALBUTEROL SULFATE (5 MG/ML) 0.5% IN NEBU: 2.5000 mg | INHALATION_SOLUTION | Freq: Four times a day (QID) | RESPIRATORY_TRACT | Status: DC | PRN

## 2011-10-27 MED ORDER — TORSEMIDE 20 MG PO TABS
20.0000 mg | ORAL_TABLET | Freq: Every day | ORAL | Status: DC
Start: 2011-10-27 — End: 2011-10-29
  Administered 2011-10-27 – 2011-10-29 (×3): 20 mg via ORAL
  Filled 2011-10-27 (×3): qty 1

## 2011-10-27 NOTE — Progress Notes (Signed)
Physical Therapy Evaluation Patient Details Name: Holly Kim MRN: [id number] DOB: [birth year] Today's Date: 10/27/2011  Problem List:  Patient Active Problem List  Diagnoses  . DIABETES MELLITUS, II, COMPLICATIONS  . HYPERCHOLESTEROLEMIA  . Morbid obesity  . ANEMIA, NORMOCYTIC  . ANXIETY  . BORDERLINE PERSONALITY  . DEPRESSIVE DISORDER, NOS  . TARDIVE DYSKINESIA  . NEUROPATHY, PERIPHERAL  . CAD  . DEEP VEIN THROMBOPHLEBITIS, LEG  . VENOUS INSUFFICIENCY  . CHRONIC AIRWAY OBSTRUCTION NEC  . GERD  . CONSTIPATION  . BACK PAIN, CHRONIC  . CEREBROVAS DIS, LATE EFFECTS (S/P CVA)  . FATIGUE  . CHEST PAIN, ATYPICAL  . Abdominal pain, right upper quadrant  . ADULT MALTREATMENT UNSPECIFIED NEC  . Encounter for long-term (current) use of high-risk medication  . Abdominal pain  . Blindness - both eyes  . Impaired mobility  . Diastolic dysfunction  . Encounter for palliative care  . Dyspnea  . Restrictive lung disease  . Housing or economic circumstance  . Skin tag of female perineum  . Unstable angina    Past Medical History:  Past Medical History  Diagnosis Date  . PE (pulmonary embolism)   . DVT (deep venous thrombosis)   . Diabetes mellitus   . Asthma   . Hypertension   . CAD (coronary artery disease)     s/p pci rca in past; nstemi 12/2009 with rca occlusion/isr - 2.75 x 18-mm PROMUS DES placed.  residual ostial lcx dzs  . GERD (gastroesophageal reflux disease)   . COPD (chronic obstructive pulmonary disease)   . Obesity   . Tardive dyskinesia   . History of renal failure   . Depression   . Borderline personality disorder   . Anxiety   . Anemia     hx of blood transfusion x 3  . Arthritis    Past Surgical History:  Past Surgical History  Procedure Date  . US echocardiography 2008  . Orif ankle fracture   . Appendectomy   . Cervical discectomy   . Ptca 06/2010  . Tonsillectomy   . Transthoracic echocardiogram 06/2010    Grade 1 diastolic, EF 55-60     PT Assessment/Plan/Recommendation PT Assessment Clinical Impression Statement: Morbidly obese patient with significantly limited functional mobility due to multiple medical conditions and prolonged inactivity presents with severe deconditioning and will require long term post acute rehab prior to attempting to return home with son PT Recommendation/Assessment: All further PT needs can be met in the next venue of care PT Problem List: Decreased strength;Decreased range of motion;Decreased activity tolerance;Decreased mobility;Cardiopulmonary status limiting activity;Obesity;Pain PT Therapy Diagnosis : Generalized weakness PT Recommendation Follow Up Recommendations: Skilled nursing facility Equipment Recommended: Defer to next venue PT Goals  Acute Rehab PT Goals PT Goal Formulation: With patient Time For Goal Achievement: Other (comment) (no acute care goals for this episode)  PT Evaluation Precautions/Restrictions  Precautions Precautions: Fall Required Braces or Orthoses: No Restrictions Weight Bearing Restrictions: No Prior Functioning  Home Living Lives With: Sheran Spine Help From: Family Type of Home: House Prior Function Level of Independence: Needs assistance with ADLs;Needs assistance with homemaking;Needs assistance with gait;Needs assistance with tranfers Able to Take Stairs?: No Driving: No Cognition Cognition Arousal/Alertness: Lethargic Overall Cognitive Status: Appears within functional limits for tasks assessed Orientation Level: Oriented X4 Sensation/Coordination Coordination Gross Motor Movements are Fluid and Coordinated: No Extremity Assessment RUE Assessment RUE Assessment: Exceptions to [facility] RUE Strength RUE Overall Strength: Deficits;Due to pain;Due to premorbid status LUE Assessment LUE Assessment: Exceptions  to [facility] LUE Strength LUE Overall Strength: Deficits;Due to pain;Due to premorbid status RLE Assessment RLE Assessment: Exceptions to  [facility] RLE Strength RLE Overall Strength: Deficits;Due to pain;Due to premorbid status LLE Assessment LLE Assessment: Exceptions to [facility] LLE Strength LLE Overall Strength: Deficits;Due to pain;Due to premorbid status Mobility (including Balance) Bed Mobility Bed Mobility: Yes Rolling Right: 2: Max assist;With rail Rolling Right Details (indicate cue type and reason): assist to initiate trunk rotation and to reach rail to pull self over'; faciliation via bed pad Rolling Left: 2: Max assist;With rail Rolling Left Details (indicate cue type and reason): as above Transfers Transfers: No Ambulation/Gait Ambulation/Gait: No Stairs: No Wheelchair Mobility Wheelchair Mobility: No  Posture/Postural Control Posture/Postural Control:  (unable to assess) Balance Balance Assessed: No Exercise    End of Session PT - End of Session Activity Tolerance: Patient limited by fatigue Patient left: in bed;with call bell in reach Nurse Communication: Mobility status for transfers;Mobility status for ambulation;Need for lift equipment General Behavior During Session: Flat affect Cognition: WFL for tasks performed  Dennis Bast 10/27/2011, 3:58 PM

## 2011-10-27 NOTE — H&P (Signed)
I interviewed and examined this patient and discussed the care plan with Dr. Katrinka Blazing and the [facility] team and agree with assessment and plan as documented in the admission note for today. She has no cogwheel rigidity to indicate another EPS, and the TD is chronic and not harmful to the patient. Her myoclonic jerks could be due to chronic hypercarbia, so consider ABG if they are significant. They will be reduced by benzodiazepines, but their use must be balanced with fall risk, though she only does one step pivot transfers. She wants to return to [facility], but her Medicare/Medicaid eligibility for long term care will have to be determined. Check Vitamin D if not already done.     Laine Giovanetti A. Sheffield Slider, MD Family Medicine Teaching Service Attending  10/27/2011 11:08 AM

## 2011-10-27 NOTE — Progress Notes (Signed)
CSW received consult for SNF. Pt reports recent STR stay at [facility] was positive experience and she would like to return. Awaiting PT/OT eval, but anticipate need for SNF. Will continue to follow for support and SNF placement.  Dellie Burns, MSW, [place] [phone] (weekend)

## 2011-10-27 NOTE — Progress Notes (Signed)
Family Medicine- PGY-3 Holly Gaulin S. Lane Kjos, MD  Subjective: Pt states she continues to have nausea.  Continued weakness.  Objective: Vital signs in last 24 hours: Temp:  [97.2 F (36.2 C)-98.4 F (36.9 C)] 97.4 F (36.3 C) (01/13 0546) Pulse Rate:  [54-60] 56  (01/13 0546) Resp:  [13-20] 20  (01/13 0546) BP: (105-125)/(35-112) 105/43 mmHg (01/13 0546) SpO2:  [100 %] 100 % (01/13 0546) Weight:  [260 lb 4.8 oz (118.071 kg)] 260 lb 4.8 oz (118.071 kg) (01/13 0008) Weight change:  Last BM Date: 10/25/10   General: alert, cooperative, no distress, morbidly obese  HEENT: extra ocular movement intact, sclera clear Heart: RRR. No murmurs, rubs or gallops.  Lungs: clear to auscultation, no wheezes or rales.  Abdomen: difuse tenderness on abd exam.  No rebound, no guarding. soft.  Extremities: edema bilateral 1+, significant chronic changes to feet bilaterally  Neurology: Oriented X 3. No focal motor deficits. Has some involuntary myoclonic jerks mostly in the upper extremities as well as facial features. Buccal/lingual tardive dyskinesia.     Lab Results:  Holly Kim 10/26/11 1829  WBC 7.6  HGB 8.3*  HCT 27.7*  PLT 172   BMET  Basename 10/26/11 1829  NA 134*  K 4.9  CL 95*  CO2 33*  GLUCOSE 130*  BUN 29*  CREATININE 1.19*  CALCIUM 9.0    Studies/Results: Dg Chest Portable 1 View  10/26/2011  *RADIOLOGY REPORT*  Clinical Data: Follow out of bed.  Chest pain and shortness of breath.  PORTABLE CHEST - 1 VIEW  Comparison: 09/19/2011  Findings: Numerous leads and wires project over the chest.  Mildly degraded exam due to AP portable technique and patient body habitus.  Cardiomegaly accentuated by AP portable technique.  No right and no definite left pleural effusion. No pneumothorax.  Low lung volumes with resultant pulmonary interstitial prominence.  No overt congestive failure.  Left lower lobe airspace disease.  IMPRESSION:  1. Decreased sensitivity and specificity exam due to  technique related factors, as described above. 2.  Cardiomegaly and low lung volumes. 3.  Left base airspace disease, likely atelectasis.  Original Report Authenticated By: Holly Kim, M.D.    Medications: I have reviewed the patient's current medications.  Assessment/Plan: Holly Kim is a 71 y.o. year old female presenting with generalized weakness and failure to thrive at home  Generalized weakness/ failure to thrive: Most likely due to deconditioning and chronic comorbidities patient does have signs of UTI. Patient does not seems to be doing good in a home setting and likely will need placement will place on MedSurg floor. PT /OT consult. Social work for potential placement.   1. UTI: Positive UA - Patient has history of Klebsiella on last hospitalization. This was sensitive to Cipro we will continue Cipro for 7 day course. Day #1/ 7.    2. Anemia: Around her baseline. - Monitor with daily CBC, patient is asymptomatic   3. DM - On Lantus decreased to 35 units while inpatient due to her carb modified diet put on insulin sliding scale. We'll adjust accordingly.  Will monitor cbg's closely.   4. Hx of recent angioplasty: No chest pain, no syncope around the episode of fall, no palpitations.   5. FEN/GI: CHO modified diet.  6. Tardive  dyskinesia: Patient has had a history of this before likely secondary to her Reglan and Phenergan. We will discontinue these medications at this time and we will treat her muscle spasms with Ativan as well as Cogentin  as needed. 7. Prophylaxis: Heparin. Pantoprazole 8. Disposition: Pending pt evaluation by physical therapy and occupational therapy for placement patient did enjoy her time at last skilled nursing facility, would agree to admission again. 9. CODE STATUS: Patient is made a limited code her only CPR pressors in cardiogenic medications will be given. Patient is a DO NOT INTUBATE and does not want cardioversion.     LOS: 1 day    Holly Kim 10/27/2011, 10:56 AM

## 2011-10-27 NOTE — Progress Notes (Signed)
I interviewed and examined this patient and discussed the care plan with Dr. Edmonia James and the [facility] team and agree with assessment and plan as documented in the progress note for today. We'll continue Cipro unless she develops more systemic signs of UTI. Consider urinary retention secondary to medications if she continues recurrent UTI>     Zaim Nitta A. Sheffield Slider, MD Family Medicine Teaching Service Attending  10/27/2011 11:13 AM

## 2011-10-28 ENCOUNTER — Telehealth: Payer: Self-pay | Admitting: Family Medicine

## 2011-10-28 DIAGNOSIS — I2510 Atherosclerotic heart disease of native coronary artery without angina pectoris: Secondary | ICD-10-CM

## 2011-10-28 LAB — CBC
MCV: 80.1 fL (ref 78.0–100.0)
Platelets: 170 10*3/uL (ref 150–400)
RBC: 3.06 MIL/uL — ABNORMAL LOW (ref 3.87–5.11)
RDW: 15 % (ref 11.5–15.5)
WBC: 4.4 10*3/uL (ref 4.0–10.5)

## 2011-10-28 LAB — DIFFERENTIAL
Basophils Absolute: 0 10*3/uL (ref 0.0–0.1)
Eosinophils Absolute: 0 10*3/uL (ref 0.0–0.7)
Eosinophils Relative: 1 % (ref 0–5)
Lymphocytes Relative: 33 % (ref 12–46)
Neutrophils Relative %: 58 % (ref 43–77)

## 2011-10-28 LAB — BASIC METABOLIC PANEL
CO2: 36 mEq/L — ABNORMAL HIGH (ref 19–32)
Calcium: 8.6 mg/dL (ref 8.4–10.5)
GFR calc non Af Amer: 53 mL/min — ABNORMAL LOW (ref >90)
Potassium: 4.6 mEq/L (ref 3.5–5.1)
Sodium: 136 mEq/L (ref 135–145)

## 2011-10-28 LAB — VITAMIN D 25 HYDROXY (VIT D DEFICIENCY, FRACTURES): Vit D, 25-Hydroxy: 26 ng/mL — ABNORMAL LOW (ref 30–89)

## 2011-10-28 LAB — GLUCOSE, CAPILLARY

## 2011-10-28 MED ORDER — INSULIN GLARGINE 100 UNIT/ML ~~LOC~~ SOLN
55.0000 [IU] | Freq: Every day | SUBCUTANEOUS | Status: DC
  Administered 2011-10-28: 55 [IU] via SUBCUTANEOUS

## 2011-10-28 MED ORDER — PROMETHAZINE HCL 12.5 MG PO TABS
12.5000 mg | ORAL_TABLET | Freq: Four times a day (QID) | ORAL | Status: DC | PRN
Start: 2011-10-28 — End: 2011-10-29
  Administered 2011-10-28 – 2011-10-29 (×2): 12.5 mg via ORAL
  Filled 2011-10-28 (×4): qty 1

## 2011-10-28 MED ORDER — POLYETHYLENE GLYCOL 3350 17 G PO PACK
17.0000 g | PACK | Freq: Two times a day (BID) | ORAL | Status: DC
  Filled 2011-10-28 (×4): qty 1

## 2011-10-28 NOTE — Telephone Encounter (Signed)
Please call patient back to her hospital room .  The ext is [phone]

## 2011-10-28 NOTE — Progress Notes (Signed)
Clinical Child psychotherapist (CSW) confirmed that pt is from [place] and pt would like to return when medically ready. CSW confirmed that [facility] can accept pt at dc. CSW will assist with dc when pt medically ready.  [name], MSW, Theresia Majors [phone]

## 2011-10-28 NOTE — Progress Notes (Signed)
Family Medicine Teaching Service Daily Resident Note   Patient name: Holly Kim Medical record number: [id number] Date of birth: 1940-10-30 Age: 71 y.o. Gender: female Length of Stay:  LOS: 2 days             SUBJECTIVE  No new complaints today.  States he stomach does not hurt too bad but has not had a BM since last week.               OBJECTIVE  Vitals: Patient Vitals for the past 24 hrs:  BP Temp Temp src Pulse Resp SpO2  10/28/11 0514 117/48 mmHg 97.5 F (36.4 C) Oral 59  20  100 %  10/27/11 2135 124/39 mmHg 98.8 F (37.1 C) Oral 60  20  99 %  10/27/11 1348 128/48 mmHg 98 F (36.7 C) Oral 61  18  100 %   Wt Readings from Last 3 Encounters:  10/27/11 260 lb 4.8 oz (118.071 kg)  09/24/11 279 lb 15.8 oz (127 kg)  09/12/11 276 lb (125.193 kg)    Intake/Output Summary (Last 24 hours) at 10/28/11 0840 Last data filed at 10/28/11 0600  Gross per 24 hour  Intake   1060 ml  Output      0 ml  Net   1060 ml    PE: General: alert, cooperative, no distress, morbidly obese and slowed mentation  HEENT:AT,[place]. No scleral icterus, mmm, dystonic tongue movements Heart: RRR. No murmurs, rubs or gallops.  Lungs: clear to auscultation, no wheezes or rales.  Abdomen: positive BS, abdomen is soft, tenderness to mild palp but no focality.  Extremities: edema bilateral 1+ pulses bilaterally significant chronic changes to feet bilaterally  Neurology: Oriented X 3. No focal motor deficits. Has some involuntary myoclonic jerks mostly in the upper extremities as well as facial features. Patient appears to be close to nonambulatory  LABS: Hematology:  Lab 10/28/11 0500 10/26/11 1829  WBC 4.4 7.6  HGB 7.3* 8.3*  HCT 24.5* 27.7*  PLT 170 172  RDW 15.0 15.2  MCV 80.1 80.1  MCHC 29.8* 30.0   Metabolic:  Lab 10/28/11 0500 10/26/11 1829  NA 136 134*  K 4.6 4.9  CL 94* 95*  CO2 36* 33*  BUN 22 29*  CREATININE 1.03 1.19*  GLUCOSE 276* 130*  CALCIUM 8.6 9.0  MG -- --  PHOS  -- --    Lab 10/26/11 1829  ALKPHOS 68  BILITOT 0.2*  BILIDIR --  PROT 7.1  ALT 23  AST 19   Lab 10/28/11 0730 10/27/11 2131 10/27/11 1729 10/27/11 1155 10/27/11 0806 10/27/11 0005  GLUCAP 270* 319* 295* 263* 179* 232*   Other Labs:  Lab 10/26/11 1829  CKTOTAL 19  TROPONINI <0.30  TROPONINT --  CKMBINDEX --    MICRO 1/12 Urine Cx: >100,000 CFU gram neg rods  IMAGING: None new            ASSESSMENT & PLAN  Holly Kim is a 71 y.o. year old female presenting with generalized weakness and failure to thrive at home  Generalized weakness/ failure to thrive: Most likely due to deconditioning and chronic comorbidities patient does have signs of UTI. Patient does not seems to be doing good in a home setting and likely will need placement will place on MedSurg floor. PT /OT consult. Social work for potential placement.  1. UTI: Positive UA - Patient has history of Klebsiella on last hospitalization. This was sensitive to Cipro we will continue Cipro for 7  day course. Day #2/ 7.  2. Anemia: Around her baseline. - Monitor with daily CBC, patient is asymptomatic  3. DM - will increase Lantus to home dose *Lantus 45U  4. Hx of recent angioplasty: No chest pain, no syncope around the episode of fall, no palpitations.  5. Tardive dyskinesia: Patient has had a history of this before likely secondary to her Reglan and Phenergan. We will discontinue these medications at this time and we will treat her muscle spasms with Ativan as well as Cogentin as needed 6. Gastroparesis & Constipation:  Was on reglan but stoped due to tartive dyskinesia.  Will defer restarting at pts request.  Consider reduced dose and frequency if gastroparesis continues to be problematic.  Good BS today.  Will give MiraLax today but pt not wanting to be on it for long term.    FEN/GI: CHO modified diet. Marland Kitchen Prophylaxis: Heparin. Pantoprazole  CODE STATUS: Patient is made a limited code her only CPR pressors in  cardiogenic medications will be given. Patient is a DO NOT INTUBATE and does not want cardioversion.             DISPOSITION   Pending pt evaluation by physical therapy and occupational therapy for placement patient did enjoy her time at last skilled nursing facility; Plan to SNF tomorrow 1/15.    Gaspar Bidding, DO [facility] Family [facility] Resident - PGY-1 10/28/2011 8:40 AM

## 2011-10-28 NOTE — Progress Notes (Signed)
Inpatient Diabetes Program Recommendations  AACE/ADA: New Consensus Statement on Inpatient Glycemic Control (2009)  Target Ranges:  Prepandial:   less than 140 mg/dL      Peak postprandial:   less than 180 mg/dL (1-2 hours)      Critically ill patients:  140 - 180 mg/dL   Reason for Visit: Hyperlgycemia  Inpatient Diabetes Program Recommendations Insulin - Basal: Please increase Lantus to 50 units (Home dose is 55 units Insulin - Meal Coverage: Pt takes meal coverage at home: 16 units ac breakfast, 20 units ac lunch, 24 units ac supper Please add 4 units (to start) Novolog meal coverage [id number]  Note: Thank you, Lenor Coffin, RN, CNS [phone])

## 2011-10-28 NOTE — Progress Notes (Signed)
I have seen and examined this patient. I have discussed with Dr Berline Chough.  I agree with their findings and plans as documented in their progress note for today.  Acute Issues 1. Nausea, acute exacerbation on chronic nausea (gastroparesis) - Pt unable to eat meals because of nausea.  - Will restart patient's phenergan which was stopped because of possible EPS symptoms (myoclonic-like jerks) per patient.  Will monitor for symptom control, improvement in appetite and for relapse of myoclonic-like jerk movements with restart of phenergan at low dose.

## 2011-10-29 DIAGNOSIS — N390 Urinary tract infection, site not specified: Secondary | ICD-10-CM

## 2011-10-29 LAB — DIFFERENTIAL
Eosinophils Relative: 1 % (ref 0–5)
Lymphocytes Relative: 32 % (ref 12–46)
Lymphs Abs: 1.8 10*3/uL (ref 0.7–4.0)
Monocytes Absolute: 0.4 10*3/uL (ref 0.1–1.0)
Monocytes Relative: 7 % (ref 3–12)

## 2011-10-29 LAB — BASIC METABOLIC PANEL
BUN: 18 mg/dL (ref 6–23)
CO2: 38 mEq/L — ABNORMAL HIGH (ref 19–32)
Calcium: 9.2 mg/dL (ref 8.4–10.5)
Creatinine, Ser: 0.97 mg/dL (ref 0.50–1.10)
Glucose, Bld: 186 mg/dL — ABNORMAL HIGH (ref 70–99)
Sodium: 139 mEq/L (ref 135–145)

## 2011-10-29 LAB — GLUCOSE, CAPILLARY

## 2011-10-29 LAB — CBC
HCT: 25.8 % — ABNORMAL LOW (ref 36.0–46.0)
Hemoglobin: 7.7 g/dL — ABNORMAL LOW (ref 12.0–15.0)
MCV: 79.4 fL (ref 78.0–100.0)
RBC: 3.25 MIL/uL — ABNORMAL LOW (ref 3.87–5.11)
WBC: 5.7 10*3/uL (ref 4.0–10.5)

## 2011-10-29 MED ORDER — PROMETHAZINE HCL 12.5 MG PO TABS: 12.5000 mg | ORAL_TABLET | Freq: Four times a day (QID) | ORAL | Status: DC | PRN

## 2011-10-29 MED ORDER — INSULIN ASPART 100 UNIT/ML ~~LOC~~ SOLN: 16.0000 [IU] | Freq: Three times a day (TID) | SUBCUTANEOUS | Status: DC

## 2011-10-29 MED ORDER — INSULIN ASPART 100 UNIT/ML ~~LOC~~ SOLN: 0.0000 [IU] | Freq: Three times a day (TID) | SUBCUTANEOUS | Status: DC

## 2011-10-29 MED ORDER — CIPROFLOXACIN HCL 500 MG PO TABS: 500.0000 mg | ORAL_TABLET | Freq: Two times a day (BID) | ORAL | Status: AC

## 2011-10-29 MED ORDER — BARRIER CREAM NON-SPECIFIED: 1.0000 "application " | TOPICAL_CREAM | Freq: Two times a day (BID) | TOPICAL | Status: DC | PRN

## 2011-10-29 MED ORDER — OXYCODONE HCL 5 MG PO TABS
5.0000 mg | ORAL_TABLET | ORAL | Status: DC | PRN
  Administered 2011-10-29: 5 mg via ORAL
  Filled 2011-10-29: qty 1

## 2011-10-29 MED ORDER — MORPHINE SULFATE CR 15 MG PO TB12: 15.0000 mg | ORAL_TABLET | Freq: Three times a day (TID) | ORAL | Status: DC

## 2011-10-29 MED ORDER — INSULIN GLARGINE 100 UNIT/ML ~~LOC~~ SOLN: 50.0000 [IU] | Freq: Every day | SUBCUTANEOUS | Status: DC

## 2011-10-29 MED ORDER — OXYCODONE HCL 5 MG PO TABS: 5.0000 mg | ORAL_TABLET | ORAL | Status: AC | PRN

## 2011-10-29 NOTE — Discharge Summary (Signed)
Family Medicine Teaching Service DISCHARGE SUMMARY   Patient name: Holly Kim Medical record number: [id number] Date of birth: 08/14/1941 Age: 71 y.o. Gender: female  Attending Physician: Tobin Chad Primary Care Provider: Burman Blacksmith., MD Consultants: none  Dates of Hospitalization:  10/26/2011 to 10/29/2011 Length of Stay: 3 days  Admission Diagnoses:  Generalized weakness with Dystonic Movement  Discharge Diagnoses:  Principal Problem:  *Urinary tract infection Active Problems:  DIABETES MELLITUS, II, COMPLICATIONS  HYPERCHOLESTEROLEMIA  ANEMIA, NORMOCYTIC  ANXIETY  CONSTIPATION  Impaired mobility  Patient Active Problem List  Diagnoses Date Noted  . Urinary tract infection 10/29/2011  . Unstable angina 09/10/2011  . Skin tag of female perineum 08/29/2011  . Housing or economic circumstance 08/04/2011  . Restrictive lung disease 07/29/2011  . Dyspnea 07/16/2011  . Encounter for palliative care 06/24/2011  . Diastolic dysfunction 06/07/2011  . Blindness - both eyes 03/07/2011  . Impaired mobility 03/07/2011  . Encounter for long-term (current) use of high-risk medication 02/20/2011  . Abdominal pain 02/20/2011  . Abdominal pain, right upper quadrant 09/03/2010  . CAD 06/25/2010  . CONSTIPATION 11/23/2009  . ANEMIA, NORMOCYTIC 09/21/2009  . TARDIVE DYSKINESIA 09/12/2009  . ADULT MALTREATMENT UNSPECIFIED NEC 09/06/2009  . CHEST PAIN, ATYPICAL 08/08/2009  . CHRONIC AIRWAY OBSTRUCTION NEC 07/21/2009  . Morbid obesity 05/19/2009  . ANXIETY 02/13/2009  . VENOUS INSUFFICIENCY 02/03/2009  . FATIGUE 03/09/2008  . GERD 02/02/2008  . BACK PAIN, CHRONIC 06/03/2007  . DIABETES MELLITUS, II, COMPLICATIONS 12/11/2006  . HYPERCHOLESTEROLEMIA 12/11/2006  . BORDERLINE PERSONALITY 12/11/2006  . DEPRESSIVE DISORDER, NOS 12/11/2006  . NEUROPATHY, PERIPHERAL 12/11/2006  . DEEP VEIN THROMBOPHLEBITIS, LEG 12/11/2006  . CEREBROVAS DIS, LATE EFFECTS (S/P CVA)  12/11/2006    Brief Hospital Course   Holly Kim is a 71 y.o. year old female who presented with worsening progressive weakness.  She had a recent hospitalization in December of 2012 for an infected knee as well as a left foot fracture and had recently been discharged to a skilled nursing facility. She been discharged to home 5 days prior to admission and had difficulty once there. She had multiple falls no resulting injury and family felt that there were no longer able to provide the care that she requires. She has a long-standing diagnosis of tardive dyskinesia due to Reglan for gastroparesis and had been evaluated 2 days prior to admission in the emergency department for significant worsening of her symptoms. The EDP discontinued her Reglan and Phenergan due to the patient stating the side effects from the Reglan were no longer worth the beneficial effect she was receiving.  In the Emergency department it was found that she had a positive urinalysis and ciprofloxacin was begun for a seven-day course. The patient was admitted and physical therapy and occupational therapy both evaluated patient and felt that PT/OT at a SNF was appropriate.  Her chronic medical conditions including anemia, diabetes mellitus, coronary artery disease, gastroparesis and constipation were managed with her chronic home regimens. Blood medication changes of recently is to her Reglan. She was restarted on her Phenergan while in the hospital due to abdominal discomfort.  There is some concern that due to her severe gastroparesis that stopping her Reglan could result in a small bowel obstruction. She will need to continue her home bowel regimen of Senokot and Colace. She does not want to take MiraLAX as this has caused her discomfort in the past. If she develops issues with irregular bowel movements this may need to be  readdressed. She had no signs of SBO while in this hospitalization and had good bowel movements  throughout her stay.  She does have buttock decubitus wounds that were felt to be moisture associated skin damage, with denudation of the entire buttocks bilaterally. Recommending application of zinc based barrier.  No further intervention needed at this time.   She will need to be followed at the nursing home for her wound care. Significant Diagnostics:  Hematology:  Lab 10/29/11 0527 10/28/11 0500 10/26/11 1829  WBC 5.7 4.4 7.6  HGB 7.7* 7.3* 8.3*  HCT 25.8* 24.5* 27.7*  PLT 189 170 172  RDW 14.9 15.0 15.2  MCV 79.4 80.1 80.1  MCHC 29.8* 29.8* 30.0   Metabolic:  Lab 10/29/11 0527 10/28/11 0500 10/26/11 1829  NA 139 136 134*  K 4.0 4.6 4.9  CL 95* 94* 95*  CO2 38* 36* 33*  BUN 18 22 29*  CREATININE 0.97 1.03 1.19*  GLUCOSE 186* 276* 130*  CALCIUM 9.2 8.6 9.0  MG -- -- --  PHOS -- -- --    Lab 10/26/11 1829  ALKPHOS 68  BILITOT 0.2*  BILIDIR --  PROT 7.1  ALT 23  AST 19    Lab 10/29/11 1214 10/29/11 0755 10/28/11 2214 10/28/11 1808 10/28/11 1219 10/28/11 0730 10/27/11 2131 10/27/11 1729 10/27/11 1155 10/27/11 0806  GLUCAP 200* 173* 159* 258* 191* 270* 319* 295* 263* 179*   Other Labs:  Lab 10/26/11 1829  CKTOTAL 19  TROPONINI <0.30  TROPONINT --  CKMBINDEX --   MICRO 10/26/11 Urine Cx: >100,000 Klebsiella pneumoniae - Resistant to Ampicilling; otherwise pan sensitive  IMAGING: 10/26/11 Portable Chest:  1. Decreased sensitivity and specificity exam due to technique related factors, as described above.  2. Cardiomegaly and low lung volumes.  3. Left base airspace disease, likely atelectasis. 10/26/11 EKG: Sinus Bradycardia with RBBB and LAFB   Procedures:   None  Discharge Vitals and Exam:  Vitals: Patient Vitals for the past 24 hrs:  BP Temp Temp src Pulse Resp SpO2  10/29/11 0508 119/49 mmHg 97.2 F (36.2 C) Oral 59  16  99 %  10/29/11 0433 - 103.4 F (39.7 C) - - - -  10/28/11 2130 137/56 mmHg 98.7 F (37.1 C) Oral 60  16  99 %   Wt Readings  from Last 3 Encounters:  10/27/11 260 lb 4.8 oz (118.071 kg)  09/24/11 279 lb 15.8 oz (127 kg)  09/12/11 276 lb (125.193 kg)    Intake/Output Summary (Last 24 hours) at 10/29/11 1425 Last data filed at 10/29/11 1300  Gross per 24 hour  Intake   1200 ml  Output      0 ml  Net   1200 ml    PE: General: alert, cooperative, no distress, morbidly obese and slowed mentation.  Pt denies being symptomatic at 0430 of a fever.  In talking with nursing no known fever.  Determined to be erroneous charting.  No indications of fever in this pt overnight.   HEENT:AT,[place]. No scleral icterus, mmm, dystonic tongue movements  Heart: RRR. No murmurs, rubs or gallops.  Lungs: clear to auscultation, no wheezes or rales.  Abdomen: positive BS, abdomen is soft, tenderness to mild palp but no focality.  Extremities: edema bilateral 1+ pulses bilaterally significant chronic changes to feet bilaterally  Neurology: Oriented X 3. No focal motor deficits. Has some involuntary myoclonic jerks mostly in the upper extremities as well as facial features. Patient appears to be close to nonambulatory >SKIN: Defer  to wound care exam as unable to adequately examine pt due to body habitus; reported buttock  moisture associated skin damage   Discharge Information   Disposition: Home or Self Care Discharge Diet: Resume diet Discharge Condition:  Improved Discharge Activity: Ad lib   Current Discharge Medication List    START taking these medications   Details  barrier cream (NON-SPECIFIED) CREA Apply 1 application topically 2 (two) times daily as needed (zinc based barrier cream to buttocks at least BID and PRN after each tolieting or cleansing of buttocks from incontinence).    ciprofloxacin (CIPRO) 500 MG tablet Take 1 tablet (500 mg total) by mouth 2 (two) times daily. Last dose due 1/19 in PM    oxyCODONE (OXY IR/ROXICODONE) 5 MG immediate release tablet Take 1 tablet (5 mg total) by mouth every 4 (four) hours as  needed. Qty: 30 tablet, Refills: 0    promethazine (PHENERGAN) 12.5 MG tablet Take 1 tablet (12.5 mg total) by mouth every 6 (six) hours as needed. Qty: 30 tablet      CONTINUE these medications which have CHANGED   Details  !! insulin aspart (NOVOLOG) 100 UNIT/ML injection Inject 16-24 Units into the skin 3 (three) times daily before meals. 16 Units ac breakfast; 20 Units ac lung; 24 Units ac supper Qty: 1 vial, Refills: 12    !! insulin aspart (NOVOLOG) 100 UNIT/ML injection Inject 0-15 Units into the skin 3 (three) times daily with meals.  CBG < 70: implement hypoglycemia protocol CBG 70 - 120: 0 units CBG 121 - 150: 2 units CBG 151 - 200: 3 units CBG 201 - 250: 5 units CBG 251 - 300: 8 units CBG 301 - 350: 11 units CBG 351 - 400: 15 units Qty: 1 vial    insulin glargine (LANTUS) 100 UNIT/ML injection Inject 50 Units into the skin daily. Qty: 10 mL    morphine (MS CONTIN) 15 MG 12 hr tablet Take 1 tablet (15 mg total) by mouth 3 (three) times daily. Qty: 90 tablet, Refills: 0     !! - Potential duplicate medications found. Please discuss with provider.    CONTINUE these medications which have NOT CHANGED   Details  albuterol (PROVENTIL) (2.5 MG/3ML) 0.083% nebulizer solution Take 2.5 mg by nebulization every 6 (six) hours as needed. For wheezing    aspirin 81 MG EC tablet Take 81 mg by mouth daily.      bisoprolol (ZEBETA) 5 MG tablet Take 5 mg by mouth daily.      clopidogrel (PLAVIX) 75 MG tablet Take 75 mg by mouth daily.      docusate sodium (COLACE) 100 MG capsule Take 1 capsule (100 mg total) by mouth 2 (two) times daily. Qty: 100 capsule, Refills: 1    ferrous sulfate 325 (65 FE) MG tablet Take 1 tablet (325 mg total) by mouth daily. Qty: 90 tablet, Refills: 2    lisinopril (PRINIVIL,ZESTRIL) 5 MG tablet Take 1 tablet (5 mg total) by mouth daily. Qty: 90 tablet, Refills: 0    LORazepam (ATIVAN) 1 MG tablet Take 1 tablet (1 mg total) by mouth 3 (three) times  daily as needed for anxiety. Qty: 15 tablet, Refills: 0    Multiple Vitamins-Minerals (SENIOR MULTIVITAMIN PLUS) TABS Take 1 tablet by mouth daily. Qty: 100 tablet, Refills: 1    nitroGLYCERIN (NITROSTAT) 0.4 MG SL tablet Place 0.4 mg under the tongue every 5 (five) minutes as needed. For chest pain    nystatin (MYCOSTATIN) powder Apply 1 Units  topically 2 (two) times daily.     nystatin cream (MYCOSTATIN) Apply 1 application topically 2 (two) times daily.      omeprazole (PRILOSEC) 40 MG capsule Take 40 mg by mouth daily.      rosuvastatin (CRESTOR) 40 MG tablet Take 40 mg by mouth daily.      senna (SENOKOT) 8.6 MG tablet Take 3 tablets by mouth daily.      sucralfate (CARAFATE) 1 G tablet Take 1 g by mouth 3 (three) times daily before meals.      torsemide (DEMADEX) 20 MG tablet Take 20 mg by mouth daily.        STOP taking these medications     metoCLOPramide (REGLAN) 5 MG tablet           Follow Up Issues and Recommendations:    Discharge Orders    Future Appointments: Provider: Department: Dept Phone: Center:   11/04/2011 11:00 AM Sarah T. [place] [facility] [phone] [facility]   11/11/2011 11:00 AM Wyona Almas, RD [facility] [phone] [facility]     Future Orders Please Complete By Expires   Diet - low sodium heart healthy      Diet Carb Modified      Walk with assistance        Follow-up Information    Follow up with [facility] T. .        Pending Results: none  To address at SNF:  Pt will need skilled PT/OT  Continue to monitor for regular bowel habits.  Severe hx of gastroparesis with recent discontinuation of Reglan.  Consider addition of MiraLax but pt not open to addition of this medication at this time.  Tardive Dyskinesia: Reglan d/c; phenergan cautiously added.  Monitor for worsening s/sx.  Consider discontinuation of phenergan if worsening.    Finish 7 day course of Cipro.    Wound care for buttock wounds.   Gaspar Bidding,  DO [facility] Resident - PGY-1 10/29/2011 2:25 PM

## 2011-10-29 NOTE — Telephone Encounter (Signed)
Pt states she is having trouble scheduling her follow up appt with [facility].  She called asking for assistance in scheduling.  Will put in referral to schedule. Pt currently in hospital.  Will then transfer to [place] facility, either today or tomorrow according to her.

## 2011-10-29 NOTE — Progress Notes (Signed)
Late entry for 1/14  OT Cancellation Note  Treatment cancelled today due to patient's refusal to participate due to feeling sick at her stomach.  Alba Cory 10/29/2011, 10:26 AM

## 2011-10-29 NOTE — Progress Notes (Signed)
OT Cancellation Note  Treatment cancelled today due to patient's refusal to participate despite much encouragement. Noted pt going back to SNF.  Will defer OT back to SNF at this time.  Alba Cory 10/29/2011, 10:31 AM

## 2011-10-29 NOTE — Progress Notes (Signed)
Notified MD of patient left leg pain and that her pain management is not effective at the present pt c/o 10. Will cont to monitor. Ilean Skill LPN

## 2011-10-29 NOTE — Consult Note (Signed)
WOC consult Note Reason for Consult: req to eval pt for use of Interdry Ag and to follow up on buttock wounds.  Pt has history of incontinence and is wearing briefs at home.  Buttocks present with MASD (moisture associated skin damage), denudation of entire buttocks bilaterally.  Currently nursing has implemented skin care with zinc based barrier which is best practice for this pt to treat this type of skin irritation. Assisted NT today with cleansing this area and reapplication of zinc based barrier.  Assessment of skin folds for InterDry Ag, this pt has no symptoms of yeast or any other type of moisture related skin damage of the intertriginous areas. Does not need this product at current time. Wound type:MASD of buttocks  Wound bed: intact, denuded skin  Drainage (amount, consistency, odor) none Periwound:intact Dressing procedure/placement/frequency:will continue treatment of buttocks with zinc based skin barrier, freq. tolieting and would suggest limited use of incontinence briefs.  Re consult if needed, will not follow at this time. Thanks  Salvator Seppala [employer], CWOCN [phone])

## 2011-10-29 NOTE — Progress Notes (Signed)
Pt discharged to [facility] via ambulance. Discharge instructions sent in packet with patient. All belongings packed and sent with patient.

## 2011-10-29 NOTE — Discharge Summary (Signed)
I have seen and examined this patient. I have discussed with Dr Berline Chough.  I agree with their findings and plans as documented in their admit note for today.

## 2011-10-29 NOTE — Progress Notes (Signed)
Pt may benefit from wound consult to obtain Interdry moisture wisk fabric for her abdominal and groin area. Ilean Skill LPN

## 2011-10-29 NOTE — Progress Notes (Signed)
This temperature charted on wrong pt.  Pt afebrile.

## 2011-10-30 NOTE — Telephone Encounter (Signed)
Called [place] cards and  Made appt 11/15/2011 @ 4pm Dr. Clifton James. Will call pt to notify of this appt.Loralee Pacas Lynetta     Called pt at home number lvm informing pt of time,date, address ([address]) phone [phone].Laureen Ochs, Viann Shove

## 2011-11-04 ENCOUNTER — Ambulatory Visit: Payer: Medicare Other | Admitting: Family Medicine

## 2011-11-11 ENCOUNTER — Ambulatory Visit: Payer: Medicare Other | Admitting: Family Medicine

## 2011-11-13 ENCOUNTER — Telehealth: Payer: Self-pay | Admitting: Family Medicine

## 2011-11-13 NOTE — Telephone Encounter (Signed)
Please call back at earliest convenience.  She wanted to discuss and thank you.

## 2011-11-15 ENCOUNTER — Encounter: Payer: Self-pay | Admitting: Cardiovascular Disease

## 2011-11-15 ENCOUNTER — Ambulatory Visit (INDEPENDENT_AMBULATORY_CARE_PROVIDER_SITE_OTHER): Payer: Medicare Other | Admitting: Cardiovascular Disease

## 2011-11-15 VITALS — BP 118/52 | HR 60 | Resp 12 | Ht 65.0 in | Wt 269.0 lb

## 2011-11-15 DIAGNOSIS — I2510 Atherosclerotic heart disease of native coronary artery without angina pectoris: Secondary | ICD-10-CM
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
## 2011-11-15 NOTE — Patient Instructions (Signed)
Your physician wants you to follow-up in:  6 months. You will receive a reminder letter in the mail two months in advance. If you don't receive a letter, please call our office to schedule the follow-up appointment.   

## 2011-11-15 NOTE — Assessment & Plan Note (Signed)
Stable post DES RCA 2011, PtCA Circumflex ostium 2012. She is morbidly obese and inactive, confined to a wheelchair. NO angina. She is difficult. Her CAD is advanced and she is not a good candidate for CABG in the future. If she has recurrent CP, she would need repeat cath as stress testing would not be a good choice given her size and extent of her disease. Continue current therapy.

## 2011-12-23 NOTE — Telephone Encounter (Signed)
Spoke with pt.  She is still in nursing home, and seems to be doing well there.  She would like to go home, but housing situation is tenuous.  [name] was hospitalized for "bleeding ulcer", he is still struggling and drinking ETOH.  Melina Schools recently had surgery for her cancer.  Pt seems happy at nursing home and states she is well looked after there.

## 2012-01-13 ENCOUNTER — Observation Stay (HOSPITAL_COMMUNITY)
Admission: EM | Admit: 2012-01-13 | Discharge: 2012-01-22 | Disposition: A | Discharge status: Skilled Nursing Facility | Payer: Medicare Other | Attending: Family Medicine | Admitting: Family Medicine

## 2012-01-13 ENCOUNTER — Emergency Department (HOSPITAL_COMMUNITY): Payer: Medicare Other

## 2012-01-13 ENCOUNTER — Other Ambulatory Visit: Payer: Self-pay

## 2012-01-13 ENCOUNTER — Encounter (HOSPITAL_COMMUNITY): Payer: Self-pay

## 2012-01-13 DIAGNOSIS — E162 Hypoglycemia, unspecified: Secondary | ICD-10-CM | POA: Diagnosis not present

## 2012-01-13 DIAGNOSIS — S82009A Unspecified fracture of unspecified patella, initial encounter for closed fracture: Secondary | ICD-10-CM | POA: Insufficient documentation

## 2012-01-13 DIAGNOSIS — M1710 Unilateral primary osteoarthritis, unspecified knee: Secondary | ICD-10-CM | POA: Insufficient documentation

## 2012-01-13 DIAGNOSIS — J984 Other disorders of lung: Secondary | ICD-10-CM | POA: Diagnosis present

## 2012-01-13 DIAGNOSIS — R1310 Dysphagia, unspecified: Principal | ICD-10-CM | POA: Insufficient documentation

## 2012-01-13 DIAGNOSIS — Y998 Other external cause status: Secondary | ICD-10-CM | POA: Insufficient documentation

## 2012-01-13 DIAGNOSIS — E1165 Type 2 diabetes mellitus with hyperglycemia: Secondary | ICD-10-CM

## 2012-01-13 DIAGNOSIS — R0602 Shortness of breath: Secondary | ICD-10-CM

## 2012-01-13 DIAGNOSIS — R1011 Right upper quadrant pain: Secondary | ICD-10-CM | POA: Insufficient documentation

## 2012-01-13 DIAGNOSIS — Z86718 Personal history of other venous thrombosis and embolism: Secondary | ICD-10-CM | POA: Insufficient documentation

## 2012-01-13 DIAGNOSIS — J449 Chronic obstructive pulmonary disease, unspecified: Secondary | ICD-10-CM | POA: Insufficient documentation

## 2012-01-13 DIAGNOSIS — N289 Disorder of kidney and ureter, unspecified: Secondary | ICD-10-CM | POA: Insufficient documentation

## 2012-01-13 DIAGNOSIS — E118 Type 2 diabetes mellitus with unspecified complications: Secondary | ICD-10-CM | POA: Diagnosis present

## 2012-01-13 DIAGNOSIS — F329 Major depressive disorder, single episode, unspecified: Secondary | ICD-10-CM | POA: Insufficient documentation

## 2012-01-13 DIAGNOSIS — I10 Essential (primary) hypertension: Secondary | ICD-10-CM | POA: Insufficient documentation

## 2012-01-13 DIAGNOSIS — F3289 Other specified depressive episodes: Secondary | ICD-10-CM | POA: Insufficient documentation

## 2012-01-13 DIAGNOSIS — F411 Generalized anxiety disorder: Secondary | ICD-10-CM | POA: Insufficient documentation

## 2012-01-13 DIAGNOSIS — IMO0002 Reserved for concepts with insufficient information to code with codable children: Secondary | ICD-10-CM | POA: Insufficient documentation

## 2012-01-13 DIAGNOSIS — F603 Borderline personality disorder: Secondary | ICD-10-CM | POA: Insufficient documentation

## 2012-01-13 DIAGNOSIS — Y9210 Unspecified residential institution as the place of occurrence of the external cause: Secondary | ICD-10-CM | POA: Insufficient documentation

## 2012-01-13 DIAGNOSIS — W19XXXA Unspecified fall, initial encounter: Secondary | ICD-10-CM | POA: Insufficient documentation

## 2012-01-13 DIAGNOSIS — K219 Gastro-esophageal reflux disease without esophagitis: Secondary | ICD-10-CM | POA: Insufficient documentation

## 2012-01-13 DIAGNOSIS — J4489 Other specified chronic obstructive pulmonary disease: Secondary | ICD-10-CM | POA: Insufficient documentation

## 2012-01-13 DIAGNOSIS — S82035A Nondisplaced transverse fracture of left patella, initial encounter for closed fracture: Secondary | ICD-10-CM | POA: Diagnosis not present

## 2012-01-13 DIAGNOSIS — E119 Type 2 diabetes mellitus without complications: Secondary | ICD-10-CM | POA: Insufficient documentation

## 2012-01-13 DIAGNOSIS — R0789 Other chest pain: Secondary | ICD-10-CM | POA: Insufficient documentation

## 2012-01-13 DIAGNOSIS — R6889 Other general symptoms and signs: Secondary | ICD-10-CM | POA: Insufficient documentation

## 2012-01-13 DIAGNOSIS — R0600 Dyspnea, unspecified: Secondary | ICD-10-CM | POA: Diagnosis present

## 2012-01-13 HISTORY — DX: Shortness of breath: R06.02

## 2012-01-13 LAB — DIFFERENTIAL
Basophils Absolute: 0 10*3/uL (ref 0.0–0.1)
Basophils Relative: 0 % (ref 0–1)
Eosinophils Absolute: 0.3 10*3/uL (ref 0.0–0.7)
Eosinophils Relative: 3 % (ref 0–5)
Lymphocytes Relative: 16 % (ref 12–46)
Lymphs Abs: 1.4 10*3/uL (ref 0.7–4.0)
Monocytes Absolute: 0.4 10*3/uL (ref 0.1–1.0)
Monocytes Relative: 5 % (ref 3–12)
Neutro Abs: 6.5 10*3/uL (ref 1.7–7.7)
Neutrophils Relative %: 76 % (ref 43–77)

## 2012-01-13 LAB — TROPONIN I: Troponin I: <0.3 ng/mL (ref <0.30)

## 2012-01-13 LAB — CBC
HCT: 26.7 % — ABNORMAL LOW (ref 36.0–46.0)
HCT: 27 % — ABNORMAL LOW (ref 36.0–46.0)
Hemoglobin: 8.4 g/dL — ABNORMAL LOW (ref 12.0–15.0)
Hemoglobin: 8.3 g/dL — ABNORMAL LOW (ref 12.0–15.0)
MCH: 26.3 pg (ref 26.0–34.0)
MCH: 25.5 pg — ABNORMAL LOW (ref 26.0–34.0)
MCHC: 31.5 g/dL (ref 30.0–36.0)
MCHC: 30.7 g/dL (ref 30.0–36.0)
MCV: 83.4 fL (ref 78.0–100.0)
MCV: 83.1 fL (ref 78.0–100.0)
Platelets: 169 10*3/uL (ref 150–400)
Platelets: 162 10*3/uL (ref 150–400)
RBC: 3.2 MIL/uL — ABNORMAL LOW (ref 3.87–5.11)
RBC: 3.25 MIL/uL — ABNORMAL LOW (ref 3.87–5.11)
RDW: 14.8 % (ref 11.5–15.5)
RDW: 14.6 % (ref 11.5–15.5)
WBC: 8.5 10*3/uL (ref 4.0–10.5)
WBC: 9.4 10*3/uL (ref 4.0–10.5)

## 2012-01-13 LAB — BASIC METABOLIC PANEL
BUN: 30 mg/dL — ABNORMAL HIGH (ref 6–23)
BUN: 30 mg/dL — ABNORMAL HIGH (ref 6–23)
CO2: 38 mEq/L — ABNORMAL HIGH (ref 19–32)
CO2: 37 mEq/L — ABNORMAL HIGH (ref 19–32)
Calcium: 9.4 mg/dL (ref 8.4–10.5)
Calcium: 9.2 mg/dL (ref 8.4–10.5)
Chloride: 94 mEq/L — ABNORMAL LOW (ref 96–112)
Chloride: 95 mEq/L — ABNORMAL LOW (ref 96–112)
Creatinine, Ser: 1.29 mg/dL — ABNORMAL HIGH (ref 0.50–1.10)
Creatinine, Ser: 1.28 mg/dL — ABNORMAL HIGH (ref 0.50–1.10)
GFR calc Af Amer: 46 mL/min — ABNORMAL LOW (ref >90)
GFR calc Af Amer: 47 mL/min — ABNORMAL LOW (ref >90)
GFR calc non Af Amer: 40 mL/min — ABNORMAL LOW (ref >90)
GFR calc non Af Amer: 40 mL/min — ABNORMAL LOW (ref >90)
Glucose, Bld: 201 mg/dL — ABNORMAL HIGH (ref 70–99)
Glucose, Bld: 218 mg/dL — ABNORMAL HIGH (ref 70–99)
Potassium: 5.2 mEq/L — ABNORMAL HIGH (ref 3.5–5.1)
Potassium: 4.8 mEq/L (ref 3.5–5.1)
Sodium: 137 mEq/L (ref 135–145)
Sodium: 138 mEq/L (ref 135–145)

## 2012-01-13 MED ORDER — ALBUTEROL SULFATE (5 MG/ML) 0.5% IN NEBU
5.0000 mg | INHALATION_SOLUTION | Freq: Once | RESPIRATORY_TRACT | Status: AC
  Administered 2012-01-13: 5 mg via RESPIRATORY_TRACT
  Filled 2012-01-13: qty 1

## 2012-01-13 MED ORDER — IPRATROPIUM BROMIDE 0.02 % IN SOLN
0.5000 mg | Freq: Once | RESPIRATORY_TRACT | Status: AC
  Administered 2012-01-13: 0.5 mg via RESPIRATORY_TRACT
  Filled 2012-01-13: qty 2.5

## 2012-01-13 NOTE — ED Notes (Signed)
Pt told this Rn  "My lungs are hurting me from where the pills went down there." Pt appears very agitated and upset and is clutching chest. Provider notified.

## 2012-01-13 NOTE — ED Notes (Signed)
Report given to [place], [place]

## 2012-01-13 NOTE — ED Provider Notes (Signed)
Medical screening examination/treatment/procedure(s) were conducted as a shared visit with non-physician practitioner(s) and myself.  I personally evaluated the patient during the encounter   Patient's workup in the emergency department without any specific findings patient still feels as if he is choking and feels more short of breath and has some chest discomfort. Troponin pending chest x-ray without acute findings there has been some wheezing still some wheezing present despite treatment [place] are very good on her normal 4 L of oxygen at 99 or even 100%. Patient is followed by family practice patient feels that she needs to be admitted rule out family practice come down to evaluate the patient for consideration for admission.    Shelda Jakes, MD 01/13/12 2250

## 2012-01-13 NOTE — ED Notes (Signed)
PA at bedside.

## 2012-01-13 NOTE — ED Notes (Signed)
Pt son explained to this RN that pt was c/o chest as a whole hurting. Provider notified.

## 2012-01-13 NOTE — ED Notes (Signed)
Per ems- pt c/o choking sensation while taking medication tonight. Airway patent, o2 on 4 L per pt norm and 99% o2 sats. Pt has no other complaints. Pt comes from nursing home with scabies, pt has been treated but pt roommate is actively being treated.

## 2012-01-13 NOTE — ED Notes (Signed)
MD at bedside. 

## 2012-01-13 NOTE — ED Notes (Signed)
Pt c/o choking sensation tonight while taking pills. No other weakness noted.

## 2012-01-14 ENCOUNTER — Encounter (HOSPITAL_COMMUNITY): Payer: Self-pay

## 2012-01-14 ENCOUNTER — Inpatient Hospital Stay (HOSPITAL_COMMUNITY): Payer: Medicare Other

## 2012-01-14 DIAGNOSIS — J441 Chronic obstructive pulmonary disease with (acute) exacerbation: Secondary | ICD-10-CM

## 2012-01-14 DIAGNOSIS — R269 Unspecified abnormalities of gait and mobility: Secondary | ICD-10-CM

## 2012-01-14 DIAGNOSIS — R1310 Dysphagia, unspecified: Secondary | ICD-10-CM

## 2012-01-14 DIAGNOSIS — K209 Esophagitis, unspecified: Secondary | ICD-10-CM

## 2012-01-14 LAB — GLUCOSE, CAPILLARY
Glucose-Capillary: 152 mg/dL — ABNORMAL HIGH (ref 70–99)
Glucose-Capillary: 157 mg/dL — ABNORMAL HIGH (ref 70–99)
Glucose-Capillary: 172 mg/dL — ABNORMAL HIGH (ref 70–99)
Glucose-Capillary: 211 mg/dL — ABNORMAL HIGH (ref 70–99)

## 2012-01-14 LAB — CARDIAC PANEL(CRET KIN+CKTOT+MB+TROPI)
CK, MB: 1.6 ng/mL (ref 0.3–4.0)
Relative Index: INVALID (ref 0.0–2.5)
Total CK: 29 U/L (ref 7–177)
Troponin I: <0.3 ng/mL (ref <0.30)

## 2012-01-14 LAB — CBC
MCH: 25.5 pg — ABNORMAL LOW (ref 26.0–34.0)
Platelets: 159 10*3/uL (ref 150–400)
RBC: 3.21 MIL/uL — ABNORMAL LOW (ref 3.87–5.11)
WBC: 9.4 10*3/uL (ref 4.0–10.5)

## 2012-01-14 LAB — BASIC METABOLIC PANEL
Calcium: 9.3 mg/dL (ref 8.4–10.5)
GFR calc Af Amer: 53 mL/min — ABNORMAL LOW (ref >90)
GFR calc non Af Amer: 46 mL/min — ABNORMAL LOW (ref >90)
Sodium: 139 mEq/L (ref 135–145)

## 2012-01-14 LAB — MRSA PCR SCREENING: MRSA by PCR: NEGATIVE

## 2012-01-14 MED ORDER — INSULIN ASPART 100 UNIT/ML ~~LOC~~ SOLN
16.0000 [IU] | Freq: Every day | SUBCUTANEOUS | Status: DC
  Administered 2012-01-15 – 2012-01-16 (×2): 16 [IU] via SUBCUTANEOUS

## 2012-01-14 MED ORDER — OXYCODONE HCL 10 MG PO TB12
10.0000 mg | ORAL_TABLET | ORAL | Status: DC | PRN
  Administered 2012-01-14: 10 mg via ORAL
  Filled 2012-01-14: qty 1

## 2012-01-14 MED ORDER — BIOTENE DRY MOUTH MT LIQD
15.0000 mL | Freq: Two times a day (BID) | OROMUCOSAL | Status: DC
  Administered 2012-01-14 – 2012-01-21 (×12): 15 mL via OROMUCOSAL

## 2012-01-14 MED ORDER — IPRATROPIUM BROMIDE 0.02 % IN SOLN
0.5000 mg | RESPIRATORY_TRACT | Status: DC | PRN
  Administered 2012-01-14: 0.5 mg via RESPIRATORY_TRACT
  Filled 2012-01-14: qty 2.5

## 2012-01-14 MED ORDER — INSULIN ASPART 100 UNIT/ML ~~LOC~~ SOLN: 24.0000 [IU] | Freq: Every day | SUBCUTANEOUS | Status: DC

## 2012-01-14 MED ORDER — MORPHINE SULFATE 2 MG/ML IJ SOLN
2.0000 mg | Freq: Once | INTRAMUSCULAR | Status: AC
  Administered 2012-01-14: 2 mg via INTRAVENOUS
  Filled 2012-01-14: qty 1

## 2012-01-14 MED ORDER — TORSEMIDE 20 MG PO TABS
20.0000 mg | ORAL_TABLET | Freq: Every day | ORAL | Status: DC
  Administered 2012-01-14 – 2012-01-22 (×9): 20 mg via ORAL
  Filled 2012-01-14 (×9): qty 1

## 2012-01-14 MED ORDER — PROMETHAZINE HCL 25 MG PO TABS
12.5000 mg | ORAL_TABLET | Freq: Four times a day (QID) | ORAL | Status: DC | PRN
  Administered 2012-01-14: 12.5 mg via ORAL
  Filled 2012-01-14: qty 1

## 2012-01-14 MED ORDER — ASPIRIN 81 MG PO TBEC: 81.0000 mg | DELAYED_RELEASE_TABLET | Freq: Every day | ORAL | Status: DC

## 2012-01-14 MED ORDER — ALBUTEROL SULFATE (5 MG/ML) 0.5% IN NEBU
2.5000 mg | INHALATION_SOLUTION | RESPIRATORY_TRACT | Status: DC
  Administered 2012-01-14 – 2012-01-15 (×5): 2.5 mg via RESPIRATORY_TRACT
  Filled 2012-01-14 (×5): qty 0.5

## 2012-01-14 MED ORDER — MORPHINE SULFATE CR 15 MG PO TB12
15.0000 mg | ORAL_TABLET | Freq: Three times a day (TID) | ORAL | Status: DC
  Administered 2012-01-14 – 2012-01-22 (×25): 15 mg via ORAL
  Filled 2012-01-14 (×25): qty 1

## 2012-01-14 MED ORDER — LORAZEPAM 1 MG PO TABS
1.0000 mg | ORAL_TABLET | Freq: Three times a day (TID) | ORAL | Status: DC | PRN
  Administered 2012-01-14 – 2012-01-22 (×12): 1 mg via ORAL
  Filled 2012-01-14 (×14): qty 1

## 2012-01-14 MED ORDER — INSULIN ASPART 100 UNIT/ML ~~LOC~~ SOLN
20.0000 [IU] | Freq: Every day | SUBCUTANEOUS | Status: DC
  Administered 2012-01-15: 20 [IU] via SUBCUTANEOUS

## 2012-01-14 MED ORDER — SUCRALFATE 1 G PO TABS
1.0000 g | ORAL_TABLET | Freq: Three times a day (TID) | ORAL | Status: DC
  Administered 2012-01-14 – 2012-01-22 (×25): 1 g via ORAL
  Filled 2012-01-14 (×29): qty 1

## 2012-01-14 MED ORDER — BISOPROLOL FUMARATE 5 MG PO TABS
5.0000 mg | ORAL_TABLET | Freq: Every day | ORAL | Status: DC
  Administered 2012-01-14 – 2012-01-22 (×9): 5 mg via ORAL
  Filled 2012-01-14 (×9): qty 1

## 2012-01-14 MED ORDER — SODIUM CHLORIDE 0.9 % IV BOLUS (SEPSIS)
500.0000 mL | Freq: Once | INTRAVENOUS | Status: AC
  Administered 2012-01-14: 500 mL via INTRAVENOUS

## 2012-01-14 MED ORDER — LISINOPRIL 5 MG PO TABS: 5.0000 mg | ORAL_TABLET | Freq: Every day | ORAL | Status: DC

## 2012-01-14 MED ORDER — PANTOPRAZOLE SODIUM 40 MG PO TBEC
40.0000 mg | DELAYED_RELEASE_TABLET | Freq: Every day | ORAL | Status: DC
  Administered 2012-01-14 – 2012-01-22 (×9): 40 mg via ORAL
  Filled 2012-01-14 (×9): qty 1

## 2012-01-14 MED ORDER — CLOPIDOGREL BISULFATE 75 MG PO TABS
75.0000 mg | ORAL_TABLET | Freq: Every day | ORAL | Status: DC
  Administered 2012-01-14 – 2012-01-16 (×3): 75 mg via ORAL
  Filled 2012-01-14 (×3): qty 1

## 2012-01-14 MED ORDER — INSULIN ASPART 100 UNIT/ML ~~LOC~~ SOLN: 16.0000 [IU] | Freq: Three times a day (TID) | SUBCUTANEOUS | Status: DC

## 2012-01-14 MED ORDER — ENOXAPARIN SODIUM 40 MG/0.4ML ~~LOC~~ SOLN
40.0000 mg | SUBCUTANEOUS | Status: DC
  Administered 2012-01-14 – 2012-01-21 (×5): 40 mg via SUBCUTANEOUS
  Filled 2012-01-14 (×10): qty 0.4

## 2012-01-14 MED ORDER — LISINOPRIL 5 MG PO TABS
5.0000 mg | ORAL_TABLET | Freq: Every day | ORAL | Status: DC
  Administered 2012-01-14 – 2012-01-16 (×3): 5 mg via ORAL
  Filled 2012-01-14 (×4): qty 1

## 2012-01-14 MED ORDER — ASPIRIN EC 81 MG PO TBEC
81.0000 mg | DELAYED_RELEASE_TABLET | Freq: Every day | ORAL | Status: DC
  Administered 2012-01-14 – 2012-01-22 (×9): 81 mg via ORAL
  Filled 2012-01-14 (×9): qty 1

## 2012-01-14 MED ORDER — INSULIN GLARGINE 100 UNIT/ML ~~LOC~~ SOLN
50.0000 [IU] | Freq: Every day | SUBCUTANEOUS | Status: DC
  Administered 2012-01-14 – 2012-01-19 (×6): 50 [IU] via SUBCUTANEOUS

## 2012-01-14 MED ORDER — OXYCODONE HCL 5 MG PO TABS
10.0000 mg | ORAL_TABLET | Freq: Four times a day (QID) | ORAL | Status: DC | PRN
  Administered 2012-01-14 – 2012-01-22 (×20): 10 mg via ORAL
  Filled 2012-01-14 (×21): qty 2

## 2012-01-14 NOTE — Progress Notes (Signed)
Inpatient Diabetes Program Recommendations  AACE/ADA: New Consensus Statement on Inpatient Glycemic Control (2009)  Target Ranges:  Prepandial:   less than 140 mg/dL      Peak postprandial:   less than 180 mg/dL (1-2 hours)      Critically ill patients:  140 - 180 mg/dL    Inpatient Diabetes Program Recommendations Correction (SSI): Start MODERATE Novolog correction TID  Insulin - Meal Coverage: May not need full home dose of Novolog meal coverage consider changing to 1/2 May want to hold meal coverage insulin until eating full diet, only getting clears now.    Thank you  Piedad Climes RN,BSN,CDE Inpatient Diabetes Coordinator

## 2012-01-14 NOTE — Progress Notes (Signed)
Clinical Social Work Department BRIEF PSYCHOSOCIAL ASSESSMENT 01/14/2012  Patient:  Holly Kim, Holly Kim     Account Number:  [id number]     Admit date:  01/13/2012  Clinical Social Worker:  Lourdes Sledge  Date/Time:  01/14/2012 02:24 PM  Referred by:  Physician  Date Referred:  01/14/2012 Referred for  SNF Placement   Other Referral:   Interview type:  Patient Other interview type:   Son- Rosalind Guido [phone]    PSYCHOSOCIAL DATA Living Status:  FACILITY Admitted from facility:  [facility] Level of care:  Skilled Nursing Facility Primary support name:  Eurika Sandy Primary support relationship to patient:  CHILD, ADULT Degree of support available:   strong    CURRENT CONCERNS Current Concerns  Post-Acute Placement   Other Concerns:    SOCIAL WORK ASSESSMENT / PLAN CSW completed assessment with pt and pt son [name]. CSW familiar with this pt who is a long term resident of [place]. Pt laying in bed alert and oriented and pleasant to speak to. Pt states she is very happy at [place] and would to return when medically stable,   Assessment/plan status:  Psychosocial Support/Ongoing Assessment of Needs Other assessment/ plan:   Information/referral to community resources:    PATIENT'S/FAMILY'S RESPONSE TO PLAN OF CARE: Pt laying in bed states she is not feeling. CSW informed pt that a barium swallow will be done per MD notes. Pt quiet with her eyes closed. Pt appreciative of CSW visit and states she would like to return to [place] when stable.

## 2012-01-14 NOTE — Progress Notes (Signed)
Consult to social work placed to facilitate transition back to [place].

## 2012-01-14 NOTE — Progress Notes (Signed)
I discussed with Dr [name].  I agree with their plans documented in their progress. Will assess swallow function with Speech Therapy evaluation of oropharyngeal swallow evaluation and upper GI barium swallow with barium pill to look for anatomic cause of pill-induced choking event.

## 2012-01-14 NOTE — Progress Notes (Signed)
Utilization review completed. Congetta Odriscoll, RN, BSN. 01/14/12 

## 2012-01-14 NOTE — ED Notes (Signed)
Report called to [phone] 

## 2012-01-14 NOTE — H&P (Signed)
I interviewed and examined this patient and discussed the care plan with Dr. Armen Pickup and the [facility] team and agree with assessment and plan as documented in the admission note for today. She reports problems with heartburn and burping before the acute episode, but denies anything sticking before the pill did. I agree with a trial of response to GI cocktail and increase in PPI and continuation of Carafate if that helps. She may need EGD as an outpatient if her esophageal symptoms persist, but that is not urgent if she can swallow soft foods without difficulty.     Delan Ksiazek A. Sheffield Slider, MD Family Medicine Teaching Service Attending  01/14/2012 11:08 AM

## 2012-01-14 NOTE — Discharge Summary (Signed)
Physician Discharge Summary  Patient ID: Holly Kim MRN: [id number] DOB/AGE: October 02, 1941 71 y.o.  Admit date: 01/13/2012 Discharge date: 01/22/2012  Admission Diagnoses: Dysphagia, Chest Pain  Discharge Diagnoses:  Principal Problem:  *ANXIETY Active Problems:  DIABETES MELLITUS, II, COMPLICATIONS  Dyspnea  Restrictive lung disease  Nondisplaced transverse fracture of left patella  Hypoglycemia   Discharged Condition: improved  Hospital Course:  Holly Kim is a 71 year old female with COPD, CAD, GI dysmotility, morbid obesity, and chronic RUQ pain who presented after an acute episode of dysphagia when taking her evening pills.    1. GI: Her work-up for dysphagia included a barium esophogram that demonstrated narrowing of the distal esophagus. An EGD was performed after 5 days of the patient being off her clopidogrel. The EGD performed by Dr. Elnoria Howard was normal. Therefore, no corrective procedure was necessary for the dysphagia.   2. Endocrine: Upon admission, Holly Kim has CBG's consistently in the 200's.Then patient had 3 episodes of hypoglycemia < 45 mg/dL.  Each was asymptomatic and treated with dextrose gel, which resolved the problem. These were the result of decreased PO intake by the patient after she was switched to a liquid diet. Each time her insulin regimen was decreased until she was only taking 10 units of Lantus with sensitive sliding scale. These are her last CBG's prior to discharge.  CBG (last 3)   Basename 01/22/12 1237 01/22/12 0745 01/21/12 2116  GLUCAP 251* 222* 173*    INSTRUCTIONS FOR RESTARTING INSULIN:  1. We must be careful not to give her hypoglycemic.  2. Lantus - Give 20 Units on day 1, 30 on day 2, 40 day 3 and 50 on day 4 to get back to her normal level of Lantus. If she is not eating well or blood sugars are below 90 in the morning, then do not increase the Lantus that day. Keep it at the same level or have her evaluted by a  physician.   3. Novolog - Start with 10 units at each meal on day one. Then increase by two units at each meal on each day until she gets back to her baseline of 16 units with breakfast, 20 units with lunch, and 24 units with dinner. If her blood sugar is below 80 before a meal, do not administer any insulin. Wait until her next meal to give her the insulin. Call a physician if it is consistently lower than 100 or higher than 250.    3. Cardiovascular: Her chest pain was not cardiovascular, because she had normal troponin x 3 and an EKG that did not reveal an ischemia. She was kept on telemetry throughout her stay and had no events. She was discharged on her home doses of Aspirin, Plavix, blood pressure medications and cholesterol medication.   4. MSK: Unfortunately, Holly Kim fell while being transferred to the bedside commode. This caused her to strike her left knee on the ground, which resulted in a closed, transverse fracture of left patella. This was treated with a knee immobilizer, which the patient will need to wear for at least 2 weeks until she sees Dr. August Saucer in the outpatient setting.   5. COPD - She was maintained on 4L of O2 and albuterol. Despite her sensation of not getting enough oxygen, she was never in respiratory distress during her hospitalization.    Consults: Speech Pathology; GI - Dr. Elnoria Howard   Significant Diagnostic Studies:   *RADIOLOGY REPORT*  Clinical Data: Dysphagia.  ESOPHOGRAM/BARIUM SWALLOW  IMPRESSION:  Mild persistent narrowing within the distal esophagus, question  peptic stricture.  Dysmotility. Occasional esophageal spasm.  Original Report Authenticated By: Cyndie Chime, M.D.  EGD: Normal   Troponin I: < 0.30 x 2   Discharge Exam: Blood pressure 100/44, pulse 61, temperature 98.3 F (36.8 C), temperature source Oral, resp. rate 22, height 5\' 4"  (1.626 m), weight 273 lb 4.8 oz (123.968 kg), SpO2 100.00%. Gen: in and out of sleep, pale, mildly  distressed, ill appearing  HEENT: edentulous, tardive dyskinesia, hypertrichosis  Cardiac: RRR, no murmurs  Lungs:upper airway sounds, diffuse wheezes, poor air movement.  Abd: large panus, NABS, no guarding, tenderness to palpation of RUQ  Extremities: 2+ edema bilaterally, exquisite tenderness to patella and distal to knees bilat   Disposition: 03-Skilled Nursing Facility  Discharge Orders    Future Orders Please Complete By Expires   Diet - low sodium heart healthy      Increase activity slowly        Medication List  As of 01/22/2012  1:47 PM   TAKE these medications         albuterol (2.5 MG/3ML) 0.083% nebulizer solution   Commonly known as: PROVENTIL   Take 2.5 mg by nebulization every 6 (six) hours as needed. For wheezing      aspirin 81 MG EC tablet   Take 81 mg by mouth daily.      bisoprolol 5 MG tablet   Commonly known as: ZEBETA   Take 5 mg by mouth daily.      clopidogrel 75 MG tablet   Commonly known as: PLAVIX   Take 75 mg by mouth daily.      docusate sodium 100 MG capsule   Commonly known as: COLACE   Take 1 capsule (100 mg total) by mouth 2 (two) times daily.      ferrous sulfate 325 (65 FE) MG tablet   Take 1 tablet (325 mg total) by mouth daily.      insulin aspart 100 UNIT/ML injection   Commonly known as: novoLOG   Inject 16-24 Units into the skin 3 (three) times daily before meals. 16 Units ac breakfast; 20 Units ac lung; 24 Units ac supper      insulin glargine 100 UNIT/ML injection   Commonly known as: LANTUS   Inject 50 Units into the skin daily.      lisinopril 5 MG tablet   Commonly known as: PRINIVIL,ZESTRIL   Take 1 tablet (5 mg total) by mouth daily.      morphine 15 MG 12 hr tablet   Commonly known as: MS CONTIN   Take 1 tablet (15 mg total) by mouth 3 (three) times daily.      nitroGLYCERIN 0.4 MG SL tablet   Commonly known as: NITROSTAT   Place 0.4 mg under the tongue every 5 (five) minutes as needed. For chest pain       omeprazole 40 MG capsule   Commonly known as: PRILOSEC   Take 40 mg by mouth daily.      oxyCODONE 5 MG immediate release tablet   Commonly known as: Oxy IR/ROXICODONE   Take 10 mg by mouth every 4 (four) hours as needed. pain      permethrin 5 % cream   Commonly known as: ELIMITE   Apply 1 application topically once.      promethazine 12.5 MG tablet   Commonly known as: PHENERGAN   Take 12.5 mg by mouth every 6 (six) hours as  needed. For nausea      rosuvastatin 40 MG tablet   Commonly known as: CRESTOR   Take 40 mg by mouth daily.      Senior Multivitamin Plus Tabs   Take 1 tablet by mouth daily.      senna 8.6 MG tablet   Commonly known as: SENOKOT   Take 3 tablets by mouth daily.      sucralfate 1 G tablet   Commonly known as: CARAFATE   Take 1 g by mouth 3 (three) times daily before meals.      torsemide 20 MG tablet   Commonly known as: DEMADEX   Take 20 mg by mouth daily.         ASK your doctor about these medications         LORazepam 1 MG tablet   Commonly known as: ATIVAN   Take 1 mg by mouth every 8 (eight) hours as needed. For anxiety           Follow-up Information    Follow up with [facility] T., MD. Schedule an appointment as soon as possible for a visit in 1 week.      Follow up with Cammy Copa, MD on 02/05/2012. (Appointment at 1:30 PM )    Contact information:   [facility] [address] [place] [address] [phone]          Signed: Mat Carne 01/22/2012, 1:47 PM

## 2012-01-14 NOTE — ED Provider Notes (Signed)
Medical screening examination/treatment/procedure(s) were conducted as a shared visit with non-physician practitioner(s) and myself.  I personally evaluated the patient during the encounter  Shelda Jakes, MD 01/14/12 [phone]

## 2012-01-14 NOTE — Evaluation (Signed)
Clinical/Bedside Swallow Evaluation Patient Details  Name: Holly Kim MRN: [id number] DOB: 1940-12-24 Today's Date: 01/14/2012  Past Medical History:  Past Medical History  Diagnosis Date  . PE (pulmonary embolism)   . DVT (deep venous thrombosis)   . Diabetes mellitus   . Asthma   . Hypertension   . CAD (coronary artery disease)     RCA ostial DES 9/11, Circumflex ostial PTCA only 09/11/11.  Marland Kitchen GERD (gastroesophageal reflux disease)   . COPD (chronic obstructive pulmonary disease)   . Obesity   . Tardive dyskinesia   . History of renal failure   . Depression   . Borderline personality disorder   . Anxiety   . Anemia     hx of blood transfusion x 3  . Arthritis   . Peripheral neuropathy    Past Surgical History:  Past Surgical History  Procedure Date  . US echocardiography 2008  . Orif ankle fracture   . Appendectomy   . Cervical discectomy   . Ptca 06/2010  . Tonsillectomy   . Transthoracic echocardiogram 06/2010    Grade 1 diastolic, EF 55-60   HPI:  Pt is a 71 year old female admitted with c/o chest pain after she reports she choked on her pill. She says she feels like it is still in her lungs, burning and that she has new wheezing since that event. She denies any consistent coughing/choking episodes but does report acid reflux and early satiety with meals. She says she constantly feels nauseated. She denies regurgitation of food. Significant PMH: COPD   Assessment/Recommendations/Treatment Plan Suspected Esophageal Findings Suspected Esophageal Findings: Belching;Globus sensation  SLP Assessment Clinical Impression Statement: Pt demonstrates appearance of normal oropharyngeal function. Mild risk of aspiraiton due to compromised respiration and increased respiratory rate and fatigue with POs. Complaints and presentation consistent with esophageal dysphagia. Await results of esophagram to provide esophageal precautions that may aid in PO intake. From a  pharyngeal standpoint, pt can tolerate a Dys 3 mechanical soft diet with thin liquids though suspect pt with difficutly in esophageal transit of larger quantites of solids. Pt is safe to consume puree consistency until results of esophagram come back.  Risk for Aspiration: Mild Other Related Risk Factors: History of esophageal-related issues;Decreased respiratory status  Swallow Evaluation Recommendations Diet Recommendations: Dysphagia 3 (Mechanical Soft);Thin liquid Liquid Administration via: Cup Medication Administration: Whole meds with puree Supervision: Patient able to self feed Compensations: Slow rate;Small sips/bites;Follow solids with liquid Postural Changes and/or Swallow Maneuvers: Seated upright 90 degrees;Upright 30-60 min after meal Follow up Recommendations: Skilled Nursing facility  Treatment Plan Treatment Plan Recommendations: Therapy as outlined in treatment plan below Speech Therapy Frequency: min 2x/week Treatment Duration: 2 weeks Interventions: Patient/family education;Trials of upgraded texture/liquids;Diet toleration management by SLP;Compensatory techniques;Aspiration precaution training  Prognosis Prognosis for Safe Diet Advancement: Fair  Individuals Consulted Consulted and Agree with Results and Recommendations: Patient  Swallowing Goals  SLP Swallowing Goals Patient will consume recommended diet without observed clinical signs of aspiration with: Minimal assistance Patient will utilize recommended strategies during swallow to increase swallowing safety with: Minimal cueing  Harlon Ditty, MA CCC-SLP [phone] Claudine Mouton 01/14/2012,3:14 PM

## 2012-01-14 NOTE — ED Provider Notes (Signed)
History     CSN: [id number]  Arrival date & time 01/13/12  2117   First MD Initiated Contact with Patient 01/13/12 2122      Chief Complaint  Patient presents with  . Choking sensation     (Consider location/radiation/quality/duration/timing/severity/associated sxs/prior treatment) The history is provided by the patient.   The patient presents to the ER with sensation that she has something stuck in her chest. She states that she thinks her pills went into her lungs. The patient denies N/V, weakness, fever, cough, abd pain, chest pain, or headache. The patient states that she is normally on 4L of O2 at her Nursing Home.The patient states that nothing has seemed to help her with this sensation.  Past Medical History  Diagnosis Date  . PE (pulmonary embolism)   . DVT (deep venous thrombosis)   . Diabetes mellitus   . Asthma   . Hypertension   . CAD (coronary artery disease)     RCA ostial DES 9/11, Circumflex ostial PTCA only 09/11/11.  Marland Kitchen GERD (gastroesophageal reflux disease)   . COPD (chronic obstructive pulmonary disease)   . Obesity   . Tardive dyskinesia   . History of renal failure   . Depression   . Borderline personality disorder   . Anxiety   . Anemia     hx of blood transfusion x 3  . Arthritis   . Peripheral neuropathy     Past Surgical History  Procedure Date  . US echocardiography 2008  . Orif ankle fracture   . Appendectomy   . Cervical discectomy   . Ptca 06/2010  . Tonsillectomy   . Transthoracic echocardiogram 06/2010    Grade 1 diastolic, EF 55-60    Family History  Problem Relation Age of Onset  . Breast cancer      aunt  . Alcohol abuse Son   . Cancer Daughter   . Leukemia Father     deceased  . Alzheimer's disease Mother     deceased    History  Substance Use Topics  . Smoking status: Passive Smoker  . Smokeless tobacco: Never Used   Comment: never smoked - children smoke - lives with son.  . Alcohol Use: No    OB History    Grav Para Term Preterm Abortions TAB SAB Ect Mult Living                  Review of Systems All pertinent positives and negatives reviewed in the history of present illness  Allergies  Ibuprofen; Amlodipine besylate; Amoxicillin; Codeine; Cortisone; Haloperidol lactate; Reglan; and Naproxen  Home Medications   Current Outpatient Rx  Name Route Sig Dispense Refill  . ALBUTEROL SULFATE (2.5 MG/3ML) 0.083% IN NEBU Nebulization Take 2.5 mg by nebulization every 6 (six) hours as needed. For wheezing    . ASPIRIN 81 MG PO TBEC Oral Take 81 mg by mouth daily.      Marland Kitchen BISOPROLOL FUMARATE 5 MG PO TABS Oral Take 5 mg by mouth daily.      Marland Kitchen CLOPIDOGREL BISULFATE 75 MG PO TABS Oral Take 75 mg by mouth daily.      Marland Kitchen DOCUSATE SODIUM 100 MG PO CAPS Oral Take 1 capsule (100 mg total) by mouth 2 (two) times daily. 100 capsule 1  . FERROUS SULFATE 325 (65 FE) MG PO TABS Oral Take 1 tablet (325 mg total) by mouth daily. 90 tablet 2  . INSULIN ASPART 100 UNIT/ML [place] SOLN Subcutaneous Inject 16-24 Units into  the skin 3 (three) times daily before meals. 16 Units ac breakfast; 20 Units ac lung; 24 Units ac supper 1 vial 12  . INSULIN GLARGINE 100 UNIT/ML [place] SOLN Subcutaneous Inject 50 Units into the skin daily. 10 mL   . LISINOPRIL 5 MG PO TABS Oral Take 1 tablet (5 mg total) by mouth daily. 90 tablet 0  . LORAZEPAM 1 MG PO TABS Oral Take 1 mg by mouth every 8 (eight) hours as needed. For anxiety    . MORPHINE SULFATE ER 15 MG PO TB12 Oral Take 1 tablet (15 mg total) by mouth 3 (three) times daily. 90 tablet 0  . SENIOR MULTIVITAMIN PLUS PO TABS Oral Take 1 tablet by mouth daily. 100 tablet 1  . NITROGLYCERIN 0.4 MG SL SUBL Sublingual Place 0.4 mg under the tongue every 5 (five) minutes as needed. For chest pain    . OMEPRAZOLE 40 MG PO CPDR Oral Take 40 mg by mouth daily.      . OXYCODONE HCL ER 10 MG PO TB12 Oral Take 10 mg by mouth every 4 (four) hours as needed. For pain    . PERMETHRIN 5 % EX CREA Topical  Apply 1 application topically once.    Marland Kitchen PROMETHAZINE HCL 12.5 MG PO TABS Oral Take 12.5 mg by mouth every 6 (six) hours as needed. For nausea    . ROSUVASTATIN CALCIUM 40 MG PO TABS Oral Take 40 mg by mouth daily.      . SENNOSIDES 8.6 MG PO TABS Oral Take 3 tablets by mouth daily.      . SUCRALFATE 1 G PO TABS Oral Take 1 g by mouth 3 (three) times daily before meals.      . TORSEMIDE 20 MG PO TABS Oral Take 20 mg by mouth daily.        BP 104/32  Pulse 74  Temp(Src) 98.6 F (37 C) (Oral)  Resp 18  SpO2 100%  Physical Exam  Constitutional: She is oriented to person, place, and time. She appears well-developed and well-nourished. No distress.  HENT:  Head: Normocephalic and atraumatic.  Eyes: Pupils are equal, round, and reactive to light.  Neck: Normal range of motion. Neck supple.  Cardiovascular: Normal rate, regular rhythm and normal heart sounds.  Exam reveals no gallop and no friction rub.   No murmur heard. Pulmonary/Chest: Effort normal. No respiratory distress. She has wheezes. She has rhonchi.  Neurological: She is alert and oriented to person, place, and time.  Skin: Skin is warm and dry. No rash noted.    ED Course  Procedures (including critical care time)  Labs Reviewed  CBC - Abnormal; Notable for the following:    RBC 3.20 (*)    Hemoglobin 8.4 (*)    HCT 26.7 (*)    All other components within normal limits  BASIC METABOLIC PANEL - Abnormal; Notable for the following:    Potassium 5.2 (*)    Chloride 94 (*)    CO2 38 (*)    Glucose, Bld 201 (*)    BUN 30 (*)    Creatinine, Ser 1.29 (*)    GFR calc non Af Amer 40 (*)    GFR calc Af Amer 46 (*)    All other components within normal limits  CBC - Abnormal; Notable for the following:    RBC 3.25 (*)    Hemoglobin 8.3 (*)    HCT 27.0 (*)    MCH 25.5 (*)    All other components  within normal limits  BASIC METABOLIC PANEL - Abnormal; Notable for the following:    Chloride 95 (*)    CO2 37 (*)     Glucose, Bld 218 (*)    BUN 30 (*)    Creatinine, Ser 1.28 (*)    GFR calc non Af Amer 40 (*)    GFR calc Af Amer 47 (*)    All other components within normal limits  DIFFERENTIAL  TROPONIN I   Dg Chest Port 1 View  01/13/2012  *RADIOLOGY REPORT*  Clinical Data: Shortness of breath.  History of asthma.  PORTABLE CHEST - 1 VIEW  Comparison: 10/26/2011  Findings: Shallow inspiration.  Mild cardiac enlargement with normal pulmonary vascularity, similar to previous study.  No focal airspace consolidation in the lungs.  No blunting of costophrenic angles.  No pneumothorax.  Calcification of the aorta. Retrocardiac space is obscured visualization.  IMPRESSION: Shallow inspiration.  Cardiac enlargement.  Stable appearance since previous study.  Original Report Authenticated By: Marlon Pel, M.D.   I spoke with FP about the patient and they will be down to see the patient. The patient is not comfortable going back to her nursing home.      MDM  MDM Reviewed: nursing note, vitals and previous chart Reviewed previous: labs, x-ray and ECG Interpretation: labs, ECG and x-ray Consults: admitting MD   Date: 01/14/2012  Rate: 59  Rhythm: normal sinus rhythm  QRS Axis: normal  Intervals: normal  ST/T Wave abnormalities: nonspecific ST/T changes  Conduction Disutrbances:right bundle branch block  Narrative Interpretation:   Old EKG Reviewed: unchanged           Carlyle Dolly, PA-C 01/14/12 [phone]

## 2012-01-14 NOTE — H&P (Signed)
Holly Kim is an 71 y.o. female.   PCP: Dr. Sarah [place], [facility] Historians: Patient, her son and son's girlfriend.  Chief Complaint: choking sensation/chest pain/dyspnea. HPI: 71 yo F with a past medical history significant for CAD,COPD, GERD, depression and borderline personality disorder presents via EMS from [facility] with a complaint of substernal chest pain, feeling of choking and shortness of breath immediately following PM medications. She reports feeling that a pill, " went down the wrong way and is now stuck in the lung". She had an episode of coughing after taking the pill. She was given Ativan I mg x 1 , SL nitro .4 x2 and aspirin 324 x 1 prior to transfer to the ED. In the ED she was given a duoneb x 1. She reports that her pain has resolved, but the choking sensation and feeling of shortness of breath remains. She reports that she does not feel comfortable returning to the nursing home tonight. She denies radiating pain, syncope/presyncope, fever. She has lived in the nursing home for 2.5 months now. She reports missing home, but she agrees that the nursing home is the best place for her. She would like to return to the same nursing home upon discharge.   Past Medical History  Diagnosis Date  . PE (pulmonary embolism)   . DVT (deep venous thrombosis)   . Diabetes mellitus   . Asthma   . Hypertension   . CAD (coronary artery disease)     RCA ostial DES 9/11, Circumflex ostial PTCA only 09/11/11.  Marland Kitchen GERD (gastroesophageal reflux disease)   . COPD (chronic obstructive pulmonary disease)   . Obesity   . Tardive dyskinesia   . History of renal failure   . Depression   . Borderline personality disorder   . Anxiety   . Anemia     hx of blood transfusion x 3  . Arthritis   . Peripheral neuropathy     Past Surgical History  Procedure Date  . US echocardiography 2008  . Orif ankle fracture   . Appendectomy   . Cervical discectomy   .  Ptca 06/2010  . Tonsillectomy   . Transthoracic echocardiogram 06/2010    Grade 1 diastolic, EF 55-60    Family History  Problem Relation Age of Onset  . Breast cancer      aunt  . Alcohol abuse Son   . Cancer Daughter   . Leukemia Father     deceased  . Alzheimer's disease Mother     deceased   Social History:  reports that she has been passively smoking.  She has never used smokeless tobacco. She reports that she does not drink alcohol or use illicit drugs.  Allergies:  Allergies  Allergen Reactions  . Ibuprofen     REACTION: "mouth swells up and I can't swallow good"  . Amlodipine Besylate Other (See Comments)    REACTION: hypotension  . Amoxicillin Itching  . Codeine Itching  . Cortisone Other (See Comments)    Pt states she gets "blood poisoning where they give me the shots of cortisone"  . Haloperidol Lactate Other (See Comments)    REACTION: My head gets places before my body  . Reglan     Shaking   . Naproxen Rash    Medications Prior to Admission  Medication Sig Dispense Refill  . albuterol (PROVENTIL) (2.5 MG/3ML) 0.083% nebulizer solution Take 2.5 mg by nebulization every 6 (six) hours as needed. For wheezing      .  aspirin 81 MG EC tablet Take 81 mg by mouth daily.        . bisoprolol (ZEBETA) 5 MG tablet Take 5 mg by mouth daily.        . clopidogrel (PLAVIX) 75 MG tablet Take 75 mg by mouth daily.        Marland Kitchen docusate sodium (COLACE) 100 MG capsule Take 1 capsule (100 mg total) by mouth 2 (two) times daily.  100 capsule  1  . ferrous sulfate 325 (65 FE) MG tablet Take 1 tablet (325 mg total) by mouth daily.  90 tablet  2  . insulin aspart (NOVOLOG) 100 UNIT/ML injection Inject 16-24 Units into the skin 3 (three) times daily before meals. 16 Units ac breakfast; 20 Units ac lung; 24 Units ac supper  1 vial  12  . insulin glargine (LANTUS) 100 UNIT/ML injection Inject 50 Units into the skin daily.  10 mL    . lisinopril (PRINIVIL,ZESTRIL) 5 MG tablet Take 1 tablet  (5 mg total) by mouth daily.  90 tablet  0  . morphine (MS CONTIN) 15 MG 12 hr tablet Take 1 tablet (15 mg total) by mouth 3 (three) times daily.  90 tablet  0  . Multiple Vitamins-Minerals (SENIOR MULTIVITAMIN PLUS) TABS Take 1 tablet by mouth daily.  100 tablet  1  . nitroGLYCERIN (NITROSTAT) 0.4 MG SL tablet Place 0.4 mg under the tongue every 5 (five) minutes as needed. For chest pain      . omeprazole (PRILOSEC) 40 MG capsule Take 40 mg by mouth daily.        Marland Kitchen oxyCODONE (OXYCONTIN) 10 MG 12 hr tablet Take 10 mg by mouth every 4 (four) hours as needed. For pain      . promethazine (PHENERGAN) 12.5 MG tablet Take 12.5 mg by mouth every 6 (six) hours as needed. For nausea      . rosuvastatin (CRESTOR) 40 MG tablet Take 40 mg by mouth daily.        Marland Kitchen senna (SENOKOT) 8.6 MG tablet Take 3 tablets by mouth daily.        . sucralfate (CARAFATE) 1 G tablet Take 1 g by mouth 3 (three) times daily before meals.        . torsemide (DEMADEX) 20 MG tablet Take 20 mg by mouth daily.         ROS As per HPI  Blood pressure 113/32, pulse 67, temperature 98.6 F (37 C), temperature source Oral, resp. rate 18, SpO2 100.00%. Physical Exam  General appearance: alert, cooperative and no distress Head: Normocephalic, without obvious abnormality, atraumatic Neck: no adenopathy, no carotid bruit, no JVD and supple, symmetrical, trachea midline Lungs: normal WOB. Diffuse rhonic and scattered exipiratory wheezing throughout. On 4 L [place] (baseline O2 requirement).  Heart: regular rate and rhythm, S1, S2 normal, no murmur, click, rub or gallop Extremities: no edema. Boot on L foot.  Skin: Skin color, texture, turgor normal. No rashes or lesions Neurologic: Grossly normal  Pertinent Labs/Studies:  WBC 8.5  Hgb 8.4 Hct 27 Plt 169 Na 137 K 5.2 > 4.8 CO2 37 BUN 30 (18-22) Cr 1.3 (1) Glucose 201-218 Trop < 0.30    CXR 01/13/12:  Shallow inspiration.  Cardiac enlargement.  Stable appearance since previous  study. EKG 01/13/12: NSR, HR 59, normal axis, RBB, QTc 456 unchanged compared to EKG on 10/26/11  Assessment/Plan 71 yo  F with subjective SOB and substernal chest pain/choking sensation following administration of PO medications.   1.  SOB: Baseline O2 requirement and stable CXR. CO2 at baseline (33-38). P:  -admit for overnight observation -duonebs q 4 prn. Continue home albuterol  -continue supplemental O2   2. Chest pain:  -no acute process on CXR -baseline O2 requirement, normal HR, normal BP. Low suspicion for PE.  -negative trop x 1. Will repeat set in 1 hr. Continue ASA.   3. HTN: continue home meds except ACE.   4. GERD: continue home meds.  5. Anxiety/chornic pain: continue home meds.   6. DVT prophylaxis: Lovenox.   7. FEN/GI: hyperkalemia improved with albuterol. Continue albuterol prn and recheck BMET in AM.   8. Renal insufficiency: BUN and Cr slightly increased from baseline. Will give 500 cc NS bolus x 1. Hold ACE.   8.  Dispo: pending repeat CEs and pain/anxiety control. Plan to discharge back to nursing home in the morning if patient remains medically stable.   Holly Kim 01/14/2012, 2:09 AM

## 2012-01-14 NOTE — Progress Notes (Signed)
Daily Progress Note Holly Kim. [name], M.D., M.B.A  Family Medicine PGY-1 Pager [phone]  Subjective: Patient with choking sensation, starts as pain in RUQ, is worse than chronic RUQ pain; is associated with chronic nausea, vomiting, and decreased appetite  Objective: Vital signs in last 24 hours: Temp:  [98.2 F (36.8 C)-98.6 F (37 C)] 98.6 F (37 C) (04/02 0834) Pulse Rate:  [60-83] 83  (04/02 0834) Resp:  [12-21] 17  (04/02 0834) BP: (94-135)/(31-79) 135/61 mmHg (04/02 0834) SpO2:  [92 %-100 %] 92 % (04/02 0834) Weight:  [272 lb 14.9 oz (123.8 kg)] 272 lb 14.9 oz (123.8 kg) (04/02 0436) Weight change:  Last BM Date: 01/14/12  Gen: sleeping when I entered room, awoke and told me of choking sensation, very ill appearing, obese body habitus HEENT: edentulous, tardive dyskinesia Cardiac: RRR, no murmurs Lungs: rhonchi, diffuse wheezes  Abd: large panus, NABS, no guarding, tenderness to palpation of RUQ Extremities: 2+ edema bilaterally, exquisite tenderness distal to knees bilat   Lab Results:  Basename 01/14/12 0550 01/13/12 2244  WBC 9.4 9.4  HGB 8.2* 8.3*  HCT 26.7* 27.0*  PLT 159 162   BMET  Basename 01/14/12 0550 01/13/12 2244  NA 139 138  K 4.7 4.8  CL 96 95*  CO2 37* 37*  GLUCOSE 142* 218*  BUN 29* 30*  CREATININE 1.16* 1.28*  CALCIUM 9.3 9.2    Studies/Results: Dg Chest Port 1 View  01/13/2012  *RADIOLOGY REPORT*  Clinical Data: Shortness of breath.  History of asthma.  PORTABLE CHEST - 1 VIEW  Comparison: 10/26/2011  Findings: Shallow inspiration.  Mild cardiac enlargement with normal pulmonary vascularity, similar to previous study.  No focal airspace consolidation in the lungs.  No blunting of costophrenic angles.  No pneumothorax.  Calcification of the aorta. Retrocardiac space is obscured visualization.  IMPRESSION: Shallow inspiration.  Cardiac enlargement.  Stable appearance since previous study.  Original Report Authenticated By: Marlon Pel, M.D.    Medications: I have reviewed the patient's current medications.  Assessment/Plan: 71 year old female with CAD, COPD, DM, morbid obesity, and chronic RUQ pain who presented with worsening epigastric pain and a "choking sensation" who does not appear improved. The etiology is unclear, but likely no cardiac or exacerbation of COPD. Most likely a GI issue such as GERD or biliary dyskinesia.   1. Cardiac - not cardiac pain as trop I neg x 2  - continue home meds: ASA, plavix, bisoprolol, torsemide  2. SOB - no evidence of acute cardiopulmonary issues on CXR, stable COPD  - continue albuterol  3. GI - hx of dysmotility (no longer on reglan) and RUQ pain which she attributes to gall bladder, but not a surgical candidate  - continue carafate, MS contin BID  - added oxycodone 10mg  PRN severe pain  4. DM - continue insulin   5. Renal - improved Creatinine with hydration  6. FEN - SLIV; clear liquid diet   7. Dispo - d/c to nursing home when pain controlled    LOS: 1 day   Mat Carne 01/14/2012, 10:08 AM

## 2012-01-15 LAB — GLUCOSE, CAPILLARY
Glucose-Capillary: 68 mg/dL — ABNORMAL LOW (ref 70–99)
Glucose-Capillary: 114 mg/dL — ABNORMAL HIGH (ref 70–99)
Glucose-Capillary: 165 mg/dL — ABNORMAL HIGH (ref 70–99)

## 2012-01-15 MED ORDER — INSULIN ASPART 100 UNIT/ML ~~LOC~~ SOLN: 0.0000 [IU] | Freq: Three times a day (TID) | SUBCUTANEOUS | Status: DC

## 2012-01-15 MED ORDER — ALBUTEROL SULFATE (5 MG/ML) 0.5% IN NEBU
2.5000 mg | INHALATION_SOLUTION | Freq: Four times a day (QID) | RESPIRATORY_TRACT | Status: DC
  Administered 2012-01-15 – 2012-01-16 (×2): 2.5 mg via RESPIRATORY_TRACT
  Filled 2012-01-15 (×2): qty 0.5

## 2012-01-15 MED ORDER — ALBUTEROL SULFATE (5 MG/ML) 0.5% IN NEBU: 2.5000 mg | INHALATION_SOLUTION | RESPIRATORY_TRACT | Status: DC | PRN

## 2012-01-15 MED ORDER — SODIUM CHLORIDE 0.9 % IV BOLUS (SEPSIS)
1000.0000 mL | Freq: Once | INTRAVENOUS | Status: AC
  Administered 2012-01-15: 1000 mL via INTRAVENOUS

## 2012-01-15 MED ORDER — MORPHINE SULFATE 2 MG/ML IJ SOLN
2.0000 mg | Freq: Once | INTRAMUSCULAR | Status: AC
  Administered 2012-01-15: 2 mg via INTRAVENOUS
  Filled 2012-01-15: qty 1

## 2012-01-15 MED ORDER — GLUCOSE 40 % PO GEL
ORAL | Status: AC
  Administered 2012-01-15: 37.5 g
  Filled 2012-01-15: qty 1

## 2012-01-15 MED ORDER — IPRATROPIUM BROMIDE 0.02 % IN SOLN: 0.5000 mg | RESPIRATORY_TRACT | Status: DC | PRN

## 2012-01-15 MED ORDER — SODIUM CHLORIDE 0.45 % IV SOLN
INTRAVENOUS | Status: DC
  Administered 2012-01-15 – 2012-01-20 (×11): via INTRAVENOUS

## 2012-01-15 NOTE — Progress Notes (Signed)
Clinical Child psychotherapist (CSW) informed that pt is not medically stable for dc today. CSW has informed facility and will facilitate with dc when pt stable.  [name], MSW, Theresia Majors [phone]

## 2012-01-15 NOTE — Progress Notes (Signed)
I discussed with Dr [name].  I agree with their plans documented in their progress note for today. Will consult GI service for evaluation and treatment of esophageal stricture seen on barium esophagram.

## 2012-01-15 NOTE — Progress Notes (Signed)
Inpatient Diabetes Program Recommendations  AACE/ADA: New Consensus Statement on Inpatient Glycemic Control (2009)  Target Ranges:  Prepandial:   less than 140 mg/dL      Peak postprandial:   less than 180 mg/dL (1-2 hours)      Critically ill patients:  140 - 180 mg/dL   Reason for Visit: Hyprglycemia sustained in 200's range  Inpatient Diabetes Program Recommendations Correction (SSI): In addition to meal coverage, add sensitive correction tidwc and HS scale. Insulin - Meal Coverage: Pt getting clear liquids which do have carbohydrate content.  Continue meal coverage.  Note: Thank you, Lenor Coffin, RN, CNS, Diabetes Coordinator [phone])

## 2012-01-15 NOTE — Progress Notes (Signed)
RT assessment done at this time, and nebs changed to q6 and q2 prn. BBS diminished and slightly coarse, but no wheezing noted at this time. Patient stated she just takes them q6 prn at SNF. I feel that some of her SOB is due to anxiety and pain. RT will continue to monitor.

## 2012-01-15 NOTE — Progress Notes (Signed)
Speech Language Pathology Dysphagia Treatment  Patient Details Name: Holly Kim MRN: [id number] DOB: September 12, 1941 Today's Date: 01/15/2012  SLP Assessment/Plan/Recommendation Assessment / Recommendations / Plan Clinical Impression Statement: Pt now on a thin liquid diet s/p finding of possible peptic stricture at GE junction. SLP observed pt with thin liquids with no overt s/s of aspiration despite significant shortness of breath during continuous straw sips. Attempted education with pt regarding aspiration precautions though pt not very recpetive to suggestions due to discomfort with upright posture. Await MD recommendations and possible treatment for esophageal dysphagia prior to providing extensive education regarding esophageal precautions. Given stricture consider crushed meds if necessary.  Continue with Current Diet: Thin liquid Liquids provided via: Straw Medication Administration: Crushed with puree Supervision: Patient able to self feed Compensations: Slow rate;Small sips/bites;Follow solids with liquid Postural Changes and/or Swallow Maneuvers: Seated upright 90 degrees;Upright 30-60 min after meal Swallowing Goals  SLP Swallowing Goals Patient will consume recommended diet without observed clinical signs of aspiration with: Minimal assistance Swallow Study Goal #1 - Progress: Progressing toward goal Patient will utilize recommended strategies during swallow to increase swallowing safety with: Minimal cueing Swallow Study Goal #2 - Progress: Progressing toward goal  General Temperature Spikes Noted: No Respiratory Status: Supplemental O2 delivered via (comment) Behavior/Cognition: Alert Oral Cavity - Dentition: Edentulous Patient Positioning: Postural control interferes with function  Dysphagia Treatment Treatment focused on: Skilled observation of diet tolerance;Patient/family/caregiver education Treatment Methods/Modalities: Skilled observation Patient observed  directly with PO's: Yes Type of PO's observed: Thin liquids Feeding: Able to feed self Liquids provided via: Straw Type of cueing: Verbal  Harlon Ditty, MA CCC-SLP [phone]  Claudine Mouton 01/15/2012, 9:02 AM

## 2012-01-15 NOTE — Progress Notes (Signed)
Daily Progress Note [name]. [name], M.D., M.B.A  Family Medicine PGY-1 Pager [phone]  Subjective: Patient still with choking sensation this AM, has tolerated liquids moderately well   Objective: Vital signs in last 24 hours: Temp:  [98.4 F (36.9 C)-98.6 F (37 C)] 98.6 F (37 C) (04/03 1000) Pulse Rate:  [60-74] 74  (04/03 1000) Resp:  [19-20] 20  (04/03 1000) BP: (100-124)/(44-54) 124/50 mmHg (04/03 1000) SpO2:  [96 %-100 %] 96 % (04/03 1000) Weight:  [272 lb 14.9 oz (123.8 kg)] 272 lb 14.9 oz (123.8 kg) (04/02 2137) Weight change: 0 lb (0 kg) Last BM Date: 01/14/12  Gen: sleeping when I entered room, awoke and told me of choking sensation, very ill appearing, obese body habitus HEENT: edentulous, tardive dyskinesia Cardiac: RRR, no murmurs Lungs: rhonchi, diffuse wheezes  Abd: large panus, NABS, no guarding, tenderness to palpation of RUQ Extremities: 2+ edema bilaterally, exquisite tenderness distal to knees bilat   Lab Results:  Basename 01/14/12 0550 01/13/12 2244  WBC 9.4 9.4  HGB 8.2* 8.3*  HCT 26.7* 27.0*  PLT 159 162   BMET  Basename 01/14/12 0550 01/13/12 2244  NA 139 138  K 4.7 4.8  CL 96 95*  CO2 37* 37*  GLUCOSE 142* 218*  BUN 29* 30*  CREATININE 1.16* 1.28*  CALCIUM 9.3 9.2    Studies/Results: Dg Esophagus  01/14/2012  *RADIOLOGY REPORT*  Clinical Data: Dysphagia.  ESOPHOGRAM/BARIUM SWALLOW  Technique:  Single contrast examination was performed using thin barium.  Fluoroscopy time:  1.6 minutes.  Comparison:  None.  Findings:  Fluoroscopic evaluation of swallowing demonstrates disruption of 3/5 primary esophageal peristaltic waves.  Diffuse occasional tertiary contractions/spasm.  Within the distal esophagus, there is an area of mild to moderate short segment narrowing which appears smooth just above the GE junction.  This could reflect a peptic stricture.  No areas of fold thickening.  IMPRESSION: Mild persistent narrowing within the distal  esophagus, question peptic stricture.  Dysmotility.  Occasional esophageal spasm.  Original Report Authenticated By: Cyndie Chime, M.D.   Dg Chest Port 1 View  01/13/2012  *RADIOLOGY REPORT*  Clinical Data: Shortness of breath.  History of asthma.  PORTABLE CHEST - 1 VIEW  Comparison: 10/26/2011  Findings: Shallow inspiration.  Mild cardiac enlargement with normal pulmonary vascularity, similar to previous study.  No focal airspace consolidation in the lungs.  No blunting of costophrenic angles.  No pneumothorax.  Calcification of the aorta. Retrocardiac space is obscured visualization.  IMPRESSION: Shallow inspiration.  Cardiac enlargement.  Stable appearance since previous study.  Original Report Authenticated By: Marlon Pel, M.D.    Medications: I have reviewed the patient's current medications.  Assessment/Plan: 71 year old female with CAD, COPD, DM, morbid obesity, and chronic RUQ pain who presented with worsening epigastric pain and a "choking sensation" that is likely from esophageal narrowing noted on esophagram.   1. Cardiac - not cardiac pain as trop I neg x 2  - continue home meds: ASA, plavix, bisoprolol, torsemide  2. Pulm - no evidence of acute cardiopulmonary issues on CXR, stable COPD  - continue albuterol  3. GI - consult GI today for recommendations of esophageal narrowing   - continue carafate, MS contin BID  - added oxycodone 10mg  PRN severe pain  4. DM - add sensitive sliding scale to home regimen based on persistently elevated CBG and diabetes coordinator's very helpful note  6. FEN - poorly hydrated today so give IL fluid bolus and start  MIVF, clear liquid diet   7. Dispo - d/c to nursing home when clinically stable   LOS: 2 days   Mat Carne 01/15/2012, 1:32 PM

## 2012-01-15 NOTE — Progress Notes (Signed)
At 1615 cbg was 42 patient got a glucose gel and drank some soda. At 1715 rechecked and only up to 68 but patient in middle of eating dinner. Rechecked at 1813 after dinner and it came up to 114. Insulin held this evening due to the low blood sugar.

## 2012-01-16 LAB — GLUCOSE, CAPILLARY
Glucose-Capillary: 176 mg/dL — ABNORMAL HIGH (ref 70–99)
Glucose-Capillary: 58 mg/dL — ABNORMAL LOW (ref 70–99)
Glucose-Capillary: 60 mg/dL — ABNORMAL LOW (ref 70–99)
Glucose-Capillary: 125 mg/dL — ABNORMAL HIGH (ref 70–99)

## 2012-01-16 MED ORDER — ALBUTEROL SULFATE (5 MG/ML) 0.5% IN NEBU
2.5000 mg | INHALATION_SOLUTION | Freq: Four times a day (QID) | RESPIRATORY_TRACT | Status: DC | PRN
  Administered 2012-01-18 – 2012-01-20 (×2): 2.5 mg via RESPIRATORY_TRACT
  Filled 2012-01-16: qty 0.5

## 2012-01-16 MED ORDER — ALBUTEROL SULFATE (5 MG/ML) 0.5% IN NEBU: 2.5000 mg | INHALATION_SOLUTION | Freq: Four times a day (QID) | RESPIRATORY_TRACT | Status: DC | PRN

## 2012-01-16 MED ORDER — INSULIN ASPART 100 UNIT/ML ~~LOC~~ SOLN
0.0000 [IU] | Freq: Three times a day (TID) | SUBCUTANEOUS | Status: DC
  Administered 2012-01-16: 2 [IU] via SUBCUTANEOUS
  Administered 2012-01-17 – 2012-01-18 (×4): 1 [IU] via SUBCUTANEOUS
  Administered 2012-01-19: 2 [IU] via SUBCUTANEOUS
  Administered 2012-01-19: 1 [IU] via SUBCUTANEOUS
  Administered 2012-01-19 – 2012-01-20 (×2): 2 [IU] via SUBCUTANEOUS
  Administered 2012-01-22: 3 [IU] via SUBCUTANEOUS
  Administered 2012-01-22: 5 [IU] via SUBCUTANEOUS

## 2012-01-16 MED ORDER — DEXTROSE 50 % IV SOLN
INTRAVENOUS | Status: AC
  Administered 2012-01-16: 50 mL
  Filled 2012-01-16: qty 50

## 2012-01-16 MED ORDER — GLUCOSE 40 % PO GEL
ORAL | Status: AC
  Filled 2012-01-16: qty 1

## 2012-01-16 MED ORDER — GLUCOSE 40 % PO GEL
ORAL | Status: AC
  Administered 2012-01-16: 17:00:00
  Filled 2012-01-16: qty 1

## 2012-01-16 MED ORDER — IPRATROPIUM BROMIDE 0.02 % IN SOLN: 0.5000 mg | RESPIRATORY_TRACT | Status: DC | PRN

## 2012-01-16 MED ORDER — INSULIN ASPART 100 UNIT/ML ~~LOC~~ SOLN
0.0000 [IU] | Freq: Every day | SUBCUTANEOUS | Status: DC
  Administered 2012-01-17: 2 [IU] via SUBCUTANEOUS

## 2012-01-16 MED ORDER — GLUCOSE 40 % PO GEL
ORAL | Status: AC
  Administered 2012-01-16: 37.5 g
  Filled 2012-01-16: qty 1

## 2012-01-16 MED ORDER — IPRATROPIUM BROMIDE 0.02 % IN SOLN
0.5000 mg | Freq: Four times a day (QID) | RESPIRATORY_TRACT | Status: DC | PRN
  Administered 2012-01-18: 0.5 mg via RESPIRATORY_TRACT
  Filled 2012-01-16: qty 2.5

## 2012-01-16 NOTE — Consult Note (Addendum)
Reason for Consult: Dysphagia with Distal esophageal stricture Referring Physician: Family practice teaching service  Holly Kim is an 71 y.o. female.  HPI: Holly Kim is a pleasant 46 year woman with past history of asthma, GERD, COPD, CAD, hypertension who is admitted with midsternal chest pain and dysphagia.  She was swallowing a pill at nursing home when she started having choking sensation and pain. Barium swallow shows mild to moderate distal esophagus smooth narrowing and dysmotility with occasional esophageal spasm. Patient had a vomiting episode before having this choking sensation- but none after admission. She reports having severe reflux disease for many years and had EGD- doesn't remember the time. Denies any blood in stool or significant weight loss. Has 18 pounds documented weight loss since November 2012. Patient denies any dysphagia symptoms before this admission.   Past Medical History  Diagnosis Date  . PE (pulmonary embolism)   . DVT (deep venous thrombosis)   . Diabetes mellitus   . Asthma   . Hypertension   . CAD (coronary artery disease)     RCA ostial DES 9/11, Circumflex ostial PTCA only 09/11/11.  Marland Kitchen GERD (gastroesophageal reflux disease)   . COPD (chronic obstructive pulmonary disease)   . Obesity   . Tardive dyskinesia   . History of renal failure   . Depression   . Borderline personality disorder   . Anxiety   . Anemia     hx of blood transfusion x 3  . Arthritis   . Peripheral neuropathy     Past Surgical History  Procedure Date  . US echocardiography 2008  . Orif ankle fracture   . Appendectomy   . Cervical discectomy   . Ptca 06/2010  . Tonsillectomy   . Transthoracic echocardiogram 06/2010    Grade 1 diastolic, EF 55-60    Family History  Problem Relation Age of Onset  . Breast cancer      aunt  . Alcohol abuse Son   . Cancer Daughter   . Leukemia Father     deceased  . Alzheimer's disease Mother     deceased     Social History:  reports that she has been passively smoking.  She has never used smokeless tobacco. She reports that she does not drink alcohol or use illicit drugs.  Allergies:  Allergies  Allergen Reactions  . Ibuprofen     REACTION: "mouth swells up and I can't swallow good"  . Amlodipine Besylate Other (See Comments)    REACTION: hypotension  . Amoxicillin Itching  . Codeine Itching  . Cortisone Other (See Comments)    Pt states she gets "blood poisoning where they give me the shots of cortisone"  . Haloperidol Lactate Other (See Comments)    REACTION: My head gets places before my body  . Reglan     Shaking   . Naproxen Rash    Medications: I have reviewed the patient's current medications.  Results for orders placed during the hospital encounter of 01/13/12 (from the past 48 hour(s))  GLUCOSE, CAPILLARY     Status: Abnormal   Collection Time   01/14/12 12:09 PM      Component Value Range Comment   Glucose-Capillary 172 (*) 70 - 99 (mg/dL)    Comment 1 Documented in Chart      Comment 2 Notify RN     GLUCOSE, CAPILLARY     Status: Abnormal   Collection Time   01/14/12  6:01 PM      Component Value  Range Comment   Glucose-Capillary 211 (*) 70 - 99 (mg/dL)    Comment 1 Documented in Chart      Comment 2 Notify RN     GLUCOSE, CAPILLARY     Status: Abnormal   Collection Time   01/14/12  9:35 PM      Component Value Range Comment   Glucose-Capillary 279 (*) 70 - 99 (mg/dL)   GLUCOSE, CAPILLARY     Status: Abnormal   Collection Time   01/15/12  8:08 AM      Component Value Range Comment   Glucose-Capillary 246 (*) 70 - 99 (mg/dL)   GLUCOSE, CAPILLARY     Status: Abnormal   Collection Time   01/15/12 12:55 PM      Component Value Range Comment   Glucose-Capillary 128 (*) 70 - 99 (mg/dL)   GLUCOSE, CAPILLARY     Status: Abnormal   Collection Time   01/15/12  4:15 PM      Component Value Range Comment   Glucose-Capillary 42 (*) 70 - 99 (mg/dL)   GLUCOSE, CAPILLARY      Status: Abnormal   Collection Time   01/15/12  5:15 PM      Component Value Range Comment   Glucose-Capillary 68 (*) 70 - 99 (mg/dL)   GLUCOSE, CAPILLARY     Status: Abnormal   Collection Time   01/15/12  6:13 PM      Component Value Range Comment   Glucose-Capillary 114 (*) 70 - 99 (mg/dL)   GLUCOSE, CAPILLARY     Status: Abnormal   Collection Time   01/15/12 10:12 PM      Component Value Range Comment   Glucose-Capillary 165 (*) 70 - 99 (mg/dL)   GLUCOSE, CAPILLARY     Status: Abnormal   Collection Time   01/16/12  8:10 AM      Component Value Range Comment   Glucose-Capillary 161 (*) 70 - 99 (mg/dL)     Dg Esophagus  10/18/7827  *RADIOLOGY REPORT*  Clinical Data: Dysphagia.  ESOPHOGRAM/BARIUM SWALLOW  Technique:  Single contrast examination was performed using thin barium.  Fluoroscopy time:  1.6 minutes.  Comparison:  None.  Findings:  Fluoroscopic evaluation of swallowing demonstrates disruption of 3/5 primary esophageal peristaltic waves.  Diffuse occasional tertiary contractions/spasm.  Within the distal esophagus, there is an area of mild to moderate short segment narrowing which appears smooth just above the GE junction.  This could reflect a peptic stricture.  No areas of fold thickening.  IMPRESSION: Mild persistent narrowing within the distal esophagus, question peptic stricture.  Dysmotility.  Occasional esophageal spasm.  Original Report Authenticated By: Cyndie Chime, M.D.    ROS:  As stated above in the HPI otherwise negative.  Blood pressure 101/52, pulse 73, temperature 98.2 F (36.8 C), temperature source Oral, resp. rate 18, height 5\' 4"  (1.626 m), weight 123.8 kg (272 lb 14.9 oz), SpO2 96.00%.  PE: Gen: A mild distress. Alert and Oriented. Coughing intermittently. HEENT:  [place]/AT, EOMI Neck: Supple, no LAD Lungs: Mild diffuse wheezes bilaterally. Good air entry. CV: RRR without M/G/R ABM: Obese, Soft, NTND, +BS Ext: No C/C/E  Assessment/Plan: 1) Dysphagia: Most  likely from mild to moderate esophageal smooth narrowing seen on barium swallow. Likely due to long-standing GERD and peptic stricture. Cancer low on differential due to smooth narrowing. - Upper endoscopy with possible dilation tomorrow. - Continue PPI. - Will discuss with Dr. Elnoria Howard for further management.  2) Hypertension: Blood pressure on lower side. -  Management per primary.   PATEL,RAVI 01/16/2012, 11:41 AM    Further evaluation and treatment will be pursed with an EGD tomorrow.  I reviewed the patient's medications and she is on Plavix and ASA.  I need the patient off of Plavix in order to perform the EGD with dilation.  She states that her cardiologist is with [facility].  Please obtain cardiac clearance to hold Plavix.

## 2012-01-16 NOTE — Progress Notes (Signed)
CBG: 60  Treatment: glucose gel  Symptoms: none  Follow-up CBG: [id number] CBG Result:58  Possible Reasons for Event: Comments/MD notified:on clears    Carmin Muskrat

## 2012-01-16 NOTE — Progress Notes (Signed)
01/16/2012 10:09 AM  Spoke with Rosey Bath with infection prevention regarding scabies and proper isolation precautions.  Per ED nurses' note upon arrival to the ED, pt had already been treated for scabies at the nursing home.  Spoke and clarified with infection prevention that since patient had already received treatment prior to coming in, isolation precautions were not necessary at this time.  Will continue to monitor. Eunice Blase

## 2012-01-16 NOTE — Progress Notes (Signed)
RT assessed patient and changed treatment to PRN per her Home regimen at SNF.  Patient stated that she has not been short of breath and her breath sounds have been diminished and clear.  RT will monitor for changed. Made patient aware of schedule change.

## 2012-01-16 NOTE — Progress Notes (Signed)
Daily Progress Note [name]. Holly Kim, M.D., M.B.A  Family Medicine PGY-1 Pager [phone]  Subjective: Yesterday patient has hypoglycemic event that MD were not made aware of by the nursing staff.  It was treated with dextrose gel and resolved. Since that time the insulin regimen has been changed.   This AM the patient reports choking, not being able to get enough air. It is not clearly associated with eating. She has been eating all of her meals. She also asks for pain medicine.   Objective: Vital signs in last 24 hours: Temp:  [98.2 F (36.8 C)-100.1 F (37.8 C)] 98.2 F (36.8 C) (04/04 1000) Pulse Rate:  [63-73] 73  (04/04 1000) Resp:  [18-20] 18  (04/04 1000) BP: (98-111)/(44-57) 101/52 mmHg (04/04 1000) SpO2:  [96 %-98 %] 96 % (04/04 1000) Weight:  [272 lb 14.9 oz (123.8 kg)] 272 lb 14.9 oz (123.8 kg) (04/03 2237) Weight change: 0 lb (0 kg) Last BM Date: 01/15/12  Gen: sleeping when I entered room, awoke and told me of choking sensation, very ill appearing, obese body habitus HEENT: edentulous, tardive dyskinesia Cardiac: RRR, no murmurs Lungs: rhonchi, diffuse wheezes  Abd: large panus, NABS, no guarding, tenderness to palpation of RUQ Extremities: 2+ edema bilaterally, exquisite tenderness distal to knees bilat   Lab Results:  Basename 01/14/12 0550 01/13/12 2244  WBC 9.4 9.4  HGB 8.2* 8.3*  HCT 26.7* 27.0*  PLT 159 162   BMET  Basename 01/14/12 0550 01/13/12 2244  NA 139 138  K 4.7 4.8  CL 96 95*  CO2 37* 37*  GLUCOSE 142* 218*  BUN 29* 30*  CREATININE 1.16* 1.28*  CALCIUM 9.3 9.2    Studies/Results:  Medications: I have reviewed the patient's current medications.  Assessment/Plan: 71 year old female with CAD, COPD, DM, morbid obesity, and chronic RUQ pain who presented with worsening epigastric pain and a "choking sensation" that is likely from esophageal narrowing noted on esophagram. Additionally, the patient had an episode of hypoglycemia to 42  mg/dL  1. Cardiac - not cardiac pain as trop I neg x 2  - continue home meds: ASA, plavix, bisoprolol, torsemide  2. Pulm - no evidence of acute cardiopulmonary issues on CXR, stable COPD  - continue albuterol  3. GI - consult GI today for recommendations of esophageal narrowing - GI was not able to see the patient on 4/3, but will hopefully make it today  - consider adding CCB to relax the lower esophagus, but follow recs of GI  - continue carafate, MS contin BID  - added oxycodone 10mg  PRN severe pain  4. DM - s/p Hypoglycemia, therfore, the patient now only on sensitive sliding scale and Lantus QHS; It is much safer for her CBG to run high than risk hypoglycemia, so we will not be aggressive with CBG control.   6. FEN - MIVF, clear liquid diet until further GI rec  7. Dispo - d/c to nursing home when clinically stable   LOS: 3 days   Mat Carne 01/16/2012, 12:55 PM

## 2012-01-16 NOTE — Progress Notes (Signed)
Inpatient Diabetes Program Recommendations  AACE/ADA: New Consensus Statement on Inpatient Glycemic Control (2009)  Target Ranges:  Prepandial:   less than 140 mg/dL      Peak postprandial:   less than 180 mg/dL (1-2 hours)      Critically ill patients:  140 - 180 mg/dL   Reason for Visit: Meal coverage discontinued with addition of correction Inpatient Diabetes Program Recommendations Correction (SSI): xxxxx Insulin - Meal Coverage: Pt getting clear liquids which do have carbohydrate content.  Continue meal coverage. Requested addition of sensitive correction tidwc and HS correction in addition to existing meal coverage orders.  However, meal coverage was discontinued and was working fairly well, just needing correction at times. Please add back in the meal coverage that was active yesterday.  Pt only gets the meal coverage if she eats at least 50% of meal.  Note: Thank you, Lenor Coffin, RN, CNS, Diabetes Coordinator [phone])

## 2012-01-16 NOTE — Progress Notes (Signed)
Spoke with Dr. Shirlee Latch in cardiology regarding Ms. Koeppen's plavix.  It is okay to hold plavix for a few days, but it will need to be restarted as soon as possible following the procedure.  BOOTH, Genessa Beman 01/16/2012, 10:21 PM

## 2012-01-16 NOTE — Progress Notes (Signed)
CBG: 58  Treatment:  d50  Symptoms:none  Follow-up CBG: Time:2220 CBG Result:125  Possible Reasons for Event:on clears  Comments/MD notified: per protocol    Carmin Muskrat

## 2012-01-16 NOTE — Progress Notes (Signed)
I have seen and examined this patient. I have discussed with Dr [name].  I agree with their findings and plans as documented in their progress note for today. Patient resting comfortably until I introduced myself, then became tachypnic and complained of choking. Wheezing on exam is transmitted from tracheolaryngeal area.  Await EGD evaluation of possible esophageal stricture then anticipate discharge home.

## 2012-01-16 NOTE — Progress Notes (Signed)
Patient with asymptomatic hypoglycemic event last night to 42. Discontinued novolog 16 with breakfast, 20 with lunch and 24 with dinner in light of event and poor PO intake. Will continue sensitive SSI q AC and HS along with Lantus 50 q D.  Hy Swiatek 9:37 AM, 01/16/12

## 2012-01-16 NOTE — Progress Notes (Signed)
Utilization review completed. Dirk Vanaman, RN, BSN. 01/16/12  

## 2012-01-17 ENCOUNTER — Encounter (HOSPITAL_COMMUNITY)
Admission: EM | Disposition: A | Discharge status: Skilled Nursing Facility | Payer: Self-pay | Source: Home / Self Care | Attending: Emergency Medicine

## 2012-01-17 LAB — GLUCOSE, CAPILLARY
Glucose-Capillary: 123 mg/dL — ABNORMAL HIGH (ref 70–99)
Glucose-Capillary: 142 mg/dL — ABNORMAL HIGH (ref 70–99)

## 2012-01-17 SURGERY — EGD (ESOPHAGOGASTRODUODENOSCOPY)
Anesthesia: Moderate Sedation

## 2012-01-17 MED ORDER — LISINOPRIL 2.5 MG PO TABS
2.5000 mg | ORAL_TABLET | Freq: Every day | ORAL | Status: DC
  Administered 2012-01-18 – 2012-01-22 (×5): 2.5 mg via ORAL
  Filled 2012-01-17 (×6): qty 1

## 2012-01-17 NOTE — Progress Notes (Signed)
Daily Progress Note [name]. Holly Kim, M.D., M.B.A  Family Medicine PGY-1 Pager [phone]  Subjective: Patient is scared of the procedure and is concerned that it is going to hurt; However, she states that her chest still hurts in the substernal area. Her son is in the room with her and asks if the patient will be have to procedure today. I state that I don't know what the GI physician is comfortable doing regarding the plavix.   Objective: Vital signs in last 24 hours: Temp:  [97.8 F (36.6 C)-98.3 F (36.8 C)] 98.3 F (36.8 C) (04/05 0945) Pulse Rate:  [58-70] 58  (04/05 0945) Resp:  [18-20] 20  (04/05 0945) BP: (90-123)/(41-73) 107/59 mmHg (04/05 0945) SpO2:  [97 %-100 %] 100 % (04/05 0945) Weight:  [261 lb 0.4 oz (118.4 kg)] 261 lb 0.4 oz (118.4 kg) (04/04 2259) Weight change: -11 lb 14.5 oz (-5.4 kg) Last BM Date: 01/15/12  Gen: awake, mildly distressed, ill appearing, obese body habitus HEENT: edentulous, tardive dyskinesia, hypertrichosis  Cardiac: RRR, no murmurs Lungs: rhonchi, diffuse wheezes  Abd: large panus, NABS, no guarding, tenderness to palpation of RUQ Extremities: 2+ edema bilaterally, exquisite tenderness distal to knees bilat   Lab Results: No results found for this basename: WBC:2,HGB:2,HCT:2,PLT:2 in the last 72 hours BMET No results found for this basename: NA:2,K:2,CL:2,CO2:2,GLUCOSE:2,BUN:2,CREATININE:2,CALCIUM:2 in the last 72 hours  Studies/Results:  Medications: I have reviewed the patient's current medications.  Assessment/Plan: 71 year old female with CAD, COPD, DM, morbid obesity, and chronic RUQ pain who presented with worsening epigastric pain that is likely from a stricture of the distal esophagus. However, she has signigicant chronic abdominal pain at baseline.   1. Cardiac - not cardiac pain as trop I neg x 2  - HOLD PLAVIX for procedure  - Decreased lisinopril given BP of 90/41 this AM  - Continue aspirin and bisoprolol   2. Pulm  - no evidence of acute cardiopulmonary issues on CXR, stable COPD  - continue albuterol  3. GI - GI physician will perform EGD to evaluated stricture, however, the patient has only been off plavix for one day, so I'm not sure when this procedure will take place; patient currently NPO, so if procedure not on 4/5, then restart clear liquid diet   - continue carafate, MS contin BID, oxycodone 10mg  PRN severe pain  4. DM - s/p Hypoglycemia, therfore, the patient now only on sensitive sliding scale and Lantus QHS; It is much safer for her CBG to run high than risk hypoglycemia, so we will not be aggressive with CBG control.   6. FEN - IVF @ 125, NPO  7. Dispo - d/c to nursing home when clinically stable   LOS: 4 days   Mat Carne 01/17/2012, 12:27 PM

## 2012-01-17 NOTE — Progress Notes (Signed)
Clinical Child psychotherapist (CSW) informed awaiting a GI procedure therefore will most likely not be ready for dc today. CSW will inform facility and continue to follow.  [name], MSW, Theresia Majors [phone]

## 2012-01-17 NOTE — Progress Notes (Signed)
Subjective: Holly Kim is doing overall better. She still c/o chest pain. Has no vomiting.  Objective: Vital signs in last 24 hours: Temp:  [97.8 F (36.6 C)-98.3 F (36.8 C)] 98.3 F (36.8 C) (04/05 0945) Pulse Rate:  [58-63] 58  (04/05 0945) Resp:  [18-20] 20  (04/05 0945) BP: (90-123)/(41-73) 107/59 mmHg (04/05 0945) SpO2:  [97 %-100 %] 100 % (04/05 0945) Weight:  [118.4 kg (261 lb 0.4 oz)] 118.4 kg (261 lb 0.4 oz) (04/04 2259) Last BM Date: 01/15/12  Intake/Output from previous day: 04/04 0701 - 04/05 0700 In: 2760 [P.O.:1080; I.V.:1680] Out: 301 [Urine:300; Stool:1] Intake/Output this shift: Total I/O In: 240 [P.O.:240] Out: -   General: resting in bed, NAD HEENT: PERRL, EOMI, no scleral icterus Cardiac: RRR, no rubs, murmurs or gallops Pulm: clear to auscultation bilaterally, moving normal volumes of air Abd: soft, nontender, nondistended, BS present Ext: warm and well perfused, no pedal edema Neuro: alert and oriented X3, cranial nerves II-XII grossly intact   Medications: I have reviewed the patient's current medications.  Assessment/Plan:  # Dysphagia:  From mild to moderate esophageal smooth narrowing seen on barium swallow.  Likely peptic Stricture. - Delayed Upper endoscopy with possible dilation on Tuesday as pt was on Plavix- need to hold for 5 days. - Discussed this with Primary team and pt and her son. - Continue PPI.      LOS: 4 days   Holly Kim 01/17/2012, 3:18 PM

## 2012-01-17 NOTE — Progress Notes (Signed)
I have seen and examined this patient. I have discussed with Dr [name].  I agree with their findings and plans as documented in their progress note for today.  Await diagnostic and possibly therapeutic EGD once safely off Plavix.  Agree that patient is likely to return to hospital with chest pain/choking complaint.  Will keep patient inhouse until EGD performed this Tuesday.

## 2012-01-17 NOTE — Progress Notes (Signed)
Her Plavix will be held and then I will perform an EGD on Tuesday.  I do not feel that she would be reliable for outpatient follow up and management of her Plavix.  It will be prudent to keep her in the hospital until the efficacy of Plavix wears off.

## 2012-01-18 LAB — GLUCOSE, CAPILLARY
Glucose-Capillary: 137 mg/dL — ABNORMAL HIGH (ref 70–99)
Glucose-Capillary: 183 mg/dL — ABNORMAL HIGH (ref 70–99)

## 2012-01-18 MED ORDER — GI COCKTAIL ~~LOC~~
30.0000 mL | Freq: Once | ORAL | Status: AC
  Administered 2012-01-18: 30 mL via ORAL
  Filled 2012-01-18: qty 30

## 2012-01-18 MED ORDER — DOCUSATE SODIUM 100 MG PO CAPS
100.0000 mg | ORAL_CAPSULE | Freq: Two times a day (BID) | ORAL | Status: DC
  Administered 2012-01-18 – 2012-01-22 (×8): 100 mg via ORAL
  Filled 2012-01-18 (×12): qty 1

## 2012-01-18 MED ORDER — SENNOSIDES 8.6 MG PO TABS
3.0000 | ORAL_TABLET | Freq: Every day | ORAL | Status: DC
  Administered 2012-01-18 – 2012-01-22 (×5): 25.8 mg via ORAL
  Filled 2012-01-18 (×5): qty 3

## 2012-01-18 NOTE — Progress Notes (Signed)
Subjective: Patient was resting comfortably in the exam bed this morning when I woke her up.  She noted that she's been having some shortness of breath with wheezing and mild chest pain since Sunday (6 days).  The quality or quantity of her pain has not changed.  She denies any palpitations, however she did have a 4 beat run of asymptomatic ventricular tachycardia at around 3:00 in the morning last night.     Objective: Vital signs in last 24 hours: Temp:  [97 F (36.1 C)-98.3 F (36.8 C)] 98.1 F (36.7 C) (04/06 0500) Pulse Rate:  [54-75] 75  (04/06 0500) Resp:  [18-20] 20  (04/06 0500) BP: (99-112)/(54-71) 104/54 mmHg (04/06 0500) SpO2:  [98 %-100 %] 98 % (04/06 0500) Weight:  [268 lb 1.3 oz (121.6 kg)] 268 lb 1.3 oz (121.6 kg) (04/05 2100) Weight change: 7 lb 0.9 oz (3.2 kg) Last BM Date: 01/15/12  Intake/Output from previous day: 04/05 0701 - 04/06 0700 In: 3025 [P.O.:360; I.V.:2665] Out: -  Intake/Output this shift:    Exam:  BP 104/54  Pulse 75  Temp(Src) 98.1 F (36.7 C) (Oral)  Resp 20  Ht 5\' 4"  (1.626 m)  Wt 268 lb 1.3 oz (121.6 kg)  BMI 46.02 kg/m2  SpO2 98% Gen: awake, mildly distressed, ill appearing, obese body habitus  HEENT: edentulous, tardive dyskinesia, hypertrichosis  Cardiac: RRR, no murmurs  Lungs: rhonchi, diffuse wheezes  Abd: large panus, NABS, no guarding, tenderness to palpation of RUQ  Extremities: 2+ edema bilaterally, exquisite tenderness distal to knees bilat    Lab Results: No results found for this basename: WBC:2,HGB:2,HCT:2,PLT:2 in the last 72 hours BMET No results found for this basename: NA:2,K:2,CL:2,CO2:2,GLUCOSE:2,BUN:2,CREATININE:2,CALCIUM:2 in the last 72 hours  Studies/Results: No results found.  Medications:  I have reviewed the patient's current medications. Scheduled:   . antiseptic oral rinse  15 mL Mouth Rinse BID  . aspirin EC  81 mg Oral Daily  . bisoprolol  5 mg Oral Daily  . enoxaparin  40 mg Subcutaneous  Q24H  . insulin aspart  0-5 Units Subcutaneous QHS  . insulin aspart  0-9 Units Subcutaneous TID WC  . insulin glargine  50 Units Subcutaneous Daily  . lisinopril  2.5 mg Oral Daily  . morphine  15 mg Oral TID  . pantoprazole  40 mg Oral Q1200  . sucralfate  1 g Oral TID AC  . torsemide  20 mg Oral Daily  . DISCONTD: lisinopril  5 mg Oral Daily   Continuous:   . sodium chloride 125 mL/hr at 01/18/12 0045   ZOX:[id number], ipratropium, LORazepam, oxyCODONE, promethazine  Assessment/Plan: 71 year old female with CAD, COPD, DM, morbid obesity, and chronic RUQ pain who presented with worsening epigastric pain that is likely from a stricture of the distal esophagus. However, she has signigicant chronic abdominal pain at baseline.  1. Cardiac - not cardiac pain as trop I neg x 2 and EKG normal yesterday.  4 beat run of asymptomatic V. tach x1 on monitor this morning is not significant. We will continue to follow in his ectopy continues to increase we'll re-cycle cardiac enzymes and reobtain a EKG - HOLD PLAVIX for procedure  - Decreased lisinopril given BP of 90/41 this AM  - Continue aspirin and bisoprolol  2. Pulm - no evidence of acute cardiopulmonary issues on CXR, stable COPD  - continue albuterol as needed  3. GI - GI physician will perform EGD to evaluated stricture, however, the patient has only been  off plavix one day. Dilation planned for Tuesday April 9th. We'll continue a thin liquid diet per speech therapy recommendations. - continue carafate, MS contin BID, oxycodone 10mg  PRN severe pain  4. DM - s/p Hypoglycemia, therfore, the patient now only on sensitive sliding scale and Lantus QHS; It is much safer for her CBG to run high than risk hypoglycemia, so we will not be aggressive with CBG control.  6. FEN - IVF @ 125, NPO  7. Dispo - d/c to nursing home when clinically stable   LOS: 5 days   COREY,EVAN 01/18/2012, 7:44 AM  (Addendum to correct physical exam, accidentally  closed it before I corrected the physical exam)

## 2012-01-18 NOTE — Progress Notes (Signed)
Responded to page that patient reporting sensation of "passing out".  RN evaluated patient and found that her O2 saturations dropped to 88-91 on 4L [place].  She is hemodynamically stable with BP 129/64 adn HR 64. RN gave an albuterol treatment, morphine x 1 and ativan x 1 as patient was also complaining of epigastric pain and feeling SOB.  When I evaluated patient she appeared tachypneic, she grabbed her chest and expressed pain. She was able to speak in full sentences. Supplemental O2 up to 5 L via [place]. Her exam is unchanged except for increased wheezing.  Plan monitor on increased O2 via [place]. Continue current management with albuterol, morphine and ativan. Will give GI cocktail x 1 as patient has known esophageal stricture.   [name] 4:44 PM, 01/18/12

## 2012-01-18 NOTE — Progress Notes (Signed)
Patient had a four beat run of VT. Pt  Was asleep. Vitals WNL. MD on call made aware.

## 2012-01-18 NOTE — Progress Notes (Signed)
Patient calm, resting quietly at present. No acute distress noted.

## 2012-01-18 NOTE — Progress Notes (Addendum)
Patient called out and asked for someone to lay her head back, patient sitting straight up in bed. Patient stated she felt short of breath and like she was going to pass out.O2 sats 88-90 % on 4LPM via [place]. Breathing treatment admin prn per order. Sats 88-91% on 4LPM. Patient very anxious. Ativan 1mg  given. Patient complains of chest pain and states she feels she cant catch her breath. Md paged to make aware of above findings, stated she will come by to check on patient. Patient oxygen increased to 5LPM, Md aware.

## 2012-01-18 NOTE — Progress Notes (Signed)
I interviewed and examined this patient and discussed the care plan with Dr. Denyse Amass and the [facility] team and agree with assessment and plan as documented in the progress note for today.    Kirby Cortese A. Sheffield Slider, MD Family Medicine Teaching Service Attending  01/18/2012 10:56 AM

## 2012-01-18 NOTE — Progress Notes (Signed)
Patient complaint of constipation. Up to [facility], small hard bowel movement noted. Md paged. Patient states she was taking senokot and colace at nursing facility.

## 2012-01-18 NOTE — Progress Notes (Signed)
Pt refusing Lovenox

## 2012-01-19 LAB — CBC
HCT: 25.4 % — ABNORMAL LOW (ref 36.0–46.0)
Hemoglobin: 7.8 g/dL — ABNORMAL LOW (ref 12.0–15.0)
MCV: 82.2 fL (ref 78.0–100.0)
Platelets: 152 10*3/uL (ref 150–400)
RBC: 3.09 MIL/uL — ABNORMAL LOW (ref 3.87–5.11)
WBC: 8.1 10*3/uL (ref 4.0–10.5)

## 2012-01-19 LAB — COMPREHENSIVE METABOLIC PANEL
Albumin: 2.3 g/dL — ABNORMAL LOW (ref 3.5–5.2)
BUN: 15 mg/dL (ref 6–23)
Creatinine, Ser: 1 mg/dL (ref 0.50–1.10)
Total Protein: 6.1 g/dL (ref 6.0–8.3)

## 2012-01-19 LAB — GLUCOSE, CAPILLARY
Glucose-Capillary: 151 mg/dL — ABNORMAL HIGH (ref 70–99)
Glucose-Capillary: 175 mg/dL — ABNORMAL HIGH (ref 70–99)
Glucose-Capillary: 128 mg/dL — ABNORMAL HIGH (ref 70–99)
Glucose-Capillary: 107 mg/dL — ABNORMAL HIGH (ref 70–99)

## 2012-01-19 MED ORDER — OXYCODONE HCL 5 MG PO TABS
5.0000 mg | ORAL_TABLET | Freq: Once | ORAL | Status: AC
  Administered 2012-01-19: 5 mg via ORAL
  Filled 2012-01-19: qty 1

## 2012-01-19 MED ORDER — ONDANSETRON 4 MG PO TBDP
4.0000 mg | ORAL_TABLET | Freq: Three times a day (TID) | ORAL | Status: DC | PRN
  Filled 2012-01-19: qty 1

## 2012-01-19 NOTE — Progress Notes (Addendum)
Family Medicine Teaching Service Note  Subjective: Patient was sitting up in bed eating breakfast.  She says she is still having abdominal epigastric pain and the sensation of "strangling."  She denies vomiting but endorses nausea.  She says she was feeling bad last night and they had to call the other doctor to come see her.  She is not specific about what "feeling bad" means.    Objective: Vital signs in last 24 hours: Temp:  [97.4 F (36.3 C)-98.9 F (37.2 C)] 98 F (36.7 C) (04/07 0415) Pulse Rate:  [56-64] 56  (04/07 0415) Resp:  [18-23] 23  (04/07 0415) BP: (108-159)/(55-74) 159/65 mmHg (04/07 0415) SpO2:  [90 %-100 %] 98 % (04/07 0415) Weight:  [269 lb 9.6 oz (122.29 kg)] 269 lb 9.6 oz (122.29 kg) (04/06 2332) Weight change: 1 lb 8.3 oz (0.69 kg) Last BM Date: 01/17/12  Intake/Output from previous day: 04/06 0701 - 04/07 0700 In: 1020 [P.O.:1020] Out: -   Exam:  BP 159/65  Pulse 56  Temp(Src) 98 F (36.7 C) (Oral)  Resp 23  Ht 5\' 4"  (1.626 m)  Wt 269 lb 9.6 oz (122.29 kg)  BMI 46.28 kg/m2  SpO2 98% Gen: awake, mildly distressed, ill appearing, obese body habitus  HEENT: edentulous, tardive dyskinesia, hypertrichosis  Cardiac: RRR, no murmurs  Lungs:upper airway sounds, diffuse wheezes, good air movement.  Abd: large panus, NABS, no guarding, tenderness to palpation of RUQ  Extremities: 2+ edema bilaterally, exquisite tenderness distal to knees bilat  Lab Results:  [facility] 01/19/12 0512  WBC 8.1  HGB 7.8*  HCT 25.4*  PLT 152   BMET  Basename 01/19/12 0512  NA 132*  K 4.5  CL 93*  CO2 30  GLUCOSE 143*  BUN 15  CREATININE 1.00  CALCIUM 8.6    Studies/Results: No results found.  Medications:  I have reviewed the patient's current medications. Scheduled:    . antiseptic oral rinse  15 mL Mouth Rinse BID  . aspirin EC  81 mg Oral Daily  . bisoprolol  5 mg Oral Daily  . docusate sodium  100 mg Oral BID  . enoxaparin  40 mg Subcutaneous Q24H    . gi cocktail  30 mL Oral Once  . insulin aspart  0-5 Units Subcutaneous QHS  . insulin aspart  0-9 Units Subcutaneous TID WC  . insulin glargine  50 Units Subcutaneous Daily  . lisinopril  2.5 mg Oral Daily  . morphine  15 mg Oral TID  . pantoprazole  40 mg Oral Q1200  . senna  3 tablet Oral Daily  . sucralfate  1 g Oral TID AC  . torsemide  20 mg Oral Daily   Continuous:    . sodium chloride 125 mL/hr at 01/19/12 0258   ZOX:[id number], ipratropium, LORazepam, oxyCODONE, promethazine  Assessment/Plan: 71 year old female with CAD, COPD, DM, morbid obesity, and chronic RUQ pain who presented with worsening epigastric pain that is likely from a stricture of the distal esophagus. However, she has signigicant chronic abdominal pain at baseline.  1. Cardiac - not cardiac pain as trop I neg x 2 and EKG normal yesterday.  4 beat run of asymptomatic V. tach x1 on monitor yesterday is not significant.  - HOLD PLAVIX for procedure  - Continue aspirin and bisoprolol, and lower dose of Lisinopril 2. Pulm - no evidence of acute cardiopulmonary issues on CXR, stable COPD  - continue albuterol as needed  3. GI - GI physician will perform EGD  to evaluated stricture, however must be of Plavix 5 days. Dilation planned for Tuesday April 9th. We'll continue a thin liquid diet per speech therapy recommendations. - continue carafate, MS contin BID, oxycodone 10mg  PRN severe pain  4. DM -sensitive sliding scale and Lantus QHS; It is much safer for her CBG to run high than risk hypoglycemia, so we will not be aggressive with CBG control.  5. Psych- patient with anxiety overnight, however this is not unusual for her.  Will give small dose of ativan PRN and continue to reassure and re-direct her.  6. FEN - IVF @ 125, liquid diet as tolerated.  Will add zofran prn. Re-check bmet in am.  7. Dispo - d/c to nursing home when clinically stable   LOS: 6 days   Roan Miklos 01/19/2012, 8:26 AM

## 2012-01-19 NOTE — Progress Notes (Signed)
I interviewed and examined this patient and discussed the care plan with Dr. Lula Olszewski and agree with assessment and plan as documented in the progress note for today.     Holly Kim A. Sheffield Slider, MD Family Medicine Teaching Service Attending  01/19/2012 1:57 PM

## 2012-01-19 NOTE — Progress Notes (Signed)
Nurse requested a chaplain visit for pt.  Pt was anxious and concerned about her prognosis, her family, and dying.  I provided spiritual and emotional support.  Pt asked me to pray for her and her family, which I did.  Please page me if needed or requested.  01/18/12 2359  Clinical Encounter Type  Visited With Patient  Visit Type Spiritual support  Referral From Nurse  Spiritual Encounters  Spiritual Needs Grief support;Emotional;Prayer  Stress Factors  Patient Stress Factors Major life changes;Loss of control;Family relationships;Health changes   [facility]  [phone]

## 2012-01-19 NOTE — Progress Notes (Signed)
Came and evaluated patient after fall to her knees earlier this afternoon.  She has a small abrasion on her left knee, which she says is painful.  When I arrived in her room, she was sleeping comfortably.   Knee: Superficial abrasion on knee.  Otherwise normal to inspection with no erythema or effusion or obvious bony abnormalities. Palpation normal with no warmth or joint line tenderness or patellar tenderness or condyle tenderness. Ligaments with solid consistent endpoints including ACL, PCL, LCL, MCL.  Do not feel patient likely to have fractured any bones involved in knee joint- and no swelling right now so unlikely ligamentous injury.    Will give extra dose of oxycodone and continue to monitor.  Fall precautions and out of bed with assistance orders placed.

## 2012-01-19 NOTE — Progress Notes (Addendum)
At 320PM, patient had been up to [facility] with assistance of nurse tech, requiring assistance x 1 person. Nurse tech to assist patient back to bed and patient stood up unassisted with one hand on bed rail, the other on BSC rail, patient had turned around with the direction from tech so she could sit back down on bed, when patient went down to floor on her knees. Upon entering room, patient kneeling next to bed, with arms propped on a chair. Tech still at bedside. Patient rolled onto backside so staff could use lift. Mechanical lift used to get patient back to bed. ROM performed to all extremities. Patient initially denied having any pain to extremities, c/o chest pain and requesting her ativan and pain medication, which patient has a hx of. Patient then complaint of left knee soreness. Md was notified and stated it was okay to give her medications at that time, which would be about 30 minutes early for pain meds and 1 hour early for her anxiety medication. Ice pack applied to left knee. Patient also has a small abrasion to her right great toe. Area cleansed and silicone dressing applied. Family was notified as well. Daughter stated mother has had falls in the past before, some of which had resulted in previous fractures.

## 2012-01-20 ENCOUNTER — Observation Stay (HOSPITAL_COMMUNITY): Payer: Medicare Other

## 2012-01-20 LAB — BASIC METABOLIC PANEL
GFR calc Af Amer: 56 mL/min — ABNORMAL LOW (ref >90)
GFR calc non Af Amer: 49 mL/min — ABNORMAL LOW (ref >90)
Potassium: 4.7 mEq/L (ref 3.5–5.1)
Sodium: 130 mEq/L — ABNORMAL LOW (ref 135–145)

## 2012-01-20 LAB — CBC
MCHC: 31.8 g/dL (ref 30.0–36.0)
Platelets: 159 10*3/uL (ref 150–400)
RDW: 13.8 % (ref 11.5–15.5)
WBC: 8.5 10*3/uL (ref 4.0–10.5)

## 2012-01-20 LAB — GLUCOSE, CAPILLARY: Glucose-Capillary: 114 mg/dL — ABNORMAL HIGH (ref 70–99)

## 2012-01-20 MED ORDER — INSULIN GLARGINE 100 UNIT/ML ~~LOC~~ SOLN
40.0000 [IU] | Freq: Every day | SUBCUTANEOUS | Status: DC
  Administered 2012-01-20: 40 [IU] via SUBCUTANEOUS

## 2012-01-20 MED ORDER — INSULIN GLARGINE 100 UNIT/ML ~~LOC~~ SOLN: 40.0000 [IU] | Freq: Every day | SUBCUTANEOUS | Status: DC

## 2012-01-20 MED ORDER — DEXTROSE 50 % IV SOLN: 25.0000 mL | Freq: Once | INTRAVENOUS | Status: AC | PRN

## 2012-01-20 MED ORDER — SODIUM CHLORIDE 0.9 % IV SOLN
INTRAVENOUS | Status: DC
  Administered 2012-01-20: 11:00:00 via INTRAVENOUS

## 2012-01-20 MED ORDER — DEXTROSE 50 % IV SOLN
INTRAVENOUS | Status: AC
  Administered 2012-01-20: 10 mL
  Filled 2012-01-20: qty 50

## 2012-01-20 MED ORDER — DEXTROSE 5 % IV SOLN: INTRAVENOUS | Status: DC

## 2012-01-20 MED ORDER — GLUCAGON HCL (RDNA) 1 MG IJ SOLR: 1.0000 mg | Freq: Once | INTRAMUSCULAR | Status: AC | PRN

## 2012-01-20 MED ORDER — DEXTROSE 50 % IV SOLN: 50.0000 mL | Freq: Once | INTRAVENOUS | Status: AC | PRN

## 2012-01-20 MED ORDER — GLUCAGON HCL (RDNA) 1 MG IJ SOLR: 0.5000 mg | Freq: Once | INTRAMUSCULAR | Status: AC | PRN

## 2012-01-20 NOTE — Progress Notes (Signed)
FMTS Attending Note Patient care discussed with resident team, I agree with Dr. Yetta Numbers detailed assessment and plain in today's note. Paula Compton, MD

## 2012-01-20 NOTE — Progress Notes (Signed)
This is a follow-up visit from Saturday night.  Pt is still anxious, afraid, and in pain.  She is afraid of death.  I offered spiritual and emotional support.  We ended the visit with prayer.  Pt thanked me for the visit and asked me to come again.  I will continue to follow.  Please page me if needed or requested. Dellie Catholic  [phone] personal pager   [phone]  On-call pager  01/20/12 1558  Clinical Encounter Type  Visited With Patient  Visit Type Follow-up  Spiritual Encounters  Spiritual Needs Grief support;Emotional;Prayer  Stress Factors  Patient Stress Factors Major life changes;Loss of control;Family relationships;Health changes;Loss  Family Stress Factors Not reviewed

## 2012-01-20 NOTE — Progress Notes (Signed)
Daily Progress Note Holly Kim. Holly Kim, M.D., M.B.A  Family Medicine PGY-1 Pager [phone]  Subjective: 1. Knee Pain, Bilateral - Patient with fall yesterday evening transferring to bedside commode, she fell on her knees bilaterally, was evaluated by Dr. Lula Olszewski who did not think she had an injury that warranted imaging at the time; still complaining of bilateral lower extremity pain - which she has at baseline   2. Hypoglycemia - 46 this AM, given dextrose and it improved to 89   3. Access - patient's nurses states that her PIV is not functioning well, IV team tried unsuccessfully for another one  4. GI - still feels like she is choking, decreased appetite    Objective: Vital signs in last 24 hours: Temp:  [97.2 F (36.2 C)-98.5 F (36.9 C)] 98.1 F (36.7 C) (04/08 1400) Pulse Rate:  [56-81] 60  (04/08 1400) Resp:  [22-23] 22  (04/08 1400) BP: (100-142)/(43-73) 139/69 mmHg (04/08 1400) SpO2:  [96 %-100 %] 100 % (04/08 1400) Weight change:  Last BM Date: 01/19/12  Intake/Output from previous day: 04/07 0701 - 04/08 0700 In: 4440 [P.O.:1440; I.V.:3000] Out: -  Intake/Output this shift: Total I/O In: 220 [P.O.:220] Out: -   Gen: awake, mildly distressed, ill appearing, obese body habitus  HEENT: edentulous, tardive dyskinesia, hypertrichosis  Cardiac: RRR, no murmurs  Lungs:upper airway sounds, diffuse wheezes, poor air movement.  Abd: large panus, NABS, no guarding, tenderness to palpation of RUQ  Extremities: 2+ edema bilaterally, exquisite tenderness to patella and distal to knees bilat, no obvious bony abnormality of LE   Lab Results:  [facility] 01/20/12 0508 01/19/12 0512  WBC 8.5 8.1  HGB 8.3* 7.8*  HCT 26.1* 25.4*  PLT 159 152   BMET  Basename 01/20/12 0508 01/19/12 0512  NA 130* 132*  K 4.7 4.5  CL 92* 93*  CO2 31 30  GLUCOSE 62* 143*  BUN 18 15  CREATININE 1.10 1.00  CALCIUM 8.7 8.6    Studies/Results: No results found.  Medications: I  have reviewed the patient's current medications.  Assessment/Plan: 71 year old female with CAD, COPD, DM, morbid obesity, and chronic RUQ pain who presented with worsening epigastric pain that is likely from a stricture of the distal esophagus. However, she has signigicant chronic abdominal pain at baseline.  1. Cardiac - not cardiac pain as trop I neg x 2 and EKG normal yesterday. 4 beat run of asymptomatic V. tach x1 on monitor yesterday is not significant.  - HOLD PLAVIX for procedure on 4/9 - Continue aspirin and bisoprolol, and lower dose of Lisinopril   2. Pulm - no evidence of acute cardiopulmonary issues on CXR, stable COPD  - continue albuterol as needed   3. GI - GI physician will perform EGD to evaluated stricture, however must be of Plavix 5 days. Dilation planned for Tuesday April 9th. We'll continue a thin liquid diet per speech therapy recommendations.  - continue carafate, MS contin BID, oxycodone 10mg  PRN severe pain   4. DM -sensitive sliding scale and Lantus QHS; It is much safer for her CBG to run high than risk hypoglycemia, so we will not be aggressive with CBG control.   - STOP night time sliding scale and decrease Lantus to 40 units daily  5. Psych- patient with anxiety overnight, however this is not unusual for her. Will give small dose of ativan PRN and continue to reassure and re-direct her.   6. Knee Pain - obtain X-rays given fall, severe pain, morbid  obesity, and hx of injuries   7. FEN - change IVF to normal saline, recheck BMET in AM   8. Dispo - d/c to nursing home when clinically stable     LOS: 7 days   Mat Carne 01/20/2012, 3:38 PM

## 2012-01-20 NOTE — Progress Notes (Signed)
Clinical Social Worker (CSW) observed MD note that pt is not stable for dc. CSW informed facility and will continue to follow and facilitate with dc when pt stable to return to [facility] skilled nursing facility.  [name], MSW, Theresia Majors [phone]

## 2012-01-21 ENCOUNTER — Encounter (HOSPITAL_COMMUNITY): Payer: Self-pay | Admitting: *Deleted

## 2012-01-21 ENCOUNTER — Encounter (HOSPITAL_COMMUNITY)
Admission: EM | Disposition: A | Discharge status: Skilled Nursing Facility | Payer: Self-pay | Source: Home / Self Care | Attending: Emergency Medicine

## 2012-01-21 HISTORY — PX: ESOPHAGOGASTRODUODENOSCOPY: SHX5428

## 2012-01-21 LAB — GLUCOSE, CAPILLARY
Glucose-Capillary: 38 mg/dL — CL (ref 70–99)
Glucose-Capillary: 40 mg/dL — CL (ref 70–99)

## 2012-01-21 LAB — BASIC METABOLIC PANEL
BUN: 19 mg/dL (ref 6–23)
CO2: 31 mEq/L (ref 19–32)
Chloride: 96 mEq/L (ref 96–112)
Creatinine, Ser: 1.12 mg/dL — ABNORMAL HIGH (ref 0.50–1.10)
Glucose, Bld: 39 mg/dL — CL (ref 70–99)

## 2012-01-21 SURGERY — EGD (ESOPHAGOGASTRODUODENOSCOPY)
Anesthesia: Moderate Sedation

## 2012-01-21 MED ORDER — ENSURE COMPLETE PO LIQD
237.0000 mL | Freq: Three times a day (TID) | ORAL | Status: DC
Start: 2012-01-21 — End: 2012-01-22
  Administered 2012-01-21 – 2012-01-22 (×3): 237 mL via ORAL

## 2012-01-21 MED ORDER — INSULIN GLARGINE 100 UNIT/ML ~~LOC~~ SOLN
10.0000 [IU] | Freq: Every day | SUBCUTANEOUS | Status: DC
  Administered 2012-01-21 – 2012-01-22 (×2): 10 [IU] via SUBCUTANEOUS

## 2012-01-21 MED ORDER — GLUCAGON HCL (RDNA) 1 MG IJ SOLR
INTRAMUSCULAR | Status: AC
  Administered 2012-01-21: 1 mg
  Filled 2012-01-21: qty 1

## 2012-01-21 MED ORDER — FENTANYL CITRATE 0.05 MG/ML IJ SOLN
INTRAMUSCULAR | Status: AC
  Filled 2012-01-21: qty 2

## 2012-01-21 MED ORDER — GLUCOSE 40 % PO GEL
ORAL | Status: AC
  Administered 2012-01-21: 37.5 g
  Filled 2012-01-21: qty 1

## 2012-01-21 MED ORDER — CLOPIDOGREL BISULFATE 75 MG PO TABS
75.0000 mg | ORAL_TABLET | Freq: Every day | ORAL | Status: DC
  Administered 2012-01-22: 75 mg via ORAL
  Filled 2012-01-21 (×2): qty 1

## 2012-01-21 MED ORDER — SODIUM CHLORIDE 0.9 % IV SOLN
INTRAVENOUS | Status: DC
  Administered 2012-01-21 – 2012-01-22 (×2): via INTRAVENOUS

## 2012-01-21 MED ORDER — BUTAMBEN-TETRACAINE-BENZOCAINE 2-2-14 % EX AERO
INHALATION_SPRAY | CUTANEOUS | Status: DC | PRN
  Administered 2012-01-21: 2 via TOPICAL

## 2012-01-21 MED ORDER — DEXTROSE-NACL 5-0.9 % IV SOLN
INTRAVENOUS | Status: DC
  Administered 2012-01-21: 10:00:00 via INTRAVENOUS

## 2012-01-21 MED ORDER — MIDAZOLAM HCL 10 MG/2ML IJ SOLN
INTRAMUSCULAR | Status: AC
  Filled 2012-01-21: qty 2

## 2012-01-21 NOTE — Progress Notes (Signed)
INITIAL ADULT NUTRITION ASSESSMENT Date: 01/21/2012   Time: 10:37 AM  Reason for Assessment: Prolonged NPO/Clear Liquid Diet  ASSESSMENT: Female 71 y.o.  Dx: Anxiety state, unspecified  Hx:  Past Medical History  Diagnosis Date  . PE (pulmonary embolism)   . DVT (deep venous thrombosis)   . Diabetes mellitus   . Asthma   . Hypertension   . CAD (coronary artery disease)     RCA ostial DES 9/11, Circumflex ostial PTCA only 09/11/11.  Marland Kitchen GERD (gastroesophageal reflux disease)   . COPD (chronic obstructive pulmonary disease)   . Obesity   . Tardive dyskinesia   . History of renal failure   . Depression   . Borderline personality disorder   . Anxiety   . Anemia     hx of blood transfusion x 3  . Arthritis   . Peripheral neuropathy    Past Surgical History  Procedure Date  . US echocardiography 2008  . Orif ankle fracture   . Appendectomy   . Cervical discectomy   . Ptca 06/2010  . Tonsillectomy   . Transthoracic echocardiogram 06/2010    Grade 1 diastolic, EF 55-60   Related Meds:     . antiseptic oral rinse  15 mL Mouth Rinse BID  . aspirin EC  81 mg Oral Daily  . bisoprolol  5 mg Oral Daily  . dextrose      . docusate sodium  100 mg Oral BID  . enoxaparin  40 mg Subcutaneous Q24H  . glucagon      . insulin aspart  0-9 Units Subcutaneous TID WC  . insulin glargine  10 Units Subcutaneous Daily  . lisinopril  2.5 mg Oral Daily  . morphine  15 mg Oral TID  . pantoprazole  40 mg Oral Q1200  . senna  3 tablet Oral Daily  . sucralfate  1 g Oral TID AC  . torsemide  20 mg Oral Daily  . DISCONTD: insulin glargine  40 Units Subcutaneous Daily  . DISCONTD: insulin glargine  40 Units Subcutaneous Daily   Ht: 5\' 4"  (162.6 cm)  Wt: 276 lb 14.4 oz (125.6 kg)  Ideal Wt: 54.7 kg  % Ideal Wt: 230%  Wt Readings from Last 20 Encounters:  01/20/12 276 lb 14.4 oz (125.6 kg)  01/20/12 276 lb 14.4 oz (125.6 kg)  01/20/12 276 lb 14.4 oz (125.6 kg)  11/15/11 269 lb (122.018  kg)  10/27/11 260 lb 4.8 oz (118.071 kg)  09/24/11 279 lb 15.8 oz (127 kg)  09/12/11 276 lb (125.193 kg)  09/12/11 276 lb (125.193 kg)  08/29/11 290 lb 3.2 oz (131.634 kg)  08/12/11 322 lb (146.058 kg)  08/01/11 285 lb 9.6 oz (129.547 kg)  07/26/11 277 lb (125.646 kg)  07/08/11 273 lb (123.832 kg)  06/24/11 272 lb (123.378 kg)  06/03/11 266 lb (120.657 kg)  04/22/11 272 lb (123.378 kg)  03/05/11 290 lb (131.543 kg)  11/22/10 284 lb (128.822 kg)  10/25/10 281 lb (127.461 kg)  10/04/10 282 lb (127.914 kg)  Usual Wt: 260 - 280 lb x 1.5 years % Usual Wt: 100%  Body mass index is 47.53 kg/(m^2). Pt meets criteria for Obesity Class III (Extreme)  Food/Nutrition Related Hx: limited intake at facility per son  Labs:  CMP     Component Value Date/Time   NA 133* 01/21/2012 0550   K 4.2 01/21/2012 0550   CL 96 01/21/2012 0550   CO2 31 01/21/2012 0550   GLUCOSE 39* 01/21/2012 0550  BUN 19 01/21/2012 0550   CREATININE 1.12* 01/21/2012 0550   CREATININE 1.16* 08/29/2011 1458   CALCIUM 8.7 01/21/2012 0550   PROT 6.1 01/19/2012 0512   ALBUMIN 2.3* 01/19/2012 0512   AST 12 01/19/2012 0512   ALT 9 01/19/2012 0512   ALKPHOS 63 01/19/2012 0512   BILITOT 0.2* 01/19/2012 0512   GFRNONAA 48* 01/21/2012 0550   GFRAA 55* 01/21/2012 0550   CBG (last 3)   Basename 01/21/12 0909 01/21/12 0813 01/21/12 0742  GLUCAP 92 40* 38*    Intake/Output: I/O last 3 completed shifts: In: 5353.7 [P.O.:1402; I.V.:3951.7] Out: 2 [Urine:1; Stool:1]    Diet Order: NPO  Supplements/Tube Feeding: none  IVF:    dextrose   dextrose 5 % and 0.9% NaCl Last Rate: 50 mL/hr at 01/21/12 1478  DISCONTD: sodium chloride Last Rate: 50 mL/hr at 01/21/12 0600   Estimated Nutritional Needs:   Kcal: 1750 - 1900 kcal Protein:  75 - 90 grams Fluid:  1.8 - 2 L/d  Past medical hx significant for borderline personality disorder, GERD, and depression. From [facility] and [facility]. Admitted with choking sensation/chest pain after  taking a pill. Symptoms associated with chronic n/v and decreased appetite.  Work-up revealed possible peptic stricture at GE junction. Barium swallow showed mild to moderate distal esophagus smooth narrowing and dysmotility with occasional esophageal spasm. BSE completed 4/2, per SLP: from a pharyngeal standpoint, pt can tolerate a Dys 3 mechanical soft diet with thin liquids though suspect pt with difficutly in esophageal transit of larger quantites of solids.   EGD planned for today, pt waiting for EGD as pt must be off Plavix for 5 days. Pt has been ordered NPO and clear liquids x 5 days of admission. Upgraded to full liquids on 4/5. Currently with persistently low CBGs.  Son reports pt has been at facility for 2 months and PO intake has been limited as pt doesn't always get foods that she desires. Per facility records, pt was receiving 2 scoops Beneprotein Powder PO BID at facility. Pt states she likes Ensure and is willing to drink this supplement while she is here to better meet her protein and kcal needs. Suspect some level of nutrition risk given hx of recent decline in PO intake, chronic conditions, and limited food acceptance at facility.  NUTRITION DIAGNOSIS: -Swallowing difficulty (NI-1.1).  Status: Ongoing  RELATED TO: mild to moderate distal esophagus smooth narrowing and dysmotility with occasional esophageal spasm  AS EVIDENCE BY: pt's signs/symptoms and scheduled EGD with possible distal esophageal stricture dilation.  MONITORING/EVALUATION(Goals): Goal: Pt to advance to diet as tolerated. Intake of meals to be 90% of estimated needs. Monitor: PO intake, weights, labs, I/O's  EDUCATION NEEDS: -Education not appropriate at this time  INTERVENTION: 1. Ensure Complete PO TID to help pt better meet protein and kcal needs 2. RD to continue to follow nutrition care plan  Dietitian #: [phone]  DOCUMENTATION CODES Per approved criteria  -Morbid Obesity    Adair Laundry 01/21/2012, 10:37 AM

## 2012-01-21 NOTE — Progress Notes (Signed)
FMTS Attending Note Patient seen and examined by me; discussed with Dr. [name].  Patient for EGD today with GI.  She is NPO for this, and has tenuous iv access. Plans to increase her ivf substantially, and reduce her insulin to prevent further hypoglycemic events.   L knee xray with evidence of patellar fx; will consult ortho regarding need for further management. Paula Compton, MD

## 2012-01-21 NOTE — Interval H&P Note (Signed)
History and Physical Interval Note:  01/21/2012 2:18 PM  Holly Kim  has presented today for surgery, with the diagnosis of Dysphagia and esophageal stricture  The various methods of treatment have been discussed with the patient and family. After consideration of risks, benefits and other options for treatment, the patient has consented to  Procedure(s) (LRB): ESOPHAGOGASTRODUODENOSCOPY (EGD) (N/A) as a surgical intervention .  The patients' history has been reviewed, patient examined, no change in status, stable for surgery.  I have reviewed the patients' chart and labs.  Questions were answered to the patient's satisfaction.     Nichelle Renwick D  Nursing notified me that she sounded "wet".  I examined the patient an auscultated her lungs anteriorly and posteriorly.  The source of her "wettness" is from her upper airway.  It appears to be posterior pharynx.  Pox is at 100% at 4 liters, which appears to be her baseline.  I will suction out her posterior pharynx and proceed with caution.  I will keep her in an upright supine position to minimize any aspiration issues.

## 2012-01-21 NOTE — Op Note (Signed)
Eligha Bridegroom [facility] [address] [place], [place]  [address]  OPERATIVE PROCEDURE REPORT  PATIENT:  Holly Kim, Holly Kim  MR#:  [id number] BIRTHDATE:  1941-04-25  GENDER:  female ENDOSCOPIST:  Jeani Hawking, MD ASSISTANT:  Rolanda Lundborg and Angelique Blonder PROCEDURE DATE:  01/21/2012 PROCEDURE:  EGD, diagnostic [phone] ASA CLASS:  Class III INDICATIONS:  Abnormal esophagram and dysphagia MEDICATIONS:  None TOPICAL ANESTHETIC:  Cetacaine Spray  DESCRIPTION OF PROCEDURE:   After the risks benefits and alternatives of the procedure were thoroughly explained, informed consent was obtained.  The Pentax Gastroscope [id number] endoscope was introduced through the mouth and advanced to the second portion of the duodenum, without limitations.  The instrument was slowly withdrawn as the mucosa was fully examined. <<PROCEDUREIMAGES>>  FINDINGS: The upper, middle, and distal third of the esophagus were carefully inspected and no abnormalities were noted. The z-line was well seen at the GEJ. No clear cut evidence of a stricture.  The endoscope was pushed into the fundus which was normal including a retroflexed view. The antrum,gastric body, first and second part of the duodenum were unremarkable. Retroflexed views revealed no abnormalities.    The scope was then withdrawn from the patient and the procedure terminated.  COMPLICATIONS:  None  IMPRESSION:  1) Normal EGD RECOMMENDATIONS:  1) Clear liquid diet and advance as tolerated. 2) Pending her tolerance a manometry may be required.  ______________________________ Jeani Hawking, MD  n. Rosalie DoctorJeani Hawking at 01/21/2012 02:42 PM  Daune Perch, [phone]

## 2012-01-21 NOTE — H&P (View-Only) (Signed)
Low CBGs noted. Patient with persistently low AM CBGs over the last few days despite reduction in Lantus and Novolog. CBGs: 47>89>168>114>83>39>40 >92. Patient has received glucose gel glucagon. Plan:decrease Lantus to 10 U. Continue low dose sliding scale insulin. Add D5NS @ 50 ml/hr while NPO for scheduled EGD with possible distal esophageal stricture dilation.   Naksh Radi 01/21/2012, 9:21 AM  

## 2012-01-21 NOTE — Progress Notes (Signed)
Attempted visit.  Pt was sleeping soundly.  Will continue to follow.  Please page me if I am needed. Kynsleigh Westendorf  [phone]    [phone] oncall pager

## 2012-01-21 NOTE — Progress Notes (Addendum)
Low CBGs noted. Patient with persistently low AM CBGs over the last few days despite reduction in Lantus and Novolog. CBGs: 47>89>168>114>83>39>40 >92. Patient has received glucose gel glucagon. Plan:decrease Lantus to 10 U. Continue low dose sliding scale insulin. Add D5NS @ 50 ml/hr while NPO for scheduled EGD with possible distal esophageal stricture dilation.   Gavyn Zoss 01/21/2012, 9:21 AM

## 2012-01-21 NOTE — Progress Notes (Signed)
Daily Progress Note [name]. Holly Kim, M.D., M.B.A  Family Medicine PGY-1 Pager [phone]  Subjective: 1. Knee Pain - still present predominantly on the left  2. Hypoglycemia - 38 this AM, given dextrose and it improved to 92 (this comes after decreasing Lantus to 40 units and eliminating nighttime sliding scale)  3. GI - still feels like she is choking; would like to eat but cannot tolerate tomato soup    Objective: Vital signs in last 24 hours: Temp:  [97.5 F (36.4 C)-98.6 F (37 C)] 97.5 F (36.4 C) (04/09 1000) Pulse Rate:  [56-61] 56  (04/09 1000) Resp:  [20-24] 20  (04/09 1000) BP: (104-139)/(52-92) 104/52 mmHg (04/09 1000) SpO2:  [98 %-100 %] 100 % (04/09 1000) Weight:  [276 lb 14.4 oz (125.6 kg)] 276 lb 14.4 oz (125.6 kg) (04/08 2053) Weight change:  Last BM Date: 01/19/12  Intake/Output from previous day: 04/08 0701 - 04/09 0700 In: 1873.7 [P.O.:922; I.V.:951.7] Out: 2 [Urine:1; Stool:1] Intake/Output this shift:    Gen: in and out of sleep, pale, mildly distressed, more ill appearing the previous days  HEENT: edentulous, tardive dyskinesia, hypertrichosis  Cardiac: RRR, no murmurs  Lungs:upper airway sounds, diffuse wheezes, poor air movement.  Abd: large panus, NABS, no guarding, tenderness to palpation of RUQ  Extremities: 2+ edema bilaterally, exquisite tenderness to patella and distal to knees bilat, no obvious bony abnormality of LE   Lab Results:  [facility] 01/20/12 0508 01/19/12 0512  WBC 8.5 8.1  HGB 8.3* 7.8*  HCT 26.1* 25.4*  PLT 159 152   BMET  Basename 01/21/12 0550 01/20/12 0508  NA 133* 130*  K 4.2 4.7  CL 96 92*  CO2 31 31  GLUCOSE 39* 62*  BUN 19 18  CREATININE 1.12* 1.10  CALCIUM 8.7 8.7    Studies/Results: Dg Knee 4 Views W/patella Left  01/20/2012  *RADIOLOGY REPORT*  Clinical Data: Fall.  Knee pain.  LEFT KNEE - COMPLETE 4+ VIEW  Comparison: 07/28/2010  Findings: Prepatellar subcutaneous edema is present, and subtle  oblique lucency in the patella is not readily apparent on the prior exam from 2011 and is suspicious for a nondisplaced patellar fracture.  A small effusion is present the suprapatellar bursa.  Severe patellofemoral spurring is present.  There is also prominent medial and lateral compartmental osteoarthritis.  Vascular calcifications noted.  Dystrophic calcifications are visible in the subcutaneous tissues.  IMPRESSION:  1.  Abnormal oblique lucency across the patella is suspicious for a nondisplaced fracture. 2.  Tri-compartmental osteoarthritis. 3.  Small knee effusion and prepatellar soft tissue swelling. 4.  Atherosclerosis.  Original Report Authenticated By: Dellia Cloud, M.D.    Medications: I have reviewed the patient's current medications.  Assessment/Plan: 71 year old female with CAD, COPD, DM, morbid obesity, and chronic RUQ pain who presented with worsening epigastric pain that is likely from a stricture of the distal esophagus. However, she has signigicant chronic abdominal pain at baseline.  1. Cardiac - not cardiac pain as trop I neg x 2 and EKG normal yesterday. 4 beat run of asymptomatic V. tach x1 on monitor yesterday is not significant.  - HOLD PLAVIX for procedure today 4/9 - Continue aspirin and bisoprolol, and lower dose of Lisinopril   2. Pulm - no evidence of acute cardiopulmonary issues on CXR, stable COPD  - continue albuterol as needed   3. GI - GI physician will perform EGD to evaluated stricture, however must be of Plavix 5 days. - continue carafate, MS  contin BID, oxycodone 10mg  PRN severe pain  - Planned EGD today; f/u with GI afterwards  4. DM - continuous hypoglycemic episodes 2/2 poor PO intake despite repeatedly lower insulin - Add d5 to fluids until NPO finished - Lantus 10U while NPO   5. Psych- patient with anxiety overnight, however this is not unusual for her. Will give small dose of ativan PRN and continue to reassure and re-direct her.   6.  Knee Pain - evidence of non-displaced patellar fracture on X-ray - Consult orthopedic surgery; place in knee immobilizer    7. FEN - Na+ improved, but patient clinically dehyrated; change D5 NS to 125 mL; change to 1/2 NS when able to tolerate PO again   8. Dispo - d/c to nursing home when clinically stable     LOS: 8 days   Holly Kim 01/21/2012, 12:58 PM

## 2012-01-22 ENCOUNTER — Encounter (HOSPITAL_COMMUNITY): Payer: Self-pay | Admitting: Gastroenterology

## 2012-01-22 DIAGNOSIS — E162 Hypoglycemia, unspecified: Secondary | ICD-10-CM | POA: Diagnosis not present

## 2012-01-22 DIAGNOSIS — S82035A Nondisplaced transverse fracture of left patella, initial encounter for closed fracture: Secondary | ICD-10-CM | POA: Diagnosis not present

## 2012-01-22 LAB — BASIC METABOLIC PANEL
BUN: 23 mg/dL (ref 6–23)
Calcium: 8.8 mg/dL (ref 8.4–10.5)
Creatinine, Ser: 1.33 mg/dL — ABNORMAL HIGH (ref 0.50–1.10)
GFR calc Af Amer: 45 mL/min — ABNORMAL LOW (ref >90)
GFR calc non Af Amer: 39 mL/min — ABNORMAL LOW (ref >90)

## 2012-01-22 MED ORDER — SODIUM CHLORIDE 0.9 % IV BOLUS (SEPSIS)
1000.0000 mL | Freq: Once | INTRAVENOUS | Status: AC
Start: 2012-01-22 — End: 2012-01-22
  Administered 2012-01-22: 1000 mL via INTRAVENOUS

## 2012-01-22 NOTE — Progress Notes (Signed)
Orthopedic Tech Progress Note Patient Details:  Holly Kim 1940-10-30 [id number]  Other Ortho Devices Type of Ortho Device: Knee Immobilizer Ortho Device Location: left LE Ortho Device Interventions: Application   [name] T 01/22/2012, 12:34 PM

## 2012-01-22 NOTE — Progress Notes (Signed)
Late Entry:  Clinical Child psychotherapist (CSW) informed pt ready for dc. CSW contacted and faxed dc summary to facility, placed dc packet in shadow chart and contacted [facility] for transport. CSW unable to inform pt as pt was asleep. No further needs addressed.  CSW signing off.  [name], MSW, Theresia Majors [phone]

## 2012-01-22 NOTE — Progress Notes (Signed)
Speech Language Pathology Dysphagia Treatment  Patient Details Name: Holly Kim MRN: [id number] DOB: [birth year] Today's Date: 01/22/2012  SLP Assessment/Plan/Recommendation Assessment / Recommendations / Plan Clinical Impression Statement: Pt now on regular diet with thin liquids following normal EGD. Observed and assisted pt with breakfast. Pt accepted one bite of each food with moderate cues required to encourage following solids with liquids. No signs of pharyngeal dysphagia observed though pt continues to demonstrate a delayed cough response, early satiety, and belching, likely resulting from pts diagnosed GERD. SLP again provided esophageal precautions to pt. Education is complete, pt consuming regular diet without overt aspiration. WIll defer further treatment to GI and MD. Please reorder if needed.  Continue with Current Diet: Regular;Thin liquid Liquids provided via: Straw Medication Administration: Whole meds with liquid Supervision: Patient able to self feed Compensations: Slow rate;Small sips/bites;Follow solids with liquid Postural Changes and/or Swallow Maneuvers: Seated upright 90 degrees;Upright 30-60 min after meal Plan: Discharge SLP treatment due to (comment) (no further skilled treatment needed. ) Swallowing Goals  SLP Swallowing Goals Patient will consume recommended diet without observed clinical signs of aspiration with: Minimal assistance Swallow Study Goal #1 - Progress: Met Patient will utilize recommended strategies during swallow to increase swallowing safety with: Minimal cueing Swallow Study Goal #2 - Progress: Not met  General Temperature Spikes Noted: No Respiratory Status: Supplemental O2 delivered via (comment) Behavior/Cognition: Alert Oral Cavity - Dentition: Edentulous Patient Positioning: Upright in bed  Oral Cavity - Oral Hygiene Does patient have any of the following "at risk" factors?: Oxygen therapy - cannula, mask, simple oxygen  devices   Dysphagia Treatment Treatment focused on: Skilled observation of diet tolerance;Patient/family/caregiver education Treatment Methods/Modalities: Skilled observation Patient observed directly with PO's: Yes Type of PO's observed: Thin liquids;Regular Feeding: Needs assist Liquids provided via: Straw Oral Phase Signs & Symptoms: Prolonged oral phase Pharyngeal Phase Signs & Symptoms: Delayed cough;Complaints of globus Type of cueing: Verbal Amount of cueing: Moderate  Harlon Ditty, [place] CCC-SLP [phone]  Claudine Mouton 01/22/2012, 10:38 AM

## 2012-01-22 NOTE — Progress Notes (Signed)
Follow-up visit.  Spoke with patient briefly. Pt is still anxious.  Provided emotional support.  Ended visit with prayer.  Will continue to follow. Linnaea Ahn  [phone] oncall pager

## 2012-01-22 NOTE — Discharge Instructions (Signed)
Dear Holly Kim,   We are sorry that you were having chest painwhen you came into the hospital. Fortunately your heart and esophagus were both fine. Also, when you feel you cracked your knee cap. Therefore, you need to wear your brace for 2 weeks until you see Dr. August Saucer at [facility]. Your appointment in on April 24 at 1:30 PM.   You should also schedule an appointment with Dr. Swaziland for sometime next week.   Take Care,   Dr. [name]

## 2012-01-22 NOTE — Progress Notes (Signed)
Pt discharged to SNF and d/c papers sent with pt via ambulance driver.  Pt remains stable. No signs and symptoms of distress. Educated to return to ER in the event of SOB, dizziness, chest pain, or fainting. Laverda Sorenson, RN

## 2012-01-22 NOTE — Progress Notes (Signed)
Inpatient Diabetes Program Recommendations  AACE/ADA: New Consensus Statement on Inpatient Glycemic Control (2009)  Target Ranges:  Prepandial:   less than 140 mg/dL      Peak postprandial:   less than 180 mg/dL (1-2 hours)      Critically ill patients:  140 - 180 mg/dL   Reason for Visit:Hyperglycemia.  Lantus decreased too much.  Inpatient Diabetes Program Recommendations Insulin - Basal: Lantus decreased from 40 units dialy to 10 units daily due to hypoglcyemia.  However, now pt is running contnuously high in 200's  Pls increase to 20units or may need 30 units. Correction (SSI): xxxxx Insulin - Meal Coverage: xxx  Note: Thank you, Lenor Coffin, RN, CNS, Diabetes Coordinator [phone])

## 2012-01-22 NOTE — Progress Notes (Addendum)
Subjective: Since we last evaluated the patient- she had an EGD yesterday which showed no strictures or signs of obstruction. Pt still c/o chest pain as before. Is not associated with meal. She is tolerating liquids well and had some of her breakfast too. No N/V or diarrhea.  Objective: Vital signs in last 24 hours: Temp:  [97.9 F (36.6 C)-98.5 F (36.9 C)] 98.3 F (36.8 C) (04/10 0900) Pulse Rate:  [57-64] 61  (04/10 0900) Resp:  [19-38] 22  (04/10 0900) BP: (84-170)/(28-72) 100/44 mmHg (04/10 0900) SpO2:  [97 %-100 %] 100 % (04/10 0900) Weight:  [123.968 kg (273 lb 4.8 oz)] 123.968 kg (273 lb 4.8 oz) (04/09 2114) Last BM Date: 01/19/12  Intake/Output from previous day: 04/09 0701 - 04/10 0700 In: 821.3 [I.V.:821.3] Out: -  Intake/Output this shift:   General: resting in bed, in distress due to pain. Somnolent. HEENT: PERRL, EOMI, no scleral icterus Cardiac: RRR, no rubs, murmurs or gallops Pulm: diffuse rhonchi B/L Abd: soft, obese, nontender, nondistended, BS present Ext: 2+ edema bilaterally Neuro:Nonfocal  Lab Results:  Basename 01/20/12 0508  WBC 8.5  HGB 8.3*  HCT 26.1*  PLT 159   BMET  Basename 01/22/12 0555 01/21/12 0550 01/20/12 0508  NA 132* 133* 130*  K 4.5 4.2 4.7  CL 93* 96 92*  CO2 32 31 31  GLUCOSE 236* 39* 62*  BUN 23 19 18   CREATININE 1.33* 1.12* 1.10  CALCIUM 8.8 8.7 8.7   Studies/Results: Dg Knee 4 Views W/patella Left  01/20/2012  *RADIOLOGY REPORT*  Clinical Data: Fall.  Knee pain.  LEFT KNEE - COMPLETE 4+ VIEW  Comparison: 07/28/2010  Findings: Prepatellar subcutaneous edema is present, and subtle oblique lucency in the patella is not readily apparent on the prior exam from 2011 and is suspicious for a nondisplaced patellar fracture.  A small effusion is present the suprapatellar bursa.  Severe patellofemoral spurring is present.  There is also prominent medial and lateral compartmental osteoarthritis.  Vascular calcifications noted.   Dystrophic calcifications are visible in the subcutaneous tissues.  IMPRESSION:  1.  Abnormal oblique lucency across the patella is suspicious for a nondisplaced fracture. 2.  Tri-compartmental osteoarthritis. 3.  Small knee effusion and prepatellar soft tissue swelling. 4.  Atherosclerosis.  Original Report Authenticated By: Dellia Cloud, M.D.    Medications: I have reviewed the patient's current medications.  Assessment/Plan:  # Dysphagia: Mild to moderate esophageal smooth narrowing seen on barium swallow- but normal EGD w/o any stricture/esophagitis/gastritis/ulcers or Hiatal henia - Advance diet as tolerated. - If symptoms persists- consider esophageal manometry if needed. - Will discuss w/ Dr. Loreta Ave for further recs. - Discharge planning per FMTS  # Patellar Fracture: Ortho Consult per primary team.      LOS: 9 days   PATEL,RAVI 01/22/2012, 12:14 PM

## 2012-04-19 ENCOUNTER — Inpatient Hospital Stay (HOSPITAL_COMMUNITY)
Admission: EM | Admit: 2012-04-19 | Discharge: 2012-04-25 | DRG: 208 | Disposition: A | Discharge status: Skilled Nursing Facility | Payer: Medicare Other | Attending: Internal Medicine | Admitting: Internal Medicine

## 2012-04-19 ENCOUNTER — Encounter (HOSPITAL_COMMUNITY): Payer: Self-pay | Admitting: Emergency Medicine

## 2012-04-19 ENCOUNTER — Other Ambulatory Visit: Payer: Self-pay

## 2012-04-19 ENCOUNTER — Emergency Department (HOSPITAL_COMMUNITY): Payer: Medicare Other

## 2012-04-19 ENCOUNTER — Inpatient Hospital Stay (HOSPITAL_COMMUNITY): Payer: Medicare Other

## 2012-04-19 DIAGNOSIS — Z9861 Coronary angioplasty status: Secondary | ICD-10-CM

## 2012-04-19 DIAGNOSIS — R4182 Altered mental status, unspecified: Secondary | ICD-10-CM

## 2012-04-19 DIAGNOSIS — I509 Heart failure, unspecified: Secondary | ICD-10-CM | POA: Diagnosis present

## 2012-04-19 DIAGNOSIS — H543 Unqualified visual loss, both eyes: Secondary | ICD-10-CM

## 2012-04-19 DIAGNOSIS — F411 Generalized anxiety disorder: Secondary | ICD-10-CM

## 2012-04-19 DIAGNOSIS — F603 Borderline personality disorder: Secondary | ICD-10-CM

## 2012-04-19 DIAGNOSIS — D649 Anemia, unspecified: Secondary | ICD-10-CM

## 2012-04-19 DIAGNOSIS — Z79899 Other long term (current) drug therapy: Secondary | ICD-10-CM

## 2012-04-19 DIAGNOSIS — J9600 Acute respiratory failure, unspecified whether with hypoxia or hypercapnia: Secondary | ICD-10-CM

## 2012-04-19 DIAGNOSIS — R1011 Right upper quadrant pain: Secondary | ICD-10-CM

## 2012-04-19 DIAGNOSIS — R630 Anorexia: Secondary | ICD-10-CM

## 2012-04-19 DIAGNOSIS — S82035A Nondisplaced transverse fracture of left patella, initial encounter for closed fracture: Secondary | ICD-10-CM

## 2012-04-19 DIAGNOSIS — I2510 Atherosclerotic heart disease of native coronary artery without angina pectoris: Secondary | ICD-10-CM

## 2012-04-19 DIAGNOSIS — Z7409 Other reduced mobility: Secondary | ICD-10-CM

## 2012-04-19 DIAGNOSIS — F3289 Other specified depressive episodes: Secondary | ICD-10-CM

## 2012-04-19 DIAGNOSIS — R7989 Other specified abnormal findings of blood chemistry: Secondary | ICD-10-CM

## 2012-04-19 DIAGNOSIS — R5381 Other malaise: Secondary | ICD-10-CM

## 2012-04-19 DIAGNOSIS — F329 Major depressive disorder, single episode, unspecified: Secondary | ICD-10-CM

## 2012-04-19 DIAGNOSIS — E662 Morbid (severe) obesity with alveolar hypoventilation: Secondary | ICD-10-CM | POA: Diagnosis present

## 2012-04-19 DIAGNOSIS — J9690 Respiratory failure, unspecified, unspecified whether with hypoxia or hypercapnia: Secondary | ICD-10-CM

## 2012-04-19 DIAGNOSIS — E875 Hyperkalemia: Secondary | ICD-10-CM | POA: Diagnosis not present

## 2012-04-19 DIAGNOSIS — I80299 Phlebitis and thrombophlebitis of other deep vessels of unspecified lower extremity: Secondary | ICD-10-CM

## 2012-04-19 DIAGNOSIS — I959 Hypotension, unspecified: Secondary | ICD-10-CM

## 2012-04-19 DIAGNOSIS — R32 Unspecified urinary incontinence: Secondary | ICD-10-CM

## 2012-04-19 DIAGNOSIS — J449 Chronic obstructive pulmonary disease, unspecified: Secondary | ICD-10-CM

## 2012-04-19 DIAGNOSIS — I214 Non-ST elevation (NSTEMI) myocardial infarction: Secondary | ICD-10-CM

## 2012-04-19 DIAGNOSIS — G2401 Drug induced subacute dyskinesia: Secondary | ICD-10-CM

## 2012-04-19 DIAGNOSIS — J9620 Acute and chronic respiratory failure, unspecified whether with hypoxia or hypercapnia: Principal | ICD-10-CM | POA: Diagnosis present

## 2012-04-19 DIAGNOSIS — J189 Pneumonia, unspecified organism: Secondary | ICD-10-CM

## 2012-04-19 DIAGNOSIS — I2789 Other specified pulmonary heart diseases: Secondary | ICD-10-CM | POA: Diagnosis present

## 2012-04-19 DIAGNOSIS — I2489 Other forms of acute ischemic heart disease: Secondary | ICD-10-CM | POA: Diagnosis present

## 2012-04-19 DIAGNOSIS — M549 Dorsalgia, unspecified: Secondary | ICD-10-CM

## 2012-04-19 DIAGNOSIS — E871 Hypo-osmolality and hyponatremia: Secondary | ICD-10-CM | POA: Diagnosis present

## 2012-04-19 DIAGNOSIS — J4489 Other specified chronic obstructive pulmonary disease: Secondary | ICD-10-CM

## 2012-04-19 DIAGNOSIS — E7800 Pure hypercholesterolemia, unspecified: Secondary | ICD-10-CM

## 2012-04-19 DIAGNOSIS — I4891 Unspecified atrial fibrillation: Secondary | ICD-10-CM

## 2012-04-19 DIAGNOSIS — M959 Acquired deformity of musculoskeletal system, unspecified: Secondary | ICD-10-CM

## 2012-04-19 DIAGNOSIS — E118 Type 2 diabetes mellitus with unspecified complications: Secondary | ICD-10-CM

## 2012-04-19 DIAGNOSIS — I872 Venous insufficiency (chronic) (peripheral): Secondary | ICD-10-CM

## 2012-04-19 DIAGNOSIS — E119 Type 2 diabetes mellitus without complications: Secondary | ICD-10-CM

## 2012-04-19 DIAGNOSIS — Z86711 Personal history of pulmonary embolism: Secondary | ICD-10-CM

## 2012-04-19 DIAGNOSIS — R531 Weakness: Secondary | ICD-10-CM

## 2012-04-19 DIAGNOSIS — I5033 Acute on chronic diastolic (congestive) heart failure: Secondary | ICD-10-CM | POA: Diagnosis present

## 2012-04-19 DIAGNOSIS — A419 Sepsis, unspecified organism: Secondary | ICD-10-CM | POA: Diagnosis present

## 2012-04-19 DIAGNOSIS — R6521 Severe sepsis with septic shock: Secondary | ICD-10-CM

## 2012-04-19 DIAGNOSIS — Z6841 Body Mass Index (BMI) 40.0 and over, adult: Secondary | ICD-10-CM

## 2012-04-19 DIAGNOSIS — I5189 Other ill-defined heart diseases: Secondary | ICD-10-CM

## 2012-04-19 DIAGNOSIS — G609 Hereditary and idiopathic neuropathy, unspecified: Secondary | ICD-10-CM

## 2012-04-19 DIAGNOSIS — K219 Gastro-esophageal reflux disease without esophagitis: Secondary | ICD-10-CM

## 2012-04-19 DIAGNOSIS — J811 Chronic pulmonary edema: Secondary | ICD-10-CM

## 2012-04-19 DIAGNOSIS — K769 Liver disease, unspecified: Secondary | ICD-10-CM

## 2012-04-19 DIAGNOSIS — I248 Other forms of acute ischemic heart disease: Secondary | ICD-10-CM | POA: Diagnosis present

## 2012-04-19 DIAGNOSIS — N390 Urinary tract infection, site not specified: Secondary | ICD-10-CM | POA: Diagnosis present

## 2012-04-19 DIAGNOSIS — K7290 Hepatic failure, unspecified without coma: Secondary | ICD-10-CM

## 2012-04-19 DIAGNOSIS — N179 Acute kidney failure, unspecified: Secondary | ICD-10-CM | POA: Diagnosis present

## 2012-04-19 DIAGNOSIS — IMO0002 Reserved for concepts with insufficient information to code with codable children: Secondary | ICD-10-CM

## 2012-04-19 DIAGNOSIS — R627 Adult failure to thrive: Secondary | ICD-10-CM

## 2012-04-19 DIAGNOSIS — E1165 Type 2 diabetes mellitus with hyperglycemia: Secondary | ICD-10-CM

## 2012-04-19 LAB — CBC WITH DIFFERENTIAL/PLATELET
Hemoglobin: 6.3 g/dL — CL (ref 12.0–15.0)
Lymphocytes Relative: 12 % (ref 12–46)
Lymphs Abs: 1.9 10*3/uL (ref 0.7–4.0)
MCH: 23.8 pg — ABNORMAL LOW (ref 26.0–34.0)
Monocytes Relative: 4 % (ref 3–12)
Neutro Abs: 13.1 10*3/uL — ABNORMAL HIGH (ref 1.7–7.7)
Neutrophils Relative %: 84 % — ABNORMAL HIGH (ref 43–77)
Platelets: 321 10*3/uL (ref 150–400)
RBC: 2.65 MIL/uL — ABNORMAL LOW (ref 3.87–5.11)
WBC: 15.6 10*3/uL — ABNORMAL HIGH (ref 4.0–10.5)

## 2012-04-19 LAB — CARBOXYHEMOGLOBIN
Carboxyhemoglobin: 1.4 % (ref 0.5–1.5)
Methemoglobin: 0.9 % (ref 0.0–1.5)
O2 Saturation: 68 %
Total hemoglobin: 8.6 g/dL — ABNORMAL LOW (ref 12.0–16.0)

## 2012-04-19 LAB — RAPID URINE DRUG SCREEN, HOSP PERFORMED
Amphetamines: NOT DETECTED
Benzodiazepines: NOT DETECTED
Opiates: POSITIVE — AB
Tetrahydrocannabinol: NOT DETECTED

## 2012-04-19 LAB — URINALYSIS, ROUTINE W REFLEX MICROSCOPIC
Bilirubin Urine: NEGATIVE
Glucose, UA: NEGATIVE mg/dL
Ketones, ur: NEGATIVE mg/dL
Nitrite: NEGATIVE
Protein, ur: NEGATIVE mg/dL
pH: 5 (ref 5.0–8.0)

## 2012-04-19 LAB — BLOOD GAS, ARTERIAL
Bicarbonate: 23.6 mEq/L (ref 20.0–24.0)
O2 Saturation: 99.3 %
PEEP: 5 cmH2O
Patient temperature: 96.5

## 2012-04-19 LAB — GLUCOSE, CAPILLARY

## 2012-04-19 LAB — ABO/RH: ABO/RH(D): A NEG

## 2012-04-19 LAB — COMPREHENSIVE METABOLIC PANEL
ALT: 1907 U/L — ABNORMAL HIGH (ref 0–35)
Alkaline Phosphatase: 91 U/L (ref 39–117)
BUN: 78 mg/dL — ABNORMAL HIGH (ref 6–23)
CO2: 29 mEq/L (ref 19–32)
Chloride: 89 mEq/L — ABNORMAL LOW (ref 96–112)
GFR calc Af Amer: 26 mL/min — ABNORMAL LOW (ref >90)
Glucose, Bld: 159 mg/dL — ABNORMAL HIGH (ref 70–99)
Potassium: 5.1 mEq/L (ref 3.5–5.1)
Sodium: 128 mEq/L — ABNORMAL LOW (ref 135–145)
Total Bilirubin: 0.3 mg/dL (ref 0.3–1.2)

## 2012-04-19 LAB — CARDIAC PANEL(CRET KIN+CKTOT+MB+TROPI)
CK, MB: 2.1 ng/mL (ref 0.3–4.0)
Total CK: 322 U/L — ABNORMAL HIGH (ref 7–177)

## 2012-04-19 LAB — URINE MICROSCOPIC-ADD ON

## 2012-04-19 LAB — ETHANOL: Alcohol, Ethyl (B): <11 mg/dL (ref 0–11)

## 2012-04-19 LAB — PROTIME-INR: Prothrombin Time: 19.6 seconds — ABNORMAL HIGH (ref 11.6–15.2)

## 2012-04-19 MED ORDER — VITAMIN K1 10 MG/ML IJ SOLN
10.0000 mg | Freq: Once | INTRAMUSCULAR | Status: AC
  Administered 2012-04-19: 10 mg via SUBCUTANEOUS
  Filled 2012-04-19: qty 1

## 2012-04-19 MED ORDER — SODIUM CHLORIDE 0.9 % IV BOLUS (SEPSIS): 25.0000 mL/kg | Freq: Once | INTRAVENOUS | Status: DC

## 2012-04-19 MED ORDER — SODIUM CHLORIDE 0.9 % IV SOLN: 250.0000 mL | INTRAVENOUS | Status: DC | PRN

## 2012-04-19 MED ORDER — SUCCINYLCHOLINE CHLORIDE 20 MG/ML IJ SOLN
INTRAMUSCULAR | Status: AC
  Administered 2012-04-19: 100 mg
  Filled 2012-04-19: qty 5

## 2012-04-19 MED ORDER — SODIUM CHLORIDE 0.9 % IV SOLN
1.0000 g | Freq: Three times a day (TID) | INTRAVENOUS | Status: DC
  Administered 2012-04-20 (×2): 1 g via INTRAVENOUS
  Filled 2012-04-19 (×3): qty 1

## 2012-04-19 MED ORDER — VANCOMYCIN HCL 1000 MG IV SOLR
1500.0000 mg | INTRAVENOUS | Status: AC
  Filled 2012-04-19: qty 1500

## 2012-04-19 MED ORDER — LIDOCAINE HCL (CARDIAC) 20 MG/ML IV SOLN
INTRAVENOUS | Status: AC
  Filled 2012-04-19: qty 5

## 2012-04-19 MED ORDER — VANCOMYCIN HCL 1000 MG IV SOLR: 1500.0000 mg | INTRAVENOUS | Status: DC

## 2012-04-19 MED ORDER — FENTANYL CITRATE 0.05 MG/ML IJ SOLN
25.0000 ug | INTRAMUSCULAR | Status: DC | PRN
  Administered 2012-04-19 – 2012-04-20 (×6): 50 ug via INTRAVENOUS
  Filled 2012-04-19 (×8): qty 2

## 2012-04-19 MED ORDER — SODIUM CHLORIDE 0.9 % IV BOLUS (SEPSIS): 500.0000 mL | Freq: Once | INTRAVENOUS | Status: DC

## 2012-04-19 MED ORDER — SODIUM CHLORIDE 0.9 % IV SOLN
500.0000 mg | Freq: Once | INTRAVENOUS | Status: AC
  Administered 2012-04-19: 500 mg via INTRAVENOUS
  Filled 2012-04-19: qty 0.5

## 2012-04-19 MED ORDER — HEPARIN SODIUM (PORCINE) 5000 UNIT/ML IJ SOLN
5000.0000 [IU] | Freq: Three times a day (TID) | INTRAMUSCULAR | Status: DC
Start: 2012-04-19 — End: 2012-04-25
  Administered 2012-04-19 – 2012-04-25 (×17): 5000 [IU] via SUBCUTANEOUS
  Filled 2012-04-19 (×20): qty 1

## 2012-04-19 MED ORDER — PANTOPRAZOLE SODIUM 40 MG PO PACK
40.0000 mg | PACK | Freq: Every day | ORAL | Status: DC
  Administered 2012-04-19 – 2012-04-20 (×2): 40 mg
  Filled 2012-04-19 (×3): qty 20

## 2012-04-19 MED ORDER — NOREPINEPHRINE BITARTRATE 1 MG/ML IJ SOLN
2.0000 ug/min | INTRAVENOUS | Status: DC | PRN
  Filled 2012-04-19: qty 4

## 2012-04-19 MED ORDER — VANCOMYCIN HCL IN DEXTROSE 1-5 GM/200ML-% IV SOLN
1000.0000 mg | Freq: Once | INTRAVENOUS | Status: AC
  Administered 2012-04-19: 1000 mg via INTRAVENOUS
  Filled 2012-04-19: qty 200

## 2012-04-19 MED ORDER — SUCCINYLCHOLINE CHLORIDE 20 MG/ML IJ SOLN
INTRAMUSCULAR | Status: DC | PRN
  Administered 2012-04-19: 100 mg via INTRAVENOUS

## 2012-04-19 MED ORDER — SODIUM CHLORIDE 0.9 % IV BOLUS (SEPSIS)
1000.0000 mL | Freq: Once | INTRAVENOUS | Status: AC
  Administered 2012-04-19: 1000 mL via INTRAVENOUS

## 2012-04-19 MED ORDER — MIDAZOLAM HCL 2 MG/2ML IJ SOLN
INTRAMUSCULAR | Status: AC
  Administered 2012-04-19: 2 mg
  Filled 2012-04-19: qty 2

## 2012-04-19 MED ORDER — SODIUM CHLORIDE 0.9 % IV BOLUS (SEPSIS): 500.0000 mL | INTRAVENOUS | Status: DC | PRN

## 2012-04-19 MED ORDER — ETOMIDATE 2 MG/ML IV SOLN
INTRAVENOUS | Status: DC | PRN
  Administered 2012-04-19: 20 mg via INTRAVENOUS

## 2012-04-19 MED ORDER — DOBUTAMINE IN D5W 4-5 MG/ML-% IV SOLN
2.5000 ug/kg/min | INTRAVENOUS | Status: DC | PRN
  Administered 2012-04-20: 2.5 ug/kg/min via INTRAVENOUS
  Filled 2012-04-19 (×2): qty 250

## 2012-04-19 MED ORDER — NOREPINEPHRINE BITARTRATE 1 MG/ML IJ SOLN
2.0000 ug/min | INTRAVENOUS | Status: DC
  Administered 2012-04-19 – 2012-04-20 (×2): 5 ug/min via INTRAVENOUS
  Filled 2012-04-19 (×2): qty 4

## 2012-04-19 MED ORDER — SODIUM CHLORIDE 0.9 % IV SOLN
INTRAVENOUS | Status: DC
  Administered 2012-04-20: 08:00:00 via INTRAVENOUS

## 2012-04-19 MED ORDER — ETOMIDATE 2 MG/ML IV SOLN
INTRAVENOUS | Status: AC
  Administered 2012-04-19: 20 mg via INTRAVENOUS
  Filled 2012-04-19: qty 20

## 2012-04-19 MED ORDER — SODIUM CHLORIDE 0.9 % IV BOLUS (SEPSIS)
2000.0000 mL | Freq: Once | INTRAVENOUS | Status: AC
  Administered 2012-04-19: 2000 mL via INTRAVENOUS

## 2012-04-19 MED ORDER — MIDAZOLAM HCL 5 MG/ML IJ SOLN
INTRAMUSCULAR | Status: AC
  Administered 2012-04-19: 2 mg via INTRAVENOUS
  Filled 2012-04-19: qty 1

## 2012-04-19 MED ORDER — MIDAZOLAM HCL 5 MG/ML IJ SOLN
INTRAMUSCULAR | Status: AC
  Administered 2012-04-19: 4 mg
  Filled 2012-04-19: qty 1

## 2012-04-19 MED ORDER — MIDAZOLAM HCL 2 MG/2ML IJ SOLN
1.0000 mg | INTRAMUSCULAR | Status: DC | PRN
  Administered 2012-04-19: 2 mg via INTRAVENOUS
  Filled 2012-04-19: qty 2

## 2012-04-19 MED ORDER — ROCURONIUM BROMIDE 50 MG/5ML IV SOLN
INTRAVENOUS | Status: AC
  Filled 2012-04-19: qty 2

## 2012-04-19 MED ORDER — INSULIN ASPART 100 UNIT/ML ~~LOC~~ SOLN
0.0000 [IU] | SUBCUTANEOUS | Status: DC
  Administered 2012-04-19 – 2012-04-20 (×3): 2 [IU] via SUBCUTANEOUS
  Administered 2012-04-20: 1 [IU] via SUBCUTANEOUS
  Administered 2012-04-20: 2 [IU] via SUBCUTANEOUS
  Administered 2012-04-20: 1 [IU] via SUBCUTANEOUS
  Administered 2012-04-20 – 2012-04-21 (×2): 2 [IU] via SUBCUTANEOUS
  Administered 2012-04-21: 1 [IU] via SUBCUTANEOUS
  Administered 2012-04-21: 2 [IU] via SUBCUTANEOUS
  Administered 2012-04-21: 1 [IU] via SUBCUTANEOUS
  Administered 2012-04-21 – 2012-04-22 (×5): 2 [IU] via SUBCUTANEOUS

## 2012-04-19 MED ORDER — SODIUM CHLORIDE 0.9 % IV SOLN
500.0000 mg | Freq: Three times a day (TID) | INTRAVENOUS | Status: DC
  Administered 2012-04-19: 500 mg via INTRAVENOUS
  Filled 2012-04-19 (×2): qty 0.5

## 2012-04-19 MED ORDER — FENTANYL CITRATE 0.05 MG/ML IJ SOLN: 50.0000 ug | INTRAMUSCULAR | Status: DC | PRN

## 2012-04-19 MED ORDER — VANCOMYCIN HCL IN DEXTROSE 1-5 GM/200ML-% IV SOLN: 1000.0000 mg | Freq: Once | INTRAVENOUS | Status: DC

## 2012-04-19 NOTE — ED Notes (Signed)
EDP [place] and critical care MD  [facility]  made aware of pt current vital signs 80/54  HR 62 and foley temp 96.7- orders only for levaphed- ordered from pharmacy

## 2012-04-19 NOTE — ED Notes (Signed)
[id number]<AV> Expected date:04/19/12<BR> Expected time: 1:23 PM<BR> Means of arrival:Ambulance<BR> Comments:<BR> Altered mental status

## 2012-04-19 NOTE — H&P (Signed)
Name: Holly Kim MRN: [id number] DOB: 18-Mar-1941    LOS: 0  Referring Provider:  ED  Reason for Referral:  Sepsis / AMS  PULMONARY / CRITICAL CARE MEDICINE  HPI:  71 y/o F, SNF resident,  with PMH of PE/DVT (not on anticoagulation), DM, CAD, Obesity, HTN, COPD presented to [facility] ED 7/7 with worsening mental status.  ED evaluation demonstrated acute renal failure, hyperkalemia, leukocytosis, lactic acid 1.9 / PCT 0.97.  Further decompensated requiring intubation in ED.  Recent Rx for UTI.  PCCM consulted for ICU admit.     Past Medical History  Diagnosis Date  . PE (pulmonary embolism)   . DVT (deep venous thrombosis)   . Diabetes mellitus   . Asthma   . Hypertension   . CAD (coronary artery disease)     RCA ostial DES 9/11, Circumflex ostial PTCA only 09/11/11.  Marland Kitchen GERD (gastroesophageal reflux disease)   . COPD (chronic obstructive pulmonary disease)   . Obesity   . Tardive dyskinesia   . History of renal failure   . Depression   . Borderline personality disorder   . Anxiety   . Anemia     hx of blood transfusion x 3  . Arthritis   . Peripheral neuropathy   . Shortness of breath     at rest   Past Surgical History  Procedure Date  . US echocardiography 2008  . Orif ankle fracture   . Appendectomy   . Cervical discectomy   . Ptca 06/2010  . Tonsillectomy   . Transthoracic echocardiogram 06/2010    Grade 1 diastolic, EF 55-60  . Esophagogastroduodenoscopy 01/21/2012    Procedure: ESOPHAGOGASTRODUODENOSCOPY (EGD);  Surgeon: Theda Belfast, MD;  [facility] ENDOSCOPY;  Service: Endoscopy;  Laterality: N/A;   Prior to Admission medications   Medication Sig Start Date End Date Taking? Authorizing Provider  aspirin 81 MG EC tablet Take 81 mg by mouth daily.   08/29/11  Yes Sarah T Swaz[place], MD  atorvastatin (LIPITOR) 40 MG tablet Take 40 mg by mouth daily.   Yes Historical Provider, MD  bisoprolol (ZEBETA) 5 MG tablet Take 5 mg by mouth daily.   09/14/11 09/13/12 Yes  Floydene Flock, MD  clonazePAM (KLONOPIN) 0.5 MG tablet Take 0.25 mg by mouth See admin instructions. Take 1/2 tablet twice daily   Yes Historical Provider, MD  clopidogrel (PLAVIX) 75 MG tablet Take 75 mg by mouth daily.   08/05/11  Yes Sarah T [place], MD  docusate sodium (COLACE) 100 MG capsule Take 100 mg by mouth 2 (two) times daily.   Yes Historical Provider, MD  insulin glargine (LANTUS) 100 UNIT/ML injection Inject 50 Units into the skin at bedtime.   Yes Historical Provider, MD  LORazepam (ATIVAN) 1 MG tablet Take 1 mg by mouth every 8 (eight) hours as needed. For anxiety   Yes Historical Provider, MD  morphine (MS CONTIN) 15 MG 12 hr tablet Take 15 mg by mouth 3 (three) times daily.   Yes Historical Provider, MD  nitroGLYCERIN (NITROSTAT) 0.4 MG SL tablet Place 0.4 mg under the tongue every 5 (five) minutes as needed. For chest pain 09/14/11 09/13/12 Yes Floydene Flock, MD  omeprazole (PRILOSEC) 20 MG capsule Take 40 mg by mouth daily.   Yes Historical Provider, MD  oxyCODONE (OXY IR/ROXICODONE) 5 MG immediate release tablet Take 10 mg by mouth every 4 (four) hours as needed. pain   Yes Historical Provider, MD  promethazine (PHENERGAN) 12.5 MG tablet Take  12.5 mg by mouth every 6 (six) hours as needed. For nausea   Yes Historical Provider, MD  senna (SENOKOT) 8.6 MG tablet Take 3 tablets by mouth daily.   08/05/11  Yes Sarah T [place], MD  sucralfate (CARAFATE) 1 G tablet Take 1 g by mouth 3 (three) times daily before meals.   08/05/11  Yes Sarah T [place], MD  tiotropium (SPIRIVA) 18 MCG inhalation capsule Place 18 mcg into inhaler and inhale daily.   Yes Historical Provider, MD  torsemide (DEMADEX) 20 MG tablet Take 20 mg by mouth daily.   08/05/11  Yes Sarah T [place], MD  venlafaxine (EFFEXOR) 75 MG tablet Take 75 mg by mouth daily.   Yes Historical Provider, MD  albuterol (PROVENTIL) (2.5 MG/3ML) 0.083% nebulizer solution Take 2.5 mg by nebulization every 6 (six) hours as needed. For  wheezing 08/05/11 08/04/12  Sarah T [place], MD  cefUROXime (CEFTIN) 500 MG tablet Take 500 mg by mouth 2 (two) times daily.    Historical Provider, MD  insulin aspart (NOVOLOG) 100 UNIT/ML injection Inject 16-24 Units into the skin 3 (three) times daily before meals. 16 Units ac breakfast; 20 Units ac lung; 24 Units ac supper 10/29/11 10/28/12  Andrena Mews, DO  permethrin (ELIMITE) 5 % cream Apply 1 application topically once.    Historical Provider, MD  rosuvastatin (CRESTOR) 40 MG tablet Take 40 mg by mouth daily.   08/05/11   Sarah T [place], MD  saccharomyces boulardii (FLORASTOR) 250 MG capsule Take 250 mg by mouth 2 (two) times daily. 04/13/12 04/19/12  Historical Provider, MD   Allergies Allergies  Allergen Reactions  . Ibuprofen     REACTION: "mouth swells up and I can't swallow good"  . Amlodipine Besylate Other (See Comments)    REACTION: hypotension  . Amoxicillin Itching  . Augmentin (Amoxicillin-Pot Clavulanate) Other (See Comments)    unknown  . Codeine Itching  . Cortisone Other (See Comments)    Pt states she gets "blood poisoning where they give me the shots of cortisone"  . Haloperidol Lactate Other (See Comments)    REACTION: My head gets places before my body  . Meclizine Other (See Comments)    unknown  . Metoclopramide Hcl     Shaking   . Nsaids Other (See Comments)    unknown  . Naproxen Rash    Family History Family History  Problem Relation Age of Onset  . Breast cancer      aunt  . Alcohol abuse Son   . Cancer Daughter   . Leukemia Father     deceased  . Alzheimer's disease Mother     deceased   Social History  reports that she has been passively smoking.  She has never used smokeless tobacco. She reports that she does not drink alcohol or use illicit drugs.  Review Of Systems:  Unable to complete as patient is on vent.    Events Since Admission: 7/7 to ED with presumed UTI , concern for sepsis, anemia --decompensated requiring intubation.    Current Status: intubated on vent  Vital Signs: FiO2 (%):  [50 %] 50 % (07/07 1542)  Physical Examination: Gen: chronically ill appearing, sedated on vent HEENT: NCAT, ETT in place PULM: Rhonchi bilat in bases CV: Tachy, s1/s2 noted AB: BS in frequent, nontender (sedated) Ext: warm, edema noted Neuro: sedated on vent  Principal Problem:  *Septic shock Active Problems:  Acute respiratory failure  HCAP (healthcare-associated pneumonia)  UTI (urinary tract infection)  Fulminant liver failure   ASSESSMENT AND PLAN  PULMONARY No results found for this basename: PHART:5,PCO2:5,PCO2ART:5,PO2ART:5,HCO3:5,O2SAT:5 in the last 168 hours Ventilator Settings: Vent Mode:  [-] PRVC FiO2 (%):  [50 %] 50 % Set Rate:  [14 bmp] 14 bmp Vt Set:  [550 mL] 550 mL PEEP:  [5 cmH20] 5 cmH20   CXR:  7/7 Endotracheal tube tip in satisfactory position approximately 3 cm above the carina.  Nasogastric tube courses below the diaphragm into the stomach. Stable dense left lower lobe atelectasis and/or pneumonia and mild right basilar atelectasis. Stable cardiomegaly and pulmonary venous hypertension without overt edema.  ETT:  7/7>>>  A:   Acute Respiratory Failure -in setting of presumed urosepsis / ARF  P:   -full vent support -f/u cxr / abg -abx as below -sputum culture  CARDIOVASCULAR  Lab 04/19/12 1451  TROPONINI <0.30  LATICACIDVEN 1.9  PROBNP --   ECG:   Lines:   7/7 Fem TLC>>>   A:  Hypotension  Hx of HLD Hx of HTN -on ACE, bisoprolol, plavix, ASA and demadex at SNF  P:  -EGDT / levophed to support MAP >65 -volume resuscitation  -hold anti-hypertensive's  -CVP's    RENAL  Lab 04/19/12 1451  NA 128*  K 5.1  CL 89*  CO2 29  BUN 78*  CREATININE 2.08*  CALCIUM 8.8  MG --  PHOS --   Intake/Output    None    Foley:  7/7>>>  A:   Acute Renal Failure Hyponatremia  P:   -volume resuscitation -f/u BMP   GASTROINTESTINAL  Lab 04/19/12 1451  AST  3556*  ALT 1907*  ALKPHOS 91  BILITOT 0.3  PROT 6.0  ALBUMIN 1.8*    A:   Elevated LFT's - likely related to hypoperfusion with low BP vs. Statin therapy  P:   -monitor, repeat in am -hold statin -check RUQ ultrasound with vascular  HEMATOLOGIC  Lab 04/19/12 1451  HGB 6.3*  HCT 20.7*  PLT 321  INR 1.63*  APTT 42*   A:   Anemia   P:  -transfuse 2 unit PRBC's  INFECTIOUS  Lab 04/19/12 1451 04/19/12 1450  WBC 15.6* --  PROCALCITON -- 0.97   Cultures: 7/7 UA>>> 7/7 UC>>> 7/7 BCx2>>> 7/7 Sputum>>>  Antibiotics: Vanco 7/7 (sepsis)>>> Meropenem (sepsis) 7/7>>>  A:   Presumed Urosepsis (7/7) LLL HCAP vs ATX  P:   -abx as above -pan culture -repeat lactic acid this evening  ENDOCRINE No results found for this basename: GLUCAP:5 in the last 168 hours A:   Hyperglycemia -in setting of stress. No acute hx of DM.     P:   -SSI  NEUROLOGIC  A:   AMS in setting of sepsis, ARF  P:   -correct underlying source, hopeful for mental status to resolve.    BEST PRACTICE / DISPOSITION Level of Care:  ICU Primary Service:  PCCM  Consultants:   Code Status:  FULL Diet:  NPO DVT Px:  Heparin  GI Px:  protonix Skin Integrity:   Social / Family:     Canary Brim, NP-C [facility] Pgr: [phone] or [phone]    04/19/2012, 4:08 PM   Attending:  I have seen and examined the patient with nurse practitioner/resident and agree with and have modified the note above.   Septic shock from either HCAP or a UTI.  I don't really understand her liver failure, statin?  EGDT, vanc/zosyn  Check RUQ ultrasound.  CC time 45  minutes  Yolonda Kida PCCM Pager: [phone] Cell: [phone] If no response, call [phone]

## 2012-04-19 NOTE — ED Notes (Signed)
Pt successfully intubated with size 8 ET tube at 25 at right lip.  Good color change on C02 detector.  Bilateral breath sounds.

## 2012-04-19 NOTE — Progress Notes (Addendum)
ANTIBIOTIC CONSULT NOTE - INITIAL  Pharmacy Consult for Vancomycin, Zosyn, CCM, abx renal adjustment Indication: r/o PNA, UTI, intra-abdominal sources  Allergies  Allergen Reactions  . Ibuprofen     REACTION: "mouth swells up and I can't swallow good"  . Amlodipine Besylate Other (See Comments)    REACTION: hypotension  . Amoxicillin Itching  . Augmentin (Amoxicillin-Pot Clavulanate) Other (See Comments)    unknown  . Codeine Itching  . Cortisone Other (See Comments)    Pt states she gets "blood poisoning where they give me the shots of cortisone"  . Haloperidol Lactate Other (See Comments)    REACTION: My head gets places before my body  . Meclizine Other (See Comments)    unknown  . Metoclopramide Hcl     Shaking   . Nsaids Other (See Comments)    unknown  . Naproxen Rash    Patient Measurements:   Weight: 124 kg last recorded 01/21/2012  Vital Signs: Temp: 96.8 F (36 C) (07/07 1526) Temp src: Core (Comment) (07/07 1526) BP: 114/89 mmHg (07/07 1526) Pulse Rate: 61  (07/07 1526) Intake/Output from previous day:   Intake/Output from this shift:    Labs:  Basename 04/19/12 1451  WBC 15.6*  HGB 6.3*  PLT 321  LABCREA --  CREATININE 2.08*   The CrCl is unknown because both a height and weight (above a minimum accepted value) are required for this calculation. No results found for this basename: VANCOTROUGH:2,VANCOPEAK:2,VANCORANDOM:2,GENTTROUGH:2,GENTPEAK:2,GENTRANDOM:2,TOBRATROUGH:2,TOBRAPEAK:2,TOBRARND:2,AMIKACINPEAK:2,AMIKACINTROU:2,AMIKACIN:2, in the last 72 hours   Microbiology: No results found for this or any previous visit (from the past 720 hour(s)).  Medical History: Past Medical History  Diagnosis Date  . PE (pulmonary embolism)   . DVT (deep venous thrombosis)   . Diabetes mellitus   . Asthma   . Hypertension   . CAD (coronary artery disease)     RCA ostial DES 9/11, Circumflex ostial PTCA only 09/11/11.  Marland Kitchen GERD (gastroesophageal reflux  disease)   . COPD (chronic obstructive pulmonary disease)   . Obesity   . Tardive dyskinesia   . History of renal failure   . Depression   . Borderline personality disorder   . Anxiety   . Anemia     hx of blood transfusion x 3  . Arthritis   . Peripheral neuropathy   . Shortness of breath     at rest   Assessment:  77 yoF, SNF resident presented with worsening mental status, ARF, leukocytosis and decompensation requiring intubation in the ED. Patient recently treated for UTI.   MD started Merrem 500 mg IV q8h for r/o UTI, pharmacy Ok to adjust antibiotics for renal function.  MD consulted pharmacy to dose Vancomycin for presumed PNA and r/o intra-abdominal source of infection.   Patient's Scr 2.08, Wt last reported as 124 kg in 01/2012. CrCl ~55 ml/min, normalized 32 ml/min  Pending blood and urine cultures  Patient received Vancomycin 1gm x 1 in the ED at 1555 today.  Goal of Therapy:  Vancomycin trough level 15-20 mcg/ml Appropriate renal dosing of antibiotics  Plan:   Vancomycin 1500 mg IV x 1 in additional to vancomycin dose earlier today for a total of 2500 mg loading dose  Vancomycin 1500 mg IV q24h  Adjust Merrem to 1gm IV q8h  Pharmacy will f/u   Geoffry Paradise Thi 04/19/2012,4:48 PM

## 2012-04-19 NOTE — ED Notes (Signed)
Pt. transported to unit, unable to obtain pending labs

## 2012-04-19 NOTE — Procedures (Signed)
Arterial Catheter Insertion Procedure Note Holly Kim [id number] January 07, 1941  Procedure: Insertion of Arterial Catheter  Indications: Blood pressure monitoring  Procedure Details Consent: Unable to obtain consent because of emergent medical necessity. Time Out: Verified patient identification, verified procedure, site/side was marked, verified correct patient position, special equipment/implants available, medications/allergies/relevent history reviewed, required imaging and test results available.  Performed  Maximum sterile technique was used including cap, gloves, gown, hand hygiene and mask. Skin prep: Chlorhexidine; local anesthetic administered 20 gauge catheter was inserted into left radial artery using the Seldinger technique.  Evaluation Blood flow good; BP tracing good. Complications: No apparent complications.   Berton Bon 04/19/2012

## 2012-04-19 NOTE — Progress Notes (Signed)
eLink Physician-Brief Progress Note Patient Name: [name] DOB: [birth year] MRN: [id number]  Date of Service  04/19/2012   HPI/Events of Note   sbp 40s but RN sas pulses real well felt. No obvious bleed. RN thinks Bp cufff inaccurate  eICU Interventions  Fluid bolus 2L Insert aline   Intervention Category Major Interventions: Hypotension - evaluation and management  Norah Fick 04/19/2012, 8:56 PM

## 2012-04-19 NOTE — ED Provider Notes (Addendum)
History     CSN: [id number]  Arrival date & time 04/19/12  1332   First MD Initiated Contact with Patient 04/19/12 1403      Chief Complaint  Patient presents with  . Altered Mental Status    (Consider location/radiation/quality/duration/timing/severity/associated sxs/prior treatment) HPI Pt presents from nursing home with AMS since last night. Pt is unable to contribute to history. Level 5 Caveat. Recently treated for UTI Past Medical History  Diagnosis Date  . PE (pulmonary embolism)   . DVT (deep venous thrombosis)   . Diabetes mellitus   . Asthma   . Hypertension   . CAD (coronary artery disease)     RCA ostial DES 9/11, Circumflex ostial PTCA only 09/11/11.  Marland Kitchen GERD (gastroesophageal reflux disease)   . COPD (chronic obstructive pulmonary disease)   . Obesity   . Tardive dyskinesia   . History of renal failure   . Depression   . Borderline personality disorder   . Anxiety   . Anemia     hx of blood transfusion x 3  . Arthritis   . Peripheral neuropathy   . Shortness of breath     at rest    Past Surgical History  Procedure Date  . US echocardiography 2008  . Orif ankle fracture   . Appendectomy   . Cervical discectomy   . Ptca 06/2010  . Tonsillectomy   . Transthoracic echocardiogram 06/2010    Grade 1 diastolic, EF 55-60  . Esophagogastroduodenoscopy 01/21/2012    Procedure: ESOPHAGOGASTRODUODENOSCOPY (EGD);  Surgeon: Theda Belfast, MD;  Location: [facility] ENDOSCOPY;  Service: Endoscopy;  Laterality: N/A;    Family History  Problem Relation Age of Onset  . Breast cancer      aunt  . Alcohol abuse Son   . Cancer Daughter   . Leukemia Father     deceased  . Alzheimer's disease Mother     deceased    History  Substance Use Topics  . Smoking status: Passive Smoker  . Smokeless tobacco: Never Used   Comment: never smoked - children smoke - lives with son.  . Alcohol Use: No    OB History    Grav Para Term Preterm Abortions TAB SAB Ect Mult Living                 Review of Systems  Unable to perform ROS: Mental status change    Allergies  Ibuprofen; Amlodipine besylate; Amoxicillin; Augmentin; Codeine; Cortisone; Haloperidol lactate; Meclizine; Metoclopramide hcl; Nsaids; and Naproxen  Home Medications   Current Outpatient Rx  Name Route Sig Dispense Refill  . ASPIRIN 81 MG PO TBEC Oral Take 81 mg by mouth daily.      . ATORVASTATIN CALCIUM 40 MG PO TABS Oral Take 40 mg by mouth daily.    Marland Kitchen BISOPROLOL FUMARATE 5 MG PO TABS Oral Take 5 mg by mouth daily.      Marland Kitchen CLONAZEPAM 0.5 MG PO TABS Oral Take 0.25 mg by mouth 2 (two) times daily. Take 1/2 tablet twice daily    . CLOPIDOGREL BISULFATE 75 MG PO TABS Oral Take 75 mg by mouth daily.      Marland Kitchen DOCUSATE SODIUM 100 MG PO CAPS Oral Take 100 mg by mouth 2 (two) times daily.    Marland Kitchen FERROUS SULFATE 325 (65 FE) MG PO TABS Oral Take 325 mg by mouth daily with breakfast.    . INSULIN ASPART 100 UNIT/ML [place] SOLN Subcutaneous Inject 3-24 Units into the skin 3 (three)  times daily before meals. 16 Units with breakfast; 24 Units with lunch; 24 Units with supper Inject 3 units if CBG is greater than or equal to 150 before meals and at bedtime.    . INSULIN GLARGINE 100 UNIT/ML  SOLN Subcutaneous Inject 50 Units into the skin at bedtime.    Marland Kitchen LISINOPRIL 5 MG PO TABS Oral Take 5 mg by mouth daily.    Marland Kitchen LORAZEPAM 1 MG PO TABS Oral Take 1 mg by mouth every 8 (eight) hours as needed. For anxiety    . MORPHINE SULFATE ER 15 MG PO TBCR Oral Take 15 mg by mouth 3 (three) times daily.    . ADULT MULTIVITAMIN W/MINERALS CH Oral Take 1 tablet by mouth daily.    Marland Kitchen NITROGLYCERIN 0.4 MG SL SUBL Sublingual Place 0.4 mg under the tongue every 5 (five) minutes as needed. For chest pain    . OMEPRAZOLE 20 MG PO CPDR Oral Take 40 mg by mouth daily.    . OXYCODONE HCL 5 MG PO TABS Oral Take 10 mg by mouth every 4 (four) hours as needed. pain    . PROMETHAZINE HCL 12.5 MG PO TABS Oral Take 12.5 mg by mouth every 6 (six)  hours as needed. For nausea    . SENNOSIDES 8.6 MG PO TABS Oral Take 3 tablets by mouth daily.      . SUCRALFATE 1 G PO TABS Oral Take 1 g by mouth 3 (three) times daily before meals.      Marland Kitchen TIOTROPIUM BROMIDE MONOHYDRATE 18 MCG IN CAPS Inhalation Place 18 mcg into inhaler and inhale daily.    . TORSEMIDE 20 MG PO TABS Oral Take 20 mg by mouth daily.      . VENLAFAXINE HCL 75 MG PO TABS Oral Take 75 mg by mouth daily.    Marland Kitchen CEFUROXIME AXETIL 500 MG PO TABS Oral Take 500 mg by mouth 2 (two) times daily.    Marland Kitchen SACCHAROMYCES BOULARDII 250 MG PO CAPS Oral Take 250 mg by mouth 2 (two) times daily.      BP 114/89  Pulse 61  Temp 96.8 F (36 C) (Core (Comment))  SpO2 97%  Physical Exam  Nursing note and vitals reviewed. Constitutional: She appears well-developed and well-nourished. No distress.  HENT:  Head: Normocephalic and atraumatic.  Mouth/Throat: Oropharynx is clear and moist.  Eyes: EOM are normal. Pupils are equal, round, and reactive to light.  Neck: Normal range of motion. Neck supple.  Cardiovascular: Normal rate and regular rhythm.   Pulmonary/Chest: Breath sounds normal. No respiratory distress. She has no wheezes. She has no rales.       Decreased resp effort  Abdominal: Soft. Bowel sounds are normal. There is no tenderness. There is no rebound and no guarding.  Musculoskeletal: Normal range of motion. She exhibits no edema and no tenderness.  Neurological:       Pt is lethargic. Opens eye to voice and will follow simple commands. No focal neuro deficits.   Skin: Skin is warm and dry. No rash noted. No erythema.    ED Course  CENTRAL LINE Date/Time: 04/19/2012 4:42 PM Performed by: Loren Racer Authorized by: Loren Racer Consent: Verbal consent not obtained. Written consent not obtained. The procedure was performed in an emergent situation. Indications: vascular access Anesthesia: local infiltration Local anesthetic: lidocaine 1% without epinephrine Anesthetic  total: 2 ml Patient sedated: no Preparation: skin prepped with 2% chlorhexidine Skin prep agent dried: skin prep agent completely dried prior to  procedure Sterile barriers: all five maximum sterile barriers used - cap, mask, sterile gown, sterile gloves, and large sterile sheet Location details: right femoral Site selection rationale: Questionable bleeding coagulopathy Catheter type: triple lumen Ultrasound guidance: yes Number of attempts: 1 Successful placement: yes Post-procedure: line sutured and dressing applied Assessment: blood return through all parts and free fluid flow Patient tolerance: Patient tolerated the procedure well with no immediate complications.  INTUBATION Date/Time: 04/19/2012 4:46 PM Performed by: Loren Racer Authorized by: Loren Racer Consent: Verbal consent not obtained. The procedure was performed in an emergent situation. Indications: respiratory failure and airway protection Intubation method: video-assisted Patient status: paralyzed (RSI) Preoxygenation: nonrebreather mask and BVM Sedatives: see MAR for details Paralytic: succinylcholine Laryngoscope size: Mac 3 Tube size: 7.5 mm Tube type: cuffed Number of attempts: 1 Cricoid pressure: no Cords visualized: yes Post-procedure assessment: chest rise and CO2 detector Breath sounds: equal Cuff inflated: yes Tube secured with: ETT holder Chest x-ray interpreted by me and radiologist. Chest x-ray findings: endotracheal tube in appropriate position Patient tolerance: Patient tolerated the procedure well with no immediate complications.   (including critical care time)  Labs Reviewed  CBC WITH DIFFERENTIAL - Abnormal; Notable for the following:    WBC 15.6 (*)     RBC 2.65 (*)     Hemoglobin 6.3 (*)     HCT 20.7 (*)     MCH 23.8 (*)     RDW 17.4 (*)     Neutrophils Relative 84 (*)     Neutro Abs 13.1 (*)     All other components within normal limits  COMPREHENSIVE METABOLIC PANEL -  Abnormal; Notable for the following:    Sodium 128 (*)     Chloride 89 (*)     Glucose, Bld 159 (*)     BUN 78 (*)     Creatinine, Ser 2.08 (*)     Albumin 1.8 (*)     AST 3556 (*)     ALT 1907 (*)     GFR calc non Af Amer 22 (*)     GFR calc Af Amer 26 (*)     All other components within normal limits  URINALYSIS, ROUTINE W REFLEX MICROSCOPIC - Abnormal; Notable for the following:    APPearance CLOUDY (*)     Leukocytes, UA SMALL (*)     All other components within normal limits  CARDIAC PANEL(CRET KIN+CKTOT+MB+TROPI) - Abnormal; Notable for the following:    Total CK 322 (*)     All other components within normal limits  PROTIME-INR - Abnormal; Notable for the following:    Prothrombin Time 19.6 (*)     INR 1.63 (*)     All other components within normal limits  APTT - Abnormal; Notable for the following:    aPTT 42 (*)     All other components within normal limits  URINE RAPID DRUG SCREEN (HOSP PERFORMED) - Abnormal; Notable for the following:    Opiates POSITIVE (*)     All other components within normal limits  URINE MICROSCOPIC-ADD ON - Abnormal; Notable for the following:    Bacteria, UA FEW (*)     All other components within normal limits  ETHANOL  LACTIC ACID, PLASMA  PROCALCITONIN  URINE CULTURE  CULTURE, BLOOD (ROUTINE X 2)  CULTURE, BLOOD (ROUTINE X 2)  CORTISOL  PREPARE RBC (CROSSMATCH)  TYPE AND SCREEN  BLOOD GAS, ARTERIAL  COMPREHENSIVE METABOLIC PANEL  MAGNESIUM  PHOSPHORUS  AMYLASE  LIPASE, BLOOD  CARDIAC  PANEL(CRET KIN+CKTOT+MB+TROPI)  CARDIAC PANEL(CRET KIN+CKTOT+MB+TROPI)  LACTIC ACID, PLASMA  PROCALCITONIN  PRO B NATRIURETIC PEPTIDE  CORTISOL  CBC  PROTIME-INR  APTT  CULTURE, BLOOD (ROUTINE X 2)  CULTURE, BLOOD (ROUTINE X 2)  URINE CULTURE  URINALYSIS, ROUTINE W REFLEX MICROSCOPIC  CULTURE, RESPIRATORY   Dg Chest Port 1 View  04/19/2012  *RADIOLOGY REPORT*  Clinical Data: Central line and endotracheal tube placement.  PORTABLE CHEST  - 1 VIEW  Comparison: 04/19/2012, 16 at 2 hours.  Findings: Endotracheal tube and nasogastric tube are present.  The endotracheal tube tip is 22 mm from the carina. Consolidation of the left base is present.  Airspace disease at the right lung base is also present.  Cardiomegaly.  Pulmonary aeration appears similar to the exam earlier today.  IMPRESSION: 1.  Unchanged support apparatus, in good position. 2.  Left basilar consolidation and small focus of new right basilar airspace disease.  Differential considerations include pneumonia, aspiration and asymmetric/atypical pulmonary edema.  Original Report Authenticated By: Andreas Newport, M.D.   Dg Chest Port 1 View  04/19/2012  *RADIOLOGY REPORT*  Clinical Data: Intubation.  PORTABLE CHEST - 1 VIEW 04/19/2012 1602 hours:  Comparison: Portable chest x-ray earlier same date 1453 hours.  Findings: Endotracheal tube tip in satisfactory position approximately 3 cm above the carina.  Nasogastric tube courses below the diaphragm into the stomach.  Cardiac silhouette enlarged but stable.  Pulmonary venous hypertension without overt edema. Dense consolidation in the left lower lobe, unchanged.  Mild atelectasis at the right base, unchanged.  No new pulmonary parenchymal abnormalities.  IMPRESSION:  1.  Endotracheal tube tip in satisfactory position approximately 3 cm above the carina. 2.  Nasogastric tube courses below the diaphragm into the stomach. 3.  Stable dense left lower lobe atelectasis and/or pneumonia and mild right basilar atelectasis.  Stable cardiomegaly and pulmonary venous hypertension without overt edema.  Original Report Authenticated By: Arnell Sieving, M.D.   Dg Chest Port 1 View  04/19/2012  *RADIOLOGY REPORT*  Clinical Data: Femoral central line placement.  Sepsis.  PORTABLE CHEST - 1 VIEW  Comparison: 01/13/2012.  Findings: Stable enlargement of the cardiac silhouette.  Increased opacity at the left lung base.  Slight increase in prominence of  the right hilum with a somewhat rounded configuration.  No adenopathy in that region on the CTA dated 06/29/2011.  Linear density in the left mid lung zone.  Unremarkable bones.  IMPRESSION:  1.  Increased left basilar opacity compatible with dense pneumonia or atelectasis. 2.  Linear atelectasis in the left mid lung zone. 3.  Possible right hilar adenopathy. 4.  Stable cardiomegaly.  Original Report Authenticated By: Darrol Angel, M.D.     1. Altered mental status   2. Respiratory failure   3. Hypotension     CRITICAL CARE Performed by: Ranae Palms, Abbee Cremeens   Total critical care time: 45 min Critical care time was exclusive of separately billable procedures and treating other patients.  Critical care was necessary to treat or prevent imminent or life-threatening deterioration.  Critical care was time spent personally by me on the following activities: development of treatment plan with patient and/or surrogate as well as nursing, discussions with consultants, evaluation of patient's response to treatment, examination of patient, obtaining history from patient or surrogate, ordering and performing treatments and interventions, ordering and review of laboratory studies, ordering and review of radiographic studies, pulse oximetry and re-evaluation of patient's condition.    Date: 04/19/2012  Rate: 75  Rhythm: normal  sinus rhythm  QRS Axis: left  Intervals: QRS prolonged  ST/T Wave abnormalities: nonspecific T wave changes  Conduction Disutrbances:right bundle branch block  Narrative Interpretation:   Old EKG Reviewed: changes noted   MDM  Discussed with Dr Kendrick Fries who will see in ED and admit       Loren Racer, MD 04/19/12 1650  Loren Racer, MD 04/19/12 [phone]

## 2012-04-19 NOTE — Progress Notes (Signed)
eLink Physician-Brief Progress Note Patient Name: Holly Kim DOB: 05/05/1941 MRN: [id number]  Date of Service  04/19/2012   HPI/Events of Note    Lab 04/19/12 1451  INR 1.63*    Lab 04/19/12 1451  HGB 6.3*  HCT 20.7*  WBC 15.6*  PLT 321    Lab 04/19/12 1451  HGB 6.3*     eICU Interventions  2 u prbc (give wihtout consent due to lack of family and emergency need)  10mg  sq vit k   Intervention Category Intermediate Interventions: Other:  [name] 04/19/2012, 7:34 PM

## 2012-04-19 NOTE — ED Notes (Signed)
Pt from nursing facility whose family reports altered mental status starting last evening.  Follows commands, grips equal, alert and oriented to person, place, and date.

## 2012-04-20 ENCOUNTER — Inpatient Hospital Stay (HOSPITAL_COMMUNITY): Payer: Medicare Other

## 2012-04-20 ENCOUNTER — Encounter (HOSPITAL_COMMUNITY): Payer: Self-pay

## 2012-04-20 DIAGNOSIS — R740 Nonspecific elevation of levels of transaminase and lactic acid dehydrogenase [LDH]: Secondary | ICD-10-CM

## 2012-04-20 DIAGNOSIS — I2510 Atherosclerotic heart disease of native coronary artery without angina pectoris: Secondary | ICD-10-CM

## 2012-04-20 DIAGNOSIS — R1011 Right upper quadrant pain: Secondary | ICD-10-CM

## 2012-04-20 LAB — URINE MICROSCOPIC-ADD ON

## 2012-04-20 LAB — BLOOD GAS, ARTERIAL
Acid-base deficit: 3.7 mmol/L — ABNORMAL HIGH (ref 0.0–2.0)
Bicarbonate: 21.3 meq/L (ref 20.0–24.0)
Drawn by: 257701
FIO2: 0.3 %
MECHVT: 440 mL
O2 Saturation: 97.2 %
PEEP: 5 cmH2O
Patient temperature: 98.6
RATE: 18 {breaths}/min
TCO2: 18.4 mmol/L (ref 0–100)
pCO2 arterial: 40.1 mmHg (ref 35.0–45.0)
pH, Arterial: 7.344 — ABNORMAL LOW (ref 7.350–7.450)
pO2, Arterial: 110 mmHg — ABNORMAL HIGH (ref 80.0–100.0)

## 2012-04-20 LAB — MAGNESIUM: Magnesium: 2.3 mg/dL (ref 1.5–2.5)

## 2012-04-20 LAB — GLUCOSE, CAPILLARY
Glucose-Capillary: 149 mg/dL — ABNORMAL HIGH (ref 70–99)
Glucose-Capillary: 170 mg/dL — ABNORMAL HIGH (ref 70–99)

## 2012-04-20 LAB — TYPE AND SCREEN
Antibody Screen: NEGATIVE
Unit division: 0

## 2012-04-20 LAB — URINALYSIS, ROUTINE W REFLEX MICROSCOPIC
Glucose, UA: NEGATIVE mg/dL
Ketones, ur: NEGATIVE mg/dL
Nitrite: NEGATIVE
Protein, ur: 30 mg/dL — AB

## 2012-04-20 LAB — CARDIAC PANEL(CRET KIN+CKTOT+MB+TROPI)
CK, MB: 10.8 ng/mL (ref 0.3–4.0)
CK, MB: 15.9 ng/mL (ref 0.3–4.0)
Relative Index: 2.5 (ref 0.0–2.5)
Relative Index: 5 — ABNORMAL HIGH (ref 0.0–2.5)
Relative Index: 6.5 — ABNORMAL HIGH (ref 0.0–2.5)
Total CK: 424 U/L — ABNORMAL HIGH (ref 7–177)
Total CK: 339 U/L — ABNORMAL HIGH (ref 7–177)
Total CK: 245 U/L — ABNORMAL HIGH (ref 7–177)
Troponin I: 1.84 ng/mL (ref <0.30)
Troponin I: 3.62 ng/mL (ref <0.30)
Troponin I: 3.79 ng/mL

## 2012-04-20 LAB — CBC
HCT: 28.4 % — ABNORMAL LOW (ref 36.0–46.0)
Hemoglobin: 9 g/dL — ABNORMAL LOW (ref 12.0–15.0)
MCHC: 31.7 g/dL (ref 30.0–36.0)
MCV: 78.2 fL (ref 78.0–100.0)
RDW: 17.1 % — ABNORMAL HIGH (ref 11.5–15.5)

## 2012-04-20 LAB — COMPREHENSIVE METABOLIC PANEL
Albumin: 1.8 g/dL — ABNORMAL LOW (ref 3.5–5.2)
BUN: 70 mg/dL — ABNORMAL HIGH (ref 6–23)
CO2: 21 mEq/L (ref 19–32)
Chloride: 100 mEq/L (ref 96–112)
Creatinine, Ser: 1.69 mg/dL — ABNORMAL HIGH (ref 0.50–1.10)
GFR calc Af Amer: 34 mL/min — ABNORMAL LOW (ref >90)
GFR calc non Af Amer: 29 mL/min — ABNORMAL LOW (ref >90)
Glucose, Bld: 165 mg/dL — ABNORMAL HIGH (ref 70–99)
Total Bilirubin: 0.6 mg/dL (ref 0.3–1.2)

## 2012-04-20 LAB — CARBOXYHEMOGLOBIN
Methemoglobin: 1 % (ref 0.0–1.5)
Total hemoglobin: 9.1 g/dL — ABNORMAL LOW (ref 12.0–16.0)

## 2012-04-20 LAB — PHOSPHORUS: Phosphorus: 4.7 mg/dL — ABNORMAL HIGH (ref 2.3–4.6)

## 2012-04-20 LAB — LACTIC ACID, PLASMA: Lactic Acid, Venous: 1.2 mmol/L (ref 0.5–2.2)

## 2012-04-20 MED ORDER — VANCOMYCIN HCL 1000 MG IV SOLR
1750.0000 mg | INTRAVENOUS | Status: DC
  Filled 2012-04-20: qty 1750

## 2012-04-20 MED ORDER — PRO-STAT SUGAR FREE PO LIQD
30.0000 mL | Freq: Three times a day (TID) | ORAL | Status: DC
  Administered 2012-04-20 – 2012-04-21 (×2): 30 mL
  Filled 2012-04-20 (×5): qty 30

## 2012-04-20 MED ORDER — PROMOTE PO LIQD
1000.0000 mL | ORAL | Status: DC
  Administered 2012-04-20: 1000 mL
  Filled 2012-04-20: qty 1000

## 2012-04-20 MED ORDER — VANCOMYCIN HCL 1000 MG IV SOLR
1750.0000 mg | INTRAVENOUS | Status: DC
  Administered 2012-04-20: 1750 mg via INTRAVENOUS
  Filled 2012-04-20 (×2): qty 1750

## 2012-04-20 MED ORDER — FENTANYL CITRATE 0.05 MG/ML IJ SOLN
25.0000 ug | INTRAMUSCULAR | Status: DC | PRN
  Administered 2012-04-20: 100 ug via INTRAVENOUS
  Administered 2012-04-20 (×2): 50 ug via INTRAVENOUS
  Administered 2012-04-20: 100 ug via INTRAVENOUS
  Administered 2012-04-20: 50 ug via INTRAVENOUS
  Administered 2012-04-21 (×2): 100 ug via INTRAVENOUS
  Filled 2012-04-20 (×6): qty 2

## 2012-04-20 MED ORDER — FUROSEMIDE 10 MG/ML IJ SOLN
60.0000 mg | Freq: Once | INTRAMUSCULAR | Status: AC
  Administered 2012-04-20: 60 mg via INTRAVENOUS
  Filled 2012-04-20: qty 6

## 2012-04-20 MED ORDER — CHLORHEXIDINE GLUCONATE 0.12 % MT SOLN
15.0000 mL | Freq: Two times a day (BID) | OROMUCOSAL | Status: DC
  Administered 2012-04-20 – 2012-04-21 (×4): 15 mL via OROMUCOSAL
  Filled 2012-04-20 (×4): qty 15

## 2012-04-20 MED ORDER — BIOTENE DRY MOUTH MT LIQD
15.0000 mL | Freq: Four times a day (QID) | OROMUCOSAL | Status: DC
  Administered 2012-04-20 – 2012-04-25 (×20): 15 mL via OROMUCOSAL

## 2012-04-20 MED ORDER — MIDAZOLAM HCL 5 MG/ML IJ SOLN
INTRAMUSCULAR | Status: AC
  Filled 2012-04-20: qty 1

## 2012-04-20 MED ORDER — METOPROLOL TARTRATE 1 MG/ML IV SOLN
INTRAVENOUS | Status: AC
  Filled 2012-04-20: qty 5

## 2012-04-20 MED ORDER — SODIUM CHLORIDE 0.9 % IV SOLN
1.0000 g | Freq: Two times a day (BID) | INTRAVENOUS | Status: DC
  Administered 2012-04-20: 1 g via INTRAVENOUS
  Filled 2012-04-20 (×2): qty 1

## 2012-04-20 MED ORDER — METOPROLOL TARTRATE 1 MG/ML IV SOLN
2.5000 mg | INTRAVENOUS | Status: DC | PRN
  Administered 2012-04-20: 2.5 mg via INTRAVENOUS
  Administered 2012-04-22 – 2012-04-23 (×3): 5 mg via INTRAVENOUS
  Filled 2012-04-20 (×4): qty 5

## 2012-04-20 NOTE — Progress Notes (Addendum)
Pharmacy: Vancomycin/Meropenem   Vancomycin changed to 1750 mg IV q24h, Meropenem changed to 1gm IV q12h - based on Weight of 126 kg and improving Scr (CrCl 39 ml/min).  Cultures pending.  Will continue to follow cultures and renal function.   *It is unclear if the patient received full loading dose yesterday as only 1 gram is charted as given.  Given this patients weight of 126 kg will give today's vancomycin early in effort to get vancomycin therapeutic.  Will check vancomycin trough soon to avoid elevated levels.   Tram Wrenn, Loma Messing PharmD 12:13 PM 04/20/2012

## 2012-04-20 NOTE — Progress Notes (Signed)
eLink Physician-Brief Progress Note Patient Name: Holly Kim DOB: Jul 27, 1941 MRN: [id number]  Date of Service  04/20/2012   HPI/Events of Note     eICU Interventions  Lopressor for AF/ RVR Off dobutamine & pressors x 3h   Intervention Category Intermediate Interventions: Arrhythmia - evaluation and management  [name] V. 04/20/2012, 4:39 PM

## 2012-04-20 NOTE — Procedures (Signed)
Central Venous Catheter Insertion Procedure Note ROSEALEE RECINOS [id number] 05/07/1941  Procedure: Insertion of Central Venous Catheter Indications: Assessment of intravascular volume, Drug and/or fluid administration and Frequent blood sampling  Procedure Details Consent: Unable to obtain consent because of emergent medical necessity. Time Out: Verified patient identification, verified procedure, site/side was marked, verified correct patient position, special equipment/implants available, medications/allergies/relevent history reviewed, required imaging and test results available.  Performed  Maximum sterile technique was used including antiseptics, cap, gloves, gown, hand hygiene, mask and sheet. Skin prep: Chlorhexidine; local anesthetic administered A antimicrobial bonded/coated triple lumen catheter was placed in the right internal jugular vein using the Seldinger technique.  Evaluation Blood flow good Complications: No apparent complications Patient did tolerate procedure well. Chest X-ray ordered to verify placement.  CXR: normal.  Overton Mam, M.D. Pulmonary and Critical Care Medicine Call E-link with questions [phone] 04/20/2012, 12:54 AM

## 2012-04-20 NOTE — Consult Note (Signed)
Cardiology Consult Note   Reason for consult: Elevated troponin  History of Present Illness (and review of medical records): Holly Kim is a 71 y.o. female who is admitted to the ICU after presenting from nursing home with altered mental status.  HPI is obtained from medical records as it is limited from patient who is still intubated.  Of note she has hx of CAD s/p PCI, HTN, DM, COPD, PE/DVT not currently anticoagulated with recent admissions to hospital in Jan and April of this year currently in SNF.  After presenting with AMS she acutely decompensated in the ED requiring emergent intubation.  Initial evaluation in ED revealed critically ill patient in Septic shock, Acute respiratory failure, HCAP, UTI, Severe anemia, along with acute renal and liver failure.  Initial trop was negative x 2.  Third trop was 1.84 and last collected is 3.79.  We were consulted for assistance with management.  Of note, patient last had coronary Cath in 08/2011 which revealed obstructive 3 vessel CAD as below.  She underwent POBA of the LCx.  She was not felt to be CABG candidate and continued on aggressive medical therapy.  Coronary angiography:  Coronary dominance: right  Left mainstem: The vessel is normal in size and mildly calcified. There is a 40% distal stenosis at the bifurcation.  Left anterior descending (LAD): The vessel is heavily calcified in the proximal and midsegment. There is diffuse 30% disease proximally. It appears that there is a stent in the midsegment which is patent with minimal restenosis. There is mild diffuse disease distally followed by a 70% stenosis close to the apex. First diagonal is small in size with 90% proximal disease. Second diagonal is normal in size with 80% diffuse proximal disease. Her diagonal is very small in size.  Left circumflex (LCx): The vessel is normal in size overall. It's moderately calcified in the proximal segment. There is an 80% ostial stenosis. In the  proximal segment there is a 90% stenosis. There are 2 filling defects suggestive of a thrombus versus calcifications. The stent is noted distal to the stenosis which appears to be patent without significant restenosis. The rest of the vessel is free of significant disease.  Right coronary artery (RCA): The vessel is normal in size and dominant. It's moderately calcified in the proximal and midsegment. The stent is noted in the proximal area extending all the way back to the ostium there is 50% in-stent restenosis. After the stent there is diffuse 30% disease. There is a 40-50% stenosis in the distal vessel after a large acute marginal branch. The rest of the vessel has mild atherosclerosis without obstructive disease  Left ventriculography: Left ventricular systolic function is low normal, LVEF is estimated at 50%, there is no significant mitral regurgitation   Review of Systems Unobtainable due to patient being intubated.  Past Medical History  Diagnosis Date  . PE (pulmonary embolism)   . DVT (deep venous thrombosis)   . Diabetes mellitus   . Asthma   . Hypertension   . CAD (coronary artery disease)     RCA ostial DES 9/11, Circumflex ostial PTCA only 09/11/11.  Marland Kitchen GERD (gastroesophageal reflux disease)   . COPD (chronic obstructive pulmonary disease)   . Obesity   . Tardive dyskinesia   . History of renal failure   . Depression   . Borderline personality disorder   . Anxiety   . Anemia     hx of blood transfusion x 3  . Arthritis   .  Peripheral neuropathy   . Shortness of breath     at rest    Past Surgical History  Procedure Date  . US echocardiography 2008  . Orif ankle fracture   . Appendectomy   . Cervical discectomy   . Ptca 06/2010  . Tonsillectomy   . Transthoracic echocardiogram 06/2010    Grade 1 diastolic, EF 55-60  . Esophagogastroduodenoscopy 01/21/2012    Procedure: ESOPHAGOGASTRODUODENOSCOPY (EGD);  Surgeon: Theda Belfast, MD;  Location: [facility] ENDOSCOPY;  Service:  Endoscopy;  Laterality: N/A;    Prescriptions prior to admission  Medication Sig Dispense Refill  . aspirin 81 MG EC tablet Take 81 mg by mouth daily.        Marland Kitchen atorvastatin (LIPITOR) 40 MG tablet Take 40 mg by mouth daily.      . bisoprolol (ZEBETA) 5 MG tablet Take 5 mg by mouth daily.        . clonazePAM (KLONOPIN) 0.5 MG tablet Take 0.25 mg by mouth 2 (two) times daily. Take 1/2 tablet twice daily      . clopidogrel (PLAVIX) 75 MG tablet Take 75 mg by mouth daily.        Marland Kitchen docusate sodium (COLACE) 100 MG capsule Take 100 mg by mouth 2 (two) times daily.      . ferrous sulfate 325 (65 FE) MG tablet Take 325 mg by mouth daily with breakfast.      . insulin aspart (NOVOLOG) 100 UNIT/ML injection Inject 3-24 Units into the skin 3 (three) times daily before meals. 16 Units with breakfast; 24 Units with lunch; 24 Units with supper Inject 3 units if CBG is greater than or equal to 150 before meals and at bedtime.      . insulin glargine (LANTUS) 100 UNIT/ML injection Inject 50 Units into the skin at bedtime.      Marland Kitchen lisinopril (PRINIVIL,ZESTRIL) 5 MG tablet Take 5 mg by mouth daily.      Marland Kitchen LORazepam (ATIVAN) 1 MG tablet Take 1 mg by mouth every 8 (eight) hours as needed. For anxiety      . morphine (MS CONTIN) 15 MG 12 hr tablet Take 15 mg by mouth 3 (three) times daily.      . Multiple Vitamin (MULTIVITAMIN WITH MINERALS) TABS Take 1 tablet by mouth daily.      . nitroGLYCERIN (NITROSTAT) 0.4 MG SL tablet Place 0.4 mg under the tongue every 5 (five) minutes as needed. For chest pain      . omeprazole (PRILOSEC) 20 MG capsule Take 40 mg by mouth daily.      Marland Kitchen oxyCODONE (OXY IR/ROXICODONE) 5 MG immediate release tablet Take 10 mg by mouth every 4 (four) hours as needed. pain      . promethazine (PHENERGAN) 12.5 MG tablet Take 12.5 mg by mouth every 6 (six) hours as needed. For nausea      . senna (SENOKOT) 8.6 MG tablet Take 3 tablets by mouth daily.        . sucralfate (CARAFATE) 1 G tablet Take 1 g  by mouth 3 (three) times daily before meals.        . tiotropium (SPIRIVA) 18 MCG inhalation capsule Place 18 mcg into inhaler and inhale daily.      Marland Kitchen torsemide (DEMADEX) 20 MG tablet Take 20 mg by mouth daily.        Marland Kitchen venlafaxine (EFFEXOR) 75 MG tablet Take 75 mg by mouth daily.      . cefUROXime (CEFTIN) 500 MG tablet  Take 500 mg by mouth 2 (two) times daily.      Marland Kitchen saccharomyces boulardii (FLORASTOR) 250 MG capsule Take 250 mg by mouth 2 (two) times daily.       Allergies  Allergen Reactions  . Ibuprofen     REACTION: "mouth swells up and I can't swallow good"  . Amlodipine Besylate Other (See Comments)    REACTION: hypotension  . Amoxicillin Itching  . Augmentin (Amoxicillin-Pot Clavulanate) Other (See Comments)    unknown  . Codeine Itching  . Cortisone Other (See Comments)    Pt states she gets "blood poisoning where they give me the shots of cortisone"  . Haloperidol Lactate Other (See Comments)    REACTION: My head gets places before my body  . Meclizine Other (See Comments)    unknown  . Metoclopramide Hcl     Shaking   . Nsaids Other (See Comments)    unknown  . Naproxen Rash    History  Substance Use Topics  . Smoking status: Passive Smoker  . Smokeless tobacco: Never Used   Comment: never smoked - children smoke - lives with son.  . Alcohol Use: No    Family History  Problem Relation Age of Onset  . Breast cancer      aunt  . Alcohol abuse Son   . Cancer Daughter   . Leukemia Father     deceased  . Alzheimer's disease Mother     deceased     Objective: Patient Vitals for the past 8 hrs:  BP Temp Pulse Resp SpO2  04/20/12 1947 103/98 mmHg - 90  25  100 %  04/20/12 1830 126/61 mmHg 98.8 F (37.1 C) 87  18  100 %  04/20/12 1800 120/52 mmHg 98.8 F (37.1 C) 181  19  94 %  04/20/12 1745 105/39 mmHg 98.8 F (37.1 C) 57  17  100 %  04/20/12 1730 99/61 mmHg 98.8 F (37.1 C) 90  18  100 %  04/20/12 1715 - 99 F (37.2 C) 90  19  100 %  04/20/12 1700  109/54 mmHg 99 F (37.2 C) 94  19  100 %  04/20/12 1645 88/53 mmHg - 138  21  100 %  04/20/12 1630 90/49 mmHg - 145  18  100 %  04/20/12 1615 133/103 mmHg 98.6 F (37 C) 108  20  100 %  04/20/12 1600 118/42 mmHg 98.6 F (37 C) 88  16  -  04/20/12 1530 108/85 mmHg 98.8 F (37.1 C) 97  19  -  04/20/12 1500 120/51 mmHg 99.1 F (37.3 C) 95  19  100 %  04/20/12 1445 115/38 mmHg 99.1 F (37.3 C) 96  20  100 %  04/20/12 1430 113/45 mmHg - - 25  100 %  04/20/12 1415 107/54 mmHg - - 21  100 %  04/20/12 1400 84/35 mmHg - - - -  04/20/12 1345 - 98.2 F (36.8 C) 86  17  88 %   General Appearance:    Opens eyes to voice, in NAD  Head:    Normocephalic, without obvious abnormality, atraumatic  Eyes:     PERRL, EOMI, anicteric sclerae  Neck:   Supple  Lungs:     Course breath sounds over vent  Heart:    RRR no murmurs detected  Abdomen:     Soft, non-tender, normoactive bowel sounds  Extremities:   1-2+ BLE edema  Pulses:   2+ and symmetric all extremities  Skin:  warm  Neurologic:   Awake and alert   Results for orders placed during the hospital encounter of 04/19/12 (from the past 48 hour(s))  CORTISOL     Status: Normal   Collection Time   04/19/12  2:50 PM      Component Value Range Comment   Cortisol, Plasma 17.8     PROCALCITONIN     Status: Normal   Collection Time   04/19/12  2:50 PM      Component Value Range Comment   Procalcitonin 0.97     CBC WITH DIFFERENTIAL     Status: Abnormal   Collection Time   04/19/12  2:51 PM      Component Value Range Comment   WBC 15.6 (*) 4.0 - 10.5 K/uL    RBC 2.65 (*) 3.87 - 5.11 MIL/uL    Hemoglobin 6.3 (*) 12.0 - 15.0 g/dL    HCT 40.9 (*) 81.1 - 46.0 %    MCV 78.1  78.0 - 100.0 fL    MCH 23.8 (*) 26.0 - 34.0 pg    MCHC 30.4  30.0 - 36.0 g/dL    RDW 91.4 (*) 78.2 - 15.5 %    Platelets 321  150 - 400 K/uL    Neutrophils Relative 84 (*) 43 - 77 %    Neutro Abs 13.1 (*) 1.7 - 7.7 K/uL    Lymphocytes Relative 12  12 - 46 %    Lymphs Abs  1.9  0.7 - 4.0 K/uL    Monocytes Relative 4  3 - 12 %    Monocytes Absolute 0.6  0.1 - 1.0 K/uL    Eosinophils Relative 0  0 - 5 %    Eosinophils Absolute 0.0  0.0 - 0.7 K/uL    Basophils Relative 0  0 - 1 %    Basophils Absolute 0.0  0.0 - 0.1 K/uL   COMPREHENSIVE METABOLIC PANEL     Status: Abnormal   Collection Time   04/19/12  2:51 PM      Component Value Range Comment   Sodium 128 (*) 135 - 145 mEq/L    Potassium 5.1  3.5 - 5.1 mEq/L    Chloride 89 (*) 96 - 112 mEq/L    CO2 29  19 - 32 mEq/L    Glucose, Bld 159 (*) 70 - 99 mg/dL    BUN 78 (*) 6 - 23 mg/dL    Creatinine, Ser 9.56 (*) 0.50 - 1.10 mg/dL    Calcium 8.8  8.4 - 21.3 mg/dL    Total Protein 6.0  6.0 - 8.3 g/dL    Albumin 1.8 (*) 3.5 - 5.2 g/dL    AST 0865 (*) 0 - 37 U/L    ALT 1907 (*) 0 - 35 U/L    Alkaline Phosphatase 91  39 - 117 U/L    Total Bilirubin 0.3  0.3 - 1.2 mg/dL    GFR calc non Af Amer 22 (*) >90 mL/min    GFR calc Af Amer 26 (*) >90 mL/min   CARDIAC PANEL(CRET KIN+CKTOT+MB+TROPI)     Status: Abnormal   Collection Time   04/19/12  2:51 PM      Component Value Range Comment   Total CK 322 (*) 7 - 177 U/L    CK, MB 2.1  0.3 - 4.0 ng/mL    Troponin I <0.30  <0.30 ng/mL    Relative Index 0.7  0.0 - 2.5   PROTIME-INR     Status: Abnormal  Collection Time   04/19/12  2:51 PM      Component Value Range Comment   Prothrombin Time 19.6 (*) 11.6 - 15.2 seconds    INR 1.63 (*) 0.00 - 1.49   APTT     Status: Abnormal   Collection Time   04/19/12  2:51 PM      Component Value Range Comment   aPTT 42 (*) 24 - 37 seconds   ETHANOL     Status: Normal   Collection Time   04/19/12  2:51 PM      Component Value Range Comment   Alcohol, Ethyl (B) <11  0 - 11 mg/dL   LACTIC ACID, PLASMA     Status: Normal   Collection Time   04/19/12  2:51 PM      Component Value Range Comment   Lactic Acid, Venous 1.9  0.5 - 2.2 mmol/L   CULTURE, BLOOD (ROUTINE X 2)     Status: Normal (Preliminary result)   Collection Time    04/19/12  3:12 PM      Component Value Range Comment   Specimen Description BLOOD RIGHT ARM  3 ML IN [facility] BOTTLE      Special Requests NONE      Culture  Setup Time 04/19/2012 22:25      Culture        Value:        BLOOD CULTURE RECEIVED NO GROWTH TO DATE CULTURE WILL BE HELD FOR 5 DAYS BEFORE ISSUING A FINAL NEGATIVE REPORT   Report Status PENDING     URINALYSIS, ROUTINE W REFLEX MICROSCOPIC     Status: Abnormal   Collection Time   04/19/12  3:34 PM      Component Value Range Comment   Color, Urine YELLOW  YELLOW    APPearance CLOUDY (*) CLEAR    Specific Gravity, Urine 1.019  1.005 - 1.030    pH 5.0  5.0 - 8.0    Glucose, UA NEGATIVE  NEGATIVE mg/dL    Hgb urine dipstick NEGATIVE  NEGATIVE    Bilirubin Urine NEGATIVE  NEGATIVE    Ketones, ur NEGATIVE  NEGATIVE mg/dL    Protein, ur NEGATIVE  NEGATIVE mg/dL    Urobilinogen, UA 0.2  0.0 - 1.0 mg/dL    Nitrite NEGATIVE  NEGATIVE    Leukocytes, UA SMALL (*) NEGATIVE   URINE MICROSCOPIC-ADD ON     Status: Abnormal   Collection Time   04/19/12  3:34 PM      Component Value Range Comment   Squamous Epithelial / LPF RARE  RARE    WBC, UA 0-2  <3 WBC/hpf    Bacteria, UA FEW (*) RARE    Urine-Other FEW YEAST     URINE RAPID DRUG SCREEN (HOSP PERFORMED)     Status: Abnormal   Collection Time   04/19/12  3:35 PM      Component Value Range Comment   Opiates POSITIVE (*) NONE DETECTED    Cocaine NONE DETECTED  NONE DETECTED    Benzodiazepines NONE DETECTED  NONE DETECTED    Amphetamines NONE DETECTED  NONE DETECTED    Tetrahydrocannabinol NONE DETECTED  NONE DETECTED    Barbiturates NONE DETECTED  NONE DETECTED   PREPARE RBC (CROSSMATCH)     Status: Normal   Collection Time   04/19/12  4:00 PM      Component Value Range Comment   Order Confirmation ORDER PROCESSED BY BLOOD BANK     TYPE AND SCREEN  Status: Normal   Collection Time   04/19/12  4:20 PM      Component Value Range Comment   ABO/RH(D) A NEG      Antibody Screen NEG       Sample Expiration 04/22/2012      Unit Number [id number]      Blood Component Type RED CELLS,LR      Unit division 00      Status of Unit ISSUED,FINAL      Transfusion Status OK TO TRANSFUSE      Crossmatch Result Compatible      Unit Number [id number]      Blood Component Type RED CELLS,LR      Unit division 00      Status of Unit ISSUED,FINAL      Transfusion Status OK TO TRANSFUSE      Crossmatch Result Compatible     CARDIAC PANEL(CRET KIN+CKTOT+MB+TROPI)     Status: Abnormal   Collection Time   04/19/12  4:20 PM      Component Value Range Comment   Total CK 395 (*) 7 - 177 U/L    CK, MB 1.7  0.3 - 4.0 ng/mL    Troponin I <0.30  <0.30 ng/mL    Relative Index 0.4  0.0 - 2.5   PROCALCITONIN     Status: Normal   Collection Time   04/19/12  4:20 PM      Component Value Range Comment   Procalcitonin 0.88     PRO B NATRIURETIC PEPTIDE     Status: Abnormal   Collection Time   04/19/12  4:20 PM      Component Value Range Comment   Pro B Natriuretic peptide (BNP) 21491.0 (*) 0 - 125 pg/mL   CORTISOL     Status: Normal   Collection Time   04/19/12  4:20 PM      Component Value Range Comment   Cortisol, Plasma 18.3     ABO/RH     Status: Normal   Collection Time   04/19/12  4:20 PM      Component Value Range Comment   ABO/RH(D) A NEG     BLOOD GAS, ARTERIAL     Status: Abnormal   Collection Time   04/19/12  5:20 PM      Component Value Range Comment   FIO2 0.50      Delivery systems VENTILATOR      Mode PRESSURE REGULATED VOLUME CONTROL      VT 550      Rate 14      Peep/cpap 5.0      pH, Arterial 7.288 (*) 7.350 - 7.450    pCO2 arterial 50.1 (*) 35.0 - 45.0 mmHg    pO2, Arterial 213.0 (*) 80.0 - 100.0 mmHg    Bicarbonate 23.6  20.0 - 24.0 mEq/L    TCO2 23.5  0 - 100 mmol/L    Acid-base deficit 2.5 (*) 0.0 - 2.0 mmol/L    O2 Saturation 99.3      Patient temperature 96.5      Collection site LEFT RADIAL      Drawn by ARTERIAL DRAW      Sample type COLLECTED BY RT      Allens  test (pass/fail) PASS  PASS   MRSA PCR SCREENING     Status: Normal   Collection Time   04/19/12  6:39 PM      Component Value Range Comment   MRSA by PCR NEGATIVE  NEGATIVE  GLUCOSE, CAPILLARY     Status: Abnormal   Collection Time   04/19/12  6:54 PM      Component Value Range Comment   Glucose-Capillary 177 (*) 70 - 99 mg/dL    Comment 1 Documented in Chart      Comment 2 Notify RN     GLUCOSE, CAPILLARY     Status: Abnormal   Collection Time   04/19/12  8:02 PM      Component Value Range Comment   Glucose-Capillary 155 (*) 70 - 99 mg/dL    Comment 1 Notify RN     CARBOXYHEMOGLOBIN     Status: Abnormal   Collection Time   04/19/12 11:28 PM      Component Value Range Comment   Total hemoglobin 8.6 (*) 12.0 - 16.0 g/dL    O2 Saturation 16.1      Carboxyhemoglobin 1.4  0.5 - 1.5 %    Methemoglobin 0.9  0.0 - 1.5 %   GLUCOSE, CAPILLARY     Status: Abnormal   Collection Time   04/19/12 11:43 PM      Component Value Range Comment   Glucose-Capillary 162 (*) 70 - 99 mg/dL    Comment 1 Notify RN     COMPREHENSIVE METABOLIC PANEL     Status: Abnormal   Collection Time   04/20/12  3:30 AM      Component Value Range Comment   Sodium 133 (*) 135 - 145 mEq/L    Potassium 4.6  3.5 - 5.1 mEq/L    Chloride 100  96 - 112 mEq/L    CO2 21  19 - 32 mEq/L    Glucose, Bld 165 (*) 70 - 99 mg/dL    BUN 70 (*) 6 - 23 mg/dL    Creatinine, Ser 0.96 (*) 0.50 - 1.10 mg/dL    Calcium 7.6 (*) 8.4 - 10.5 mg/dL    Total Protein 5.9 (*) 6.0 - 8.3 g/dL    Albumin 1.8 (*) 3.5 - 5.2 g/dL    AST 0454 (*) 0 - 37 U/L    ALT 2035 (*) 0 - 35 U/L    Alkaline Phosphatase 143 (*) 39 - 117 U/L    Total Bilirubin 0.6  0.3 - 1.2 mg/dL    GFR calc non Af Amer 29 (*) >90 mL/min    GFR calc Af Amer 34 (*) >90 mL/min   MAGNESIUM     Status: Normal   Collection Time   04/20/12  3:30 AM      Component Value Range Comment   Magnesium 2.3  1.5 - 2.5 mg/dL   PHOSPHORUS     Status: Abnormal   Collection Time   04/20/12  3:30  AM      Component Value Range Comment   Phosphorus 4.7 (*) 2.3 - 4.6 mg/dL   AMYLASE     Status: Normal   Collection Time   04/20/12  3:30 AM      Component Value Range Comment   Amylase 8  0 - 105 U/L   LIPASE, BLOOD     Status: Abnormal   Collection Time   04/20/12  3:30 AM      Component Value Range Comment   Lipase 5 (*) 11 - 59 U/L   CARDIAC PANEL(CRET KIN+CKTOT+MB+TROPI)     Status: Abnormal   Collection Time   04/20/12  3:30 AM      Component Value Range Comment   Total CK 424 (*) 7 - 177 U/L  CK, MB 10.8 (*) 0.3 - 4.0 ng/mL    Troponin I 1.84 (*) <0.30 ng/mL    Relative Index 2.5  0.0 - 2.5   LACTIC ACID, PLASMA     Status: Normal   Collection Time   04/20/12  3:30 AM      Component Value Range Comment   Lactic Acid, Venous 1.2  0.5 - 2.2 mmol/L   CBC     Status: Abnormal   Collection Time   04/20/12  3:30 AM      Component Value Range Comment   WBC 13.8 (*) 4.0 - 10.5 K/uL    RBC 3.63 (*) 3.87 - 5.11 MIL/uL    Hemoglobin 9.0 (*) 12.0 - 15.0 g/dL    HCT 16.1 (*) 09.6 - 46.0 %    MCV 78.2  78.0 - 100.0 fL    MCH 24.8 (*) 26.0 - 34.0 pg    MCHC 31.7  30.0 - 36.0 g/dL    RDW 04.5 (*) 40.9 - 15.5 %    Platelets 291  150 - 400 K/uL   PROTIME-INR     Status: Abnormal   Collection Time   04/20/12  3:30 AM      Component Value Range Comment   Prothrombin Time 19.7 (*) 11.6 - 15.2 seconds    INR 1.64 (*) 0.00 - 1.49   APTT     Status: Normal   Collection Time   04/20/12  3:30 AM      Component Value Range Comment   aPTT 36  24 - 37 seconds   FIBRINOGEN     Status: Normal   Collection Time   04/20/12  3:30 AM      Component Value Range Comment   Fibrinogen 475  204 - 475 mg/dL   URINALYSIS, ROUTINE W REFLEX MICROSCOPIC     Status: Abnormal   Collection Time   04/20/12  3:49 AM      Component Value Range Comment   Color, Urine YELLOW  YELLOW    APPearance CLOUDY (*) CLEAR    Specific Gravity, Urine 1.021  1.005 - 1.030    pH 5.0  5.0 - 8.0    Glucose, UA NEGATIVE  NEGATIVE  mg/dL    Hgb urine dipstick SMALL (*) NEGATIVE    Bilirubin Urine SMALL (*) NEGATIVE    Ketones, ur NEGATIVE  NEGATIVE mg/dL    Protein, ur 30 (*) NEGATIVE mg/dL    Urobilinogen, UA 0.2  0.0 - 1.0 mg/dL    Nitrite NEGATIVE  NEGATIVE    Leukocytes, UA LARGE (*) NEGATIVE   URINE MICROSCOPIC-ADD ON     Status: Abnormal   Collection Time   04/20/12  3:49 AM      Component Value Range Comment   WBC, UA TOO NUMEROUS TO COUNT  <3 WBC/hpf    RBC / HPF 3-6  <3 RBC/hpf    Bacteria, UA FEW (*) RARE    Casts HYALINE CASTS (*) NEGATIVE    Urine-Other MUCOUS PRESENT   MANY YEAST  CARBOXYHEMOGLOBIN     Status: Abnormal   Collection Time   04/20/12  3:55 AM      Component Value Range Comment   Total hemoglobin 9.1 (*) 12.0 - 16.0 g/dL    O2 Saturation 81.1      Carboxyhemoglobin 1.3  0.5 - 1.5 %    Methemoglobin 0.9  0.0 - 1.5 %   GLUCOSE, CAPILLARY     Status: Abnormal   Collection Time  04/20/12  4:37 AM      Component Value Range Comment   Glucose-Capillary 138 (*) 70 - 99 mg/dL    Comment 1 Notify RN     CARBOXYHEMOGLOBIN     Status: Abnormal   Collection Time   04/20/12  5:10 AM      Component Value Range Comment   Total hemoglobin 9.1 (*) 12.0 - 16.0 g/dL    O2 Saturation 16.1      Carboxyhemoglobin 1.5  0.5 - 1.5 %    Methemoglobin 1.0  0.0 - 1.5 %   GLUCOSE, CAPILLARY     Status: Abnormal   Collection Time   04/20/12  7:41 AM      Component Value Range Comment   Glucose-Capillary 149 (*) 70 - 99 mg/dL    Comment 1 Documented in Chart      Comment 2 Notify RN     CULTURE, RESPIRATORY     Status: Normal (Preliminary result)   Collection Time   04/20/12  8:01 AM      Component Value Range Comment   Specimen Description TRACHEAL ASPIRATE      Special Requests Normal      Gram Stain        Value: RARE WBC PRESENT, PREDOMINANTLY PMN     NO SQUAMOUS EPITHELIAL CELLS SEEN     NO ORGANISMS SEEN   Culture PENDING      Report Status PENDING     BLOOD GAS, ARTERIAL     Status: Abnormal    Collection Time   04/20/12 10:10 AM      Component Value Range Comment   FIO2 0.30      Delivery systems VENTILATOR      Mode PRESSURE REGULATED VOLUME CONTROL      VT 440      Rate 18      Peep/cpap 5.0      pH, Arterial 7.344 (*) 7.350 - 7.450    pCO2 arterial 40.1  35.0 - 45.0 mmHg    pO2, Arterial 110.0 (*) 80.0 - 100.0 mmHg    Bicarbonate 21.3  20.0 - 24.0 mEq/L    TCO2 18.4  0 - 100 mmol/L    Acid-base deficit 3.7 (*) 0.0 - 2.0 mmol/L    O2 Saturation 97.2      Patient temperature 98.6      Collection site ARTERIAL LINE      Drawn by [id number]      Sample type ARTERIAL DRAW     CARDIAC PANEL(CRET KIN+CKTOT+MB+TROPI)     Status: Abnormal   Collection Time   04/20/12 11:30 AM      Component Value Range Comment   Total CK 339 (*) 7 - 177 U/L    CK, MB 17.0 (*) 0.3 - 4.0 ng/mL CRITICAL VALUE NOTED.  VALUE IS CONSISTENT WITH PREVIOUSLY REPORTED AND CALLED VALUE.   Troponin I 3.62 (*) <0.30 ng/mL    Relative Index 5.0 (*) 0.0 - 2.5   GLUCOSE, CAPILLARY     Status: Abnormal   Collection Time   04/20/12  1:50 PM      Component Value Range Comment   Glucose-Capillary 159 (*) 70 - 99 mg/dL    Comment 1 Documented in Chart      Comment 2 Notify RN     GLUCOSE, CAPILLARY     Status: Abnormal   Collection Time   04/20/12  4:11 PM      Component Value Range Comment   Glucose-Capillary 170 (*)  70 - 99 mg/dL   CARDIAC PANEL(CRET KIN+CKTOT+MB+TROPI)     Status: Abnormal   Collection Time   04/20/12  7:15 PM      Component Value Range Comment   Total CK 245 (*) 7 - 177 U/L    CK, MB 15.9 (*) 0.3 - 4.0 ng/mL CRITICAL VALUE NOTED.  VALUE IS CONSISTENT WITH PREVIOUSLY REPORTED AND CALLED VALUE.   Troponin I 3.79 (*) <0.30 ng/mL    Relative Index 6.5 (*) 0.0 - 2.5    Dg Abd 1 View  04/20/2012  *RADIOLOGY REPORT*  Clinical Data: Both G tube placement for feeding  ABDOMEN - 1 VIEW  Comparison: None.  Findings: A supine portable film shows the tip of the NG tube to be in the region of the distal  antrum - duodenal bulb.  The bowel gas pattern is nonspecific.  IMPRESSION: Tip of NG tube in the duodenal bulb or distal antrum.  Original Report Authenticated By: Juline Patch, M.D.   US Abdomen Complete  04/20/2012  *RADIOLOGY REPORT*  Clinical Data:  Acute liver failure  ABDOMINAL ULTRASOUND COMPLETE  Comparison:  12/15/2009 CT abdomen  Findings:  Gallbladder:  Small echogenic shadowing gallstones noted within the gallbladder.  Gallbladder wall is thickened measuring 9.2 mm. Additionally, there is pericholecystic fluid.  Despite these findings, no elicited Murphy's sign demonstrated.  Findings remain nonspecific.  Cholecystitis not excluded.  Other considerations include chronic liver disease, hypoproteinemia, or CHF.  Common Bile Duct:  Within normal limits in caliber.  Liver: No focal mass lesion identified.  Within normal limits in parenchymal echogenicity.  IVC:  Appears normal.  Pancreas:  Imaged portion unremarkable.  Entire pancreas not visualize because of obscuring bowel gas.  Spleen:  Within normal limits in size and echotexture.  Right kidney:  11.3 cm length.  Minimal pelvic fullness noted without hydronephrosis.  Small echogenic shadowing calculus in the lower pole measures 7 mm.  Scattered small renal cysts noted, largest inferiorly measuring 2.3 cm.  Left kidney:  Normal in size and parenchymal echogenicity.  No evidence of mass or hydronephrosis.  Abdominal Aorta:  No aneurysm identified.  Small right pleural effusion evident.  IMPRESSION: Cholelithiasis with associated gallbladder wall thickening and surrounding pericholecystic fluid.  Despite these findings no Murphy's sign elicited.  Findings remain indeterminate for cholecystitis versus chronic liver disease, hypoproteinemia, or CHF.  No biliary dilatation  Nonobstructing right nephrolithiasis  Scattered small right renal cysts  Mild right renal pelviectasis  Small right effusion  *RADIOLOGY REPORT*  Clinical Data: Hepatic failure  DUPLEX  ULTRASOUND OF LIVER  Technique:  Color and duplex Doppler ultrasound was performed to evaluate the hepatic in-flow and out-flow vessels.  Comparison:  12/15/2009  Portal Vein Velocities: Main:      46 cm/sec Right:      42 cm/sec Left:        12 cm/sec  Hepatic Vein Velocities: Right:     23 cm/sec Middle:  40 cm/sec Left:       44 were cm/sec  Hepatic Artery Velocity:   59 cm/sec Splenic Vein Velocity:      15 cm/sec  Varices:   Not visualized Ascites:   Not present  Findings: Patent portal, hepatic, and splenic veins.  Normal directional flow.  Negative for portal or splenic vein occlusion.  Left portal vein hypoechoic eccentric wall irregularity without occlusion.  Difficult to exclude a subtle amount of nonocclusive left portal vein thrombus.  Again, the main portal vein, right and left  portal veins all demonstrate normal directional flow without evidence of significant portal hypertension.  IMPRESSION: Patent portal, hepatic and splenic veins with normal directional flow.  Subtle left portal vein eccentric wall irregularity versus small degree of nonocclusive thrombosis not entirely excluded.  Negative for reversed portal flow or significant portal hypertension by liver Doppler.  Original Report Authenticated By: Judie Petit. Ruel Favors, M.D.   [name] Art/ven Flow Abd Pelv Doppler  04/20/2012  *RADIOLOGY REPORT*  Clinical Data:  Acute liver failure  ABDOMINAL ULTRASOUND COMPLETE  Comparison:  12/15/2009 CT abdomen  Findings:  Gallbladder:  Small echogenic shadowing gallstones noted within the gallbladder.  Gallbladder wall is thickened measuring 9.2 mm. Additionally, there is pericholecystic fluid.  Despite these findings, no elicited Murphy's sign demonstrated.  Findings remain nonspecific.  Cholecystitis not excluded.  Other considerations include chronic liver disease, hypoproteinemia, or CHF.  Common Bile Duct:  Within normal limits in caliber.  Liver: No focal mass lesion identified.  Within normal limits in  parenchymal echogenicity.  IVC:  Appears normal.  Pancreas:  Imaged portion unremarkable.  Entire pancreas not visualize because of obscuring bowel gas.  Spleen:  Within normal limits in size and echotexture.  Right kidney:  11.3 cm length.  Minimal pelvic fullness noted without hydronephrosis.  Small echogenic shadowing calculus in the lower pole measures 7 mm.  Scattered small renal cysts noted, largest inferiorly measuring 2.3 cm.  Left kidney:  Normal in size and parenchymal echogenicity.  No evidence of mass or hydronephrosis.  Abdominal Aorta:  No aneurysm identified.  Small right pleural effusion evident.  IMPRESSION: Cholelithiasis with associated gallbladder wall thickening and surrounding pericholecystic fluid.  Despite these findings no Murphy's sign elicited.  Findings remain indeterminate for cholecystitis versus chronic liver disease, hypoproteinemia, or CHF.  No biliary dilatation  Nonobstructing right nephrolithiasis  Scattered small right renal cysts  Mild right renal pelviectasis  Small right effusion  *RADIOLOGY REPORT*  Clinical Data: Hepatic failure  DUPLEX ULTRASOUND OF LIVER  Technique:  Color and duplex Doppler ultrasound was performed to evaluate the hepatic in-flow and out-flow vessels.  Comparison:  12/15/2009  Portal Vein Velocities: Main:      46 cm/sec Right:      42 cm/sec Left:        12 cm/sec  Hepatic Vein Velocities: Right:     23 cm/sec Middle:  40 cm/sec Left:       44 were cm/sec  Hepatic Artery Velocity:   59 cm/sec Splenic Vein Velocity:      15 cm/sec  Varices:   Not visualized Ascites:   Not present  Findings: Patent portal, hepatic, and splenic veins.  Normal directional flow.  Negative for portal or splenic vein occlusion.  Left portal vein hypoechoic eccentric wall irregularity without occlusion.  Difficult to exclude a subtle amount of nonocclusive left portal vein thrombus.  Again, the main portal vein, right and left portal veins all demonstrate normal directional flow  without evidence of significant portal hypertension.  IMPRESSION: Patent portal, hepatic and splenic veins with normal directional flow.  Subtle left portal vein eccentric wall irregularity versus small degree of nonocclusive thrombosis not entirely excluded.  Negative for reversed portal flow or significant portal hypertension by liver Doppler.  Original Report Authenticated By: Judie Petit. Ruel Favors, M.D.   Portable Chest Xray In Am  04/20/2012  *RADIOLOGY REPORT*  Clinical Data: Respiratory failure; assess endotracheal tube placement.  PORTABLE CHEST - 1 VIEW  Comparison: Chest radiograph performed 04/19/2012  Findings: The patient's endotracheal  tube is seen ending 3 cm above the carina.  An enteric tube is noted extending below the diaphragm.  A right IJ line is noted ending about the distal SVC.  The lungs remain mildly hypoexpanded.  Vascular congestion is again seen, with bilateral perihilar and left basilar airspace opacities. Findings are most compatible with pulmonary edema, though pneumonia could have a similar appearance.  A small left pleural effusion is likely present.  No pneumothorax is seen.  The cardiomediastinal silhouette is mildly enlarged.  No acute osseous abnormalities are identified.  IMPRESSION:  1.  Endotracheal tube seen ending 3 cm above the carina. 2.  Lungs mildly hypoexpanded; bilateral airspace opacities are most compatible with pulmonary edema, perhaps mildly worsened, though pneumonia could have a similar appearance. 3.  Mild cardiomegaly; likely small left pleural effusion.  Original Report Authenticated By: Tonia Ghent, M.D.   Dg Chest Port 1 View  04/19/2012  *RADIOLOGY REPORT*  Clinical Data: Central line and endotracheal tube placement.  PORTABLE CHEST - 1 VIEW  Comparison: 04/19/2012, 16 at 2 hours.  Findings: Endotracheal tube and nasogastric tube are present.  The endotracheal tube tip is 22 mm from the carina. Consolidation of the left base is present.  Airspace disease  at the right lung base is also present.  Cardiomegaly.  Pulmonary aeration appears similar to the exam earlier today.  IMPRESSION: 1.  Unchanged support apparatus, in good position. 2.  Left basilar consolidation and small focus of new right basilar airspace disease.  Differential considerations include pneumonia, aspiration and asymmetric/atypical pulmonary edema.  Original Report Authenticated By: Andreas Newport, M.D.   Dg Chest Port 1 View  04/19/2012  *RADIOLOGY REPORT*  Clinical Data: Intubation.  PORTABLE CHEST - 1 VIEW 04/19/2012 1602 hours:  Comparison: Portable chest x-ray earlier same date 1453 hours.  Findings: Endotracheal tube tip in satisfactory position approximately 3 cm above the carina.  Nasogastric tube courses below the diaphragm into the stomach.  Cardiac silhouette enlarged but stable.  Pulmonary venous hypertension without overt edema. Dense consolidation in the left lower lobe, unchanged.  Mild atelectasis at the right base, unchanged.  No new pulmonary parenchymal abnormalities.  IMPRESSION:  1.  Endotracheal tube tip in satisfactory position approximately 3 cm above the carina. 2.  Nasogastric tube courses below the diaphragm into the stomach. 3.  Stable dense left lower lobe atelectasis and/or pneumonia and mild right basilar atelectasis.  Stable cardiomegaly and pulmonary venous hypertension without overt edema.  Original Report Authenticated By: Arnell Sieving, M.D.   Dg Chest Port 1 View  04/19/2012  *RADIOLOGY REPORT*  Clinical Data: Femoral central line placement.  Sepsis.  PORTABLE CHEST - 1 VIEW  Comparison: 01/13/2012.  Findings: Stable enlargement of the cardiac silhouette.  Increased opacity at the left lung base.  Slight increase in prominence of the right hilum with a somewhat rounded configuration.  No adenopathy in that region on the CTA dated 06/29/2011.  Linear density in the left mid lung zone.  Unremarkable bones.  IMPRESSION:  1.  Increased left basilar opacity  compatible with dense pneumonia or atelectasis. 2.  Linear atelectasis in the left mid lung zone. 3.  Possible right hilar adenopathy. 4.  Stable cardiomegaly.  Original Report Authenticated By: Darrol Angel, M.D.   Dg Chest Port 1v Same Day  04/19/2012  *RADIOLOGY REPORT*  Clinical Data: Central venous catheter placement  PORTABLE CHEST - 1 VIEW SAME DAY  Comparison: 1600 hours  Findings: Right internal jugular vein central venous catheter  has been placed with its tip at the cavoatrial junction.  No pneumothorax.  Endotracheal and NG tubes are stable.  Bilateral central basilar airspace disease is stable.  IMPRESSION: Right internal jugular vein central venous catheter placed with its tip at the cavoatrial junction and no pneumothorax.  Otherwise stable.  Original Report Authenticated By: Donavan Burnet, M.D.    ECG:  Sinus rhythm RBBB likely old inferior and anterolateral infarcts, ST depression in lateral leads, no significant change from prior  Impression: 89F with hx of severe obstructive CAD s/p POBA in 08/2011 on ASA/Plavix presents in septic shock, Acute respiratory failure, HCAP, UTI, Severe anemia, along with acute renal and liver failure.  Initial trop was negative x 2.  Third trop was 1.84 and last collected is 3.79.  We were consulted for assistance with management.  No history was obtainable to assess prior angina related symptoms prior to intubated, however, given clinical scenario and review of medical records, suspect that her troponin elevation is related to Type II MI in setting of severe critical illness in patient with obstructive 3v CAD.  No urgent invasive ischemic evaluation  needed at this time.  Would recommend continued aggressive supportive care given multi-organ failure.  Recommendations: 1.  Medical management: Continue on ASA/Plavix as long as no active bleeding. 2.  Transfuse as indicated to prevent further demand ischemia. 3.  TTE in am assess LV function, wall  motion. 4.  Monitor on telemetry, daily ecg in am. 5.  Trend cardiac biomarkers to peak  Thank you for this consult and allowing [name] to participate in the care of your patient.  We will follow along with you.

## 2012-04-20 NOTE — Progress Notes (Signed)
Dr Vassie Loll notified of AF w/ RVR 130-150.BP90/49 and pt alert-no c/o pain.  Lopressor 2.5 given at 1640. Returned to NSR at 1655.HR 94 NSR, BP 105/53

## 2012-04-20 NOTE — Progress Notes (Signed)
Name: Holly Kim MRN: [id number] DOB: 11-01-1940    LOS: 1  Referring Provider:  ED  Reason for Referral:  Sepsis / AMS  PULMONARY / CRITICAL CARE MEDICINE  HPI:  71 y/o F, SNF resident,  with PMH of PE/DVT (not on anticoagulation), DM, CAD, Obesity, HTN, COPD presented to [facility] ED 7/7 with worsening mental status.  ED evaluation demonstrated acute renal failure, hyperkalemia, leukocytosis, lactic acid 1.9 / PCT 0.97.  Further decompensated requiring intubation in ED.  Recent Rx for UTI.  PCCM consulted for ICU admit.    Events Since Admission: 7/7 to ED with presumed UTI , concern for sepsis, anemia --decompensated requiring intubation. 7/8 remains intubated   Current Status: intubated on vent  Vital Signs: Temp:  [96.5 F (35.8 C)-99.1 F (37.3 C)] 98.4 F (36.9 C) (07/08 0800) Pulse Rate:  [37-226] 98  (07/08 0800) Resp:  [14-21] 19  (07/08 0753) BP: (70-134)/(49-94) 125/49 mmHg (07/08 0800) SpO2:  [97 %-100 %] 100 % (07/08 0753) FiO2 (%):  [30 %-50 %] 30 % (07/08 0753) Weight:  [122.2 kg (269 lb 6.4 oz)-126 kg (277 lb 12.5 oz)] 126 kg (277 lb 12.5 oz) (07/08 0215)  Physical Examination: Gen: chronically ill appearing, sedated on vent HEENT: NCAT, ETT in place PULM: Rhonchi bilat in bases CV: Tachy, s1/s2 noted AB: BS in frequent, nontender (sedated) Ext: warm, edema noted Neuro: sedated on vent  Principal Problem:  *Septic shock Active Problems:  Acute respiratory failure  HCAP (healthcare-associated pneumonia)  UTI (urinary tract infection)  Fulminant liver failure   ASSESSMENT AND PLAN  PULMONARY  Lab 04/20/12 0510 04/20/12 0355 04/19/12 2328 04/19/12 1720  PHART -- -- -- 7.288*  PCO2ART -- -- -- 50.1*  PO2ART -- -- -- 213.0*  HCO3 -- -- -- 23.6  O2SAT 73.5 54.0 68.0 99.3   Ventilator Settings: Vent Mode:  [-] PRVC FiO2 (%):  [30 %-50 %] 30 % Set Rate:  [14 bmp-18 bmp] 18 bmp Vt Set:  [440 mL-550 mL] 440 mL PEEP:  [5 cmH20] 5 cmH20 Plateau  Pressure:  [24 cmH20-26 cmH20] 25 cmH20   CXR:  7/7 Endotracheal tube tip in satisfactory position approximately 3 cm above the carina.  Nasogastric tube courses below the diaphragm into the stomach. Stable dense left lower lobe atelectasis and/or pneumonia and mild right basilar atelectasis. Stable cardiomegaly and pulmonary venous hypertension without overt edema.  ETT:  7/7>>>  A:   Acute Respiratory Failure -in setting of presumed urosepsis / ARF; HCAP vs. pulm edema  P:   -full vent support -diurese today -f/u cxr / abg -abx as below -sputum culture  CARDIOVASCULAR  Lab 04/20/12 0330 04/19/12 1620 04/19/12 1451  TROPONINI 1.84* <0.30 <0.30  LATICACIDVEN 1.2 -- 1.9  PROBNP -- 21491.0* --   ECG:   Lines:   7/7 Fem TLC>>>7/7 7/7 R IJ CVL >>> 2011 TTE>> LVH, Diastolic dysfunction, EF 55-60%,   A:  Hypotension, septic shock resolved Hx of HLD Hx of HTN -on ACE, bisoprolol,  Hx of CAD/CHF plavix, ASA and demadex at SNF Elevated Troponin, demand ischaemia  P:  -EGDT / levophed to support MAP >65 -volume resuscitation  -hold anti-hypertensive's  -CVP's elevated to diurese -stop dobutamine -EKG today    RENAL  Lab 04/20/12 0330 04/19/12 1451  NA 133* 128*  K 4.6 5.1  CL 100 89*  CO2 21 29  BUN 70* 78*  CREATININE 1.69* 2.08*  CALCIUM 7.6* 8.8  MG 2.3 --  PHOS 4.7* --  Intake/Output      07/07 0701 - 07/08 0700 07/08 0701 - 07/09 0700   I.V. (mL/kg) 8815.7 (70) 132.2 (1)   Blood 705    IV Piggyback 200    Total Intake(mL/kg) 9720.7 (77.1) 132.2 (1)   Urine (mL/kg/hr) 340 (0.1) 155   Total Output 340 155   Net +9380.7 -22.8         Foley:  7/7>>>  A:   Acute Renal Failure improving Hyponatremia  P:   -volume resuscitation -f/u BMP   GASTROINTESTINAL  Lab 04/20/12 0330 04/19/12 1451  AST 2905* 3556*  ALT 2035* 1907*  ALKPHOS 143* 91  BILITOT 0.6 0.3  PROT 5.9* 6.0  ALBUMIN 1.8* 1.8*    A:   Elevated LFT's - likely related to  hypoperfusion with low BP vs. Statin therapy  P:   -monitor, repeat in am -hold statin -check RUQ ultrasound with vascular  HEMATOLOGIC  Lab 04/20/12 0330 04/19/12 1451  HGB 9.0* 6.3*  HCT 28.4* 20.7*  PLT 291 321  INR 1.64* 1.63*  APTT 36 42*   A:   Anemia   P:  -transfuse 2 unit PRBC's -monitor cbc  INFECTIOUS  Lab 04/20/12 0330 04/19/12 1620 04/19/12 1451 04/19/12 1450  WBC 13.8* -- 15.6* --  PROCALCITON -- 0.88 -- 0.97   Cultures: 7/7 UA>>> 7/7 UC>>> 7/7 BCx2>>> 7/7 Sputum>>>  Antibiotics: Vanco 7/7 (sepsis)>>> Meropenem (sepsis) 7/7>>>  A:   Presumed Urosepsis (7/7) LLL HCAP vs ATX  P:   -abx as above -f/u pan culture   ENDOCRINE  Lab 04/20/12 0741 04/20/12 0437 04/19/12 2343 04/19/12 2002 04/19/12 1854  GLUCAP 149* 138* 162* 155* 177*   A:   Hyperglycemia -in setting of stress. No acute hx of DM.     P:   -SSI  NEUROLOGIC  A:   AMS in setting of sepsis, ARF, improving  P:   -correct underlying source, hopeful for mental status to resolve.    BEST PRACTICE / DISPOSITION Level of Care:  ICU Primary Service:  PCCM  Consultants:   Code Status:  FULL Diet:  NPO DVT Px:  Heparin  GI Px:  protonix Skin Integrity:   Social / Family:  Attempted to call son this morning, could not reach, left message  CC time 40 minutes  Yolonda Kida PCCM Pager: [phone] Cell: [phone] If no response, call [phone]

## 2012-04-20 NOTE — Progress Notes (Signed)
INITIAL ADULT NUTRITION ASSESSMENT Date: 04/20/2012   Time: 4:23 PM Reason for Assessment: TF consult for initiation and management, Low Braden, vent  ASSESSMENT: Female 71 y.o.  Dx: Septic shock, healthcare associated pneumonia, UTI, fulminant liver failure.  Hx:  Past Medical History  Diagnosis Date  . PE (pulmonary embolism)   . DVT (deep venous thrombosis)   . Diabetes mellitus   . Asthma   . Hypertension   . CAD (coronary artery disease)     RCA ostial DES 9/11, Circumflex ostial PTCA only 09/11/11.  Marland Kitchen GERD (gastroesophageal reflux disease)   . COPD (chronic obstructive pulmonary disease)   . Obesity   . Tardive dyskinesia   . History of renal failure   . Depression   . Borderline personality disorder   . Anxiety   . Anemia     hx of blood transfusion x 3  . Arthritis   . Peripheral neuropathy   . Shortness of breath     at rest   Past Surgical History  Procedure Date  . US echocardiography 2008  . Orif ankle fracture   . Appendectomy   . Cervical discectomy   . Ptca 06/2010  . Tonsillectomy   . Transthoracic echocardiogram 06/2010    Grade 1 diastolic, EF 55-60  . Esophagogastroduodenoscopy 01/21/2012    Procedure: ESOPHAGOGASTRODUODENOSCOPY (EGD);  Surgeon: Theda Belfast, MD;  Location: [facility] ENDOSCOPY;  Service: Endoscopy;  Laterality: N/A;    Related Meds:     . antiseptic oral rinse  15 mL Mouth Rinse QID  . chlorhexidine  15 mL Mouth Rinse BID  . furosemide  60 mg Intravenous Once  . heparin  5,000 Units Subcutaneous Q8H  . insulin aspart  0-9 Units Subcutaneous Q4H  . lidocaine (cardiac) 100 mg/64ml      . meropenem (MERREM) IV  1 g Intravenous Q12H  . meropenem (MERREM) IV  500 mg Intravenous Once  . midazolam      . midazolam      . midazolam      . pantoprazole sodium  40 mg Per Tube Q1200  . phytonadione  10 mg Subcutaneous Once  . rocuronium      . sodium chloride  1,000 mL Intravenous Once  . sodium chloride  1,000 mL Intravenous Once  .  sodium chloride  1,000 mL Intravenous Once  . sodium chloride  2,000 mL Intravenous Once  . sodium chloride  25 mL/kg Intravenous Once  . sodium chloride  500 mL Intravenous Once  . vancomycin  1,500 mg Intravenous STAT  . vancomycin  1,750 mg Intravenous Q24H  . vancomycin  1,000 mg Intravenous Once  . DISCONTD: meropenem (MERREM) IV  1 g Intravenous Q8H  . DISCONTD: meropenem (MERREM) IV  500 mg Intravenous Q8H  . DISCONTD: vancomycin  1,500 mg Intravenous Q24H  . DISCONTD: vancomycin  1,750 mg Intravenous Q24H  . DISCONTD: vancomycin  1,000 mg Intravenous Once    Ht: 5\' 4"  (162.6 cm)  Wt: 277 lb 12.5 oz (126 kg)  Ideal Wt: 54.7 kg  % Ideal Wt: 230  Usual Wt:  Wt Readings from Last 10 Encounters:  04/20/12 277 lb 12.5 oz (126 kg)  01/21/12 273 lb 4.8 oz (123.968 kg)  01/21/12 273 lb 4.8 oz (123.968 kg)  01/21/12 273 lb 4.8 oz (123.968 kg)  11/15/11 269 lb (122.018 kg)  10/27/11 260 lb 4.8 oz (118.071 kg)  09/24/11 279 lb 15.8 oz (127 kg)  09/12/11 276 lb (125.193 kg)  09/12/11 276 lb (125.193 kg)  08/29/11 290 lb 3.2 oz (131.634 kg)    % Usual Wt: 100  Body mass index is 47.68 kg/(m^2).  Food/Nutrition Related Hx: Pt from SNF.  Labs:  CMP     Component Value Date/Time   NA 133* 04/20/2012 0330   K 4.6 04/20/2012 0330   CL 100 04/20/2012 0330   CO2 21 04/20/2012 0330   GLUCOSE 165* 04/20/2012 0330   BUN 70* 04/20/2012 0330   CREATININE 1.69* 04/20/2012 0330   CREATININE 1.16* 08/29/2011 1458   CALCIUM 7.6* 04/20/2012 0330   PROT 5.9* 04/20/2012 0330   ALBUMIN 1.8* 04/20/2012 0330   AST 2905* 04/20/2012 0330   ALT 2035* 04/20/2012 0330   ALKPHOS 143* 04/20/2012 0330   BILITOT 0.6 04/20/2012 0330   GFRNONAA 29* 04/20/2012 0330   GFRAA 34* 04/20/2012 0330    I/O last 3 completed shifts: In: 9720.7 [I.V.:8815.7; Blood:705; IV Piggyback:200] Out: 340 [Urine:340] Total I/O In: 1341 [I.V.:741; IV Piggyback:600] Out: 1175 [Urine:1175]   Diet Order: NPO  Supplements/Tube  Feeding:none  IVF:    sodium chloride Last Rate: 100 mL/hr at 04/20/12 7829  DISCONTD: norepinephrine (LEVOPHED) Adult infusion Last Rate: 5 mcg/min (04/20/12 0100)    Estimated Nutritional Needs:   Kcal: 1969 Protein: 110-120 gm Fluid: >2L Intubated and unable to wean today.  BM today. NUTRITION DIAGNOSIS: -Inadequate oral intake (NI-2.1).  Status: Ongoing  RELATED TO: inability to eat  AS EVIDENCE BY: npo status  MONITORING/EVALUATION(Goals): Enteral nutrition to provide 60-70% of estimated calorie needs (22-25 kcals/kg ideal body weight) and 100% of estimated protein needs, based on ASPEN guidelines for permissive underfeeding in critically ill obese individuals.  Monitor:  TF tolerance and adequacy.  Labs, i/o  EDUCATION NEEDS: -No education needs identified at this time  INTERVENTION: Will begin TF with Promote at 20 ml per hour.  Increase 10 ml every 4 hours to goal of 40 ml/hr.  Prostat 30 ml tid.  Promote at 40 ml/hr + tid prostat to provide:  1260 kcal (64% of estimated needs) and 105 gm protein (95% estimated protein needs) daily per OG tube.  Dietitian [phone]  DOCUMENTATION CODES Per approved criteria  -Morbid Obesity    Derrell Lolling Anastasia Fiedler 04/20/2012, 4:23 PM

## 2012-04-21 ENCOUNTER — Inpatient Hospital Stay (HOSPITAL_COMMUNITY): Payer: Medicare Other

## 2012-04-21 DIAGNOSIS — I959 Hypotension, unspecified: Secondary | ICD-10-CM | POA: Diagnosis present

## 2012-04-21 DIAGNOSIS — I214 Non-ST elevation (NSTEMI) myocardial infarction: Secondary | ICD-10-CM | POA: Diagnosis present

## 2012-04-21 DIAGNOSIS — D649 Anemia, unspecified: Secondary | ICD-10-CM

## 2012-04-21 LAB — CBC WITH DIFFERENTIAL/PLATELET
Basophils Relative: 0 % (ref 0–1)
Hemoglobin: 9.3 g/dL — ABNORMAL LOW (ref 12.0–15.0)
Lymphocytes Relative: 8 % — ABNORMAL LOW (ref 12–46)
MCHC: 32.6 g/dL (ref 30.0–36.0)
Monocytes Relative: 4 % (ref 3–12)
Neutro Abs: 13.1 10*3/uL — ABNORMAL HIGH (ref 1.7–7.7)
Neutrophils Relative %: 87 % — ABNORMAL HIGH (ref 43–77)
RBC: 3.59 MIL/uL — ABNORMAL LOW (ref 3.87–5.11)
WBC: 15.1 10*3/uL — ABNORMAL HIGH (ref 4.0–10.5)

## 2012-04-21 LAB — COMPREHENSIVE METABOLIC PANEL
BUN: 55 mg/dL — ABNORMAL HIGH (ref 6–23)
CO2: 23 mEq/L (ref 19–32)
Calcium: 7.9 mg/dL — ABNORMAL LOW (ref 8.4–10.5)
Creatinine, Ser: 1.21 mg/dL — ABNORMAL HIGH (ref 0.50–1.10)
GFR calc Af Amer: 50 mL/min — ABNORMAL LOW (ref >90)
GFR calc non Af Amer: 43 mL/min — ABNORMAL LOW (ref >90)
Glucose, Bld: 150 mg/dL — ABNORMAL HIGH (ref 70–99)

## 2012-04-21 LAB — URINE CULTURE
Colony Count: NO GROWTH
Colony Count: NO GROWTH

## 2012-04-21 LAB — BLOOD GAS, ARTERIAL
Bicarbonate: 21.7 mEq/L (ref 20.0–24.0)
MECHVT: 0.44 mL
O2 Saturation: 99.2 %
Patient temperature: 98.6
TCO2: 20.4 mmol/L (ref 0–100)

## 2012-04-21 LAB — GLUCOSE, CAPILLARY
Glucose-Capillary: 158 mg/dL — ABNORMAL HIGH (ref 70–99)
Glucose-Capillary: 176 mg/dL — ABNORMAL HIGH (ref 70–99)
Glucose-Capillary: 184 mg/dL — ABNORMAL HIGH (ref 70–99)

## 2012-04-21 MED ORDER — FENTANYL CITRATE 0.05 MG/ML IJ SOLN
12.5000 ug | INTRAMUSCULAR | Status: DC | PRN
  Administered 2012-04-21 – 2012-04-24 (×18): 25 ug via INTRAVENOUS
  Filled 2012-04-21 (×17): qty 2

## 2012-04-21 MED ORDER — OXYCODONE HCL 5 MG PO TABS
10.0000 mg | ORAL_TABLET | Freq: Four times a day (QID) | ORAL | Status: DC | PRN
  Administered 2012-04-21 – 2012-04-23 (×5): 10 mg via ORAL
  Filled 2012-04-21 (×4): qty 2
  Filled 2012-04-21: qty 1

## 2012-04-21 MED ORDER — CLONAZEPAM 0.5 MG PO TABS
0.2500 mg | ORAL_TABLET | Freq: Two times a day (BID) | ORAL | Status: DC
  Administered 2012-04-21 – 2012-04-22 (×2): 0.25 mg via ORAL
  Filled 2012-04-21 (×2): qty 1

## 2012-04-21 NOTE — Progress Notes (Signed)
Name: Holly Kim MRN: [id number] DOB: April 07, 1941    LOS: 2  Referring Provider:  ED  Reason for Referral:  Sepsis / AMS  PULMONARY / CRITICAL CARE MEDICINE  PROFILE:  71 y/o F, SNF resident,  with PMH of PE/DVT (not on anticoagulation), DM, CAD, Obesity, HTN, COPD presented to [facility] ED 7/7 with AMS, acute renal failure, hyperkalemia, leukocytosis, lactic acid 1.9 / PCT 0.97.  Further decompensation requiring intubation in ED.  Recent Rx for UTI.  PCCM consulted for ICU admit.    Events Since Admission: 7/7 to ED with presumed UTI , concern for sepsis, anemia --decompensated requiring intubation. 7/8 + cardiac markers, Cards consult, conservative Rx 7/9 RASS 0, + F/C, passed SBT, extubated  Current Status: RASS 0, + F/C, passed SBT, extubated and looks good initially post extubation  Vital Signs: Temp:  [98.1 F (36.7 C)-99.1 F (37.3 C)] 98.8 F (37.1 C) (07/09 1200) Pulse Rate:  [57-181] 88  (07/09 1200) Resp:  [14-25] 21  (07/09 1200) BP: (84-153)/(35-103) 128/62 mmHg (07/09 1200) SpO2:  [88 %-100 %] 100 % (07/09 1200) FiO2 (%):  [30 %] 30 % (07/09 0755) Weight:  [126.5 kg (278 lb 14.1 oz)] 126.5 kg (278 lb 14.1 oz) (07/09 0000)  Physical Examination: Gen: chronically ill appearing, NAD, cognition intact HEENT: WNL PULM: clear anteriorly CV: RRR s M ABD: nabs, soft, NT Ext: warm, edema noted Neuro:no focal deficits  Principal Problem:  *Hypotension Active Problems:  Acute respiratory failure  Recent UTI (urinary tract infection)  NSTEMI (non-ST elevated myocardial infarction)  Elevated LFTs   ASSESSMENT AND PLAN  PULMONARY  Lab 04/21/12 0514 04/20/12 1010 04/20/12 0510 04/20/12 0355 04/19/12 2328 04/19/12 1720  PHART 7.389 7.344* -- -- -- 7.288*  PCO2ART 36.7 40.1 -- -- -- 50.1*  PO2ART 131.0* 110.0* -- -- -- 213.0*  HCO3 21.7 21.3 -- -- -- 23.6  O2SAT 99.2 97.2 73.5 54.0 68.0 --   Ventilator Settings: Vent Mode:  [-] PSV;CPAP FiO2 (%):  [30 %]  30 % Set Rate:  [18 bmp] 18 bmp Vt Set:  [440 mL] 440 mL PEEP:  [5 cmH20] 5 cmH20 Pressure Support:  [5 cmH20] 5 cmH20 Plateau Pressure:  [20 cmH20-27 cmH20] 26 cmH20   CXR:  NSC  ETT:  7/7>>> 7/9  A:   Acute Respiratory Failure -intubated for AMS, hypotension, severe anemia, elevated cardiac markers, presumed pulm edema  P:   - Extubate today - Keep I/Os negative as permitted by BP and renal function   CARDIOVASCULAR  Lab 04/20/12 1915 04/20/12 1130 04/20/12 0330 04/19/12 1620 04/19/12 1451  TROPONINI 3.79* 3.62* 1.84* <0.30 <0.30  LATICACIDVEN -- -- 1.2 -- 1.9  PROBNP -- -- -- 21491.0* --   ECG:   Lines:   7/7 Fem TLC>>>7/7 7/7 R IJ CVL >>> 7/8 Echo >>   A:  Hypotension, resolved Hx of HLD - holding statin due to elevated LFTs Hx of HTN -on ACE, bisoprolol Hx of CAD/CHF plavix, ASA and demadex at SNF Elevated Troponin, demand ischemia  P:  - Cont ASA, beta blocker - Holding Plavix due to anemia and > 6 mos since stent placement    RENAL  Lab 04/21/12 0445 04/20/12 0330 04/19/12 1451  NA 140 133* 128*  K 4.0 4.6 --  CL 108 100 89*  CO2 23 21 29   BUN 55* 70* 78*  CREATININE 1.21* 1.69* 2.08*  CALCIUM 7.9* 7.6* 8.8  MG -- 2.3 --  PHOS -- 4.7* --  Intake/Output      07/08 0701 - 07/09 0700 07/09 0701 - 07/10 0700   I.V. (mL/kg) 2585.7 (20.4) 160 (1.3)   Blood     NG/GT 280 180   IV Piggyback 600    Total Intake(mL/kg) 3465.7 (27.4) 340 (2.7)   Urine (mL/kg/hr) 2740 (0.9) 275 (0.3)   Total Output 2740 275   Net +725.7 +65         Foley:  7/7>>>  A:   Acute Renal Failure resolved Hyponatremia resolved  P:   - Limit IVFs and monitor   GASTROINTESTINAL  Lab 04/21/12 0445 04/20/12 0330 04/19/12 1451  AST 1015* 2905* 3556*  ALT 1389* 2035* 1907*  ALKPHOS 161* 143* 91  BILITOT 0.4 0.6 0.3  PROT 6.0 5.9* 6.0  ALBUMIN 1.8* 1.8* 1.8*    A:   Elevated LFT's - shock/congestion + statins  P:   -monitor  intermittently   HEMATOLOGIC  Lab 04/21/12 0445 04/20/12 0330 04/19/12 1451  HGB 9.3* 9.0* 6.3*  HCT 28.5* 28.4* 20.7*  PLT 248 291 321  INR -- 1.64* 1.63*  APTT -- 36 42*   A:   Anemia   P:  -transfused 2 unit PRBC's -monitor cbc daily for now.   INFECTIOUS  Lab 04/21/12 0445 04/20/12 0330 04/19/12 1620 04/19/12 1451 04/19/12 1450  WBC 15.1* 13.8* -- 15.6* --  PROCALCITON -- -- 0.88 -- 0.97   Cultures: 7/7 UC>>> NEG 7/7 BCx2>>> ngtd 7/9 >>  7/7 Sputum>>> ngtd 7/9 >>  Antibiotics: Vanco 7/7 (sepsis)>>> 7/9 Meropenem (sepsis) 7/7>>> 7/9  A:   Doubt sepsis given persistently low PCT and lack of identifiable focus  P:   - D/C abx - Follow up abx   ENDOCRINE  Lab 04/21/12 1157 04/21/12 0800 04/21/12 0341 04/20/12 2357 04/20/12 1936  GLUCAP 184* 176* 132* 158* 158*   A:   Hyperglycemia -in setting of stress. No hx of DM.     P:   - cont SSI  NEUROLOGIC  A:   AMS in setting of acute illness, ARF - resolved  P:   - monitor   BEST PRACTICE / DISPOSITION Level of Care:  ICU Primary Service:  PCCM  Consultants:  Cards 7/8 Code Status:  FULL Diet:  NPO post extubation DVT Px:  Heparin  GI Px:  protonix   CC time 40 minutes   Billy Fischer, MD ; [facility] service Mobile [phone].  After 5:30 PM or weekends, call [phone]

## 2012-04-21 NOTE — Progress Notes (Signed)
Clinical Social Work Department BRIEF PSYCHOSOCIAL ASSESSMENT 04/21/2012  Patient:  Holly Kim, Holly Kim     Account Number:  [id number]     Admit date:  04/19/2012  Clinical Social Worker:  Orpah Greek  Date/Time:  04/21/2012 12:18 PM  Referred by:  Physician  Date Referred:  04/21/2012 Referred for  Other - See comment   Other Referral:   Patient was admitted from [facility]   Interview type:  Patient Other interview type:   patient & son    PSYCHOSOCIAL DATA Living Status:  FACILITY Admitted from facility:  [facility] Level of care:  Skilled Nursing Facility Primary support name:  Holly Kim (son) c#: [phone] Primary support relationship to patient:  CHILD, ADULT Degree of support available:   good    CURRENT CONCERNS Current Concerns  Post-Acute Placement   Other Concerns:    SOCIAL WORK ASSESSMENT / PLAN CSW spoke with patient & son, Holly Kim at bedside re: discharge planning.   Assessment/plan status:  Information/Referral to [facility] Other assessment/ plan:   Information/referral to community resources:   Patient states she was admitted from [facility] where she plans to return at discharge. CSW confirmed with Joni Reining @ [facility] that they will have a bed available for patient at discharge. CSW completed FL2 and faxed information to SNF.    PATIENT'S/FAMILY'S RESPONSE TO PLAN OF CARE: Patient & son are agreeable with returning to [place] at discharge. Patient is currently intubated, but CSW will continue to follow and check back closer to discharge.        Unice Bailey, LCSW [facility] Clinical Social Worker cell #: [phone]  (covering for CSW, Doreen Salvage)

## 2012-04-21 NOTE — Procedures (Signed)
Extubation Procedure Note  Patient Details:   Name: Holly Kim DOB: [birth year] MRN: [id number]   Airway Documentation:     Evaluation  O2 sats: stable throughout Complications: No apparent complications Patient did tolerate procedure well. Bilateral Breath Sounds: Rhonchi Suctioning: Airway Yes  Leonides Schanz 04/21/2012, 10:26 AM

## 2012-04-21 NOTE — Progress Notes (Signed)
eLink Physician-Brief Progress Note Patient Name: Holly Kim DOB: 06-25-1941 MRN: [id number]  Date of Service  04/21/2012   HPI/Events of Note     eICU Interventions  Resumed Oxy-IR & clonazepam - home meds Rounding MD to do detailed med rec   Intervention Category Intermediate Interventions: Medication change / dose adjustment  Aavya Shafer V. 04/21/2012, 9:55 PM

## 2012-04-21 NOTE — Progress Notes (Signed)
    Subjective:  Intubated, denies chest pain. Other history unobtainable.  Objective:  Vital Signs in the last 24 hours: Temp:  [98.1 F (36.7 C)-99.1 F (37.3 C)] 98.1 F (36.7 C) (07/09 0730) Pulse Rate:  [48-181] 81  (07/09 0730) Resp:  [14-25] 18  (07/09 0730) BP: (84-143)/(35-103) 131/72 mmHg (07/09 0715) SpO2:  [88 %-100 %] 100 % (07/09 0730) FiO2 (%):  [25 %-30 %] 30 % (07/09 0730) Weight:  [126.5 kg (278 lb 14.1 oz)] 126.5 kg (278 lb 14.1 oz) (07/09 0000)  Intake/Output from previous day: 07/08 0701 - 07/09 0700 In: 3465.7 [I.V.:2585.7; NG/GT:280; IV Piggyback:600] Out: 2740 [Urine:2740]  Physical Exam: Pt is alert and awake, obese woman, intubated HEENT: normal Neck: JVP - normal Lungs: coarse bilaterally CV: RRR without murmur or gallop Abd: soft, NT, Positive BS, obese Ext: 1+ right pretibial edema, trace left pretibial edema Skin: warm/dry no rash  Lab Results:  Basename 04/21/12 0445 04/20/12 0330  WBC 15.1* 13.8*  HGB 9.3* 9.0*  PLT 248 291    Basename 04/21/12 0445 04/20/12 0330  NA 140 133*  K 4.0 4.6  CL 108 100  CO2 23 21  GLUCOSE 150* 165*  BUN 55* 70*  CREATININE 1.21* 1.69*    Basename 04/20/12 1915 04/20/12 1130  TROPONINI 3.79* 3.62*    Cardiac Studies: 2D Echo pending.  Tele: personally reviewed. Sinus rhythm heart rate currently 90 bpm.  Assessment/Plan:  NSTEMI, Type 2 in setting of critical illness and known 3 vessel CAD. The patient has had septic shock with associated acute renal failure and 'shock liver,' both problems are improving. This explains her cardiac enzyme rise, which is relatively small considering the hemodynamic severity of her event. Would recommend conservative Rx unless she has refractory ischemic symptoms following extubation. Continue supportive care as you are doing. Restart aspirin 81 mg daily. Would avoid plavix as she was severely anemic on presentation and PCI (balloon angioplasty alone) was greater  than 6 months ago. Resume beta-blocker when hemodynamics are stable after extubation. No statin with markedly elevated LFT's. Await echo results, but suspect it will be technically difficult considering her body habitus. Will follow with you, thx.  Tonny Bollman, M.D. 04/21/2012, 7:52 AM

## 2012-04-21 NOTE — Progress Notes (Signed)
Per MD order- RT removed Arterial Catheter- procedure was uneventful- pressure held for approximately 8 minutes- pressure bandage applied.

## 2012-04-22 ENCOUNTER — Inpatient Hospital Stay (HOSPITAL_COMMUNITY): Payer: Medicare Other

## 2012-04-22 DIAGNOSIS — I369 Nonrheumatic tricuspid valve disorder, unspecified: Secondary | ICD-10-CM

## 2012-04-22 DIAGNOSIS — J811 Chronic pulmonary edema: Secondary | ICD-10-CM | POA: Diagnosis present

## 2012-04-22 DIAGNOSIS — I4891 Unspecified atrial fibrillation: Secondary | ICD-10-CM | POA: Diagnosis not present

## 2012-04-22 LAB — BASIC METABOLIC PANEL
BUN: 48 mg/dL — ABNORMAL HIGH (ref 6–23)
Calcium: 8.6 mg/dL (ref 8.4–10.5)
Creatinine, Ser: 0.98 mg/dL (ref 0.50–1.10)
GFR calc Af Amer: 65 mL/min — ABNORMAL LOW (ref >90)
GFR calc non Af Amer: 56 mL/min — ABNORMAL LOW (ref >90)
Glucose, Bld: 179 mg/dL — ABNORMAL HIGH (ref 70–99)

## 2012-04-22 LAB — CBC
HCT: 33.4 % — ABNORMAL LOW (ref 36.0–46.0)
Hemoglobin: 10.2 g/dL — ABNORMAL LOW (ref 12.0–15.0)
MCH: 25 pg — ABNORMAL LOW (ref 26.0–34.0)
MCHC: 30.5 g/dL (ref 30.0–36.0)
RBC: 4.08 MIL/uL (ref 3.87–5.11)

## 2012-04-22 LAB — PROCALCITONIN: Procalcitonin: 0.39 ng/mL

## 2012-04-22 LAB — CULTURE, RESPIRATORY W GRAM STAIN: Special Requests: NORMAL

## 2012-04-22 LAB — GLUCOSE, CAPILLARY
Glucose-Capillary: 153 mg/dL — ABNORMAL HIGH (ref 70–99)
Glucose-Capillary: 136 mg/dL — ABNORMAL HIGH (ref 70–99)
Glucose-Capillary: 183 mg/dL — ABNORMAL HIGH (ref 70–99)

## 2012-04-22 LAB — CARDIAC PANEL(CRET KIN+CKTOT+MB+TROPI): Troponin I: 1.25 ng/mL (ref <0.30)

## 2012-04-22 MED ORDER — FUROSEMIDE 10 MG/ML IJ SOLN
40.0000 mg | Freq: Once | INTRAMUSCULAR | Status: AC
  Administered 2012-04-22: 40 mg via INTRAVENOUS
  Filled 2012-04-22: qty 4

## 2012-04-22 MED ORDER — INSULIN GLARGINE 100 UNIT/ML ~~LOC~~ SOLN
10.0000 [IU] | Freq: Every day | SUBCUTANEOUS | Status: DC
  Administered 2012-04-22 – 2012-04-24 (×3): 10 [IU] via SUBCUTANEOUS

## 2012-04-22 MED ORDER — DILTIAZEM HCL 100 MG IV SOLR
5.0000 mg/h | INTRAVENOUS | Status: DC
  Administered 2012-04-22 (×2): 15 mg/h via INTRAVENOUS
  Administered 2012-04-22: 5 mg/h via INTRAVENOUS
  Filled 2012-04-22: qty 100

## 2012-04-22 MED ORDER — SODIUM CHLORIDE 0.9 % IJ SOLN
10.0000 mL | Freq: Two times a day (BID) | INTRAMUSCULAR | Status: DC
  Administered 2012-04-22 – 2012-04-24 (×4): 10 mL via INTRAVENOUS

## 2012-04-22 MED ORDER — DILTIAZEM HCL 25 MG/5ML IV SOLN
10.0000 mg | Freq: Once | INTRAVENOUS | Status: AC
  Administered 2012-04-22: 10 mg via INTRAVENOUS

## 2012-04-22 MED ORDER — INSULIN ASPART 100 UNIT/ML ~~LOC~~ SOLN
0.0000 [IU] | Freq: Three times a day (TID) | SUBCUTANEOUS | Status: DC
  Administered 2012-04-22: 3 [IU] via SUBCUTANEOUS
  Administered 2012-04-22 – 2012-04-23 (×2): 5 [IU] via SUBCUTANEOUS
  Administered 2012-04-23: 3 [IU] via SUBCUTANEOUS
  Administered 2012-04-23: 8 [IU] via SUBCUTANEOUS
  Administered 2012-04-24: 5 [IU] via SUBCUTANEOUS
  Administered 2012-04-24: 3 [IU] via SUBCUTANEOUS
  Administered 2012-04-24: 5 [IU] via SUBCUTANEOUS
  Administered 2012-04-25 (×2): 3 [IU] via SUBCUTANEOUS

## 2012-04-22 MED ORDER — INSULIN ASPART 100 UNIT/ML ~~LOC~~ SOLN
0.0000 [IU] | Freq: Every day | SUBCUTANEOUS | Status: DC
  Administered 2012-04-22 – 2012-04-24 (×3): 2 [IU] via SUBCUTANEOUS

## 2012-04-22 MED ORDER — METOPROLOL TARTRATE 1 MG/ML IV SOLN
5.0000 mg | Freq: Once | INTRAVENOUS | Status: AC
  Administered 2012-04-22: 5 mg via INTRAVENOUS

## 2012-04-22 NOTE — Plan of Care (Signed)
Problem: Phase II Progression Outcomes Goal: Time pt extubated/weaned off vent Outcome: Completed/Met Date Met:  04/22/12 1000AM

## 2012-04-22 NOTE — Progress Notes (Signed)
  Echocardiogram 2D Echocardiogram has been performed.  Cathie Beams 04/22/2012, 2:19 PM

## 2012-04-22 NOTE — Progress Notes (Signed)
eLink Physician-Brief Progress Note Patient Name: Holly Kim DOB: 1941-05-05 MRN: [id number]  Date of Service  04/22/2012   HPI/Events of Note  Patient with AF/RVR - rate has ranged from 120s up to 150s.  She has not responded to prn doses of lopressor.  BP is 108/60 (73).  Also with c/o of pain (back).  Has received medication for pain with no change in HR.   eICU Interventions  Plan: Diltiazem 10 mg bolus Start Dilt gtt for AF/RVR Has cardiac enzymes ordered   Intervention Category Intermediate Interventions: Arrhythmia - evaluation and management  DETERDING,ELIZABETH 04/22/2012, 4:23 AM

## 2012-04-22 NOTE — Progress Notes (Signed)
Name: Holly Kim MRN: [id number] DOB: Mar 21, 1941    LOS: 3  Referring Provider:  ED  Reason for Referral:  Sepsis / AMS  PULMONARY / CRITICAL CARE MEDICINE  PROFILE:  71 y/o F, SNF resident,  with PMH of PE/DVT (not on anticoagulation), DM, CAD, Obesity, HTN, COPD presented to [facility] ED 7/7 with AMS, acute renal failure, hyperkalemia, leukocytosis, lactic acid 1.9 / PCT 0.97.  Further decompensation requiring intubation in ED.  Recent Rx for UTI.  PCCM consulted for ICU admit.    Events Since Admission: 7/7 to ED with presumed UTI , concern for sepsis, anemia --decompensated requiring intubation. 7/8 + cardiac markers, Cards consult, conservative Rx 7/9 RASS 0, + F/C, passed SBT, extubated 7/10 AFRVR > dilt gtt started > converted back to NSR > diltiazem stopped  Current Status: Tolerating extubation without distress. Remains weak  Vital Signs: Temp:  [98.6 F (37 C)-99.9 F (37.7 C)] 98.6 F (37 C) (07/10 1230) Pulse Rate:  [72-139] 74  (07/10 1230) Resp:  [12-25] 12  (07/10 1230) BP: (79-138)/(55-87) 110/69 mmHg (07/10 1230) SpO2:  [70 %-100 %] 100 % (07/10 1230) Weight:  [126.5 kg (278 lb 14.1 oz)] 126.5 kg (278 lb 14.1 oz) (07/10 0000)  Physical Examination: Gen: chronically ill appearing, NAD, cognition intact HEENT: WNL PULM: clear anteriorly CV: RRR s M ABD: nabs, soft, NT Ext: warm, edema noted Neuro:no focal deficits  Principal Problem:  *Acute respiratory failure Active Problems:  NSTEMI (non-ST elevated myocardial infarction)  Elevated LFTs  Atrial fibrillation  Pulmonary edema  Recent UTI (urinary tract infection)  Hypotension   ASSESSMENT AND PLAN  PULMONARY  CXR: increased edema pattern  ETT:  7/7 >> 7/9  A:   Acute Respiratory Failure -intubated for AMS, hypotension, severe anemia, elevated cardiac markers, presumed pulm edema  P:   - Monitor inICU/SDU post extubation - diurese further   CARDIOVASCULAR  Lab 04/22/12 0400  04/20/12 1915 04/20/12 1130 04/20/12 0330 04/19/12 1620 04/19/12 1451  TROPONINI 1.25* 3.79* 3.62* 1.84* <0.30 --  LATICACIDVEN -- -- -- 1.2 -- 1.9  PROBNP 30535.0* -- -- -- 21491.0* --   ECG:   Lines:   7/7 Fem TLC>>>7/7 7/7 R IJ CVL >>> 7/8 Echo >>   A:  Hypotension, resolved Hx of HLD - holding statin due to elevated LFTs Hx of HTN -on ACE, bisoprolol Hx of CAD/CHF plavix, ASA and demadex at SNF Elevated Troponin, demand ischemia Paroxysmal AF  P:  - Cont ASA, beta blocker - Holding Plavix due to anemia and > 6 mos since stent placement   RENAL  Lab 04/22/12 0400 04/21/12 0445 04/20/12 0330 04/19/12 1451  NA 142 140 133* 128*  K 4.6 4.0 -- --  CL 108 108 100 89*  CO2 22 23 21 29   BUN 48* 55* 70* 78*  CREATININE 0.98 1.21* 1.69* 2.08*  CALCIUM 8.6 7.9* 7.6* 8.8  MG -- -- 2.3 --  PHOS -- -- 4.7* --   Intake/Output      07/09 0701 - 07/10 0700 07/10 0701 - 07/11 0700   I.V. (mL/kg) 600 (4.7) 180 (1.4)   NG/GT 180    IV Piggyback     Total Intake(mL/kg) 780 (6.2) 180 (1.4)   Urine (mL/kg/hr) 1110 (0.4) 365 (0.4)   Total Output 1110 365   Net -330 -185        Stool Occurrence 1 x     Foley:  7/7>>>  A:   Acute Renal Failure resolved Hyponatremia  resolved  P:   - Limit IVFs and monitor BMET. Uo   GASTROINTESTINAL  Lab 04/21/12 0445 04/20/12 0330 04/19/12 1451  AST 1015* 2905* 3556*  ALT 1389* 2035* 1907*  ALKPHOS 161* 143* 91  BILITOT 0.4 0.6 0.3  PROT 6.0 5.9* 6.0  ALBUMIN 1.8* 1.8* 1.8*    A:   Elevated LFT's - shock/congestion + statins  P:   - recheck LFTs 7/11   HEMATOLOGIC  Lab 04/22/12 0400 04/21/12 0445 04/20/12 0330 04/19/12 1451  HGB 10.2* 9.3* 9.0* 6.3*  HCT 33.4* 28.5* 28.4* 20.7*  PLT 328 248 291 321  INR -- -- 1.64* 1.63*  APTT -- -- 36 42*   A:   Anemia, mild  P:  -transfused 2 unit PRBC's on admission -monitor cbc daily for now.   INFECTIOUS  Lab 04/22/12 0400 04/21/12 0445 04/20/12 0330 04/19/12 1620  04/19/12 1451 04/19/12 1450  WBC 17.8* 15.1* 13.8* -- 15.6* --  PROCALCITON 0.39 -- -- 0.88 -- 0.97   Cultures: 7/7 UC>>> NEG 7/7 BCx2>>> ngtd 7/10 >>  7/7 Sputum>> NEG  Antibiotics: Vanco 7/7 (sepsis)>>> 7/9 Meropenem (sepsis) 7/7>>> 7/9  A:   Doubt sepsis given persistently low PCT and lack of identifiable focus  P:   - Follow up abx   ENDOCRINE  Lab 04/22/12 1158 04/22/12 0745 04/22/12 0343 04/21/12 2330 04/21/12 1952  GLUCAP 190* 183* 136* 153* 146*   A:   Hyperglycemia -in setting of stress. No hx of DM.     P:   - cont SSI - change to ACHS 7/10   NEUROLOGIC A:   AMS in setting of acute illness, ARF - resolved  P:   - monitor    BEST PRACTICE / DISPOSITION Level of Care:  SDU Primary Service:  PCCM  Consultants:  Cards 7/8 Code Status:  FULL Diet:  NPO post extubation DVT Px:  Heparin  GI Px:  protonix    Billy Fischer, MD ; [facility] service Mobile [phone].  After 5:30 PM or weekends, call [phone]

## 2012-04-23 ENCOUNTER — Inpatient Hospital Stay (HOSPITAL_COMMUNITY): Payer: Medicare Other

## 2012-04-23 DIAGNOSIS — R32 Unspecified urinary incontinence: Secondary | ICD-10-CM | POA: Diagnosis present

## 2012-04-23 DIAGNOSIS — I4891 Unspecified atrial fibrillation: Secondary | ICD-10-CM

## 2012-04-23 DIAGNOSIS — R5381 Other malaise: Secondary | ICD-10-CM | POA: Diagnosis present

## 2012-04-23 DIAGNOSIS — E119 Type 2 diabetes mellitus without complications: Secondary | ICD-10-CM | POA: Diagnosis present

## 2012-04-23 LAB — HEPATIC FUNCTION PANEL
ALT: 797 U/L — ABNORMAL HIGH (ref 0–35)
AST: 180 U/L — ABNORMAL HIGH (ref 0–37)
Indirect Bilirubin: 0.3 mg/dL (ref 0.3–0.9)
Total Protein: 6.3 g/dL (ref 6.0–8.3)

## 2012-04-23 LAB — GLUCOSE, CAPILLARY
Glucose-Capillary: 232 mg/dL — ABNORMAL HIGH (ref 70–99)
Glucose-Capillary: 236 mg/dL — ABNORMAL HIGH (ref 70–99)

## 2012-04-23 LAB — BASIC METABOLIC PANEL
Chloride: 105 mEq/L (ref 96–112)
GFR calc Af Amer: 71 mL/min — ABNORMAL LOW (ref >90)
Potassium: 4.3 mEq/L (ref 3.5–5.1)
Sodium: 139 mEq/L (ref 135–145)

## 2012-04-23 MED ORDER — METOPROLOL TARTRATE 1 MG/ML IV SOLN: 2.5000 mg | INTRAVENOUS | Status: DC | PRN

## 2012-04-23 MED ORDER — DILTIAZEM HCL 100 MG IV SOLR
5.0000 mg/h | INTRAVENOUS | Status: DC
  Administered 2012-04-23: 5 mg/h via INTRAVENOUS
  Administered 2012-04-23: 10 mg/h via INTRAVENOUS

## 2012-04-23 MED ORDER — ASPIRIN 81 MG PO CHEW
81.0000 mg | CHEWABLE_TABLET | Freq: Every day | ORAL | Status: DC
  Administered 2012-04-23 – 2012-04-25 (×3): 81 mg via ORAL
  Filled 2012-04-23 (×3): qty 1

## 2012-04-23 MED ORDER — FUROSEMIDE 10 MG/ML IJ SOLN
40.0000 mg | Freq: Once | INTRAMUSCULAR | Status: AC
  Administered 2012-04-23: 40 mg via INTRAVENOUS
  Filled 2012-04-23: qty 4

## 2012-04-23 MED ORDER — BISOPROLOL FUMARATE 5 MG PO TABS
5.0000 mg | ORAL_TABLET | Freq: Every day | ORAL | Status: DC
  Administered 2012-04-23 – 2012-04-25 (×3): 5 mg via ORAL
  Filled 2012-04-23 (×3): qty 1

## 2012-04-23 MED ORDER — DILTIAZEM LOAD VIA INFUSION
10.0000 mg | Freq: Once | INTRAVENOUS | Status: AC
  Administered 2012-04-23: 10 mg via INTRAVENOUS

## 2012-04-23 MED ORDER — DILTIAZEM HCL ER 60 MG PO CP12
60.0000 mg | ORAL_CAPSULE | Freq: Two times a day (BID) | ORAL | Status: DC
  Administered 2012-04-23 – 2012-04-24 (×3): 60 mg via ORAL
  Filled 2012-04-23 (×4): qty 1

## 2012-04-23 MED ORDER — OXYCODONE HCL 5 MG PO TABS
5.0000 mg | ORAL_TABLET | Freq: Four times a day (QID) | ORAL | Status: DC | PRN
  Administered 2012-04-23 – 2012-04-24 (×4): 5 mg via ORAL
  Filled 2012-04-23 (×4): qty 1

## 2012-04-23 NOTE — Progress Notes (Signed)
HR increased to over 150 for the second time tonight. Pt states she has feeling of "indigestion" in epigastric area when this occurs. Lopressor 5mg  given IV, both times this converted HR to 90's to low 100's. Pt states "indigestion" improves as HR decreases

## 2012-04-23 NOTE — Progress Notes (Signed)
    Subjective:  Feeling better today. Complains of a feeling of "indigestion" and epigastric discomfort. Denies chest pain or shortness of breath. Denies palpitations.  Objective:  Vital Signs in the last 24 hours: Temp:  [98.2 F (36.8 C)-99.1 F (37.3 C)] 98.2 F (36.8 C) (07/11 0800) Pulse Rate:  [55-146] 78  (07/11 0800) Resp:  [11-25] 11  (07/11 0800) BP: (102-134)/(46-86) 123/74 mmHg (07/11 0800) SpO2:  [93 %-100 %] 100 % (07/11 0800)  Intake/Output from previous day: 07/10 0701 - 07/11 0700 In: 1790 [P.O.:1200; I.V.:590] Out: 2090 [Urine:2090]  Physical Exam: Pt is alert and oriented, obese, chronically ill-appearing woman in NAD HEENT: normal Neck: JVP - normal Lungs: Clear anteriorly CV: Irregularly irregular, distant heart sounds Abd: soft, obese, mild diffuse tenderness, positive bowel sounds, no rebound or guarding Ext: no C/C/E, distal pulses intact and equal Skin: warm/dry no rash   Lab Results:  Basename 04/22/12 0400 04/21/12 0445  WBC 17.8* 15.1*  HGB 10.2* 9.3*  PLT 328 248    Basename 04/23/12 0500 04/22/12 0400  NA 139 142  K 4.3 4.6  CL 105 108  CO2 27 22  GLUCOSE 208* 179*  BUN 40* 48*  CREATININE 0.91 0.98    Basename 04/22/12 0400 04/20/12 1915  TROPONINI 1.25* 3.79*    Cardiac Studies: 2D Echo: Study Conclusions  - Left ventricle: Unable to evaluate EF due to poor image quality. Appears normal to possibly mildly reduced The cavity size was normal. Wall thickness was increased in a pattern of mild LVH. - Tricuspid valve: Moderate regurgitation. - Pulmonary arteries: PA peak pressure: 64mm Hg (S). - Impressions: Significant increase in PA pressures base on TR velocity Impressions:  - Significant increase in PA pressures base on TR velocity  Tele: Atrial fib with RVR, heart rate currently about 100-110.  Assessment/Plan:  1. Non-STEMI in the setting of critical illness in patient with known three-vessel coronary artery  disease, suspect demand ischemic event.  2. Atrial fibrillation with RVR , currently rate controlled on IV Cardizem  3. Acute respiratory failure, improved  4. Acute kidney injury, improving  The patient's echocardiogram was of poor quality, but there was no significant LV dysfunction identified. The patient is noted to have severe pulmonary hypertension based on her percussion regurgitation velocity. Her situation is now complicated by atrial fibrillation with RVR. She has reasonable rate control and IV Cardizem and I would continue this for now. If okay with the pulmonary team, I would recommend restarting bisoprolol 5 mg daily which she has previously been on and tolerated. I'm not sure that she is going to be a candidate for chronic anticoagulation with her anemia and multiple medical comorbidities. For now I would recommend continuing on DVT prophylaxis dose heparin and I will further discuss long-term anticoagulation with her.  Tonny Bollman, M.D. 04/23/2012, 10:04 AM

## 2012-04-23 NOTE — Progress Notes (Signed)
Name: Holly Kim MRN: [id number] DOB: 01-26-1941    LOS: 4  Referring Provider:  ED  Reason for Referral:  Sepsis / AMS  PULMONARY / CRITICAL CARE MEDICINE  PROFILE:  71 y/o F, SNF resident,  with PMH of PE/DVT (not on anticoagulation), DM, CAD, Obesity, HTN, COPD presented to [facility] ED 7/7 with AMS, acute renal failure, hyperkalemia, leukocytosis, lactic acid 1.9 / PCT 0.97.  Further decompensation requiring intubation in ED.  Recent Rx for UTI.  PCCM consulted for ICU admit.    Events Since Admission: 7/7 to ED with presumed UTI , concern for sepsis, anemia --decompensated requiring intubation. 7/8 + cardiac markers (troponin peak 3.79), Cards consult, conservative Rx 7/9 RASS 0, + F/C, passed SBT, extubated 7/10 AFRVR > dilt gtt started > converted back to NSR > diltiazem stopped 7/11 recurrent AFRVR > dilt gtt resumed.  7/11 transition from dilt gtt to PO dilt and bisoprolol  MICRO: PCT 7/7: 0.97,   7/7: 0.88,   7/10: 0.39 UC 7/7 >> NEG BCx2 7/8 >> NEG  Sputum7/7 >> NEG  Antibiotics: Vanco 7/7 (sepsis)>>> 7/9 Meropenem (sepsis) 7/7>>> 7/9   ETT:  7/7 >> 7/9 Fem TLC 7/7 >>7/7 R IJ CVL 7/7 >> 7/11 Foley cath 7/7 >>   Current Status: No distress. C/O poor appetite. Denies dyspnea and pain  Vital Signs: Temp:  [98.2 F (36.8 C)-99.1 F (37.3 C)] 98.2 F (36.8 C) (07/11 1200) Pulse Rate:  [55-146] 75  (07/11 1200) Resp:  [11-22] 19  (07/11 1200) BP: (0-134)/(0-86) 121/62 mmHg (07/11 1200) SpO2:  [93 %-100 %] 94 % (07/11 1200)  Physical Examination: Gen: chronically ill appearing, NAD, cognition intact HEENT: WNL PULM: clear anteriorly CV: RRR s M ABD: nabs, soft, NT Ext: warm, edema noted Neuro:no focal deficits  BMET    Component Value Date/Time   NA 139 04/23/2012 0500   K 4.3 04/23/2012 0500   CL 105 04/23/2012 0500   CO2 27 04/23/2012 0500   GLUCOSE 208* 04/23/2012 0500   BUN 40* 04/23/2012 0500   CREATININE 0.91 04/23/2012 0500   CREATININE 1.16*  08/29/2011 1458   CALCIUM 8.5 04/23/2012 0500   GFRNONAA 61* 04/23/2012 0500   GFRAA 71* 04/23/2012 0500    CBC    Component Value Date/Time   WBC 17.8* 04/22/2012 0400   RBC 4.08 04/22/2012 0400   HGB 10.2* 04/22/2012 0400   HCT 33.4* 04/22/2012 0400   PLT 328 04/22/2012 0400   MCV 81.9 04/22/2012 0400   MCH 25.0* 04/22/2012 0400   MCHC 30.5 04/22/2012 0400   RDW 18.6* 04/22/2012 0400   LYMPHSABS 1.3 04/21/2012 0445   MONOABS 0.7 04/21/2012 0445   EOSABS 0.1 04/21/2012 0445   BASOSABS 0.0 04/21/2012 0445   Lab Results  Component Value Date   ALT 797* 04/23/2012   AST 180* 04/23/2012   ALKPHOS 138* 04/23/2012   BILITOT 0.4 04/23/2012    CXR: NSC  ASSESSMENT:  Principal Problem:  *Acute respiratory failure Active Problems:  Atrial fibrillation, paroxysmal  Pulmonary edema  Physical deconditioning  Incontinence of urine  Elevated troponin, deemed to be demand ischemia  DM type 2 (diabetes mellitus, type 2)  Hypotension - resolved  Elevated LFTs, likely congestion/hypotension - improving Hx of HLD -  Hx of HTN - Hx of Htn, hyperlipidemia, CAD, CHF   bisoprolol, ACEI, plavix, ASA and demadex prior to admission   PLAN:  Monitor in SDU X 1 more day Transition to oral meds for rate control D/C  CVL Continue Foley due to incontinence Physical therapy consult requested Resume statin therapy as LFTs are resolving Continue Lantus and SSI - adjust Lantus as oral intake improves  Son updated @ bedside   Billy Fischer, MD ; [facility] [phone].  After 5:30 PM or weekends, call [phone]

## 2012-04-23 NOTE — Progress Notes (Signed)
Physical therapy discontinue note-- Pt resides at SNF and has not ambulated fro 20 years. Acute PT not indicated at this time. RN in agreement. Blanchard Kelch PT [phone]

## 2012-04-23 NOTE — Significant Event (Signed)
Pt with A fib and RVR.  Will resume cardizem gtt.  Coralyn Helling, MD 04/23/2012, 2:31 AM

## 2012-04-24 LAB — BASIC METABOLIC PANEL
Calcium: 8.4 mg/dL (ref 8.4–10.5)
GFR calc Af Amer: 78 mL/min — ABNORMAL LOW (ref >90)
GFR calc non Af Amer: 67 mL/min — ABNORMAL LOW (ref >90)
Glucose, Bld: 195 mg/dL — ABNORMAL HIGH (ref 70–99)
Potassium: 4.1 mEq/L (ref 3.5–5.1)
Sodium: 137 mEq/L (ref 135–145)

## 2012-04-24 LAB — GLUCOSE, CAPILLARY: Glucose-Capillary: 229 mg/dL — ABNORMAL HIGH (ref 70–99)

## 2012-04-24 LAB — PRO B NATRIURETIC PEPTIDE: Pro B Natriuretic peptide (BNP): 34946 pg/mL — ABNORMAL HIGH (ref 0–125)

## 2012-04-24 MED ORDER — DILTIAZEM HCL ER 90 MG PO CP12
90.0000 mg | ORAL_CAPSULE | Freq: Two times a day (BID) | ORAL | Status: DC
  Administered 2012-04-24 – 2012-04-25 (×2): 90 mg via ORAL
  Filled 2012-04-24 (×3): qty 1

## 2012-04-24 MED ORDER — SPIRONOLACTONE 25 MG PO TABS
25.0000 mg | ORAL_TABLET | Freq: Two times a day (BID) | ORAL | Status: DC
  Administered 2012-04-24 – 2012-04-25 (×2): 25 mg via ORAL
  Filled 2012-04-24 (×5): qty 1

## 2012-04-24 MED ORDER — ATORVASTATIN CALCIUM 40 MG PO TABS
40.0000 mg | ORAL_TABLET | Freq: Every day | ORAL | Status: DC
  Administered 2012-04-24: 40 mg via ORAL
  Filled 2012-04-24 (×2): qty 1

## 2012-04-24 MED ORDER — ENSURE PUDDING PO PUDG
1.0000 | Freq: Three times a day (TID) | ORAL | Status: DC
  Administered 2012-04-24 – 2012-04-25 (×3): 1 via ORAL
  Filled 2012-04-24 (×5): qty 1

## 2012-04-24 MED ORDER — FUROSEMIDE 10 MG/ML IJ SOLN
80.0000 mg | Freq: Once | INTRAMUSCULAR | Status: AC
  Administered 2012-04-24: 80 mg via INTRAVENOUS
  Filled 2012-04-24: qty 8

## 2012-04-24 MED ORDER — FUROSEMIDE 40 MG PO TABS
40.0000 mg | ORAL_TABLET | Freq: Every day | ORAL | Status: DC
  Administered 2012-04-25: 40 mg via ORAL
  Filled 2012-04-24: qty 1

## 2012-04-24 MED ORDER — OXYCODONE HCL 5 MG PO TABS
5.0000 mg | ORAL_TABLET | ORAL | Status: DC | PRN
  Administered 2012-04-24: 10 mg via ORAL
  Administered 2012-04-24: 5 mg via ORAL
  Administered 2012-04-25 (×2): 10 mg via ORAL
  Filled 2012-04-24 (×4): qty 2

## 2012-04-24 MED ORDER — OXYCODONE HCL 5 MG PO TABS: 5.0000 mg | ORAL_TABLET | ORAL | Status: DC | PRN

## 2012-04-24 NOTE — Progress Notes (Addendum)
Palliative Medicine Team received consult for goals of care requested by Dr Sung Amabile.  Chart reviewed, pt admitted 7/7 from [facility] with AMS, acute renal failure, hyperkalemia, leukocytosis, decompensated requiring intubation in ED; extubated 7/9- TRH will assume care 7/13- Recent Rx for UTI. Initial dx was PNA. Subsequent course suggested pulm edema;now w NSTEMI; PMH of PE/DVT (not on anticoagulation), DM, CAD, Obesity, HTN, COPD  Spoke with patient at bedside she; pt frowning, continually rolling tongue around lips- stated she "is worried because she had been told the doctors were thinking about hospice and she didn't want to die" talked with patient to try to explain difference between hospice and palliative care; that having this meeting with PMT did not mean she would not go back to her facility which she states is like home to her; emotional support given -though did not feel patient's concerns totally alleviated; patient agreeable to meeting for further discussion with PMT provider, with her son,contacted [name] [phone])  Meeting scheduled for tomorrow Saturday 7/13 @ 12 noon. [name] will make sure other family also aware of meeting.   Valente David, RN  04/02/2012, 5:28 PM  Palliative Medicine Team RN Liaison  [phone]

## 2012-04-24 NOTE — Progress Notes (Addendum)
Name: Holly Kim MRN: [id number] DOB: 06/04/1941    LOS: 5  Referring Provider:  ED  Reason for Referral:  Sepsis / AMS  PULMONARY / CRITICAL CARE MEDICINE  PROFILE:  71 y/o F, SNF resident,  with PMH of PE/DVT (not on anticoagulation), DM, CAD, Obesity, HTN, COPD presented to [facility] ED 7/7 with AMS, acute renal failure, hyperkalemia, leukocytosis, lactic acid 1.9 / PCT 0.97.  Further decompensation requiring intubation in ED.  Recent Rx for UTI. Initial dx was PNA. Subsequent course suggested pulm edema.  PCCM consulted for ICU admit.    Events Since Admission: 7/7 to ED with presumed UTI , concern for sepsis, anemia --decompensated requiring intubation. 7/8 + cardiac markers (troponin peak 3.79), Cards consult, conservative Rx 7/9 RASS 0, + F/C, passed SBT, extubated 7/10 AFRVR > dilt gtt started > converted back to NSR > diltiazem stopped 7/11 recurrent AFRVR > dilt gtt resumed.  7/11 transition from dilt gtt to PO dilt and bisoprolol  MICRO: PCT 7/7: 0.97,   7/7: 0.88,   7/10: 0.39 UC 7/7 >> NEG BCx2 7/8 >> NEG  Sputum7/7 >> NEG  Antibiotics: Vanco 7/7 (sepsis)>>> 7/9 Meropenem (sepsis) 7/7>>> 7/9   ETT:  7/7 >> 7/9 Fem TLC 7/7 >>7/7 R IJ CVL 7/7 >> (remains in place due to inability to place PIV) Foley cath 7/7 >> (left in due to long standing urinary incontinence)  Current Status: No distress. Appetite improving. Denies dyspnea and pain  Vital Signs: Temp:  [98.1 F (36.7 C)-98.8 F (37.1 C)] 98.1 F (36.7 C) (07/12 0745) Pulse Rate:  [75-81] 81  (07/12 0745) Resp:  [16-27] 27  (07/12 0745) BP: (0-139)/(0-83) 111/50 mmHg (07/12 0745) SpO2:  [93 %-100 %] 100 % (07/12 0745)  Physical Examination: Gen: chronically ill appearing, NAD, cognition intact HEENT: WNL PULM: clear anteriorly CV: RRR s M ABD: nabs, soft, NT Ext:  cool, trace edema  Neuro: no new focal deficits  BMET    Component Value Date/Time   NA 137 17-May-2012 0400   K 4.1 05-17-12  0400   CL 103 May 17, 2012 0400   CO2 27 05-17-2012 0400   GLUCOSE 195* 17-May-2012 0400   BUN 32* 05/17/12 0400   CREATININE 0.84 05/17/12 0400   CREATININE 1.16* 08/29/2011 1458   CALCIUM 8.4 17-May-2012 0400   GFRNONAA 67* 05/17/12 0400   GFRAA 78* May 17, 2012 0400    CBC    Component Value Date/Time   WBC 17.8* 04/22/2012 0400   RBC 4.08 04/22/2012 0400   HGB 10.2* 04/22/2012 0400   HCT 33.4* 04/22/2012 0400   PLT 328 04/22/2012 0400   MCV 81.9 04/22/2012 0400   MCH 25.0* 04/22/2012 0400   MCHC 30.5 04/22/2012 0400   RDW 18.6* 04/22/2012 0400   LYMPHSABS 1.3 04/21/2012 0445   MONOABS 0.7 04/21/2012 0445   EOSABS 0.1 04/21/2012 0445   BASOSABS 0.0 04/21/2012 0445   Lab Results  Component Value Date   ALT 797* 04/23/2012   AST 180* 04/23/2012   ALKPHOS 138* 04/23/2012   BILITOT 0.4 04/23/2012    CXR: No new film  ASSESSMENT:  Principal Problem:  *Acute respiratory failure Active Problems:  Atrial fibrillation, paroxysmal >> NSR 7/12 AM  Pulmonary edema  Physical deconditioning  Incontinence of urine  Elevated troponin, deemed to be demand ischemia  DM type 2 (diabetes mellitus, type 2)  Hypotension - resolved  Elevated LFTs, likely congestion/hypotension - improving Hx of HLD -  Hx of HTN - Hx of Htn,  hyperlipidemia, CAD, CHF   bisoprolol, ACEI, plavix, ASA and demadex prior to admission   PLAN:  -Transfer to Tele -Cont PO bisoprolol and dlitiazem  -CVL to remain in place due to inability to place PIV  -Continue Foley due to incontinence -Physical therapy consult requested - signed off due to longstanding nonambulatory condition -Resume statin therapy as LFTs are resolving -Continue Lantus and SSI - adjust Lantus as oral intake improves. CBGs in 100-200 range OK -Palliative Care Consult requested to address goals of care. Her baseline functional status is very poor andit would be reasonable to establish limitations such as no ACLS or no re-intubation. Patient has been informed  that [facility] medicine will see her   TRH to assume care as of 7/13 AM and PCCM will sign off. Please call if we can be of further assistance. Discussed with Dr Sherrilee Gilles   Billy Fischer, MD ; [facility] [phone].  After 5:30 PM or weekends, call [phone]

## 2012-04-24 NOTE — Progress Notes (Signed)
At 1320 patient transferred to 1420 via hospital bed with her belongings. Report earlier given to receiving nurse. Pt "scared" at her transfer, especially with the palliative consult put in by her care provider. Patient given emotional support and explanations to allay her fear. Also Chaplain called in per her request. Palliative car RN also came and addressed her fears before her her transfer to 1420.

## 2012-04-24 NOTE — Progress Notes (Signed)
    Subjective:  Feels congested. C/O cough and 'pain all over.'  Objective:  Vital Signs in the last 24 hours: Temp:  [98.1 F (36.7 C)-98.8 F (37.1 C)] 98.1 F (36.7 C) (07/12 0430) Pulse Rate:  [75-80] 80  (07/12 0430) Resp:  [11-26] 26  (07/12 0430) BP: (0-139)/(0-83) 106/42 mmHg (07/12 0430) SpO2:  [93 %-100 %] 100 % (07/12 0430)  Intake/Output from previous day: 07/11 0701 - 07/12 0700 In: 1720 [P.O.:1200; I.V.:520] Out: 2140 [Urine:2140]  Physical Exam: Pt is alert and oriented, obese chronically ill-appearing woman in NAD HEENT: normal Neck: JVP - can't estimate Lungs: diffuse rhonchi CV: irregularly irregular without murmur or gallop Abd: soft, NT, Positive BS, obese Ext: 1+ pretibial edema bilaterally, distal pulses intact and equal Skin: warm/dry no rash   Lab Results:  Basename 04/22/12 0400  WBC 17.8*  HGB 10.2*  PLT 328    Basename 30-Apr-2012 0400 04/23/12 0500  NA 137 139  K 4.1 4.3  CL 103 105  CO2 27 27  GLUCOSE 195* 208*  BUN 32* 40*  CREATININE 0.84 0.91    Basename 04/22/12 0400  TROPONINI 1.25*   Tele: atrial fibrillation, rate controlled in 70-80's, personally reviewed  Assessment/Plan:  1. Respiratory failure, improved 2. Acute on chronic diastolic CHF. Markedly elevated BNP 3. NSTEMI, Type 2 4. Atrial fibrillation, rate-controlled on bisoprolol and oral diltiazem 5. Morbid obesity 6. Anemia  Recommend continue current rate-control meds. I reviewed her chart and her baseline HgB is in the range of 7 mg/dL. She has had an anemia workup in the past and this has been unrevealing per the patient's report. I think considering her multiple comorbidities and chronic anemia, it is best to avoid long-term anticoagulation. Continue ASA 81 mg daily. Will give IV lasix this am as I suspect diastolic CHF is contributing to her compromised respiratory situation.   Tonny Bollman, M.D. 04-30-2012, 7:03 AM

## 2012-04-24 NOTE — Progress Notes (Signed)
Palliative Medicine Team SW Alerted to pt's anxiety and psychosocial need by PMT RN. Note Chaplain involvement and established relationship, will follow along as needed.   Holly Kim, [place] Pager [phone]

## 2012-04-24 NOTE — Progress Notes (Signed)
Patient better but still anxious. She has spoken to her brother and is hopeful that some family will come tomorrow to talk to the medical staff.  Will continue to follow and support.  05-18-2012 1600  Clinical Encounter Type  Visited With Patient  Visit Type Follow-up  Recommendations Follow up  Spiritual Encounters  Spiritual Needs Emotional;Other (Comment) (Presence)

## 2012-04-24 NOTE — Progress Notes (Signed)
Patient received news of "Hospice" referral. She told chaplain that she is afraid to die. We explored this. She said,"I knew I would die, but didn't think it would be this soon." From what she said, it is probable that she is grieving her own dying and what that means--not seeing her children and grandchildren and leaving the long term care facility that she says she "loves." She is a person of faith, but she is not utilizing her faith to cope yet. She may need to adjust to the new reality. Assured her that she will not be alone and that we and others will continue to care for her. Talked about what she might want to tell her children and grandchildren in the time she has left. Support, presence, prayer.  05/07/12 1300  Clinical Encounter Type  Visited With Patient  Visit Type Initial  Referral From Nurse  Recommendations Follow up  Spiritual Encounters  Spiritual Needs Prayer;Emotional;Grief support  Stress Factors  Patient Stress Factors Loss

## 2012-04-24 NOTE — Clinical Social Work Note (Signed)
CSW continues to follow for support and return to [facility]. Pt voiced concern about "hospice consult". CSW explained further to Pt what GOC with PMT actually entails. CSW explained to Pt "how if you have been sick a long time that sometimes it is good to have a plan if you get sicker".  Pt somewhat reassured to have further understanding of GOC. CSW provided supportive listening. Pt would like to return to [facility] when ready for d/c.  CSW to follow for return to SNF.   Doreen Salvage, LCSWA Clinical Social Worker [facility] Cell [phone]

## 2012-04-25 DIAGNOSIS — R531 Weakness: Secondary | ICD-10-CM

## 2012-04-25 DIAGNOSIS — I519 Heart disease, unspecified: Secondary | ICD-10-CM

## 2012-04-25 DIAGNOSIS — R5381 Other malaise: Secondary | ICD-10-CM

## 2012-04-25 DIAGNOSIS — R630 Anorexia: Secondary | ICD-10-CM

## 2012-04-25 DIAGNOSIS — R627 Adult failure to thrive: Secondary | ICD-10-CM

## 2012-04-25 LAB — BASIC METABOLIC PANEL
Chloride: 102 mEq/L (ref 96–112)
GFR calc Af Amer: >90 mL/min (ref >90)
GFR calc non Af Amer: 81 mL/min — ABNORMAL LOW (ref >90)
Potassium: 4.6 mEq/L (ref 3.5–5.1)
Sodium: 139 mEq/L (ref 135–145)

## 2012-04-25 LAB — CBC
HCT: 34.4 % — ABNORMAL LOW (ref 36.0–46.0)
Hemoglobin: 10.3 g/dL — ABNORMAL LOW (ref 12.0–15.0)
RBC: 4.1 MIL/uL (ref 3.87–5.11)

## 2012-04-25 LAB — CULTURE, BLOOD (ROUTINE X 2): Culture: NO GROWTH

## 2012-04-25 LAB — GLUCOSE, CAPILLARY
Glucose-Capillary: 179 mg/dL — ABNORMAL HIGH (ref 70–99)
Glucose-Capillary: 189 mg/dL — ABNORMAL HIGH (ref 70–99)

## 2012-04-25 MED ORDER — DILTIAZEM HCL ER 90 MG PO CP12: 90.0000 mg | ORAL_CAPSULE | Freq: Two times a day (BID) | ORAL | Status: AC

## 2012-04-25 MED ORDER — SPIRONOLACTONE 25 MG PO TABS: 25.0000 mg | ORAL_TABLET | Freq: Two times a day (BID) | ORAL | Status: AC

## 2012-04-25 MED ORDER — ENSURE PUDDING PO PUDG: 1.0000 | Freq: Three times a day (TID) | ORAL | Status: AC

## 2012-04-25 MED ORDER — INSULIN GLARGINE 100 UNIT/ML ~~LOC~~ SOLN: 10.0000 [IU] | Freq: Every day | SUBCUTANEOUS | Status: AC

## 2012-04-25 NOTE — Progress Notes (Addendum)
[facility] d/c'ed per order for d/c today with blue tip intact.  Pressure applied to site- petrolateum guaze pressure dressing also applied to site.  No signs of infection present, no bleeding noted from site, small drop of serous drainage noted at site.  Pt does c/o tenderness at site.  No ADN.

## 2012-04-25 NOTE — Progress Notes (Signed)
Pt to be d/c today to  [facility].  Pt and family agreeable. Confirmed plans with facility.  Plan transfer via EMS.   Leron Croak, LCSWA [employer] [phone]

## 2012-04-25 NOTE — Progress Notes (Signed)
CSW spoke with nurse Ailene Rud concerning Pt returning to facility today. Ms. Holly Kim was able to confirm that Pt could return over the weekend.   CSW faxed over the d/c summary and AVS and attempted to contact the nurse back with no success.   CSW is waiting for Pt  acceptance back into the facility.   Holly Kim, LCSWA [employer] [phone]

## 2012-04-25 NOTE — Progress Notes (Signed)
 Patient ID: Holly Kim, female   DOB: 05-May-1941, 71 y.o.   MRN: [id number]    Subjective:  Weak and dyspnic.  Normally not OOB at assisted living   Objective:  Vital Signs in the last 24 hours: Temp:  [97.6 F (36.4 C)-98.6 F (37 C)] 97.7 F (36.5 C) (07/13 0527) Pulse Rate:  [27-108] 105  (07/13 0527) Resp:  [18-22] 20  (07/13 0527) BP: (99-140)/(50-87) 140/87 mmHg (07/13 0527) SpO2:  [87 %-100 %] 98 % (07/13 0527) Weight:  [132.7 kg (292 lb 8.8 oz)] 132.7 kg (292 lb 8.8 oz) (07/12 1540)  Intake/Output from previous day: 07/12 0701 - 07/13 0700 In: 468 [P.O.:240; I.V.:220; IV Piggyback:8] Out: 1375 [Urine:1375]  Physical Exam: Pt is alert and oriented, obese chronically ill-appearing woman in NAD HEENT: normal Neck: JVP - can't estimate Lungs: diffuse rhonchi CV: irregularly irregular without murmur or gallop Abd: soft, NT, Positive BS, obese Ext: 1+ pretibial edema bilaterally, distal pulses intact and equal Skin: warm/dry no rash Right IJ triple lumen  Lab Results: No results found for this basename: WBC:2,HGB:2,PLT:2 in the last 72 hours  Basename  0400 04/23/12 0500  NA 137 139  K 4.1 4.3  CL 103 105  CO2 27 27  GLUCOSE 195* 208*  BUN 32* 40*  CREATININE 0.84 0.91   No results found for this basename: TROPONINI:2,CK,MB:2 in the last 72 hours Tele: atrial fibrillation, rate controlled in 70-80's, personally reviewed  Assessment/Plan:  1. Respiratory failure, improved 2. Acute on chronic diastolic CHF. Markedly elevated BNP 3. NSTEMI, Type 2 4. Atrial fibrillation, rate-controlled on bisoprolol and oral diltiazem 5. Morbid obesity 6. Anemia  Cardioilogy does not have much to add.  Obesity hypoventilation syndrom, primarily bed bound.  Agree with no coumadin given chronic anemia.  Diuretic and rate control Will sign off   Charlton Haws, M.D. 04/25/2012, 7:51 AM

## 2012-04-25 NOTE — Discharge Summary (Signed)
Physician Discharge Summary  Holly Kim UUV:[id number] DOB: 1940/12/13 DOA: 04/19/2012  PCP: Lloyd Huger, MD  Admit date: 04/19/2012 Discharge date: 04/25/2012  Recommendations for Outpatient Follow-up:  1. Pt will be discharged to SNF and will have palliative services follow along. 2. Please note that pt is limited code, does not want cardioversion or compression  3. Pt will need to have CMP and CBC checked 1 week post discharge to ensure that electrolytes, LFT's, Hg/Hct are stable and at pt's baseline 4. Please also note that narcotics and benzo's were held during the hospitalization and only low dose regimen recommended 5. Please watch weight as well  Discharge Diagnoses:  Acute on chronic respiratory failure secondary to acute on chronic diastolic CHF, morbid obesity and OHS, PNA  Principal Problem:  *Acute respiratory failure Active Problems:  Hypotension - resolved  Elevated LFTs, likely congestion/hypotension - improving  Atrial fibrillation, paroxysmal  Pulmonary edema  Physical deconditioning  Incontinence of urine  Elevated troponin, deemed to be demand ischemia  DM type 2 (diabetes mellitus, type 2)  Discharge Condition: Hemodynamically stable  Diet recommendation: Low sodium diet discussed  History of present illness:  71 y/o F, SNF resident, with PMH of PE/DVT (not on anticoagulation as pt was determined to be poor candidate), DM, CAD, Obesity, HTN, COPD presented to [facility] ED 7/7 with AMS, acute renal failure, hyperkalemia, leukocytosis, lactic acid 1.9 / PCT 0.97. Further decompensation requiring intubation in ED. Recent Rx for UTI. Initial dx was PNA. Subsequent course suggested pulm edema. PCCM consulted for ICU admit.   Hospital Course:  Principal Problem:  *Acute respiratory failure - this was determined to be multifactorial in etiology as secondary to pneumonia, acute on chronic diastolic heart failure, OHS, morbid obesity - pt has required intubated and  was extubated 2 days after the hospitalization - will check oxygen saturation at rest on RA to determine if pt needs oxygen at home - pt will continue ot follow with palliative care services upon discharge  Active Problems:  Hypotension - this was determined to be secondary to sepsis in the setting of PNA - now resolved and pt will go home on medication regimen noted below   Elevated LFTs, likely congestion/hypotension - this has improved over the course of hospital stay - LFT's noted to trend down   Atrial fibrillation, paroxysmal - this morning pt has irregular rate and rhythm but on exam HR 80 - 90's - continue current medication regimen - not coumadin candidate   Pulmonary edema - in the setting of chronic diastolic CHF with significantly elevated BNP - clinically at baseline   Physical deconditioning with progressive failure to thrive - will go back to SNF   Anemia of chronic disease - Hg and Hct have remained at baseline   Incontinence of urine   Elevated troponin, deemed to be demand ischemia   DM type 2 (diabetes mellitus, type 2)  MICRO:   PCT 7/7: 0.97, 7/7: 0.88, 7/10: 0.39   UC 7/7 >> NEG   BCx2 7/8 >> NEG   Sputum7/7 >> NEG   Antibiotics:  Vanco 7/7 (sepsis)>>> 7/9   Meropenem (sepsis) 7/7>>> 7/9   Procedures:  ETT: 7/7 >> 7/9   Fem TLC 7/7 >>7/7   R IJ CVL 7/7 >> (remains in place due to inability to place PIV)   Foley cath 7/7 >> (left in due to long standing urinary incontinence)  Consultations:  Critical care, PCCM  Cardiology  Discharge Exam: Filed Vitals:   04/25/12 4742  BP: 140/87  Pulse: 105  Temp: 97.7 F (36.5 C)  Resp: 20   Filed Vitals:   May 16, 2012 1450 2012-05-16 1540 05-16-2012 2138 04/25/12 0527  BP: 113/65 103/69 138/80 140/87  Pulse: 103 86 108 105  Temp: 98.6 F (37 C) 97.6 F (36.4 C) 98 F (36.7 C) 97.7 F (36.5 C)  TempSrc:  Oral Oral Oral  Resp: 18 18 18 20   Height:  5\' 5"  (1.651 m)    Weight:  132.7  kg (292 lb 8.8 oz)    SpO2: 100% 94% 100% 98%    General: Pt is alert, follows commands appropriately, not in acute distress Cardiovascular: Irregular rate and rhythm, no murmurs, no rubs, no gallops Respiratory: Diminished breath sounds at bases with crackles and rhonchi, no wheezing Abdominal: Soft, non tender, non distended, bowel sounds +, no guarding Extremities: trace bilateral lower extremity edema, no cyanosis, pulses palpable bilaterally DP and PT Neuro: Grossly nonfocal  Discharge Instructions  Discharge Orders    Future Orders Please Complete By Expires   Diet - low sodium heart healthy      Increase activity slowly        Medication List  As of 04/25/2012  1:47 PM   STOP taking these medications         cefUROXime 500 MG tablet      lisinopril 5 MG tablet      LORazepam 1 MG tablet      morphine 15 MG 12 hr tablet      saccharomyces boulardii 250 MG capsule      venlafaxine 75 MG tablet         TAKE these medications         aspirin 81 MG EC tablet   Take 81 mg by mouth daily.      atorvastatin 40 MG tablet   Commonly known as: LIPITOR   Take 40 mg by mouth daily.      bisoprolol 5 MG tablet   Commonly known as: ZEBETA   Take 5 mg by mouth daily.      clonazePAM 0.5 MG tablet   Commonly known as: KLONOPIN   Take 0.25 mg by mouth 2 (two) times daily. Take 1/2 tablet twice daily      clopidogrel 75 MG tablet   Commonly known as: PLAVIX   Take 75 mg by mouth daily.      diltiazem 90 MG 12 hr capsule   Commonly known as: CARDIZEM SR   Take 1 capsule (90 mg total) by mouth every 12 (twelve) hours.      docusate sodium 100 MG capsule   Commonly known as: COLACE   Take 100 mg by mouth 2 (two) times daily.      feeding supplement Pudg   Take 1 Container by mouth 3 (three) times daily between meals.      ferrous sulfate 325 (65 FE) MG tablet   Take 325 mg by mouth daily with breakfast.      insulin aspart 100 UNIT/ML injection   Commonly known  as: novoLOG   Inject 3-24 Units into the skin 3 (three) times daily before meals. 16 Units with breakfast; 24 Units with lunch; 24 Units with supper Inject 3 units if CBG is greater than or equal to 150 before meals and at bedtime.      insulin glargine 100 UNIT/ML injection   Commonly known as: LANTUS   Inject 10 Units into the skin at bedtime.  multivitamin with minerals Tabs   Take 1 tablet by mouth daily.      nitroGLYCERIN 0.4 MG SL tablet   Commonly known as: NITROSTAT   Place 0.4 mg under the tongue every 5 (five) minutes as needed. For chest pain      omeprazole 20 MG capsule   Commonly known as: PRILOSEC   Take 40 mg by mouth daily.      oxyCODONE 5 MG immediate release tablet   Commonly known as: Oxy IR/ROXICODONE   Take 10 mg by mouth every 4 (four) hours as needed. pain      promethazine 12.5 MG tablet   Commonly known as: PHENERGAN   Take 12.5 mg by mouth every 6 (six) hours as needed. For nausea      senna 8.6 MG tablet   Commonly known as: SENOKOT   Take 3 tablets by mouth daily.      spironolactone 25 MG tablet   Commonly known as: ALDACTONE   Take 1 tablet (25 mg total) by mouth 2 (two) times daily.      sucralfate 1 G tablet   Commonly known as: CARAFATE   Take 1 g by mouth 3 (three) times daily before meals.      tiotropium 18 MCG inhalation capsule   Commonly known as: SPIRIVA   Place 18 mcg into inhaler and inhale daily.      torsemide 20 MG tablet   Commonly known as: DEMADEX   Take 20 mg by mouth daily.           Follow-up Information    Follow up with Lloyd Huger, MD.   Contact information:   [address] [place] [address] [phone]           The results of significant diagnostics from this hospitalization (including imaging, microbiology, ancillary and laboratory) are listed below for reference.    Significant Diagnostic Studies: Dg Abd 1 View  04/20/2012  *RADIOLOGY REPORT*  Clinical Data: Both  G tube placement for feeding  ABDOMEN - 1 VIEW  Comparison: None.  Findings: A supine portable film shows the tip of the NG tube to be in the region of the distal antrum - duodenal bulb.  The bowel gas pattern is nonspecific.  IMPRESSION: Tip of NG tube in the duodenal bulb or distal antrum.  Original Report Authenticated By: Juline Patch, M.D.   US Abdomen Complete  04/20/2012  *RADIOLOGY REPORT*  Clinical Data:  Acute liver failure  ABDOMINAL ULTRASOUND COMPLETE  Comparison:  12/15/2009 CT abdomen  Findings:  Gallbladder:  Small echogenic shadowing gallstones noted within the gallbladder.  Gallbladder wall is thickened measuring 9.2 mm. Additionally, there is pericholecystic fluid.  Despite these findings, no elicited Murphy's sign demonstrated.  Findings remain nonspecific.  Cholecystitis not excluded.  Other considerations include chronic liver disease, hypoproteinemia, or CHF.  Common Bile Duct:  Within normal limits in caliber.  Liver: No focal mass lesion identified.  Within normal limits in parenchymal echogenicity.  IVC:  Appears normal.  Pancreas:  Imaged portion unremarkable.  Entire pancreas not visualize because of obscuring bowel gas.  Spleen:  Within normal limits in size and echotexture.  Right kidney:  11.3 cm length.  Minimal pelvic fullness noted without hydronephrosis.  Small echogenic shadowing calculus in the lower pole measures 7 mm.  Scattered small renal cysts noted, largest inferiorly measuring 2.3 cm.  Left kidney:  Normal in size and parenchymal echogenicity.  No evidence of mass or hydronephrosis.  Abdominal Aorta:  No aneurysm identified.  Small right pleural effusion evident.  IMPRESSION: Cholelithiasis with associated gallbladder wall thickening and surrounding pericholecystic fluid.  Despite these findings no Murphy's sign elicited.  Findings remain indeterminate for cholecystitis versus chronic liver disease, hypoproteinemia, or CHF.  No biliary dilatation  Nonobstructing right  nephrolithiasis  Scattered small right renal cysts  Mild right renal pelviectasis  Small right effusion  *RADIOLOGY REPORT*  Clinical Data: Hepatic failure  DUPLEX ULTRASOUND OF LIVER  Technique:  Color and duplex Doppler ultrasound was performed to evaluate the hepatic in-flow and out-flow vessels.  Comparison:  12/15/2009  Portal Vein Velocities: Main:      46 cm/sec Right:      42 cm/sec Left:        12 cm/sec  Hepatic Vein Velocities: Right:     23 cm/sec Middle:  40 cm/sec Left:       44 were cm/sec  Hepatic Artery Velocity:   59 cm/sec Splenic Vein Velocity:      15 cm/sec  Varices:   Not visualized Ascites:   Not present  Findings: Patent portal, hepatic, and splenic veins.  Normal directional flow.  Negative for portal or splenic vein occlusion.  Left portal vein hypoechoic eccentric wall irregularity without occlusion.  Difficult to exclude a subtle amount of nonocclusive left portal vein thrombus.  Again, the main portal vein, right and left portal veins all demonstrate normal directional flow without evidence of significant portal hypertension.  IMPRESSION: Patent portal, hepatic and splenic veins with normal directional flow.  Subtle left portal vein eccentric wall irregularity versus small degree of nonocclusive thrombosis not entirely excluded.  Negative for reversed portal flow or significant portal hypertension by liver Doppler.  Original Report Authenticated By: Judie Petit. Ruel Favors, M.D.   [name] Art/ven Flow Abd Pelv Doppler  04/20/2012  *RADIOLOGY REPORT*  Clinical Data:  Acute liver failure  ABDOMINAL ULTRASOUND COMPLETE  Comparison:  12/15/2009 CT abdomen  Findings:  Gallbladder:  Small echogenic shadowing gallstones noted within the gallbladder.  Gallbladder wall is thickened measuring 9.2 mm. Additionally, there is pericholecystic fluid.  Despite these findings, no elicited Murphy's sign demonstrated.  Findings remain nonspecific.  Cholecystitis not excluded.  Other considerations include chronic  liver disease, hypoproteinemia, or CHF.  Common Bile Duct:  Within normal limits in caliber.  Liver: No focal mass lesion identified.  Within normal limits in parenchymal echogenicity.  IVC:  Appears normal.  Pancreas:  Imaged portion unremarkable.  Entire pancreas not visualize because of obscuring bowel gas.  Spleen:  Within normal limits in size and echotexture.  Right kidney:  11.3 cm length.  Minimal pelvic fullness noted without hydronephrosis.  Small echogenic shadowing calculus in the lower pole measures 7 mm.  Scattered small renal cysts noted, largest inferiorly measuring 2.3 cm.  Left kidney:  Normal in size and parenchymal echogenicity.  No evidence of mass or hydronephrosis.  Abdominal Aorta:  No aneurysm identified.  Small right pleural effusion evident.  IMPRESSION: Cholelithiasis with associated gallbladder wall thickening and surrounding pericholecystic fluid.  Despite these findings no Murphy's sign elicited.  Findings remain indeterminate for cholecystitis versus chronic liver disease, hypoproteinemia, or CHF.  No biliary dilatation  Nonobstructing right nephrolithiasis  Scattered small right renal cysts  Mild right renal pelviectasis  Small right effusion  *RADIOLOGY REPORT*  Clinical Data: Hepatic failure  DUPLEX ULTRASOUND OF LIVER  Technique:  Color and duplex Doppler ultrasound was performed to evaluate the hepatic in-flow and out-flow vessels.  Comparison:  12/15/2009  Portal Vein Velocities: Main:      46 cm/sec Right:      42 cm/sec Left:        12 cm/sec  Hepatic Vein Velocities: Right:     23 cm/sec Middle:  40 cm/sec Left:       44 were cm/sec  Hepatic Artery Velocity:   59 cm/sec Splenic Vein Velocity:      15 cm/sec  Varices:   Not visualized Ascites:   Not present  Findings: Patent portal, hepatic, and splenic veins.  Normal directional flow.  Negative for portal or splenic vein occlusion.  Left portal vein hypoechoic eccentric wall irregularity without occlusion.  Difficult to  exclude a subtle amount of nonocclusive left portal vein thrombus.  Again, the main portal vein, right and left portal veins all demonstrate normal directional flow without evidence of significant portal hypertension.  IMPRESSION: Patent portal, hepatic and splenic veins with normal directional flow.  Subtle left portal vein eccentric wall irregularity versus small degree of nonocclusive thrombosis not entirely excluded.  Negative for reversed portal flow or significant portal hypertension by liver Doppler.  Original Report Authenticated By: Judie Petit. Ruel Favors, M.D.   Dg Chest Port 1 View  04/23/2012  *RADIOLOGY REPORT*  Clinical Data: Respiratory failure.  PORTABLE CHEST - 1 VIEW  Comparison: 04/22/2012.  Findings: Prominent cardiomegaly.  Pericardial effusion not excluded. Pulmonary vascular congestion/pulmonary edema.  Right- sided pleural effusions suspected.  Limited evaluation lung bases with artifact projecting over this region.  Right central line tip mid superior vena cava.  No gross pneumothorax.  Calcified aorta.  IMPRESSION: No significant change.  Please see above.  Original Report Authenticated By: Fuller Canada, M.D.   Dg Chest Port 1 View  04/22/2012  *RADIOLOGY REPORT*  Clinical Data: Respiratory failure.  PORTABLE CHEST - 1 VIEW  Comparison: Chest 04/21/2012.  Findings: Endotracheal tube and NG tube have been removed.  There has been increase in bibasilar airspace disease, particularly on the right, compatible with increased atelectasis.  Small bilateral pleural effusions persist.  There is cardiomegaly.  IMPRESSION:  1.  Some increase in small bilateral pleural effusions and basilar atelectasis. 2.  Status post extubation and NG tube removal.  Original Report Authenticated By: Bernadene Bell. Maricela Curet, M.D.   Dg Chest Port 1 View  04/21/2012  *RADIOLOGY REPORT*  Clinical Data: Intubated patient.  PORTABLE CHEST - 1 VIEW  Comparison: Plain film chest 04/20/2012 and 04/19/2012.  Findings: Support  tubes and lines are unchanged since yesterday's examination.  The patient is rotated on the study.  Airspace disease in the left upper lung zone has worsened.  There has been no change in basilar airspace opacities, worse on the left. Cardiomegaly again seen.  No pneumothorax.  IMPRESSION: Some increase in left upper lung zone airspace opacity with no notable change in basilar airspace disease, worse on the left.  Original Report Authenticated By: Bernadene Bell. Maricela Curet, M.D.   Portable Chest Xray In Am  04/20/2012  *RADIOLOGY REPORT*  Clinical Data: Respiratory failure; assess endotracheal tube placement.  PORTABLE CHEST - 1 VIEW  Comparison: Chest radiograph performed 04/19/2012  Findings: The patient's endotracheal tube is seen ending 3 cm above the carina.  An enteric tube is noted extending below the diaphragm.  A right IJ line is noted ending about the distal SVC.  The lungs remain mildly hypoexpanded.  Vascular congestion is again seen, with bilateral perihilar and left basilar airspace opacities. Findings are most compatible with pulmonary edema, though  pneumonia could have a similar appearance.  A small left pleural effusion is likely present.  No pneumothorax is seen.  The cardiomediastinal silhouette is mildly enlarged.  No acute osseous abnormalities are identified.  IMPRESSION:  1.  Endotracheal tube seen ending 3 cm above the carina. 2.  Lungs mildly hypoexpanded; bilateral airspace opacities are most compatible with pulmonary edema, perhaps mildly worsened, though pneumonia could have a similar appearance. 3.  Mild cardiomegaly; likely small left pleural effusion.  Original Report Authenticated By: Tonia Ghent, M.D.   Dg Chest Port 1 View  04/19/2012  *RADIOLOGY REPORT*  Clinical Data: Central line and endotracheal tube placement.  PORTABLE CHEST - 1 VIEW  Comparison: 04/19/2012, 16 at 2 hours.  Findings: Endotracheal tube and nasogastric tube are present.  The endotracheal tube tip is 22 mm from  the carina. Consolidation of the left base is present.  Airspace disease at the right lung base is also present.  Cardiomegaly.  Pulmonary aeration appears similar to the exam earlier today.  IMPRESSION: 1.  Unchanged support apparatus, in good position. 2.  Left basilar consolidation and small focus of new right basilar airspace disease.  Differential considerations include pneumonia, aspiration and asymmetric/atypical pulmonary edema.  Original Report Authenticated By: Andreas Newport, M.D.   Dg Chest Port 1 View  04/19/2012  *RADIOLOGY REPORT*  Clinical Data: Intubation.  PORTABLE CHEST - 1 VIEW 04/19/2012 1602 hours:  Comparison: Portable chest x-ray earlier same date 1453 hours.  Findings: Endotracheal tube tip in satisfactory position approximately 3 cm above the carina.  Nasogastric tube courses below the diaphragm into the stomach.  Cardiac silhouette enlarged but stable.  Pulmonary venous hypertension without overt edema. Dense consolidation in the left lower lobe, unchanged.  Mild atelectasis at the right base, unchanged.  No new pulmonary parenchymal abnormalities.  IMPRESSION:  1.  Endotracheal tube tip in satisfactory position approximately 3 cm above the carina. 2.  Nasogastric tube courses below the diaphragm into the stomach. 3.  Stable dense left lower lobe atelectasis and/or pneumonia and mild right basilar atelectasis.  Stable cardiomegaly and pulmonary venous hypertension without overt edema.  Original Report Authenticated By: Arnell Sieving, M.D.   Dg Chest Port 1 View  04/19/2012  *RADIOLOGY REPORT*  Clinical Data: Femoral central line placement.  Sepsis.  PORTABLE CHEST - 1 VIEW  Comparison: 01/13/2012.  Findings: Stable enlargement of the cardiac silhouette.  Increased opacity at the left lung base.  Slight increase in prominence of the right hilum with a somewhat rounded configuration.  No adenopathy in that region on the CTA dated 06/29/2011.  Linear density in the left mid lung  zone.  Unremarkable bones.  IMPRESSION:  1.  Increased left basilar opacity compatible with dense pneumonia or atelectasis. 2.  Linear atelectasis in the left mid lung zone. 3.  Possible right hilar adenopathy. 4.  Stable cardiomegaly.  Original Report Authenticated By: Darrol Angel, M.D.   Dg Chest Port 1v Same Day  04/19/2012  *RADIOLOGY REPORT*  Clinical Data: Central venous catheter placement  PORTABLE CHEST - 1 VIEW SAME DAY  Comparison: 1600 hours  Findings: Right internal jugular vein central venous catheter has been placed with its tip at the cavoatrial junction.  No pneumothorax.  Endotracheal and NG tubes are stable.  Bilateral central basilar airspace disease is stable.  IMPRESSION: Right internal jugular vein central venous catheter placed with its tip at the cavoatrial junction and no pneumothorax.  Otherwise stable.  Original Report Authenticated By: Donavan Burnet,  M.D.    Microbiology: Recent Results (from the past 240 hour(s))  CULTURE, BLOOD (ROUTINE X 2)     Status: Normal   Collection Time   04/19/12  3:12 PM      Component Value Range Status Comment   Specimen Description BLOOD RIGHT ARM  3 ML IN [facility] BOTTLE   Final    Special Requests NONE   Final    Culture  Setup Time 04/19/2012 22:25   Final    Culture NO GROWTH 5 DAYS   Final    Report Status 04/25/2012 FINAL   Final   URINE CULTURE     Status: Normal   Collection Time   04/19/12  3:35 PM      Component Value Range Status Comment   Specimen Description URINE, CATHETERIZED   Final    Special Requests NONE   Final    Culture  Setup Time 04/19/2012 22:33   Final    Colony Count NO GROWTH   Final    Culture NO GROWTH   Final    Report Status 04/21/2012 FINAL   Final   MRSA PCR SCREENING     Status: Normal   Collection Time   04/19/12  6:39 PM      Component Value Range Status Comment   MRSA by PCR NEGATIVE  NEGATIVE Final   CULTURE, BLOOD (ROUTINE X 2)     Status: Normal (Preliminary result)   Collection Time    04/20/12  3:30 AM      Component Value Range Status Comment   Specimen Description BLOOD UNKNOWN SITE   Final    Special Requests BOTTLES DRAWN AEROBIC AND ANAEROBIC   Final    Culture  Setup Time 04/20/2012 10:49   Final    Culture     Final    Value:        BLOOD CULTURE RECEIVED NO GROWTH TO DATE CULTURE WILL BE HELD FOR 5 DAYS BEFORE ISSUING A FINAL NEGATIVE REPORT   Report Status PENDING   Incomplete   URINE CULTURE     Status: Normal   Collection Time   04/20/12  3:50 AM      Component Value Range Status Comment   Specimen Description URINE, CATHETERIZED   Final    Special Requests NONE   Final    Culture  Setup Time 04/20/2012 11:19   Final    Colony Count NO GROWTH   Final    Culture NO GROWTH   Final    Report Status 04/21/2012 FINAL   Final   CULTURE, RESPIRATORY     Status: Normal   Collection Time   04/20/12  8:01 AM      Component Value Range Status Comment   Specimen Description TRACHEAL ASPIRATE   Final    Special Requests Normal   Final    Gram Stain     Final    Value: RARE WBC PRESENT, PREDOMINANTLY PMN     NO SQUAMOUS EPITHELIAL CELLS SEEN     NO ORGANISMS SEEN   Culture NO GROWTH 2 DAYS   Final    Report Status 04/22/2012 FINAL   Final      Labs: Basic Metabolic Panel:  Lab 04/25/12 4098 05/06/2012 0400 04/23/12 0500 04/22/12 0400 04/21/12 0445 04/20/12 0330  NA 139 137 139 142 140 --  K 4.6 4.1 4.3 4.6 4.0 --  CL 102 103 105 108 108 --  CO2 28 27 27 22 23  --  GLUCOSE 222*  195* 208* 179* 150* --  BUN 27* 32* 40* 48* 55* --  CREATININE 0.79 0.84 0.91 0.98 1.21* --  CALCIUM 8.8 8.4 8.5 8.6 7.9* --  MG -- -- -- -- -- 2.3  PHOS -- -- -- -- -- 4.7*   Liver Function Tests:  Lab 04/23/12 0500 04/21/12 0445 04/20/12 0330 04/19/12 1451  AST 180* 1015* 2905* 3556*  ALT 797* 1389* 2035* 1907*  ALKPHOS 138* 161* 143* 91  BILITOT 0.4 0.4 0.6 0.3  PROT 6.3 6.0 5.9* 6.0  ALBUMIN 2.0* 1.8* 1.8* 1.8*    Lab 04/20/12 0330  LIPASE 5*  AMYLASE 8   CBC:  Lab  04/25/12 1000 04/22/12 0400 04/21/12 0445 04/20/12 0330 04/19/12 1451  WBC 13.3* 17.8* 15.1* 13.8* 15.6*  NEUTROABS -- -- 13.1* -- 13.1*  HGB 10.3* 10.2* 9.3* 9.0* 6.3*  HCT 34.4* 33.4* 28.5* 28.4* 20.7*  MCV 83.9 81.9 79.4 78.2 78.1  PLT 214 328 248 291 321   Cardiac Enzymes:  Lab 04/22/12 0400 04/20/12 1915 04/20/12 1130 04/20/12 0330 04/19/12 1620  CKTOTAL 53 245* 339* 424* 395*  CKMB 5.8* 15.9* 17.0* 10.8* 1.7  CKMBINDEX -- -- -- -- --  TROPONINI 1.25* 3.79* 3.62* 1.84* <0.30   BNP: BNP (last 3 results)  Basename 2012-05-09 0400 04/22/12 0400 04/19/12 1620  PROBNP 34946.0* 30535.0* 21491.0*   CBG:  Lab 04/25/12 1219 04/25/12 0736 May 09, 2012 2137 2012/05/09 1652 05/09/12 1133  GLUCAP 189* 179* 229* 250* 247*    Time coordinating discharge: Over 30 minutes  Signed:  Debbora Presto, MD  [facility] 04/25/2012, 1:47 PM Pager [phone]  If 7PM-7AM, please contact night-coverage [email or web address] Password [email or web address]

## 2012-04-25 NOTE — Consult Note (Signed)
Patient Holly Kim      DOB: June 14, 1941      DGU:[id number]     Consult Note from the Palliative Medicine Team at [facility]    Consult Requested by: Dr Izola Price     PCP: Lloyd Huger, MD Reason for Consultation:Clarrification of GOC and options     Phone Number:[phone]  Assessment of patients Current state: 71 years old chronically ill female with complex medical history ready to return to SNF after today's meeting   This NP Lorinda Creed reviewed medical records, received report from team, assessed the patient and then meet at the patient's bedside along with her son Holly Kim # [phone]  to discuss diagnosis prognosis, GOC, EOL wishes disposition and options.  A detailed discussion was had today regarding advanced directives.  Concepts specific to code status, artifical feeding and hydration, continued IV antibiotics and rehospitalization was had.  The difference between a aggressive medical intervention path  and a palliative comfort care path for this patient at this time was had.  Values and goals of care important to patient and family were attempted to be elicited.  Concept of Hospice and Palliative Care were discussed  Natural trajectory and expectations at EOL were discussed.  Questions and concerns addressed.     Goals of Care: 1.  Code Status:  Limited code     -no cardioversion, no compression    -desires all other available medical interventions   2. Scope of Treatment:    -as above, pt wishes for all available medical interventions to prolong life   4. Disposition:  Back to SNF-recommend Palliative care services to follow on dc   3. Symptom Management:  1. Overall failure to thrive-patient and son/HPOA are encouraged to continue conversation for advanced directives.  This patient is chronically ill and most likely will be facing this discussion and decisions in the near future.  Hard choices left for review.  2. Anorexia: encouraged to eat six small  meals vs three full size, encouraged hydration    4. Psychosocial: emotional support offered to patient and son at bedside      Brief HPI: chronically ill 71 yr old female with complex medical history, admitted on 04-19-12 for ARF in setting of urosepsis, now stable and ready for dc today, attending hopeful to secure advanced directives with patient and family   ROS: chronic back pain, incontinent of urine and stool, weakness, poor appetite    PMH:  Past Medical History  Diagnosis Date  . PE (pulmonary embolism)   . DVT (deep venous thrombosis)   . Diabetes mellitus   . Asthma   . Hypertension   . CAD (coronary artery disease)     RCA ostial DES 9/11, Circumflex ostial PTCA only 09/11/11.  Marland Kitchen GERD (gastroesophageal reflux disease)   . COPD (chronic obstructive pulmonary disease)   . Obesity   . Tardive dyskinesia   . History of renal failure   . Depression   . Borderline personality disorder   . Anxiety   . Anemia     hx of blood transfusion x 3  . Arthritis   . Peripheral neuropathy   . Shortness of breath     at rest     PSH: Past Surgical History  Procedure Date  . US echocardiography 2008  . Orif ankle fracture   . Appendectomy   . Cervical discectomy   . Ptca 06/2010  . Tonsillectomy   . Transthoracic echocardiogram 06/2010    Grade 1 diastolic, EF  55-60  . Esophagogastroduodenoscopy 01/21/2012    Procedure: ESOPHAGOGASTRODUODENOSCOPY (EGD);  Surgeon: Theda Belfast, MD;  Location: [facility] ENDOSCOPY;  Service: Endoscopy;  Laterality: N/A;   I have reviewed the FH and SH and  If appropriate update it with new information. Allergies  Allergen Reactions  . Ibuprofen     REACTION: "mouth swells up and I can't swallow good"  . Amoxicillin Itching  . Augmentin (Amoxicillin-Pot Clavulanate) Other (See Comments)    unknown  . Codeine Itching  . Cortisone Other (See Comments)    Pt states she gets "blood poisoning where they give me the shots of cortisone"  .  Haloperidol Lactate Other (See Comments)    REACTION: My head gets places before my body  . Meclizine Other (See Comments)    unknown  . Metoclopramide Hcl     Shaking   . Nsaids Other (See Comments)    Tolerates aspirin  . Naproxen Rash    Tolerates aspirin   Scheduled Meds:   . antiseptic oral rinse  15 mL Mouth Rinse QID  . aspirin  81 mg Oral Daily  . atorvastatin  40 mg Oral q1800  . bisoprolol  5 mg Oral Daily  . diltiazem  90 mg Oral Q12H  . feeding supplement  1 Container Oral TID BM  . furosemide  40 mg Oral Daily  . heparin  5,000 Units Subcutaneous Q8H  . insulin aspart  0-15 Units Subcutaneous TID WC  . insulin aspart  0-5 Units Subcutaneous QHS  . insulin glargine  10 Units Subcutaneous QHS  . sodium chloride  10 mL Intravenous Q12H  . spironolactone  25 mg Oral BID   Continuous Infusions:  PRN Meds:.sodium chloride, metoprolol, oxyCODONE    BP 140/87  Pulse 105  Temp 97.7 F (36.5 C) (Oral)  Resp 20  Ht 5\' 5"  (1.651 m)  Wt 132.7 kg (292 lb 8.8 oz)  BMI 48.68 kg/m2  SpO2 98%   PPS: 30%   Intake/Output Summary (Last 24 hours) at 04/25/12 1225 Last data filed at 04/25/12 0800  Gross per 24 hour  Intake    180 ml  Output    575 ml  Net   -395 ml    Physical Exam:  General: chronically ill appearing, NAD HEENT:  Moist mucous membranes Chest:    diminished in bases CVS: irregular, no murmur noted Abdomen: obese, soft NT +BS Ext: BLE +1 edema, discoloration of both feet, reddened, tender touch Neuro:alert and oriented x3  Labs: CBC    Component Value Date/Time   WBC 13.3* 04/25/2012 1000   RBC 4.10 04/25/2012 1000   HGB 10.3* 04/25/2012 1000   HCT 34.4* 04/25/2012 1000   PLT 214 04/25/2012 1000   MCV 83.9 04/25/2012 1000   MCH 25.1* 04/25/2012 1000   MCHC 29.9* 04/25/2012 1000   RDW 19.1* 04/25/2012 1000   LYMPHSABS 1.3 04/21/2012 0445   MONOABS 0.7 04/21/2012 0445   EOSABS 0.1 04/21/2012 0445   BASOSABS 0.0 04/21/2012 0445    BMET      Component Value Date/Time   NA 139 04/25/2012 1000   K 4.6 04/25/2012 1000   CL 102 04/25/2012 1000   CO2 28 04/25/2012 1000   GLUCOSE 222* 04/25/2012 1000   BUN 27* 04/25/2012 1000   CREATININE 0.79 04/25/2012 1000   CREATININE 1.16* 08/29/2011 1458   CALCIUM 8.8 04/25/2012 1000   GFRNONAA 81* 04/25/2012 1000   GFRAA >90 04/25/2012 1000    CMP  Component Value Date/Time   NA 139 04/25/2012 1000   K 4.6 04/25/2012 1000   CL 102 04/25/2012 1000   CO2 28 04/25/2012 1000   GLUCOSE 222* 04/25/2012 1000   BUN 27* 04/25/2012 1000   CREATININE 0.79 04/25/2012 1000   CREATININE 1.16* 08/29/2011 1458   CALCIUM 8.8 04/25/2012 1000   PROT 6.3 04/23/2012 0500   ALBUMIN 2.0* 04/23/2012 0500   AST 180* 04/23/2012 0500   ALT 797* 04/23/2012 0500   ALKPHOS 138* 04/23/2012 0500   BILITOT 0.4 04/23/2012 0500   GFRNONAA 81* 04/25/2012 1000   GFRAA >90 04/25/2012 1000      Time In Time Out Total Time Spent with Patient Total Overall Time  1200 1300 60 min 60 min    Greater than 50%  of this time was spent counseling and coordinating care related to the above assessment and plan.     Lorinda Creed NP [phone]

## 2012-04-26 LAB — CULTURE, BLOOD (ROUTINE X 2): Culture: NO GROWTH

## 2012-05-14 DEATH — deceased

## 2013-01-31 IMAGING — CR DG KNEE COMPLETE 4+V*L*
5 series · 5 of 5 positions shown · non-contrast
Comparison: 07/28/2010

CLINICAL DATA: Fall.  Knee pain.

LEFT KNEE - COMPLETE 4+ VIEW

[x knee lat left]
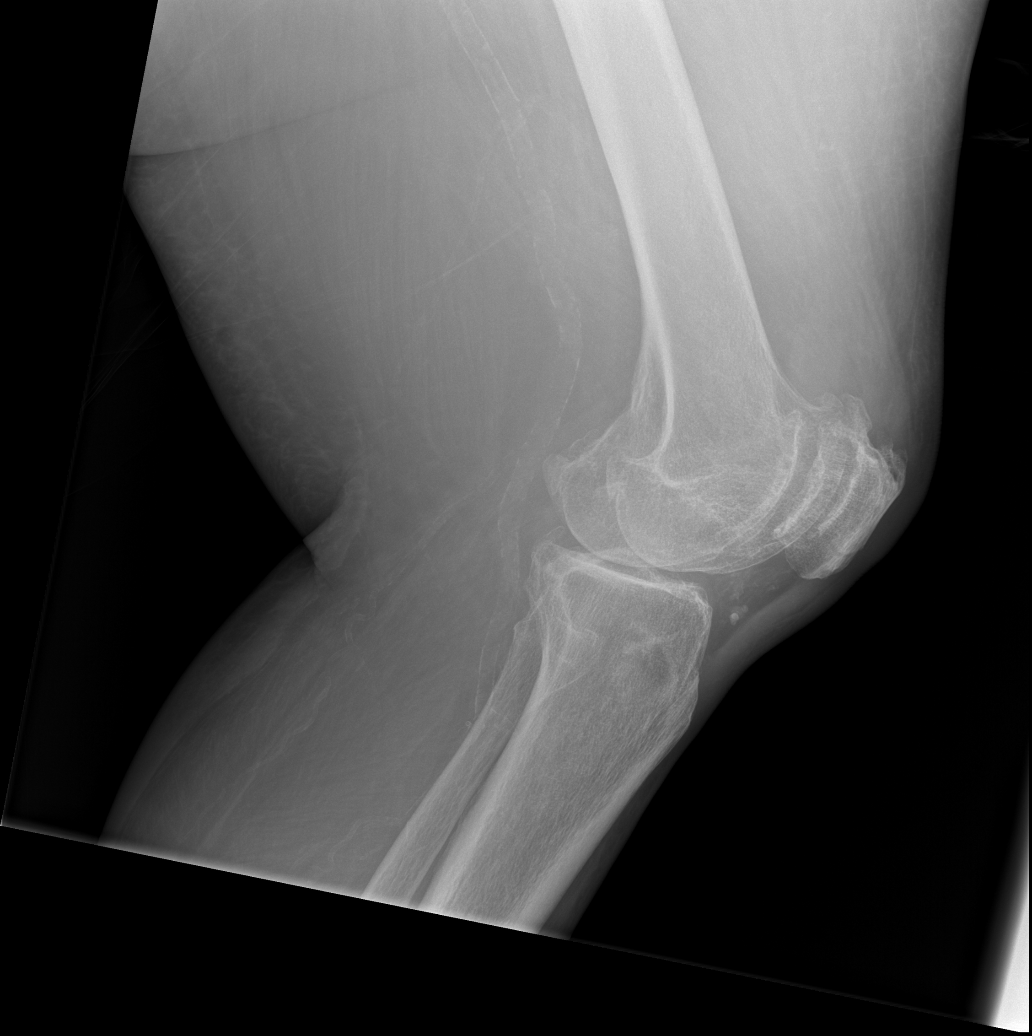

[x knee ap left]
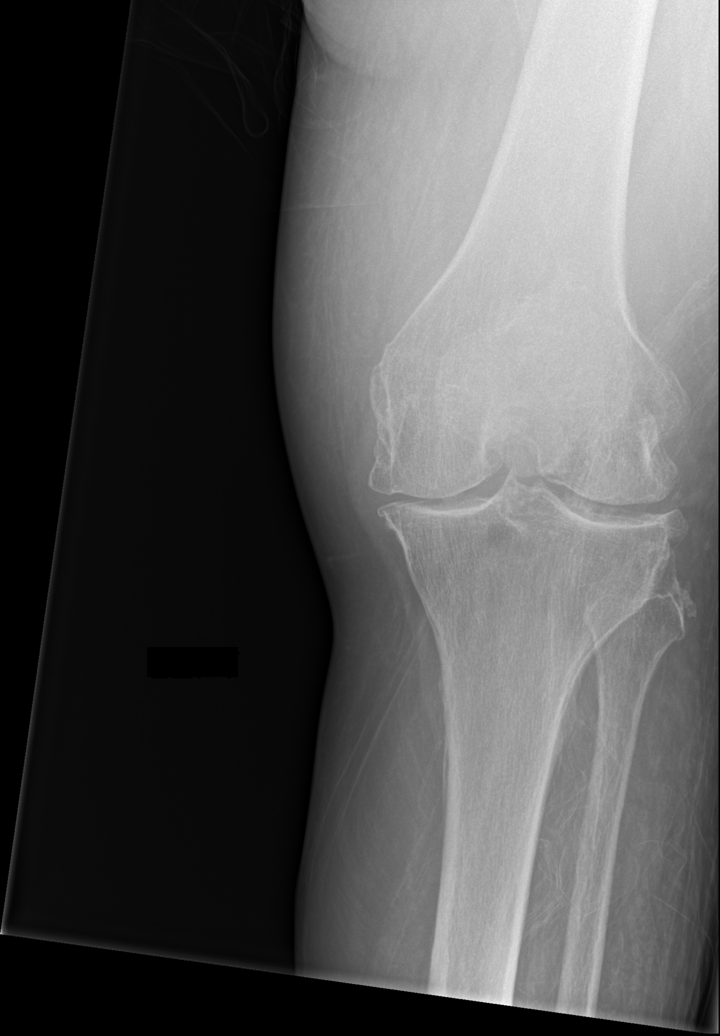

[x knee patella left]
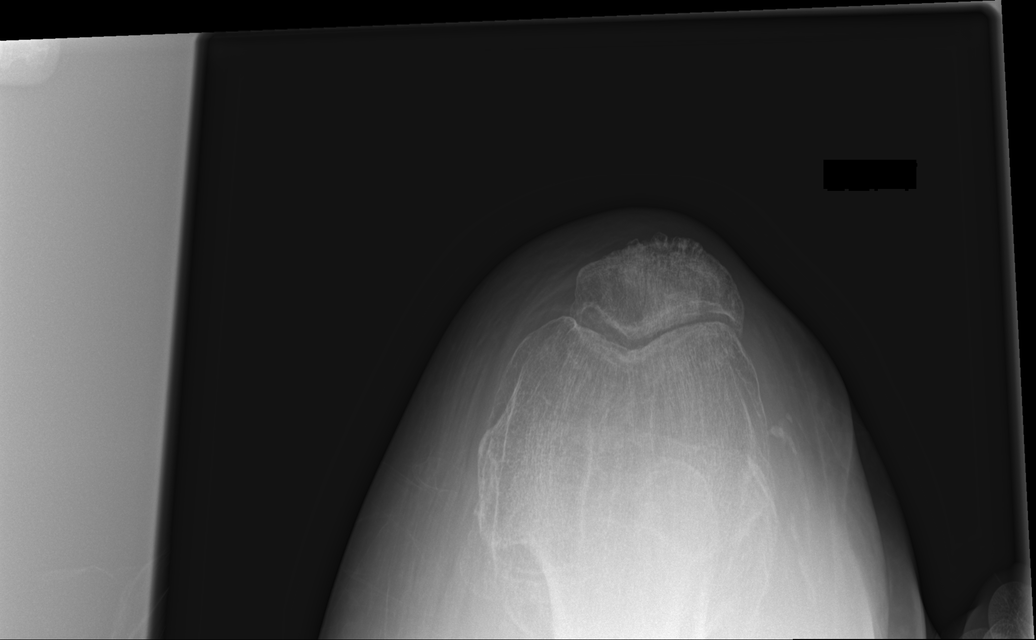

[x knee obl left (1 of 2)]
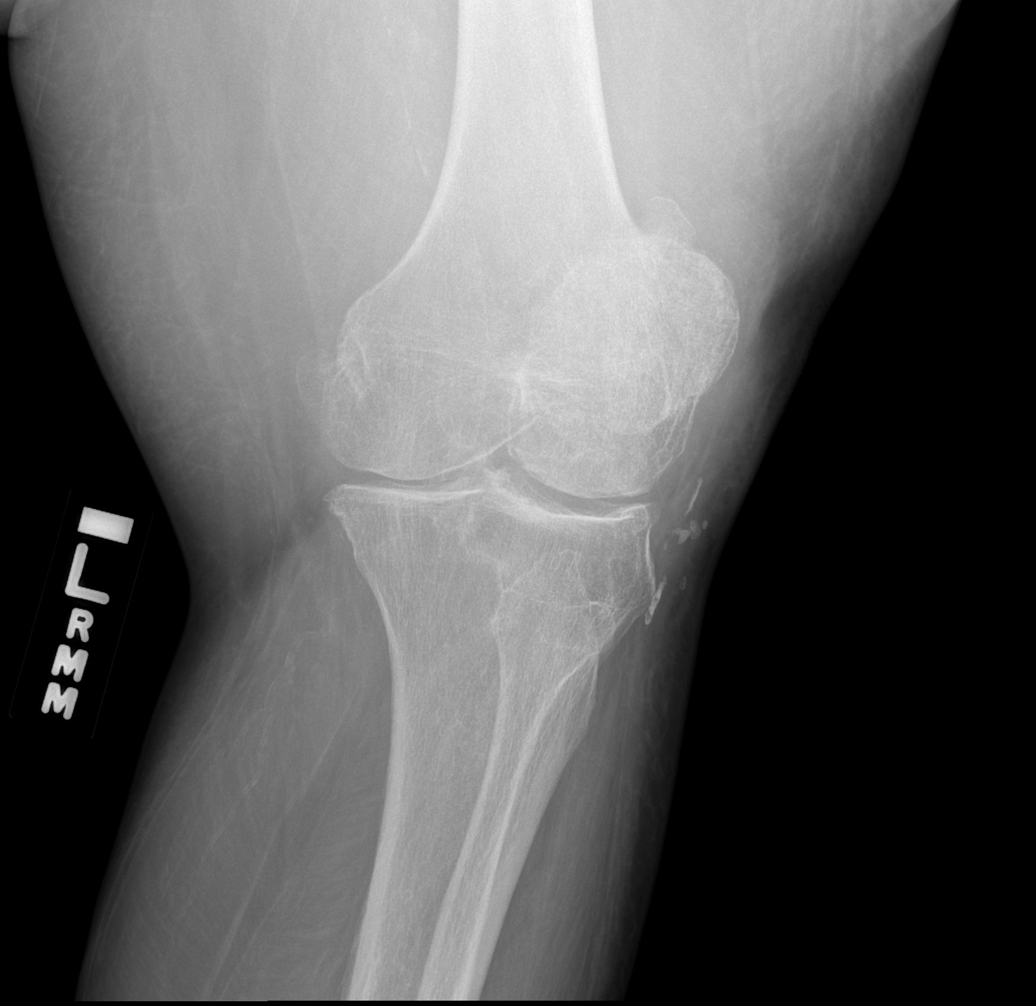

[x knee obl left (2 of 2)]
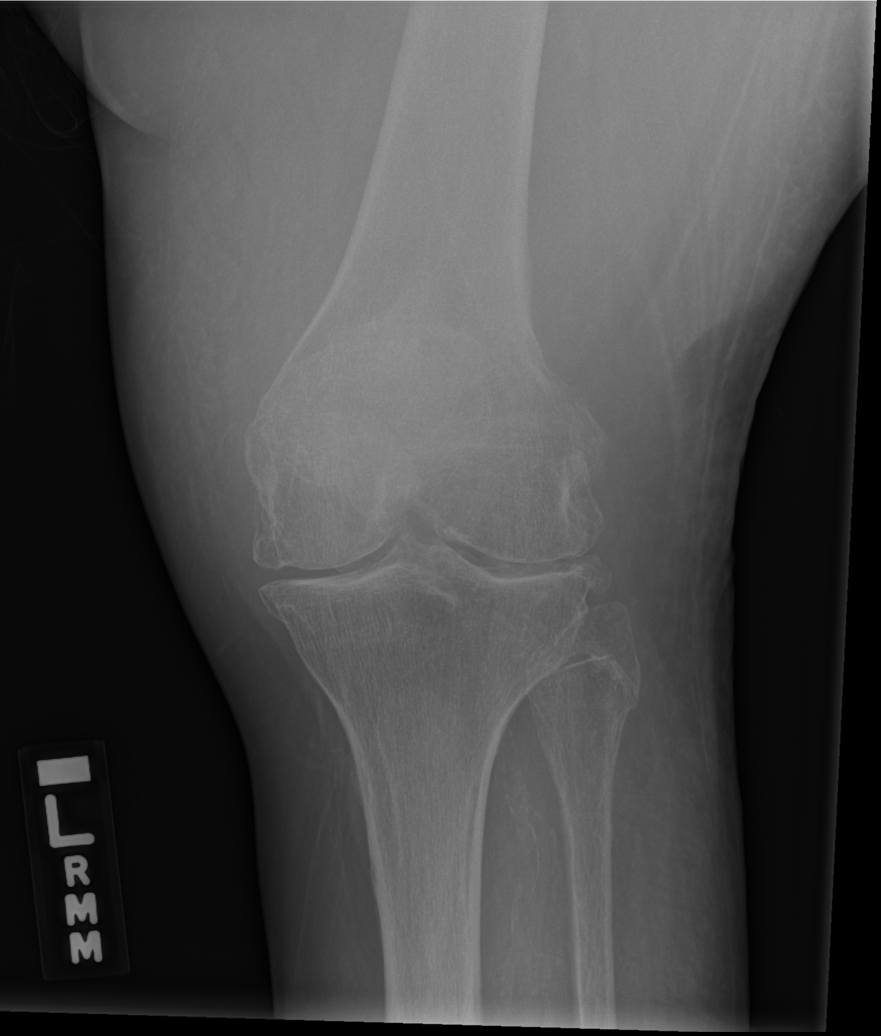

[5 of 5 positions shown; findings below may reference images not displayed]

FINDINGS: Prepatellar subcutaneous edema is present, and subtle
oblique lucency in the patella is not readily apparent on the prior
exam from 4411 and is suspicious for a nondisplaced patellar
fracture.

A small effusion is present the suprapatellar bursa.

Severe patellofemoral spurring is present.  There is also prominent
medial and lateral compartmental osteoarthritis.

Vascular calcifications noted.  Dystrophic calcifications are
visible in the subcutaneous tissues.
IMPRESSION: 1.  Abnormal oblique lucency across the patella is suspicious for a
nondisplaced fracture.
2.  Tri-compartmental osteoarthritis.
3.  Small knee effusion and prepatellar soft tissue swelling.
4.  Atherosclerosis.

## 2013-05-05 IMAGING — CR DG CHEST 1V PORT
1 series · 1 of 1 positions shown · non-contrast
Comparison: 04/22/2012.

CLINICAL DATA: Respiratory failure.

PORTABLE CHEST - 1 VIEW

[AP]
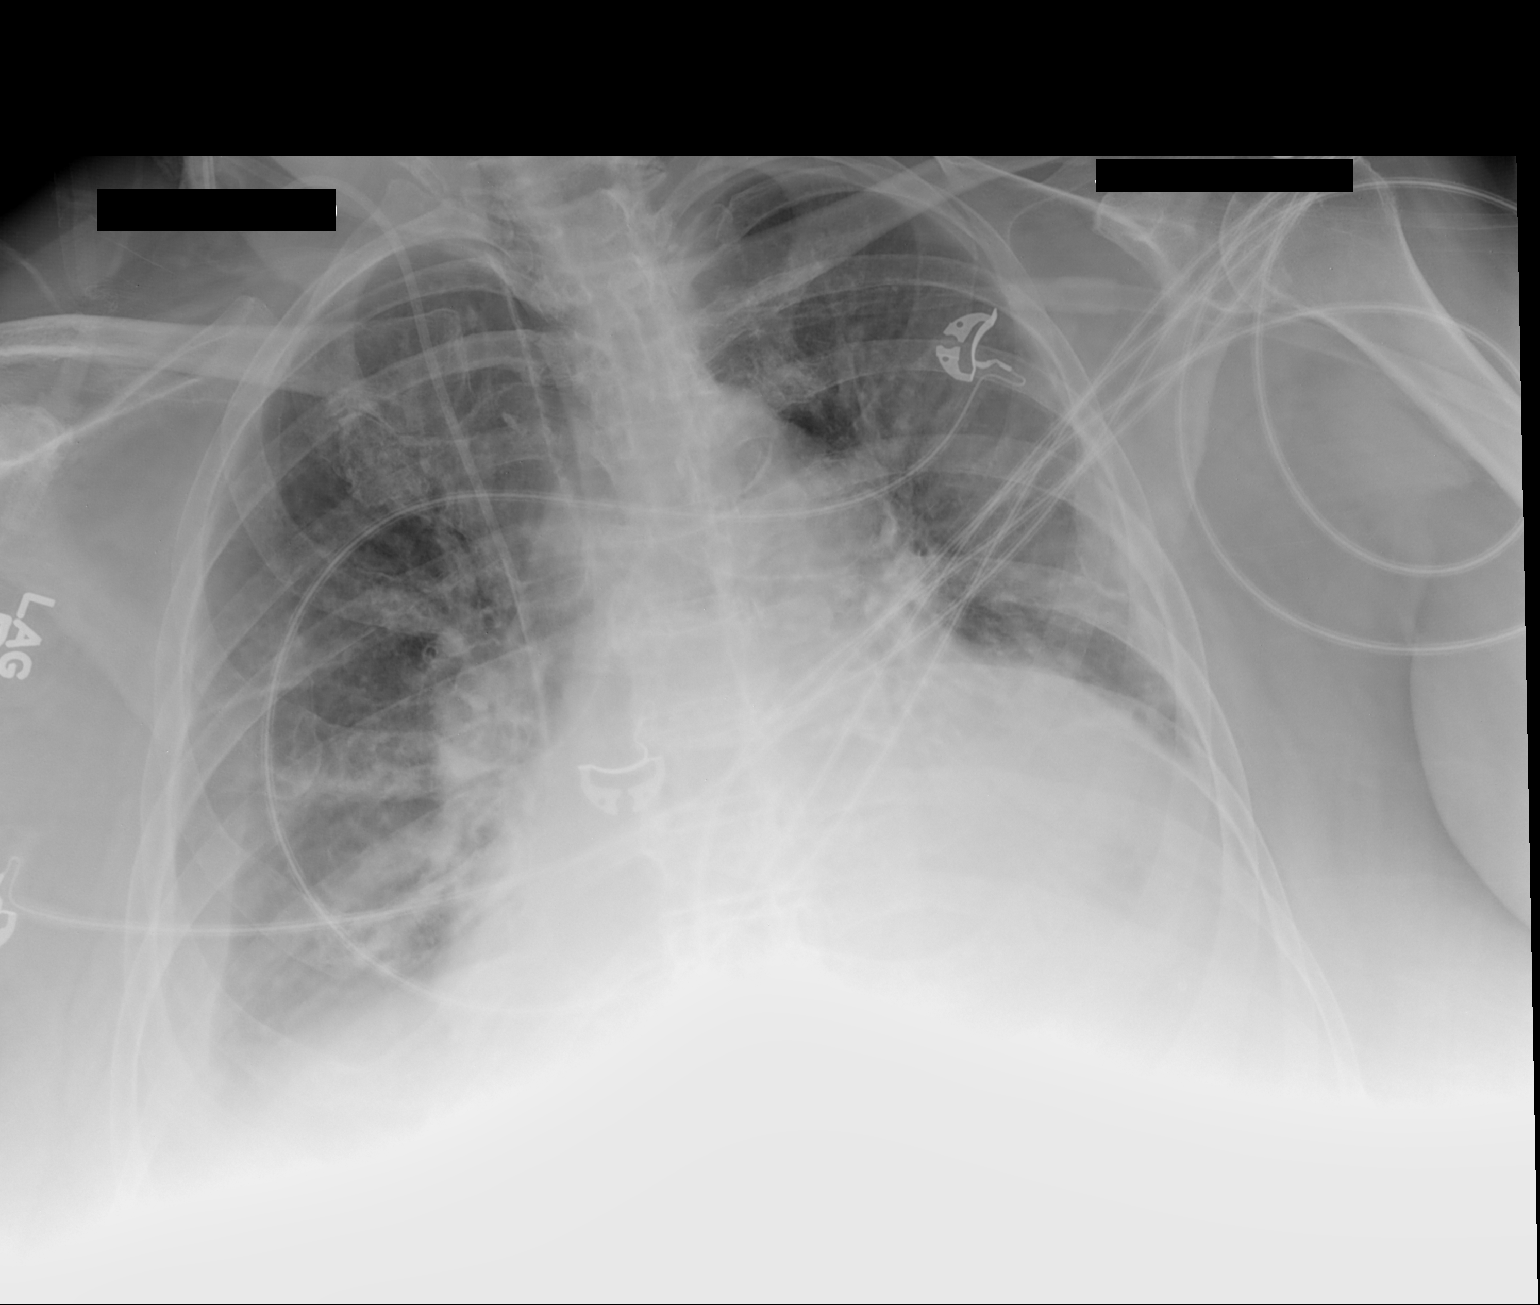

[1 of 1 positions shown; findings below may reference images not displayed]

FINDINGS: Prominent cardiomegaly.  Pericardial effusion not
excluded. Pulmonary vascular congestion/pulmonary edema.  Right-
sided pleural effusions suspected.

Limited evaluation lung bases with artifact projecting over this
region.

Right central line tip mid superior vena cava.  No gross
pneumothorax.

Calcified aorta.
IMPRESSION: No significant change.  Please see above.

## 2013-09-17 ENCOUNTER — Telehealth: Payer: Self-pay | Admitting: *Deleted

## 2013-09-17 NOTE — Telephone Encounter (Signed)
Holly Kim was on my list of patients with diabetes to call to come in for a cholesterol check. Before calling her I realized she had passed away. I'm not sure why she still has an active chart.Busick, Rodena Medin

## 2013-11-15 ENCOUNTER — Telehealth: Payer: Self-pay | Admitting: *Deleted

## 2013-11-15 NOTE — Telephone Encounter (Signed)
Will forward to [place] to close chart. Kearstyn Avitia,CMA

## 2013-11-16 NOTE — Telephone Encounter (Signed)
Updated patients record to reflect that patient has died.

## 2014-09-21 ENCOUNTER — Encounter (HOSPITAL_COMMUNITY): Payer: Self-pay | Admitting: Cardiovascular Disease
